# Patient Record
Sex: Female | Born: 1960 | Race: White | Hispanic: No | Marital: Married | Smoking: Current every day smoker
Health system: Southern US, Community
[De-identification: ages and names within clinical notes are randomized; demographics above are authoritative.]

## PROBLEM LIST (undated history)

## (undated) DIAGNOSIS — Z972 Presence of dental prosthetic device (complete) (partial): Secondary | ICD-10-CM

## (undated) DIAGNOSIS — Z7901 Long term (current) use of anticoagulants: Secondary | ICD-10-CM

## (undated) DIAGNOSIS — K08109 Complete loss of teeth, unspecified cause, unspecified class: Secondary | ICD-10-CM

## (undated) DIAGNOSIS — F319 Bipolar disorder, unspecified: Secondary | ICD-10-CM

## (undated) DIAGNOSIS — E079 Disorder of thyroid, unspecified: Secondary | ICD-10-CM

## (undated) DIAGNOSIS — C801 Malignant (primary) neoplasm, unspecified: Secondary | ICD-10-CM

## (undated) DIAGNOSIS — C50919 Malignant neoplasm of unspecified site of unspecified female breast: Secondary | ICD-10-CM

## (undated) DIAGNOSIS — I890 Lymphedema, not elsewhere classified: Secondary | ICD-10-CM

## (undated) DIAGNOSIS — E119 Type 2 diabetes mellitus without complications: Secondary | ICD-10-CM

## (undated) DIAGNOSIS — I38 Endocarditis, valve unspecified: Secondary | ICD-10-CM

## (undated) DIAGNOSIS — I509 Heart failure, unspecified: Secondary | ICD-10-CM

## (undated) DIAGNOSIS — I10 Essential (primary) hypertension: Secondary | ICD-10-CM

## (undated) DIAGNOSIS — R7989 Other specified abnormal findings of blood chemistry: Secondary | ICD-10-CM

## (undated) DIAGNOSIS — K7460 Unspecified cirrhosis of liver: Secondary | ICD-10-CM

## (undated) DIAGNOSIS — R188 Other ascites: Secondary | ICD-10-CM

## (undated) DIAGNOSIS — E785 Hyperlipidemia, unspecified: Secondary | ICD-10-CM

## (undated) DIAGNOSIS — J449 Chronic obstructive pulmonary disease, unspecified: Secondary | ICD-10-CM

## (undated) DIAGNOSIS — M654 Radial styloid tenosynovitis [de Quervain]: Secondary | ICD-10-CM

## (undated) DIAGNOSIS — R4020 Unspecified coma: Secondary | ICD-10-CM

## (undated) DIAGNOSIS — F1411 Cocaine abuse, in remission: Secondary | ICD-10-CM

## (undated) DIAGNOSIS — F32A Depression, unspecified: Secondary | ICD-10-CM

## (undated) DIAGNOSIS — I429 Cardiomyopathy, unspecified: Secondary | ICD-10-CM

## (undated) DIAGNOSIS — K2100 Gastro-esophageal reflux disease with esophagitis, without bleeding: Secondary | ICD-10-CM

## (undated) DIAGNOSIS — J45909 Unspecified asthma, uncomplicated: Secondary | ICD-10-CM

## (undated) DIAGNOSIS — I5020 Unspecified systolic (congestive) heart failure: Secondary | ICD-10-CM

## (undated) DIAGNOSIS — I4510 Unspecified right bundle-branch block: Secondary | ICD-10-CM

## (undated) DIAGNOSIS — N183 Chronic kidney disease, stage 3 unspecified: Secondary | ICD-10-CM

## (undated) DIAGNOSIS — M1990 Unspecified osteoarthritis, unspecified site: Secondary | ICD-10-CM

## (undated) DIAGNOSIS — G4730 Sleep apnea, unspecified: Secondary | ICD-10-CM

## (undated) DIAGNOSIS — I4819 Other persistent atrial fibrillation: Secondary | ICD-10-CM

## (undated) DIAGNOSIS — I2510 Atherosclerotic heart disease of native coronary artery without angina pectoris: Secondary | ICD-10-CM

## (undated) DIAGNOSIS — F1911 Other psychoactive substance abuse, in remission: Secondary | ICD-10-CM

## (undated) DIAGNOSIS — Z923 Personal history of irradiation: Secondary | ICD-10-CM

## (undated) DIAGNOSIS — I5032 Chronic diastolic (congestive) heart failure: Secondary | ICD-10-CM

## (undated) DIAGNOSIS — R55 Syncope and collapse: Secondary | ICD-10-CM

## (undated) DIAGNOSIS — G4700 Insomnia, unspecified: Secondary | ICD-10-CM

## (undated) DIAGNOSIS — K219 Gastro-esophageal reflux disease without esophagitis: Secondary | ICD-10-CM

## (undated) DIAGNOSIS — I700 Atherosclerosis of aorta: Secondary | ICD-10-CM

## (undated) DIAGNOSIS — F329 Major depressive disorder, single episode, unspecified: Secondary | ICD-10-CM

## (undated) DIAGNOSIS — F1910 Other psychoactive substance abuse, uncomplicated: Secondary | ICD-10-CM

## (undated) DIAGNOSIS — F419 Anxiety disorder, unspecified: Secondary | ICD-10-CM

## (undated) DIAGNOSIS — S0990XA Unspecified injury of head, initial encounter: Secondary | ICD-10-CM

## (undated) DIAGNOSIS — M109 Gout, unspecified: Secondary | ICD-10-CM

## (undated) HISTORY — DX: Other psychoactive substance abuse, uncomplicated: F19.10

## (undated) HISTORY — DX: Disorder of thyroid, unspecified: E07.9

## (undated) HISTORY — PX: TUBOPLASTY / TUBOTUBAL ANASTOMOSIS: SUR1392

## (undated) HISTORY — DX: Syncope and collapse: R55

## (undated) HISTORY — PX: CHOLECYSTECTOMY: SHX55

## (undated) HISTORY — DX: Cardiomyopathy, unspecified: I42.9

## (undated) HISTORY — DX: Heart failure, unspecified: I50.9

## (undated) HISTORY — DX: Other persistent atrial fibrillation: I48.19

## (undated) HISTORY — DX: Unspecified systolic (congestive) heart failure: I50.20

## (undated) HISTORY — DX: Atherosclerotic heart disease of native coronary artery without angina pectoris: I25.10

## (undated) HISTORY — DX: Essential (primary) hypertension: I10

## (undated) HISTORY — PX: TUBAL LIGATION: SHX77

## (undated) HISTORY — DX: Other ascites: R18.8

## (undated) HISTORY — DX: Chronic diastolic (congestive) heart failure: I50.32

## (undated) HISTORY — DX: Malignant (primary) neoplasm, unspecified: C80.1

---
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
## 2007-05-02 ENCOUNTER — Ambulatory Visit: Payer: Self-pay | Admitting: Internal Medicine

## 2007-11-06 ENCOUNTER — Ambulatory Visit: Payer: Self-pay | Admitting: Internal Medicine

## 2009-12-31 ENCOUNTER — Ambulatory Visit: Payer: Self-pay

## 2010-09-07 ENCOUNTER — Ambulatory Visit: Payer: Self-pay | Admitting: Surgery

## 2012-03-16 ENCOUNTER — Ambulatory Visit: Payer: Self-pay | Admitting: Emergency Medicine

## 2013-02-13 ENCOUNTER — Ambulatory Visit: Payer: Self-pay | Admitting: Physician Assistant

## 2013-03-21 ENCOUNTER — Ambulatory Visit: Payer: Self-pay | Admitting: Gastroenterology

## 2013-04-10 ENCOUNTER — Ambulatory Visit: Payer: Self-pay | Admitting: Gastroenterology

## 2013-05-02 ENCOUNTER — Ambulatory Visit: Payer: Self-pay | Admitting: Gastroenterology

## 2014-02-19 DIAGNOSIS — F418 Other specified anxiety disorders: Secondary | ICD-10-CM | POA: Diagnosis present

## 2014-02-19 DIAGNOSIS — K219 Gastro-esophageal reflux disease without esophagitis: Secondary | ICD-10-CM | POA: Insufficient documentation

## 2014-03-01 ENCOUNTER — Ambulatory Visit: Payer: Self-pay | Admitting: Internal Medicine

## 2014-03-15 ENCOUNTER — Ambulatory Visit: Payer: Self-pay | Admitting: Internal Medicine

## 2014-03-21 DIAGNOSIS — R921 Mammographic calcification found on diagnostic imaging of breast: Secondary | ICD-10-CM | POA: Insufficient documentation

## 2014-08-26 ENCOUNTER — Other Ambulatory Visit: Payer: Self-pay | Admitting: Internal Medicine

## 2014-08-26 DIAGNOSIS — R921 Mammographic calcification found on diagnostic imaging of breast: Secondary | ICD-10-CM

## 2015-01-14 ENCOUNTER — Ambulatory Visit
Admission: RE | Admit: 2015-01-14 | Discharge: 2015-01-14 | Disposition: A | Discharge status: Home / Self Care | Payer: 59 | Source: Ambulatory Visit | Attending: Internal Medicine | Admitting: Internal Medicine

## 2015-01-14 ENCOUNTER — Other Ambulatory Visit: Payer: Self-pay | Admitting: Internal Medicine

## 2015-01-14 DIAGNOSIS — R921 Mammographic calcification found on diagnostic imaging of breast: Secondary | ICD-10-CM

## 2015-02-28 ENCOUNTER — Other Ambulatory Visit: Payer: Self-pay | Admitting: Internal Medicine

## 2015-02-28 DIAGNOSIS — R921 Mammographic calcification found on diagnostic imaging of breast: Secondary | ICD-10-CM

## 2015-04-04 DIAGNOSIS — G4733 Obstructive sleep apnea (adult) (pediatric): Secondary | ICD-10-CM

## 2015-06-17 ENCOUNTER — Other Ambulatory Visit: Payer: Self-pay | Admitting: Nurse Practitioner

## 2015-06-17 DIAGNOSIS — R1013 Epigastric pain: Secondary | ICD-10-CM

## 2015-06-17 DIAGNOSIS — K219 Gastro-esophageal reflux disease without esophagitis: Secondary | ICD-10-CM

## 2015-06-19 ENCOUNTER — Ambulatory Visit
Admission: RE | Admit: 2015-06-19 | Discharge: 2015-06-19 | Disposition: A | Discharge status: Home / Self Care | Payer: Commercial Managed Care - HMO | Source: Ambulatory Visit | Attending: Nurse Practitioner | Admitting: Nurse Practitioner

## 2015-06-19 DIAGNOSIS — K219 Gastro-esophageal reflux disease without esophagitis: Secondary | ICD-10-CM | POA: Insufficient documentation

## 2015-06-19 DIAGNOSIS — R109 Unspecified abdominal pain: Secondary | ICD-10-CM | POA: Diagnosis present

## 2015-06-19 DIAGNOSIS — R161 Splenomegaly, not elsewhere classified: Secondary | ICD-10-CM | POA: Diagnosis not present

## 2015-06-19 DIAGNOSIS — J90 Pleural effusion, not elsewhere classified: Secondary | ICD-10-CM | POA: Insufficient documentation

## 2015-06-19 DIAGNOSIS — R1013 Epigastric pain: Secondary | ICD-10-CM

## 2015-06-19 HISTORY — DX: Unspecified asthma, uncomplicated: J45.909

## 2015-06-19 MED ORDER — IOPAMIDOL (ISOVUE-300) INJECTION 61%
100.0000 mL | Freq: Once | INTRAVENOUS | Status: AC | PRN
  Administered 2015-06-19: 100 mL via INTRAVENOUS

## 2015-06-27 ENCOUNTER — Encounter: Payer: Self-pay | Admitting: *Deleted

## 2015-07-08 NOTE — Discharge Instructions (Signed)

## 2015-07-09 ENCOUNTER — Ambulatory Visit
Admission: RE | Admit: 2015-07-09 | Discharge: 2015-07-09 | Disposition: A | Discharge status: Home / Self Care | Payer: Commercial Managed Care - HMO | Source: Ambulatory Visit | Attending: Gastroenterology | Admitting: Gastroenterology

## 2015-07-09 ENCOUNTER — Ambulatory Visit: Payer: Commercial Managed Care - HMO | Admitting: Anesthesiology

## 2015-07-09 ENCOUNTER — Encounter
Admission: RE | Disposition: A | Discharge status: Home / Self Care | Payer: Self-pay | Source: Ambulatory Visit | Attending: Gastroenterology

## 2015-07-09 DIAGNOSIS — Z79899 Other long term (current) drug therapy: Secondary | ICD-10-CM | POA: Diagnosis not present

## 2015-07-09 DIAGNOSIS — G4733 Obstructive sleep apnea (adult) (pediatric): Secondary | ICD-10-CM | POA: Diagnosis not present

## 2015-07-09 DIAGNOSIS — R1013 Epigastric pain: Secondary | ICD-10-CM | POA: Insufficient documentation

## 2015-07-09 DIAGNOSIS — F17210 Nicotine dependence, cigarettes, uncomplicated: Secondary | ICD-10-CM | POA: Insufficient documentation

## 2015-07-09 DIAGNOSIS — M1990 Unspecified osteoarthritis, unspecified site: Secondary | ICD-10-CM | POA: Diagnosis not present

## 2015-07-09 DIAGNOSIS — Z7951 Long term (current) use of inhaled steroids: Secondary | ICD-10-CM | POA: Insufficient documentation

## 2015-07-09 DIAGNOSIS — K210 Gastro-esophageal reflux disease with esophagitis: Secondary | ICD-10-CM | POA: Insufficient documentation

## 2015-07-09 DIAGNOSIS — E785 Hyperlipidemia, unspecified: Secondary | ICD-10-CM | POA: Insufficient documentation

## 2015-07-09 DIAGNOSIS — J449 Chronic obstructive pulmonary disease, unspecified: Secondary | ICD-10-CM | POA: Insufficient documentation

## 2015-07-09 DIAGNOSIS — Z7982 Long term (current) use of aspirin: Secondary | ICD-10-CM | POA: Diagnosis not present

## 2015-07-09 DIAGNOSIS — F319 Bipolar disorder, unspecified: Secondary | ICD-10-CM | POA: Diagnosis not present

## 2015-07-09 HISTORY — PX: ESOPHAGOGASTRODUODENOSCOPY: SHX5428

## 2015-07-09 HISTORY — DX: Bipolar disorder, unspecified: F31.9

## 2015-07-09 HISTORY — DX: Presence of dental prosthetic device (complete) (partial): Z97.2

## 2015-07-09 HISTORY — DX: Depression, unspecified: F32.A

## 2015-07-09 HISTORY — DX: Chronic obstructive pulmonary disease, unspecified: J44.9

## 2015-07-09 HISTORY — DX: Anxiety disorder, unspecified: F41.9

## 2015-07-09 HISTORY — DX: Hyperlipidemia, unspecified: E78.5

## 2015-07-09 HISTORY — DX: Unspecified osteoarthritis, unspecified site: M19.90

## 2015-07-09 HISTORY — DX: Major depressive disorder, single episode, unspecified: F32.9

## 2015-07-09 HISTORY — DX: Gastro-esophageal reflux disease without esophagitis: K21.9

## 2015-07-09 HISTORY — DX: Other psychoactive substance abuse, in remission: F19.11

## 2015-07-09 HISTORY — DX: Sleep apnea, unspecified: G47.30

## 2015-07-09 HISTORY — DX: Unspecified coma: R40.20

## 2015-07-09 HISTORY — DX: Unspecified injury of head, initial encounter: S09.90XA

## 2015-07-09 SURGERY — EGD (ESOPHAGOGASTRODUODENOSCOPY)
Anesthesia: Monitor Anesthesia Care | Wound class: Clean Contaminated

## 2015-07-09 MED ORDER — GLYCOPYRROLATE 0.2 MG/ML IJ SOLN
INTRAMUSCULAR | Status: DC | PRN
  Administered 2015-07-09: 0.1 mg via INTRAVENOUS

## 2015-07-09 MED ORDER — PROPOFOL 10 MG/ML IV BOLUS
INTRAVENOUS | Status: DC | PRN
  Administered 2015-07-09: 100 mg via INTRAVENOUS
  Administered 2015-07-09 (×2): 50 mg via INTRAVENOUS

## 2015-07-09 MED ORDER — LIDOCAINE HCL (CARDIAC) 20 MG/ML IV SOLN
INTRAVENOUS | Status: DC | PRN
  Administered 2015-07-09: 40 mg via INTRAVENOUS

## 2015-07-09 MED ORDER — STERILE WATER FOR IRRIGATION IR SOLN
Status: DC | PRN
  Administered 2015-07-09: 100 mL

## 2015-07-09 MED ORDER — ACETAMINOPHEN 325 MG PO TABS
650.0000 mg | ORAL_TABLET | Freq: Once | ORAL | Status: AC
  Administered 2015-07-09: 650 mg via ORAL

## 2015-07-09 MED ORDER — SODIUM CHLORIDE 0.9 % IV SOLN: INTRAVENOUS | Status: DC

## 2015-07-09 MED ORDER — LACTATED RINGERS IV SOLN
INTRAVENOUS | Status: DC
  Administered 2015-07-09: 08:00:00 via INTRAVENOUS

## 2015-07-09 SURGICAL SUPPLY — 41 items
BALLN DILATOR 10-12 8 (BALLOONS)
BALLN DILATOR 12-15 8 (BALLOONS)
BALLN DILATOR 15-18 8 (BALLOONS)
BALLN DILATOR CRE 0-12 8 (BALLOONS)
BALLN DILATOR ESOPH 8 10 CRE (MISCELLANEOUS) IMPLANT
BALLOON DILATOR 12-15 8 (BALLOONS) IMPLANT
BALLOON DILATOR 15-18 8 (BALLOONS) IMPLANT
BALLOON DILATOR CRE 0-12 8 (BALLOONS) IMPLANT
BLOCK BITE 60FR ADLT L/F GRN (MISCELLANEOUS) ×3 IMPLANT
CANISTER SUCT 1200ML W/VALVE (MISCELLANEOUS) ×3 IMPLANT
FCP ESCP3.2XJMB 240X2.8X (MISCELLANEOUS) ×1
FORCEPS BIOP RAD 4 LRG CAP 4 (CUTTING FORCEPS) IMPLANT
FORCEPS BIOP RJ4 240 W/NDL (MISCELLANEOUS) ×2
FORCEPS ESCP3.2XJMB 240X2.8X (MISCELLANEOUS) ×1 IMPLANT
GOWN CVR UNV OPN BCK APRN NK (MISCELLANEOUS) ×1 IMPLANT
GOWN ISOL THUMB LOOP REG UNIV (MISCELLANEOUS) ×2
GOWN STRL REUS W/ TWL LRG LVL3 (GOWN DISPOSABLE) ×1 IMPLANT
GOWN STRL REUS W/TWL LRG LVL3 (GOWN DISPOSABLE) ×2
HEMOCLIP INSTINCT (CLIP) IMPLANT
INJECTOR VARIJECT VIN23 (MISCELLANEOUS) IMPLANT
KIT CO2 TUBING (TUBING) IMPLANT
KIT DEFENDO VALVE AND CONN (KITS) IMPLANT
KIT ENDO PROCEDURE OLY (KITS) ×3 IMPLANT
LIGATOR MULTIBAND 6SHOOTER MBL (MISCELLANEOUS) IMPLANT
MARKER SPOT ENDO TATTOO 5ML (MISCELLANEOUS) IMPLANT
PAD GROUND ADULT SPLIT (MISCELLANEOUS) IMPLANT
SNARE SHORT THROW 13M SML OVAL (MISCELLANEOUS) IMPLANT
SNARE SHORT THROW 30M LRG OVAL (MISCELLANEOUS) IMPLANT
SPOT EX ENDOSCOPIC TATTOO (MISCELLANEOUS)
SUCTION POLY TRAP 4CHAMBER (MISCELLANEOUS) IMPLANT
SYR INFLATION 60ML (SYRINGE) IMPLANT
TRAP SUCTION POLY (MISCELLANEOUS) IMPLANT
TUBING CONN 6MMX3.1M (TUBING)
TUBING SUCTION CONN 0.25 STRL (TUBING) IMPLANT
UNDERPAD 30X60 958B10 (PK) (MISCELLANEOUS) IMPLANT
VALVE BIOPSY ENDO (VALVE) IMPLANT
VARIJECT INJECTOR VIN23 (MISCELLANEOUS)
WATER AUXILLARY (MISCELLANEOUS) IMPLANT
WATER STERILE IRR 250ML POUR (IV SOLUTION) ×3 IMPLANT
WATER STERILE IRR 500ML POUR (IV SOLUTION) IMPLANT
WIRE CRE 18-20MM 8CM F G (MISCELLANEOUS) IMPLANT

## 2015-07-09 NOTE — Anesthesia Procedure Notes (Signed)
Procedure Name: MAC Performed by: Lakesha Levinson Pre-anesthesia Checklist: Patient identified, Emergency Drugs available, Suction available, Patient being monitored and Timeout performed Patient Re-evaluated:Patient Re-evaluated prior to inductionOxygen Delivery Method: Nasal cannula       

## 2015-07-09 NOTE — Op Note (Signed)
[facility] Gastroenterology Patient Name: Ariana Riggs Procedure Date: 07/09/2015 8:22 AM MRN: [id number] Account #: [id number] Date of Birth: 11/02/1960 Admit Type: Outpatient Age: 55 Room: [facility] OR ROOM 01 Gender: Female Note Status: Finalized Procedure:            Upper GI endoscopy Indications:          Epigastric abdominal pain, Follow-up of reflux                        esophagitis Providers:            Lupita Dawn. Candace Cruise, MD Referring MD:         Christena Flake. Raechel Ache, MD (Referring MD) Medicines:            Monitored Anesthesia Care Complications:        No immediate complications. Procedure:            Pre-Anesthesia Assessment:                       - Prior to the procedure, a History and Physical was                        performed, and patient medications, allergies and                        sensitivities were reviewed. The patient's tolerance of                        previous anesthesia was reviewed.                       - The risks and benefits of the procedure and the                        sedation options and risks were discussed with the                        patient. All questions were answered and informed                        consent was obtained.                       - After reviewing the risks and benefits, the patient                        was deemed in satisfactory condition to undergo the                        procedure.                       After obtaining informed consent, the endoscope was                        passed under direct vision. Throughout the procedure,                        the patient's blood pressure, pulse, and oxygen  saturations were monitored continuously. The Olympus                        GIF H180J colonscope [id number]) was introduced                        through the mouth, and advanced to the second part of                        duodenum. The upper GI endoscopy was accomplished               without difficulty. The patient tolerated the procedure                        well. Findings:      The examined esophagus was normal. Biopsies were taken with a cold       forceps for histology.      The entire examined stomach was normal.      The examined duodenum was normal. Impression:           - Normal esophagus. Biopsied.                       - Normal stomach.                       - Normal examined duodenum. Recommendation:       - Discharge patient to home.                       - Observe patient's clinical course.                       - Continue present medications.                       - The findings and recommendations were discussed with                        the patient's family.                       - Await pathology results. Procedure Code(s):    --- Professional ---                       [phone], Esophagogastroduodenoscopy, flexible, transoral;                        with biopsy, single or multiple Diagnosis Code(s):    --- Professional ---                       R10.13, Epigastric pain                       K21.0, Gastro-esophageal reflux disease with esophagitis CPT copyright 2016 American Medical Association. All rights reserved. The codes documented in this report are preliminary and upon coder review may  be revised to meet current compliance requirements. Hulen Luster, MD 07/09/2015 8:29:35 AM This report has been signed electronically. Number of Addenda: 0 Note Initiated On: 07/09/2015 8:22 AM      [facility]

## 2015-07-09 NOTE — H&P (Signed)
  Date of Initial H&P: 06/17/2015  History reviewed, patient examined, no change in status, stable for surgery.

## 2015-07-09 NOTE — Transfer of Care (Signed)
Immediate Anesthesia Transfer of Care Note  Patient: Ariana Riggs  Procedure(s) Performed: Procedure(s) with comments: ESOPHAGOGASTRODUODENOSCOPY (EGD) (N/A) - CPAP  Patient Location: PACU  Anesthesia Type: MAC  Level of Consciousness: awake, alert  and patient cooperative  Airway and Oxygen Therapy: Patient Spontanous Breathing and Patient connected to supplemental oxygen  Post-op Assessment: Post-op Vital signs reviewed, Patient's Cardiovascular Status Stable, Respiratory Function Stable, Patent Airway and No signs of Nausea or vomiting  Post-op Vital Signs: Reviewed and stable  Complications: No apparent anesthesia complications

## 2015-07-09 NOTE — Anesthesia Preprocedure Evaluation (Signed)
Anesthesia Evaluation    Airway Mallampati: II  TM Distance: >3 FB Neck ROM: Full    Dental no notable dental hx. (+) Upper Dentures, Lower Dentures   Pulmonary shortness of breath, asthma , sleep apnea , COPD, Current Smoker,  No home oxygen   Pulmonary exam normal breath sounds clear to auscultation       Cardiovascular Normal cardiovascular exam Rhythm:Regular Rate:Normal  hyperlipidemia   Neuro/Psych Bipolar Disorder    GI/Hepatic GERD  ,  Endo/Other    Renal/GU      Musculoskeletal  (+) Arthritis ,   Abdominal   Peds  Hematology   Anesthesia Other Findings   Reproductive/Obstetrics                             Anesthesia Physical Anesthesia Plan  ASA: III  Anesthesia Plan: MAC   Post-op Pain Management:    Induction: Intravenous  Airway Management Planned:   Additional Equipment:   Intra-op Plan:   Post-operative Plan: Extubation in OR  Informed Consent: I have reviewed the patients History and Physical, chart, labs and discussed the procedure including the risks, benefits and alternatives for the proposed anesthesia with the patient or authorized representative who has indicated his/her understanding and acceptance.   Dental advisory given  Plan Discussed with: CRNA  Anesthesia Plan Comments:         Anesthesia Quick Evaluation

## 2015-07-09 NOTE — Anesthesia Postprocedure Evaluation (Signed)
Anesthesia Post Note  Patient: Ariana Riggs  Procedure(s) Performed: Procedure(s) (LRB): ESOPHAGOGASTRODUODENOSCOPY (EGD) (N/A)  Patient location during evaluation: PACU Anesthesia Type: MAC Level of consciousness: awake and alert Pain management: pain level controlled Vital Signs Assessment: post-procedure vital signs reviewed and stable Respiratory status: spontaneous breathing, nonlabored ventilation, respiratory function stable and patient connected to nasal cannula oxygen Cardiovascular status: stable and blood pressure returned to baseline Anesthetic complications: no    Keirston Saephanh C

## 2015-07-10 ENCOUNTER — Encounter: Payer: Self-pay | Admitting: Gastroenterology

## 2015-07-11 LAB — SURGICAL PATHOLOGY

## 2016-02-26 DIAGNOSIS — G4733 Obstructive sleep apnea (adult) (pediatric): Secondary | ICD-10-CM | POA: Diagnosis not present

## 2016-03-09 ENCOUNTER — Other Ambulatory Visit: Payer: Self-pay | Admitting: Internal Medicine

## 2016-03-09 DIAGNOSIS — Z23 Encounter for immunization: Secondary | ICD-10-CM | POA: Diagnosis not present

## 2016-03-09 DIAGNOSIS — Z79899 Other long term (current) drug therapy: Secondary | ICD-10-CM | POA: Diagnosis not present

## 2016-03-09 DIAGNOSIS — R921 Mammographic calcification found on diagnostic imaging of breast: Secondary | ICD-10-CM

## 2016-03-09 DIAGNOSIS — E782 Mixed hyperlipidemia: Secondary | ICD-10-CM | POA: Diagnosis not present

## 2016-03-24 DIAGNOSIS — R9431 Abnormal electrocardiogram [ECG] [EKG]: Secondary | ICD-10-CM | POA: Diagnosis not present

## 2016-03-30 ENCOUNTER — Other Ambulatory Visit: Payer: Commercial Managed Care - HMO

## 2016-03-30 ENCOUNTER — Ambulatory Visit: Payer: Commercial Managed Care - HMO | Attending: Internal Medicine

## 2016-09-07 DIAGNOSIS — K219 Gastro-esophageal reflux disease without esophagitis: Secondary | ICD-10-CM | POA: Diagnosis not present

## 2016-09-07 DIAGNOSIS — R7309 Other abnormal glucose: Secondary | ICD-10-CM | POA: Diagnosis not present

## 2016-09-07 DIAGNOSIS — J449 Chronic obstructive pulmonary disease, unspecified: Secondary | ICD-10-CM | POA: Diagnosis not present

## 2016-09-07 DIAGNOSIS — E782 Mixed hyperlipidemia: Secondary | ICD-10-CM | POA: Diagnosis not present

## 2016-09-07 DIAGNOSIS — Z79899 Other long term (current) drug therapy: Secondary | ICD-10-CM | POA: Diagnosis not present

## 2016-09-21 DIAGNOSIS — G4733 Obstructive sleep apnea (adult) (pediatric): Secondary | ICD-10-CM | POA: Diagnosis not present

## 2016-10-07 ENCOUNTER — Other Ambulatory Visit: Payer: Self-pay | Admitting: Specialist

## 2016-10-07 DIAGNOSIS — J449 Chronic obstructive pulmonary disease, unspecified: Secondary | ICD-10-CM | POA: Diagnosis not present

## 2016-10-07 DIAGNOSIS — R0602 Shortness of breath: Secondary | ICD-10-CM | POA: Diagnosis not present

## 2016-10-07 DIAGNOSIS — R05 Cough: Secondary | ICD-10-CM | POA: Diagnosis not present

## 2016-10-07 DIAGNOSIS — J849 Interstitial pulmonary disease, unspecified: Secondary | ICD-10-CM

## 2016-10-07 DIAGNOSIS — G4733 Obstructive sleep apnea (adult) (pediatric): Secondary | ICD-10-CM | POA: Diagnosis not present

## 2016-10-14 ENCOUNTER — Ambulatory Visit
Admission: RE | Admit: 2016-10-14 | Discharge: 2016-10-14 | Disposition: A | Discharge status: Home / Self Care | Payer: Commercial Managed Care - HMO | Source: Ambulatory Visit | Attending: Specialist | Admitting: Specialist

## 2016-10-14 DIAGNOSIS — R590 Localized enlarged lymph nodes: Secondary | ICD-10-CM | POA: Diagnosis not present

## 2016-10-14 DIAGNOSIS — J432 Centrilobular emphysema: Secondary | ICD-10-CM | POA: Insufficient documentation

## 2016-10-14 DIAGNOSIS — R161 Splenomegaly, not elsewhere classified: Secondary | ICD-10-CM | POA: Diagnosis not present

## 2016-10-14 DIAGNOSIS — J439 Emphysema, unspecified: Secondary | ICD-10-CM | POA: Diagnosis not present

## 2016-10-14 DIAGNOSIS — I700 Atherosclerosis of aorta: Secondary | ICD-10-CM | POA: Insufficient documentation

## 2016-10-14 DIAGNOSIS — J849 Interstitial pulmonary disease, unspecified: Secondary | ICD-10-CM | POA: Diagnosis not present

## 2016-10-14 DIAGNOSIS — I2510 Atherosclerotic heart disease of native coronary artery without angina pectoris: Secondary | ICD-10-CM | POA: Diagnosis not present

## 2016-10-14 DIAGNOSIS — R0602 Shortness of breath: Secondary | ICD-10-CM | POA: Diagnosis not present

## 2016-10-25 ENCOUNTER — Other Ambulatory Visit: Payer: Self-pay | Admitting: Specialist

## 2016-10-25 DIAGNOSIS — J849 Interstitial pulmonary disease, unspecified: Secondary | ICD-10-CM

## 2016-12-21 DIAGNOSIS — Z23 Encounter for immunization: Secondary | ICD-10-CM | POA: Diagnosis not present

## 2017-04-19 ENCOUNTER — Other Ambulatory Visit: Payer: Self-pay | Admitting: Internal Medicine

## 2017-04-19 DIAGNOSIS — R921 Mammographic calcification found on diagnostic imaging of breast: Secondary | ICD-10-CM

## 2017-04-19 DIAGNOSIS — R7302 Impaired glucose tolerance (oral): Secondary | ICD-10-CM | POA: Diagnosis not present

## 2017-04-19 DIAGNOSIS — E782 Mixed hyperlipidemia: Secondary | ICD-10-CM | POA: Diagnosis not present

## 2017-04-19 DIAGNOSIS — Z79899 Other long term (current) drug therapy: Secondary | ICD-10-CM | POA: Diagnosis not present

## 2017-04-19 DIAGNOSIS — R748 Abnormal levels of other serum enzymes: Secondary | ICD-10-CM | POA: Diagnosis not present

## 2017-04-19 DIAGNOSIS — Z1231 Encounter for screening mammogram for malignant neoplasm of breast: Secondary | ICD-10-CM

## 2017-07-18 DIAGNOSIS — Z79899 Other long term (current) drug therapy: Secondary | ICD-10-CM | POA: Diagnosis not present

## 2017-09-27 DIAGNOSIS — J449 Chronic obstructive pulmonary disease, unspecified: Secondary | ICD-10-CM | POA: Diagnosis not present

## 2017-09-27 DIAGNOSIS — Z79899 Other long term (current) drug therapy: Secondary | ICD-10-CM | POA: Diagnosis not present

## 2017-09-27 DIAGNOSIS — E782 Mixed hyperlipidemia: Secondary | ICD-10-CM | POA: Diagnosis not present

## 2018-01-03 ENCOUNTER — Emergency Department: Payer: Commercial Managed Care - HMO

## 2018-01-03 ENCOUNTER — Other Ambulatory Visit: Payer: Self-pay

## 2018-01-03 ENCOUNTER — Encounter: Payer: Self-pay | Admitting: Emergency Medicine

## 2018-01-03 ENCOUNTER — Emergency Department
Admission: EM | Admit: 2018-01-03 | Discharge: 2018-01-03 | Disposition: A | Discharge status: Home / Self Care | Payer: Commercial Managed Care - HMO | Attending: Emergency Medicine | Admitting: Emergency Medicine

## 2018-01-03 DIAGNOSIS — K760 Fatty (change of) liver, not elsewhere classified: Secondary | ICD-10-CM | POA: Diagnosis not present

## 2018-01-03 DIAGNOSIS — R1013 Epigastric pain: Secondary | ICD-10-CM | POA: Insufficient documentation

## 2018-01-03 DIAGNOSIS — F17210 Nicotine dependence, cigarettes, uncomplicated: Secondary | ICD-10-CM | POA: Insufficient documentation

## 2018-01-03 DIAGNOSIS — Z7982 Long term (current) use of aspirin: Secondary | ICD-10-CM | POA: Insufficient documentation

## 2018-01-03 DIAGNOSIS — J441 Chronic obstructive pulmonary disease with (acute) exacerbation: Secondary | ICD-10-CM

## 2018-01-03 DIAGNOSIS — R188 Other ascites: Secondary | ICD-10-CM | POA: Diagnosis not present

## 2018-01-03 DIAGNOSIS — J8410 Pulmonary fibrosis, unspecified: Secondary | ICD-10-CM | POA: Diagnosis not present

## 2018-01-03 DIAGNOSIS — Z79899 Other long term (current) drug therapy: Secondary | ICD-10-CM | POA: Diagnosis not present

## 2018-01-03 LAB — BASIC METABOLIC PANEL
ANION GAP: 10 (ref 5–15)
BUN: 14 mg/dL (ref 6–20)
CHLORIDE: 108 mmol/L (ref 98–111)
CO2: 23 mmol/L (ref 22–32)
Calcium: 9 mg/dL (ref 8.9–10.3)
Creatinine, Ser: 0.86 mg/dL (ref 0.44–1.00)
GFR calc Af Amer: >60 mL/min (ref >60)
GFR calc non Af Amer: >60 mL/min (ref >60)
Glucose, Bld: 149 mg/dL — ABNORMAL HIGH (ref 70–99)
POTASSIUM: 4.2 mmol/L (ref 3.5–5.1)
Sodium: 141 mmol/L (ref 135–145)

## 2018-01-03 LAB — CBC
HCT: 37.3 % (ref 36.0–46.0)
HEMOGLOBIN: 12.9 g/dL (ref 12.0–15.0)
MCH: 34.1 pg — AB (ref 26.0–34.0)
MCHC: 34.6 g/dL (ref 30.0–36.0)
MCV: 98.7 fL (ref 80.0–100.0)
Platelets: 128 10*3/uL — ABNORMAL LOW (ref 150–400)
RBC: 3.78 MIL/uL — AB (ref 3.87–5.11)
RDW: 13.9 % (ref 11.5–15.5)
WBC: 5.7 10*3/uL (ref 4.0–10.5)
nRBC: 0.4 % — ABNORMAL HIGH (ref 0.0–0.2)

## 2018-01-03 LAB — LIPASE, BLOOD: LIPASE: 42 U/L (ref 11–51)

## 2018-01-03 LAB — TROPONIN I: Troponin I: <0.03 ng/mL (ref <0.03)

## 2018-01-03 LAB — HEPATIC FUNCTION PANEL
ALBUMIN: 4.5 g/dL (ref 3.5–5.0)
ALK PHOS: 113 U/L (ref 38–126)
ALT: 37 U/L (ref 0–44)
AST: 41 U/L (ref 15–41)
BILIRUBIN INDIRECT: 0.5 mg/dL (ref 0.3–0.9)
Bilirubin, Direct: 0.2 mg/dL (ref 0.0–0.2)
TOTAL PROTEIN: 7.7 g/dL (ref 6.5–8.1)
Total Bilirubin: 0.7 mg/dL (ref 0.3–1.2)

## 2018-01-03 LAB — ETHANOL: Alcohol, Ethyl (B): <10 mg/dL (ref <10)

## 2018-01-03 MED ORDER — FENTANYL CITRATE (PF) 100 MCG/2ML IJ SOLN
50.0000 ug | Freq: Once | INTRAMUSCULAR | Status: AC
  Administered 2018-01-03: 50 ug via INTRAVENOUS
  Filled 2018-01-03: qty 2

## 2018-01-03 MED ORDER — PREDNISONE 10 MG PO TABS: 10.0000 mg | ORAL_TABLET | Freq: Every day | ORAL | 0 refills | Status: DC

## 2018-01-03 MED ORDER — ALBUTEROL SULFATE (2.5 MG/3ML) 0.083% IN NEBU
5.0000 mg | INHALATION_SOLUTION | Freq: Once | RESPIRATORY_TRACT | Status: AC
  Administered 2018-01-03: 5 mg via RESPIRATORY_TRACT

## 2018-01-03 MED ORDER — SODIUM CHLORIDE 0.9 % IV BOLUS
500.0000 mL | Freq: Once | INTRAVENOUS | Status: AC
  Administered 2018-01-03: 500 mL via INTRAVENOUS

## 2018-01-03 MED ORDER — ONDANSETRON HCL 4 MG/2ML IJ SOLN
4.0000 mg | Freq: Once | INTRAMUSCULAR | Status: AC
  Administered 2018-01-03: 4 mg via INTRAVENOUS
  Filled 2018-01-03: qty 2

## 2018-01-03 MED ORDER — TRAMADOL HCL 50 MG PO TABS: 50.0000 mg | ORAL_TABLET | Freq: Four times a day (QID) | ORAL | 0 refills | Status: DC | PRN

## 2018-01-03 MED ORDER — METHYLPREDNISOLONE SODIUM SUCC 125 MG IJ SOLR
125.0000 mg | Freq: Once | INTRAMUSCULAR | Status: AC
  Administered 2018-01-03: 125 mg via INTRAVENOUS
  Filled 2018-01-03: qty 2

## 2018-01-03 MED ORDER — IOPAMIDOL (ISOVUE-300) INJECTION 61%
100.0000 mL | Freq: Once | INTRAVENOUS | Status: AC | PRN
  Administered 2018-01-03: 100 mL via INTRAVENOUS

## 2018-01-03 MED ORDER — IOPAMIDOL (ISOVUE-300) INJECTION 61%
30.0000 mL | Freq: Once | INTRAVENOUS | Status: AC
  Administered 2018-01-03: 30 mL via ORAL

## 2018-01-03 MED ORDER — ALBUTEROL SULFATE (2.5 MG/3ML) 0.083% IN NEBU
INHALATION_SOLUTION | RESPIRATORY_TRACT | Status: AC
  Administered 2018-01-03: 5 mg via RESPIRATORY_TRACT
  Filled 2018-01-03: qty 6

## 2018-01-03 NOTE — ED Triage Notes (Signed)
Pt was sent by PCP with concerns over worsening shortness of breath. Pt's breathing labored in triage. Wheezes heard on ausculation. Pt does report upper abdominal pain. Pt states she has been short of breath since Sunday and also reports productive cough.

## 2018-01-03 NOTE — ED Notes (Signed)
Patient transported to CT 

## 2018-01-03 NOTE — ED Provider Notes (Signed)
-----------------------------------------   10:00 PM on 01/03/2018 -----------------------------------------  Patient assumed from Dr. Burlene Arnt.  CT scans essentially negative for acute abnormality.  I have personally seen and evaluated the patient, she continues to have moderate wheeze.  I discussed with the patient admission to the hospital, she does not wish to be admitted at this time.  I do believe the patient would benefit from a taper of steroids.  I also believe the patient would benefit from using her albuterol inhaler every 2-4 hours at home.  Patient agreeable to plan of care.  I discussed very strict return precautions, with a low threshold to return to the emergency department.  Patient agreeable to plan.   Harvest Dark, MD 01/03/18 2202
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
## 2018-02-27 ENCOUNTER — Ambulatory Visit
Admission: EM | Admit: 2018-02-27 | Discharge: 2018-02-27 | Disposition: A | Discharge status: Home / Self Care | Payer: 59 | Attending: Family Medicine | Admitting: Family Medicine

## 2018-02-27 DIAGNOSIS — M10072 Idiopathic gout, left ankle and foot: Secondary | ICD-10-CM | POA: Insufficient documentation

## 2018-02-27 MED ORDER — HYDROCODONE-ACETAMINOPHEN 5-325 MG PO TABS: ORAL_TABLET | ORAL | 0 refills | Status: DC

## 2018-02-27 MED ORDER — PREDNISONE 20 MG PO TABS: ORAL_TABLET | ORAL | 0 refills | Status: DC
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
## 2018-02-27 NOTE — ED Triage Notes (Signed)
Pt having left foot pain for 3 days. Hurts to bare pressure and is red and swollen mostly on the top of her foot and big toe. Sensitive to anything rubbing on it.

## 2018-03-16 DIAGNOSIS — G4733 Obstructive sleep apnea (adult) (pediatric): Secondary | ICD-10-CM | POA: Diagnosis not present

## 2018-03-16 DIAGNOSIS — J441 Chronic obstructive pulmonary disease with (acute) exacerbation: Secondary | ICD-10-CM | POA: Diagnosis not present

## 2018-03-16 DIAGNOSIS — J449 Chronic obstructive pulmonary disease, unspecified: Secondary | ICD-10-CM | POA: Diagnosis not present

## 2018-03-27 ENCOUNTER — Other Ambulatory Visit: Payer: Self-pay | Admitting: Specialist

## 2018-03-27 ENCOUNTER — Ambulatory Visit
Admission: RE | Admit: 2018-03-27 | Discharge: 2018-03-27 | Disposition: A | Discharge status: Home / Self Care | Payer: 59 | Source: Ambulatory Visit | Attending: Specialist | Admitting: Specialist

## 2018-03-27 ENCOUNTER — Other Ambulatory Visit: Payer: Self-pay

## 2018-03-27 ENCOUNTER — Other Ambulatory Visit
Admission: RE | Admit: 2018-03-27 | Discharge: 2018-03-27 | Disposition: A | Discharge status: Home / Self Care | Payer: 59 | Source: Ambulatory Visit | Attending: Specialist | Admitting: Specialist

## 2018-03-27 ENCOUNTER — Inpatient Hospital Stay
Admission: AD | Admit: 2018-03-27 | Discharge: 2018-04-05 | DRG: 291 | Disposition: A | Discharge status: Home Health | Payer: 59 | Source: Ambulatory Visit | Attending: Internal Medicine | Admitting: Internal Medicine

## 2018-03-27 DIAGNOSIS — J189 Pneumonia, unspecified organism: Secondary | ICD-10-CM | POA: Diagnosis present

## 2018-03-27 DIAGNOSIS — Z888 Allergy status to other drugs, medicaments and biological substances status: Secondary | ICD-10-CM | POA: Diagnosis not present

## 2018-03-27 DIAGNOSIS — I509 Heart failure, unspecified: Secondary | ICD-10-CM

## 2018-03-27 DIAGNOSIS — I480 Paroxysmal atrial fibrillation: Secondary | ICD-10-CM

## 2018-03-27 DIAGNOSIS — I429 Cardiomyopathy, unspecified: Secondary | ICD-10-CM | POA: Diagnosis present

## 2018-03-27 DIAGNOSIS — I420 Dilated cardiomyopathy: Secondary | ICD-10-CM | POA: Diagnosis not present

## 2018-03-27 DIAGNOSIS — J441 Chronic obstructive pulmonary disease with (acute) exacerbation: Secondary | ICD-10-CM | POA: Diagnosis present

## 2018-03-27 DIAGNOSIS — R140 Abdominal distension (gaseous): Secondary | ICD-10-CM | POA: Insufficient documentation

## 2018-03-27 DIAGNOSIS — I248 Other forms of acute ischemic heart disease: Secondary | ICD-10-CM | POA: Diagnosis present

## 2018-03-27 DIAGNOSIS — J90 Pleural effusion, not elsewhere classified: Secondary | ICD-10-CM | POA: Diagnosis not present

## 2018-03-27 DIAGNOSIS — N3289 Other specified disorders of bladder: Secondary | ICD-10-CM | POA: Diagnosis not present

## 2018-03-27 DIAGNOSIS — I472 Ventricular tachycardia: Secondary | ICD-10-CM | POA: Diagnosis not present

## 2018-03-27 DIAGNOSIS — I361 Nonrheumatic tricuspid (valve) insufficiency: Secondary | ICD-10-CM | POA: Diagnosis not present

## 2018-03-27 DIAGNOSIS — M1990 Unspecified osteoarthritis, unspecified site: Secondary | ICD-10-CM | POA: Diagnosis present

## 2018-03-27 DIAGNOSIS — K219 Gastro-esophageal reflux disease without esophagitis: Secondary | ICD-10-CM | POA: Diagnosis present

## 2018-03-27 DIAGNOSIS — I340 Nonrheumatic mitral (valve) insufficiency: Secondary | ICD-10-CM | POA: Diagnosis not present

## 2018-03-27 DIAGNOSIS — G4733 Obstructive sleep apnea (adult) (pediatric): Secondary | ICD-10-CM | POA: Diagnosis present

## 2018-03-27 DIAGNOSIS — R002 Palpitations: Secondary | ICD-10-CM | POA: Diagnosis not present

## 2018-03-27 DIAGNOSIS — R0602 Shortness of breath: Secondary | ICD-10-CM | POA: Insufficient documentation

## 2018-03-27 DIAGNOSIS — I959 Hypotension, unspecified: Secondary | ICD-10-CM | POA: Diagnosis not present

## 2018-03-27 DIAGNOSIS — R188 Other ascites: Secondary | ICD-10-CM | POA: Diagnosis not present

## 2018-03-27 DIAGNOSIS — J810 Acute pulmonary edema: Secondary | ICD-10-CM | POA: Diagnosis not present

## 2018-03-27 DIAGNOSIS — R635 Abnormal weight gain: Secondary | ICD-10-CM

## 2018-03-27 DIAGNOSIS — M109 Gout, unspecified: Secondary | ICD-10-CM | POA: Diagnosis present

## 2018-03-27 DIAGNOSIS — I272 Pulmonary hypertension, unspecified: Secondary | ICD-10-CM | POA: Diagnosis not present

## 2018-03-27 DIAGNOSIS — E876 Hypokalemia: Secondary | ICD-10-CM | POA: Diagnosis not present

## 2018-03-27 DIAGNOSIS — I5041 Acute combined systolic (congestive) and diastolic (congestive) heart failure: Secondary | ICD-10-CM | POA: Diagnosis not present

## 2018-03-27 DIAGNOSIS — R059 Cough, unspecified: Secondary | ICD-10-CM

## 2018-03-27 DIAGNOSIS — I4510 Unspecified right bundle-branch block: Secondary | ICD-10-CM | POA: Diagnosis present

## 2018-03-27 DIAGNOSIS — I110 Hypertensive heart disease with heart failure: Secondary | ICD-10-CM | POA: Diagnosis not present

## 2018-03-27 DIAGNOSIS — F419 Anxiety disorder, unspecified: Secondary | ICD-10-CM | POA: Diagnosis present

## 2018-03-27 DIAGNOSIS — Z79899 Other long term (current) drug therapy: Secondary | ICD-10-CM

## 2018-03-27 DIAGNOSIS — Z7982 Long term (current) use of aspirin: Secondary | ICD-10-CM

## 2018-03-27 DIAGNOSIS — I4891 Unspecified atrial fibrillation: Secondary | ICD-10-CM

## 2018-03-27 DIAGNOSIS — Z978 Presence of other specified devices: Secondary | ICD-10-CM

## 2018-03-27 DIAGNOSIS — N179 Acute kidney failure, unspecified: Secondary | ICD-10-CM | POA: Diagnosis not present

## 2018-03-27 DIAGNOSIS — F319 Bipolar disorder, unspecified: Secondary | ICD-10-CM | POA: Diagnosis present

## 2018-03-27 DIAGNOSIS — Z79891 Long term (current) use of opiate analgesic: Secondary | ICD-10-CM

## 2018-03-27 DIAGNOSIS — F17210 Nicotine dependence, cigarettes, uncomplicated: Secondary | ICD-10-CM | POA: Diagnosis present

## 2018-03-27 DIAGNOSIS — Z9889 Other specified postprocedural states: Secondary | ICD-10-CM

## 2018-03-27 DIAGNOSIS — Z136 Encounter for screening for cardiovascular disorders: Secondary | ICD-10-CM | POA: Diagnosis not present

## 2018-03-27 DIAGNOSIS — J9811 Atelectasis: Secondary | ICD-10-CM | POA: Diagnosis not present

## 2018-03-27 DIAGNOSIS — R339 Retention of urine, unspecified: Secondary | ICD-10-CM | POA: Diagnosis not present

## 2018-03-27 DIAGNOSIS — I5021 Acute systolic (congestive) heart failure: Secondary | ICD-10-CM | POA: Diagnosis not present

## 2018-03-27 DIAGNOSIS — Z6838 Body mass index (BMI) 38.0-38.9, adult: Secondary | ICD-10-CM

## 2018-03-27 DIAGNOSIS — E785 Hyperlipidemia, unspecified: Secondary | ICD-10-CM | POA: Diagnosis present

## 2018-03-27 DIAGNOSIS — Z882 Allergy status to sulfonamides status: Secondary | ICD-10-CM | POA: Diagnosis not present

## 2018-03-27 DIAGNOSIS — Z7689 Persons encountering health services in other specified circumstances: Secondary | ICD-10-CM

## 2018-03-27 DIAGNOSIS — R05 Cough: Secondary | ICD-10-CM

## 2018-03-27 DIAGNOSIS — R600 Localized edema: Secondary | ICD-10-CM | POA: Diagnosis not present

## 2018-03-27 DIAGNOSIS — I5023 Acute on chronic systolic (congestive) heart failure: Secondary | ICD-10-CM | POA: Diagnosis not present

## 2018-03-27 DIAGNOSIS — J418 Mixed simple and mucopurulent chronic bronchitis: Secondary | ICD-10-CM | POA: Diagnosis not present

## 2018-03-27 DIAGNOSIS — J432 Centrilobular emphysema: Secondary | ICD-10-CM | POA: Diagnosis not present

## 2018-03-27 DIAGNOSIS — I5043 Acute on chronic combined systolic (congestive) and diastolic (congestive) heart failure: Secondary | ICD-10-CM | POA: Diagnosis not present

## 2018-03-27 DIAGNOSIS — Z880 Allergy status to penicillin: Secondary | ICD-10-CM

## 2018-03-27 DIAGNOSIS — R0603 Acute respiratory distress: Secondary | ICD-10-CM | POA: Diagnosis not present

## 2018-03-27 DIAGNOSIS — R079 Chest pain, unspecified: Secondary | ICD-10-CM | POA: Diagnosis not present

## 2018-03-27 LAB — PROTIME-INR
INR: 1.32
Prothrombin Time: 16.2 seconds — ABNORMAL HIGH (ref 11.4–15.2)

## 2018-03-27 LAB — COMPREHENSIVE METABOLIC PANEL
ALT: 42 U/L (ref 0–44)
AST: 42 U/L — ABNORMAL HIGH (ref 15–41)
Albumin: 3.6 g/dL (ref 3.5–5.0)
Alkaline Phosphatase: 118 U/L (ref 38–126)
Anion gap: 8 (ref 5–15)
BUN: 16 mg/dL (ref 6–20)
CO2: 22 mmol/L (ref 22–32)
Calcium: 8.6 mg/dL — ABNORMAL LOW (ref 8.9–10.3)
Chloride: 105 mmol/L (ref 98–111)
Creatinine, Ser: 0.83 mg/dL (ref 0.44–1.00)
GFR calc Af Amer: >60 mL/min (ref >60)
GFR calc non Af Amer: >60 mL/min (ref >60)
Glucose, Bld: 107 mg/dL — ABNORMAL HIGH (ref 70–99)
POTASSIUM: 5.4 mmol/L — AB (ref 3.5–5.1)
Sodium: 135 mmol/L (ref 135–145)
Total Bilirubin: 2.2 mg/dL — ABNORMAL HIGH (ref 0.3–1.2)
Total Protein: 6.8 g/dL (ref 6.5–8.1)

## 2018-03-27 LAB — CBC WITH DIFFERENTIAL/PLATELET
Abs Immature Granulocytes: 0.02 10*3/uL (ref 0.00–0.07)
Basophils Absolute: 0 10*3/uL (ref 0.0–0.1)
Basophils Relative: 0 %
Eosinophils Absolute: 0 10*3/uL (ref 0.0–0.5)
Eosinophils Relative: 0 %
HCT: 46.2 % — ABNORMAL HIGH (ref 36.0–46.0)
Hemoglobin: 15 g/dL (ref 12.0–15.0)
Immature Granulocytes: 0 %
Lymphocytes Relative: 24 %
Lymphs Abs: 2.4 10*3/uL (ref 0.7–4.0)
MCH: 32.5 pg (ref 26.0–34.0)
MCHC: 32.5 g/dL (ref 30.0–36.0)
MCV: 100.2 fL — ABNORMAL HIGH (ref 80.0–100.0)
Monocytes Absolute: 0.7 10*3/uL (ref 0.1–1.0)
Monocytes Relative: 7 %
NRBC: 0 % (ref 0.0–0.2)
Neutro Abs: 6.9 10*3/uL (ref 1.7–7.7)
Neutrophils Relative %: 69 %
Platelets: 236 10*3/uL (ref 150–400)
RBC: 4.61 MIL/uL (ref 3.87–5.11)
RDW: 15.4 % (ref 11.5–15.5)
WBC: 10 10*3/uL (ref 4.0–10.5)

## 2018-03-27 LAB — BRAIN NATRIURETIC PEPTIDE: B Natriuretic Peptide: 212 pg/mL — ABNORMAL HIGH (ref 0.0–100.0)

## 2018-03-27 LAB — APTT: aPTT: 31 seconds (ref 24–36)

## 2018-03-27 LAB — MAGNESIUM: Magnesium: 2.4 mg/dL (ref 1.7–2.4)

## 2018-03-27 LAB — FIBRIN DERIVATIVES D-DIMER (ARMC ONLY): FIBRIN DERIVATIVES D-DIMER (ARMC): 3126.18 ng{FEU}/mL — AB (ref 0.00–499.00)

## 2018-03-27 LAB — POCT I-STAT CREATININE: CREATININE: 1 mg/dL (ref 0.44–1.00)

## 2018-03-27 MED ORDER — BISACODYL 5 MG PO TBEC
5.0000 mg | DELAYED_RELEASE_TABLET | Freq: Every day | ORAL | Status: DC | PRN
  Administered 2018-03-29: 5 mg via ORAL
  Filled 2018-03-27: qty 1

## 2018-03-27 MED ORDER — ACETAMINOPHEN 325 MG PO TABS: 650.0000 mg | ORAL_TABLET | Freq: Four times a day (QID) | ORAL | Status: DC | PRN

## 2018-03-27 MED ORDER — ACETAMINOPHEN 650 MG RE SUPP: 650.0000 mg | Freq: Four times a day (QID) | RECTAL | Status: DC | PRN

## 2018-03-27 MED ORDER — GUAIFENESIN-DM 100-10 MG/5ML PO SYRP
5.0000 mL | ORAL_SOLUTION | ORAL | Status: DC | PRN
  Filled 2018-03-27: qty 5

## 2018-03-27 MED ORDER — ONDANSETRON HCL 4 MG PO TABS
4.0000 mg | ORAL_TABLET | Freq: Four times a day (QID) | ORAL | Status: DC | PRN
  Administered 2018-03-29: 4 mg via ORAL
  Filled 2018-03-27: qty 1

## 2018-03-27 MED ORDER — PANTOPRAZOLE SODIUM 40 MG PO TBEC
40.0000 mg | DELAYED_RELEASE_TABLET | Freq: Every day | ORAL | Status: DC
  Administered 2018-03-28 – 2018-04-05 (×9): 40 mg via ORAL
  Filled 2018-03-27 (×9): qty 1

## 2018-03-27 MED ORDER — ASPIRIN EC 81 MG PO TBEC
81.0000 mg | DELAYED_RELEASE_TABLET | Freq: Every day | ORAL | Status: DC
  Administered 2018-03-28 – 2018-03-29 (×2): 81 mg via ORAL
  Filled 2018-03-27 (×4): qty 1

## 2018-03-27 MED ORDER — DILTIAZEM HCL 25 MG/5ML IV SOLN
10.0000 mg | Freq: Once | INTRAVENOUS | Status: AC
  Administered 2018-03-27: 10 mg via INTRAVENOUS
  Filled 2018-03-27: qty 5

## 2018-03-27 MED ORDER — DILTIAZEM LOAD VIA INFUSION: 10.0000 mg | INTRAVENOUS | Status: DC

## 2018-03-27 MED ORDER — HEPARIN BOLUS VIA INFUSION
4200.0000 [IU] | Freq: Once | INTRAVENOUS | Status: AC
  Administered 2018-03-27: 4200 [IU] via INTRAVENOUS
  Filled 2018-03-27: qty 4200

## 2018-03-27 MED ORDER — HYDROCODONE-ACETAMINOPHEN 5-325 MG PO TABS
1.0000 | ORAL_TABLET | ORAL | Status: DC | PRN
  Filled 2018-03-27: qty 2

## 2018-03-27 MED ORDER — NICOTINE 14 MG/24HR TD PT24
14.0000 mg | MEDICATED_PATCH | Freq: Every day | TRANSDERMAL | Status: DC
  Administered 2018-03-30: 14 mg via TRANSDERMAL
  Filled 2018-03-27 (×7): qty 1

## 2018-03-27 MED ORDER — LISINOPRIL 5 MG PO TABS: 5.0000 mg | ORAL_TABLET | Freq: Every day | ORAL | Status: DC

## 2018-03-27 MED ORDER — FUROSEMIDE 10 MG/ML IJ SOLN
40.0000 mg | Freq: Two times a day (BID) | INTRAMUSCULAR | Status: DC
  Administered 2018-03-27: 40 mg via INTRAVENOUS
  Filled 2018-03-27: qty 4

## 2018-03-27 MED ORDER — METOPROLOL TARTRATE 25 MG PO TABS
25.0000 mg | ORAL_TABLET | Freq: Two times a day (BID) | ORAL | Status: DC
  Administered 2018-03-27: 25 mg via ORAL
  Filled 2018-03-27: qty 1

## 2018-03-27 MED ORDER — BUPROPION HCL ER (SR) 150 MG PO TB12
150.0000 mg | ORAL_TABLET | Freq: Every day | ORAL | Status: DC
  Administered 2018-03-29 – 2018-04-05 (×8): 150 mg via ORAL
  Filled 2018-03-27 (×11): qty 1

## 2018-03-27 MED ORDER — SENNOSIDES-DOCUSATE SODIUM 8.6-50 MG PO TABS: 1.0000 | ORAL_TABLET | Freq: Every evening | ORAL | Status: DC | PRN

## 2018-03-27 MED ORDER — SODIUM CHLORIDE 0.9 % IV SOLN: 250.0000 mL | INTRAVENOUS | Status: DC | PRN

## 2018-03-27 MED ORDER — SUCRALFATE 1 G PO TABS
1.0000 g | ORAL_TABLET | Freq: Four times a day (QID) | ORAL | Status: DC
  Administered 2018-03-27 – 2018-04-05 (×33): 1 g via ORAL
  Filled 2018-03-27 (×34): qty 1

## 2018-03-27 MED ORDER — ALBUTEROL SULFATE (2.5 MG/3ML) 0.083% IN NEBU: 2.5000 mg | INHALATION_SOLUTION | RESPIRATORY_TRACT | Status: DC | PRN

## 2018-03-27 MED ORDER — CITALOPRAM HYDROBROMIDE 20 MG PO TABS
40.0000 mg | ORAL_TABLET | Freq: Every day | ORAL | Status: DC
  Administered 2018-03-28 – 2018-04-05 (×9): 40 mg via ORAL
  Filled 2018-03-27 (×10): qty 2

## 2018-03-27 MED ORDER — BUDESONIDE 0.25 MG/2ML IN SUSP
0.2500 mg | Freq: Two times a day (BID) | RESPIRATORY_TRACT | Status: DC
  Administered 2018-03-27 – 2018-04-04 (×16): 0.25 mg via RESPIRATORY_TRACT
  Filled 2018-03-27 (×16): qty 2

## 2018-03-27 MED ORDER — ONDANSETRON HCL 4 MG/2ML IJ SOLN
4.0000 mg | Freq: Four times a day (QID) | INTRAMUSCULAR | Status: DC | PRN
  Administered 2018-04-02 – 2018-04-04 (×5): 4 mg via INTRAVENOUS
  Filled 2018-03-27 (×6): qty 2

## 2018-03-27 MED ORDER — SODIUM CHLORIDE 0.9% FLUSH
3.0000 mL | Freq: Two times a day (BID) | INTRAVENOUS | Status: DC
  Administered 2018-03-28 – 2018-04-05 (×16): 3 mL via INTRAVENOUS

## 2018-03-27 MED ORDER — DILTIAZEM LOAD VIA INFUSION
10.0000 mg | Freq: Once | INTRAVENOUS | Status: DC
  Filled 2018-03-27: qty 10

## 2018-03-27 MED ORDER — IOPAMIDOL (ISOVUE-370) INJECTION 76%
75.0000 mL | Freq: Once | INTRAVENOUS | Status: AC | PRN
  Administered 2018-03-27: 75 mL via INTRAVENOUS

## 2018-03-27 MED ORDER — HEPARIN (PORCINE) 25000 UT/250ML-% IV SOLN
1250.0000 [IU]/h | INTRAVENOUS | Status: DC
  Administered 2018-03-27 – 2018-03-29 (×3): 1250 [IU]/h via INTRAVENOUS
  Filled 2018-03-27 (×3): qty 250

## 2018-03-27 MED ORDER — QUETIAPINE FUMARATE 200 MG PO TABS
200.0000 mg | ORAL_TABLET | Freq: Every day | ORAL | Status: DC
  Administered 2018-03-27 – 2018-04-04 (×9): 200 mg via ORAL
  Filled 2018-03-27: qty 8
  Filled 2018-03-27: qty 1
  Filled 2018-03-27 (×3): qty 8
  Filled 2018-03-27: qty 1
  Filled 2018-03-27: qty 8
  Filled 2018-03-27 (×4): qty 1

## 2018-03-27 MED ORDER — SODIUM CHLORIDE 0.9% FLUSH: 3.0000 mL | INTRAVENOUS | Status: DC | PRN

## 2018-03-27 MED ORDER — DILTIAZEM HCL-DEXTROSE 100-5 MG/100ML-% IV SOLN (PREMIX)
5.0000 mg/h | INTRAVENOUS | Status: DC
  Administered 2018-03-27: 10 mg/h via INTRAVENOUS
  Administered 2018-03-27: 5 mg/h via INTRAVENOUS
  Administered 2018-03-27: 7.5 mg/h via INTRAVENOUS
  Filled 2018-03-27: qty 100
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
## 2018-03-27 NOTE — Progress Notes (Signed)
Advanced Care Plan.  Purpose of Encounter: CODE STATUS. Parties in Attendance: The patient, her husband, RN and me. Patient's Decisional Capacity: Yes. /Medical Story: Ariana Riggs  is a 59 y.o. female with a known history of COPD, OSA on CPAP at night, GERD, hyperlipidemia, substance abuse and etc. she is being admitted for acute onset CHF and A. fib with RVR.  I discussed with patient about her current condition, prognosis and CODE STATUS.  She stated that she wants to be resuscitated and intubated if she has cardiopulmonary arrest. Plan:  Code Status: Full code. Time spent discussing advance care planning: 17 minutes.
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
## 2018-03-28 ENCOUNTER — Encounter: Payer: Self-pay | Admitting: Radiology

## 2018-03-28 ENCOUNTER — Inpatient Hospital Stay: Payer: 59

## 2018-03-28 ENCOUNTER — Other Ambulatory Visit: Payer: Self-pay

## 2018-03-28 ENCOUNTER — Inpatient Hospital Stay (HOSPITAL_COMMUNITY)
Admission: AD | Admit: 2018-03-28 | Discharge: 2018-03-28 | Disposition: A | Discharge status: Home / Self Care | Payer: 59 | Source: Ambulatory Visit | Attending: Internal Medicine | Admitting: Internal Medicine

## 2018-03-28 DIAGNOSIS — I509 Heart failure, unspecified: Secondary | ICD-10-CM

## 2018-03-28 DIAGNOSIS — I340 Nonrheumatic mitral (valve) insufficiency: Secondary | ICD-10-CM

## 2018-03-28 DIAGNOSIS — I361 Nonrheumatic tricuspid (valve) insufficiency: Secondary | ICD-10-CM

## 2018-03-28 DIAGNOSIS — I4891 Unspecified atrial fibrillation: Secondary | ICD-10-CM

## 2018-03-28 LAB — HEPARIN LEVEL (UNFRACTIONATED)
HEPARIN UNFRACTIONATED: 0.38 [IU]/mL (ref 0.30–0.70)
Heparin Unfractionated: 0.48 IU/mL (ref 0.30–0.70)

## 2018-03-28 LAB — GLUCOSE, CAPILLARY
Glucose-Capillary: 152 mg/dL — ABNORMAL HIGH (ref 70–99)
Glucose-Capillary: 131 mg/dL — ABNORMAL HIGH (ref 70–99)

## 2018-03-28 LAB — BASIC METABOLIC PANEL
Anion gap: 11 (ref 5–15)
BUN: 20 mg/dL (ref 6–20)
CO2: 20 mmol/L — ABNORMAL LOW (ref 22–32)
Calcium: 8.6 mg/dL — ABNORMAL LOW (ref 8.9–10.3)
Chloride: 104 mmol/L (ref 98–111)
Creatinine, Ser: 1.33 mg/dL — ABNORMAL HIGH (ref 0.44–1.00)
GFR calc Af Amer: 51 mL/min — ABNORMAL LOW (ref >60)
GFR calc non Af Amer: 44 mL/min — ABNORMAL LOW (ref >60)
Glucose, Bld: 140 mg/dL — ABNORMAL HIGH (ref 70–99)
Potassium: 4.7 mmol/L (ref 3.5–5.1)
Sodium: 135 mmol/L (ref 135–145)

## 2018-03-28 LAB — TROPONIN I
Troponin I: 0.05 ng/mL (ref <0.03)
Troponin I: 0.03 ng/mL (ref <0.03)
Troponin I: 0.03 ng/mL (ref <0.03)

## 2018-03-28 LAB — HEMOGLOBIN A1C
HEMOGLOBIN A1C: 6.4 % — AB (ref 4.8–5.6)
Mean Plasma Glucose: 136.98 mg/dL

## 2018-03-28 LAB — CBC
HCT: 49.5 % — ABNORMAL HIGH (ref 36.0–46.0)
Hemoglobin: 16 g/dL — ABNORMAL HIGH (ref 12.0–15.0)
MCH: 32.7 pg (ref 26.0–34.0)
MCHC: 32.3 g/dL (ref 30.0–36.0)
MCV: 101 fL — ABNORMAL HIGH (ref 80.0–100.0)
NRBC: 0.7 % — AB (ref 0.0–0.2)
Platelets: 290 10*3/uL (ref 150–400)
RBC: 4.9 MIL/uL (ref 3.87–5.11)
RDW: 15.6 % — ABNORMAL HIGH (ref 11.5–15.5)
WBC: 12.3 10*3/uL — ABNORMAL HIGH (ref 4.0–10.5)

## 2018-03-28 LAB — LACTIC ACID, PLASMA: Lactic Acid, Venous: 4.8 mmol/L (ref 0.5–1.9)

## 2018-03-28 LAB — MAGNESIUM: Magnesium: 2.6 mg/dL — ABNORMAL HIGH (ref 1.7–2.4)

## 2018-03-28 LAB — TSH: TSH: 15.504 u[IU]/mL — ABNORMAL HIGH (ref 0.350–4.500)

## 2018-03-28 LAB — MRSA PCR SCREENING: MRSA by PCR: NEGATIVE

## 2018-03-28 LAB — HIV ANTIBODY (ROUTINE TESTING W REFLEX): HIV Screen 4th Generation wRfx: NONREACTIVE

## 2018-03-28 LAB — PROCALCITONIN: PROCALCITONIN: 0.13 ng/mL

## 2018-03-28 MED ORDER — MORPHINE SULFATE (PF) 2 MG/ML IV SOLN
2.0000 mg | INTRAVENOUS | Status: AC
  Administered 2018-03-28: 2 mg via INTRAVENOUS

## 2018-03-28 MED ORDER — SODIUM CHLORIDE 0.9 % IV SOLN: 500.0000 mg | INTRAVENOUS | Status: DC

## 2018-03-28 MED ORDER — ALPRAZOLAM 0.5 MG PO TABS
0.5000 mg | ORAL_TABLET | Freq: Once | ORAL | Status: DC
  Filled 2018-03-28 (×2): qty 1

## 2018-03-28 MED ORDER — SODIUM CHLORIDE 0.9 % IV SOLN
100.0000 mg | Freq: Two times a day (BID) | INTRAVENOUS | Status: DC
  Administered 2018-03-28: 100 mg via INTRAVENOUS
  Filled 2018-03-28 (×2): qty 100

## 2018-03-28 MED ORDER — PHENYLEPHRINE HCL-NACL 10-0.9 MG/250ML-% IV SOLN
0.0000 ug/min | INTRAVENOUS | Status: DC
  Filled 2018-03-28: qty 250

## 2018-03-28 MED ORDER — IOPAMIDOL (ISOVUE-300) INJECTION 61%
15.0000 mL | INTRAVENOUS | Status: AC
  Administered 2018-03-28 (×2): 15 mL via ORAL

## 2018-03-28 MED ORDER — SODIUM CHLORIDE 0.9 % IV BOLUS
250.0000 mL | Freq: Once | INTRAVENOUS | Status: AC
  Administered 2018-03-28: 250 mL via INTRAVENOUS

## 2018-03-28 MED ORDER — METOPROLOL TARTRATE 5 MG/5ML IV SOLN
2.5000 mg | Freq: Four times a day (QID) | INTRAVENOUS | Status: DC | PRN
  Administered 2018-04-01: 2.5 mg via INTRAVENOUS
  Filled 2018-03-28: qty 5

## 2018-03-28 MED ORDER — IOHEXOL 300 MG/ML  SOLN
75.0000 mL | Freq: Once | INTRAMUSCULAR | Status: AC | PRN
  Administered 2018-03-29: 75 mL via INTRAVENOUS

## 2018-03-28 MED ORDER — PHENAZOPYRIDINE HCL 100 MG PO TABS
100.0000 mg | ORAL_TABLET | Freq: Three times a day (TID) | ORAL | Status: DC
  Administered 2018-03-28 – 2018-03-31 (×9): 100 mg via ORAL
  Filled 2018-03-28 (×11): qty 1

## 2018-03-28 MED ORDER — FUROSEMIDE 10 MG/ML IJ SOLN
20.0000 mg | Freq: Once | INTRAMUSCULAR | Status: AC
  Administered 2018-03-28: 20 mg via INTRAVENOUS
  Filled 2018-03-28: qty 2

## 2018-03-28 MED ORDER — NITROGLYCERIN 0.4 MG SL SUBL
SUBLINGUAL_TABLET | SUBLINGUAL | Status: AC
  Filled 2018-03-28: qty 1

## 2018-03-28 MED ORDER — FUROSEMIDE 10 MG/ML IJ SOLN: 20.0000 mg | Freq: Once | INTRAMUSCULAR | Status: DC

## 2018-03-28 MED ORDER — METHYLPREDNISOLONE SODIUM SUCC 125 MG IJ SOLR
125.0000 mg | Freq: Once | INTRAMUSCULAR | Status: AC
  Administered 2018-03-28: 125 mg via INTRAVENOUS
  Filled 2018-03-28: qty 2

## 2018-03-28 MED ORDER — MORPHINE SULFATE (PF) 2 MG/ML IV SOLN
INTRAVENOUS | Status: AC
  Administered 2018-03-28: 2 mg via INTRAVENOUS
  Filled 2018-03-28: qty 1

## 2018-03-28 MED ORDER — IPRATROPIUM-ALBUTEROL 0.5-2.5 (3) MG/3ML IN SOLN
3.0000 mL | Freq: Four times a day (QID) | RESPIRATORY_TRACT | Status: DC
  Administered 2018-03-28 – 2018-03-30 (×11): 3 mL via RESPIRATORY_TRACT
  Filled 2018-03-28 (×12): qty 3
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
## 2018-03-28 NOTE — Progress Notes (Signed)
Pt went to CT for scan but was unable to complete 2/2 inability to lay flat. CT tech stated the pt was "turning blue" when she attempted, and could not tolerate. Pt willing to attempt again in AM; on call provider notified, no new orders at this time.
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
## 2018-03-28 NOTE — Progress Notes (Signed)
   03/28/18 0100  Clinical Encounter Type  Visited With Patient not available;Health care provider  Visit Type Code  Referral From Nurse   RRT page to the patient's room. Upon arrival, medical team making assessment of the patient's status. Chaplain maintained pastoral presence outside the patient's room and offered silent prayer. No family present at this time, but has patient requested her husband.

## 2018-03-28 NOTE — Consult Note (Signed)
Name: Ariana Riggs MRN: [id number] DOB: August 02, 1960    ADMISSION DATE:  03/27/2018 CONSULTATION DATE: 03/28/2018  REFERRING MD : Dr. Jannifer Franklin   CHIEF COMPLAINT: Shortness of Breath    BRIEF PATIENT DESCRIPTION:  58 yo female admitted to the telemetry unit with acute on chronic respiratory failure secondary to pneumonia, right pleural effusion, and mild CHF requiring transfer to the stepdown unit with angina and hypotension likely secondary to cardizem gtt   SIGNIFICANT EVENTS/STUDIES:  02/10-Pt admitted to the telemetry unit 02/10-CTA Chest revealed no pulmonary embolism identified, with mild study limitations detailed above. Dense consolidation within the RIGHT lower lobe, pneumonia versus Atelectasis. RIGHT pleural effusion, moderate to large in size, new compared to earlier CT dated 10/14/2016. Small LEFT pleural effusion with associated atelectasis. Cardiomegaly with bilateral pulmonary edema indicating CHF. Mediastinal and RIGHT hilar lymphadenopathy, at least mildly progressed compared to the earlier chest CT of 05/02/2016. This is most likely reactive in nature, however, neoplastic lymphadenopathy cannot be excluded. Recommend follow-up chest CT in 3 months to ensure stability or resolution. Aortic Atherosclerosis (ICD10-I70.0) and Emphysema (ICD10-J43.9). 02/11-Rapid response initiated pt with c/o angina required transfer to the stepdown unit due to hypotension   HISTORY OF PRESENT ILLNESS:   This is a 58 yo female with a PMH as listed below who presented to [facility] on 02/10 as a direct admit to the telemetry unit from Dr. Gust Brooms office (her outpatient pulmonologist) due to atrial fibrillation with rvr hr 150's.  She initially presented to Dr. Gust Brooms office for evaluation of worsening shortness of breath onset of symptoms 3 weeks prior to presentation.  She also endorsed orthopnea and bilateral leg swelling onset 02/2. CTA chest negative for pulmonary embolism, however results  concerning for bilateral pulmonary edema, moderate to large right pleural effusion, and possible pneumonia.  Lab results revealed d-dimer 3,126.18, BNP 212, hgb 5.4, and wbc 10.0.  She received 10 mg iv lasix x1 dose, however due to continued respiratory failure she received an additional 20 mg of iv lasix.  Pt also started on cardizem gtt upon admission to the telemetry unit for heart rate control.  On 02/11 pt developed angina, shortness of breath, and severe anxiety therefore rapid response initiated. She was found to be hypotensive with sbp 70's.  Therefore, pt transferred to the stepdown unit for closer monitoring.    PAST MEDICAL HISTORY :   has a past medical history of Anxiety, Arthritis, Asthma, Bipolar disorder ([place]), Closed head injury (1975), Coma ([place]) (1975), COPD (chronic obstructive pulmonary disease) ([place]), Depression, GERD (gastroesophageal reflux disease), H/O: substance abuse ([place]), Hyperlipidemia, Shortness of breath dyspnea, Sleep apnea, and Wears dentures.  has a past surgical history that includes ASD repair (1980); Cholecystectomy; Tubal ligation; Tuboplasty / tubotubal anastomosis; Cosmetic surgery (1975); and Esophagogastroduodenoscopy (N/A, 07/09/2015). Prior to Admission medications   Medication Sig Start Date End Date Taking? Authorizing Provider  acetaminophen (TYLENOL) 500 MG tablet Take 1,000 mg by mouth every 6 (six) hours as needed.    [provider]  albuterol (PROVENTIL HFA;VENTOLIN HFA) 108 (90 Base) MCG/ACT inhaler Inhale into the lungs every 6 (six) hours as needed for wheezing or shortness of breath.    [provider]  aspirin 81 MG tablet Take 81 mg by mouth daily.    [provider]  buPROPion (ZYBAN) 150 MG 12 hr tablet Take 150 mg by mouth daily.    [provider]  citalopram (CELEXA) 40 MG tablet Take 40 mg by mouth daily.  [provider]  fluticasone (FLOVENT DISKUS) 50 MCG/BLIST diskus inhaler Inhale 1 puff  into the lungs 2 (two) times daily.    [provider]  HYDROcodone-acetaminophen (NORCO/VICODIN) 5-325 MG tablet 1-2 tabs po bid prn 02/27/18   Norval Gable, MD  meloxicam (MOBIC) 7.5 MG tablet Take 7.5 mg by mouth daily.    [provider]  multivitamin-iron-minerals-folic acid (CENTRUM) chewable tablet Chew 1 tablet by mouth daily.    [provider]  omeprazole (PRILOSEC) 20 MG capsule Take 20 mg by mouth 2 (two) times daily before a meal.    [provider]  prazosin (MINIPRESS) 1 MG capsule Take 1 mg by mouth daily.    [provider]  predniSONE (DELTASONE) 20 MG tablet 3 tabs po qd x 2 days, then 2 tabs po qd x 2 days, then 1 tab po qd x 2 days, then half a tab po qd x 2 days 02/27/18   Norval Gable, MD  QUEtiapine (SEROQUEL) 200 MG tablet Take 200 mg by mouth at bedtime.    [provider]  sucralfate (CARAFATE) 1 g tablet Take 1 g by mouth 4 (four) times daily.    [provider]  traMADol (ULTRAM) 50 MG tablet Take 1 tablet (50 mg total) by mouth every 6 (six) hours as needed. 01/03/18   Harvest Dark, MD   Allergies  Allergen Reactions  . Paxil [Paroxetine Hcl] Hives  . Penicillins Hives  . Sulfa Antibiotics Hives    FAMILY HISTORY:  family history includes Dementia in her father; Diabetes in her mother; Hypertension in her father and mother. SOCIAL HISTORY:  reports that she has been smoking cigarettes. She has a 10.00 pack-year smoking history. She has never used smokeless tobacco. She reports current alcohol use of about 4.0 standard drinks of alcohol per week.  REVIEW OF SYSTEMS: Positives in BOLD  Constitutional: Negative for fever, chills, weight loss, malaise/fatigue and diaphoresis.  HENT: Negative for hearing loss, ear pain, nosebleeds, congestion, sore throat, neck pain, tinnitus and ear discharge.   Eyes: Negative for blurred vision, double vision, photophobia, pain, discharge and redness.    Respiratory: cough, hemoptysis, sputum production, shortness of breath, orthopnea,  wheezing and stridor.   Cardiovascular: chest pain, palpitations, orthopnea, claudication, leg swelling and PND.  Gastrointestinal: Negative for heartburn, nausea, vomiting, abdominal pain, diarrhea, constipation, blood in stool and melena.  Genitourinary: Negative for dysuria, urgency, frequency, hematuria and flank pain.  Musculoskeletal: Negative for myalgias, back pain, joint pain and falls.  Skin: Negative for itching and rash.  Neurological: Negative for dizziness, tingling, tremors, sensory change, speech change, focal weakness, seizures, loss of consciousness, weakness and headaches.  Endo/Heme/Allergies: Negative for environmental allergies and polydipsia. Does not bruise/bleed easily.  SUBJECTIVE:  Pt c/o shortness of breath   VITAL SIGNS: Temp:  [97.2 F (36.2 C)] 97.2 F (36.2 C) (02/10 1704) Pulse Rate:  [58-149] 58 (02/11 0129) Resp:  [20-22] 22 (02/10 1902) BP: (73-155)/(37-140) 73/37 (02/11 0129) SpO2:  [91 %-98 %] 96 % (02/11 0100) Weight:  [109.7 kg] 109.7 kg (02/10 1714)  PHYSICAL EXAMINATION: General: acutely ill appearing female, in acute respiratory distress  Neuro: alert and oriented follows commands  HEENT: mild JVD Cardiovascular: irregular irregular, no R/G Lungs: diffuse crackles and wheezes throughout, labored and tachypneic  Abdomen: +BS x4, obese, taut Musculoskeletal: trace bilateral lower extremity edema  Skin: intact no rashes or lesions present   Recent Labs  Lab 03/27/18 1428 03/27/18 1724 03/28/18 0059  NA  --  135 135  K  --  5.4* 4.7  CL  --  105 104  CO2  --  22 20*  BUN  --  16 20  CREATININE 1.00 0.83 1.33*  GLUCOSE  --  107* 140*   Recent Labs  Lab 03/27/18 1724 03/28/18 0059  HGB 15.0 16.0*  HCT 46.2* 49.5*  WBC 10.0 12.3*  PLT 236 290   Ct Angio Chest Pe W Or Wo Contrast  Result Date: 03/27/2018 CLINICAL DATA:  Worsening shortness of  breath for 2 months. History of COPD. Former smoker. EXAM: CT ANGIOGRAPHY CHEST WITH CONTRAST TECHNIQUE: Multidetector CT imaging of the chest was performed using the standard protocol during bolus administration of intravenous contrast. Multiplanar CT image reconstructions and MIPs were obtained to evaluate the vascular anatomy. CONTRAST:  23mL ISOVUE-370 IOPAMIDOL (ISOVUE-370) INJECTION 76% COMPARISON:  Chest CT dated 10/14/2016. FINDINGS: Cardiovascular: Cardiomegaly. No pericardial effusion. Distended IVC suggesting RIGHT heart failure. Aortic atherosclerosis. No thoracic aortic aneurysm. Some of the peripheral segmental and subsegmental pulmonary arteries are difficult to definitively characterize due to patient motion artifact, however, there is no convincing pulmonary embolism identified within the main, lobar or central segmental pulmonary arteries. Mediastinum/Nodes: Scattered small and moderately enlarged lymph nodes within the mediastinum and RIGHT hilum, at least mildly progressed compared to the earlier chest CT. Esophagus is difficult to characterize. Trachea is unremarkable. Lungs/Pleura: Dense consolidation within the RIGHT lower lobe, atelectasis versus pneumonia. RIGHT pleural effusion, moderate to large in size. Small LEFT pleural effusion with associated atelectasis. Bilateral interstitial edema. Emphysematous changes within the lung apices, mild to moderate in degree. Upper Abdomen: Limited images of the upper abdomen are unremarkable. Musculoskeletal: Mild degenerative spurring within the lower thoracic spine. No acute or suspicious osseous finding. Median sternotomy wires in place. Review of the MIP images confirms the above findings. IMPRESSION: 1. No pulmonary embolism identified, with mild study limitations detailed above. 2. Dense consolidation within the RIGHT lower lobe, pneumonia versus atelectasis. 3. RIGHT pleural effusion, moderate to large in size, new compared to earlier CT dated  10/14/2016. 4. Small LEFT pleural effusion with associated atelectasis. 5. Cardiomegaly with bilateral pulmonary edema indicating CHF. 6. Mediastinal and RIGHT hilar lymphadenopathy, at least mildly progressed compared to the earlier chest CT of 05/02/2016. This is most likely reactive in nature, however, neoplastic lymphadenopathy cannot be excluded. Recommend follow-up chest CT in 3 months to ensure stability or resolution. Aortic Atherosclerosis (ICD10-I70.0) and Emphysema (ICD10-J43.9). Electronically Signed   By: Franki Cabot M.D.   On: 03/27/2018 15:00    ASSESSMENT / PLAN:  Acute on chronic respiratory failure secondary to questionable pneumonia, bilateral pleural effusion, and mild CHF Hx: COPD, Asthma, Interstitial Lung Disease, Current Everyday Smoker, and OSA-wears CPAP  Supplemental O2 for dyspnea and/or hypoxia CPAP qhs  Continue nebulized steroids  Prn bronchodilator therapy  Trend WBC and monitor fever curve Check PCT and lactic acid  If PCT elevated will start cefepime  Smoking cessation counseling provided  Atrial fibrillation with RVR Hypotension likely secondary to antiarrhythmic medication  Mildly elevated troponin secondary to demand ischemia secondary to atrial fibrillation and respiratory failure  New onset CHF  Continuous telemetry monitoring  Cardiology consulted appreciate input  Trend troponin's Echo pending  Prn neo-synephrine gtt to maintain map >65 Will r/o sepsis  Prn low dose metoprolol for hr control  Continue heparin gtt Trend CBC and monitor for s/sx of bleeding   Acute renal failure secondary to diuretic  Hyperkalemia-resolved  Trend BMP  Replace electrolytes  as indicated  Monitor UOP Avoid nephrotoxic medications for now   GERD Continue protonix   Anxiety  Prn xanax   Marda Stalker, [place] Pager [phone] (please enter 7 digits) PCCM Consult Pager [phone] (please enter 7 digits)

## 2018-03-28 NOTE — Progress Notes (Signed)
[name] NAME: Ariana Riggs    MR#:  [id number]  DATE OF BIRTH:  [birth year]  SUBJECTIVE:   Patient presented to the hospital secondary to shortness of breath, lower extremity edema and noted to be in CHF and also noted to be atrial fibrillation with rapid ventricular response.  Patient has been weaned off the Cardizem drip now and currently in a sinus rhythm.  Off BiPAP.  Family is at bedside.  Overall feels a bit better.  REVIEW OF SYSTEMS:    Review of Systems  Constitutional: Negative for chills and fever.  HENT: Negative for congestion and tinnitus.   Eyes: Negative for blurred vision and double vision.  Respiratory: Negative for cough, shortness of breath and wheezing.   Cardiovascular: Negative for chest pain, orthopnea and PND.  Gastrointestinal: Negative for abdominal pain, diarrhea, nausea and vomiting.  Genitourinary: Negative for dysuria and hematuria.  Neurological: Negative for dizziness, sensory change and focal weakness.  All other systems reviewed and are negative.   Nutrition: Heart Healthy Tolerating Diet: Yes Tolerating PT: Await Eval.   DRUG ALLERGIES:   Allergies  Allergen Reactions  . Paxil [Paroxetine Hcl] Hives  . Penicillins Hives  . Sulfa Antibiotics Hives    VITALS:  Blood pressure 104/88, pulse 86, temperature 97.7 F (36.5 C), temperature source Oral, resp. rate 19, height 5\' 6"  (1.676 m), weight 109 kg, SpO2 96 %.  PHYSICAL EXAMINATION:   Physical Exam  GENERAL:  58 y.o.-year-old patient lying in bed in no acute distress.  EYES: Pupils equal, round, reactive to light and accommodation. No scleral icterus. Extraocular muscles intact.  HEENT: Head atraumatic, normocephalic. Oropharynx and nasopharynx clear.  NECK:  Supple, no jugular venous distention. No thyroid enlargement, no tenderness.  LUNGS: Normal breath sounds bilaterally, no wheezing, basilar rales, No rhonchi. No use of  accessory muscles of respiration.  CARDIOVASCULAR: S1, S2 normal. No murmurs, rubs, or gallops.  ABDOMEN: Soft, nontender, nondistended. Bowel sounds present. No organomegaly or mass.  EXTREMITIES: No cyanosis, clubbing, + 1 edema b/l   NEUROLOGIC: Cranial nerves II through XII are intact. No focal Motor or sensory deficits b/l.   PSYCHIATRIC: The patient is alert and oriented x 3.  SKIN: No obvious rash, lesion, or ulcer.    LABORATORY PANEL:   CBC Recent Labs  Lab 03/28/18 0059  WBC 12.3*  HGB 16.0*  HCT 49.5*  PLT 290   ------------------------------------------------------------------------------------------------------------------  Chemistries  Recent Labs  Lab 03/27/18 1724 03/28/18 0059  NA 135 135  K 5.4* 4.7  CL 105 104  CO2 22 20*  GLUCOSE 107* 140*  BUN 16 20  CREATININE 0.83 1.33*  CALCIUM 8.6* 8.6*  MG 2.4 2.6*  AST 42*  --   ALT 42  --   ALKPHOS 118  --   BILITOT 2.2*  --    ------------------------------------------------------------------------------------------------------------------  Cardiac Enzymes Recent Labs  Lab 03/28/18 0656  TROPONINI 0.03*   ------------------------------------------------------------------------------------------------------------------  RADIOLOGY:  Ct Angio Chest Pe W Or Wo Contrast  Result Date: 03/27/2018 CLINICAL DATA:  Worsening shortness of breath for 2 months. History of COPD. Former smoker. EXAM: CT ANGIOGRAPHY CHEST WITH CONTRAST TECHNIQUE: Multidetector CT imaging of the chest was performed using the standard protocol during bolus administration of intravenous contrast. Multiplanar CT image reconstructions and MIPs were obtained to evaluate the vascular anatomy. CONTRAST:  98mL ISOVUE-370 IOPAMIDOL (ISOVUE-370) INJECTION 76% COMPARISON:  Chest CT dated 10/14/2016. FINDINGS: Cardiovascular: Cardiomegaly. No pericardial  effusion. Distended IVC suggesting RIGHT heart failure. Aortic atherosclerosis. No thoracic  aortic aneurysm. Some of the peripheral segmental and subsegmental pulmonary arteries are difficult to definitively characterize due to patient motion artifact, however, there is no convincing pulmonary embolism identified within the main, lobar or central segmental pulmonary arteries. Mediastinum/Nodes: Scattered small and moderately enlarged lymph nodes within the mediastinum and RIGHT hilum, at least mildly progressed compared to the earlier chest CT. Esophagus is difficult to characterize. Trachea is unremarkable. Lungs/Pleura: Dense consolidation within the RIGHT lower lobe, atelectasis versus pneumonia. RIGHT pleural effusion, moderate to large in size. Small LEFT pleural effusion with associated atelectasis. Bilateral interstitial edema. Emphysematous changes within the lung apices, mild to moderate in degree. Upper Abdomen: Limited images of the upper abdomen are unremarkable. Musculoskeletal: Mild degenerative spurring within the lower thoracic spine. No acute or suspicious osseous finding. Median sternotomy wires in place. Review of the MIP images confirms the above findings. IMPRESSION: 1. No pulmonary embolism identified, with mild study limitations detailed above. 2. Dense consolidation within the RIGHT lower lobe, pneumonia versus atelectasis. 3. RIGHT pleural effusion, moderate to large in size, new compared to earlier CT dated 10/14/2016. 4. Small LEFT pleural effusion with associated atelectasis. 5. Cardiomegaly with bilateral pulmonary edema indicating CHF. 6. Mediastinal and RIGHT hilar lymphadenopathy, at least mildly progressed compared to the earlier chest CT of 05/02/2016. This is most likely reactive in nature, however, neoplastic lymphadenopathy cannot be excluded. Recommend follow-up chest CT in 3 months to ensure stability or resolution. Aortic Atherosclerosis (ICD10-I70.0) and Emphysema (ICD10-J43.9). Electronically Signed   By: Franki Cabot M.D.   On: 03/27/2018 15:00   US Pelvis  Limited (transabdominal Only)  Result Date: 03/28/2018 CLINICAL DATA:  Bladder is distension, status post Foley catheter placement. EXAM: LIMITED ULTRASOUND OF PELVIS TECHNIQUE: Limited transabdominal ultrasound examination of the pelvis was performed. COMPARISON:  None. FINDINGS: The bladder is not visualized and presumably is decompressed. Ascites is noted in the pelvis. Foley catheter is not clearly visualized. IMPRESSION: Bladder is not visualized and is presumably decompressed. Foley catheter is not clearly visualized either. Ascites is noted in the pelvis. Electronically Signed   By: Marijo Conception, M.D.   On: 03/28/2018 12:56   US Abdomen Limited  Result Date: 03/28/2018 CLINICAL DATA:  Ascites. EXAM: LIMITED ABDOMEN ULTRASOUND FOR ASCITES TECHNIQUE: Limited ultrasound survey for ascites was performed in all four abdominal quadrants. COMPARISON:  CT scan of January 03, 2018. FINDINGS: Minimal amount of ascites is seen in the right upper quadrant. Mild amount of ascites is noted in the lower quadrants. No definite ascites is noted in left upper quadrant. IMPRESSION: Minimal to mild ascites is noted as described above. Electronically Signed   By: Marijo Conception, M.D.   On: 03/28/2018 12:38   Dg Chest Port 1 View  Result Date: 03/28/2018 CLINICAL DATA:  Cough EXAM: PORTABLE CHEST 1 VIEW COMPARISON:  01/03/2018 FINDINGS: Cardiomegaly with vascular congestion. Bilateral layering effusions and bibasilar atelectasis. Diffuse interstitial prominence, likely interstitial edema. IMPRESSION: Cardiomegaly with vascular congestion and interstitial edema. Layering effusions with bibasilar atelectasis. Electronically Signed   By: Rolm Baptise M.D.   On: 03/28/2018 01:57     ASSESSMENT AND PLAN:   58 year old female with past medical history of bipolar disorder, COPD, CHF, obstructive sleep apnea, hypertension, hyperlipidemia who presented to the hospital due to shortness of breath and lower extreme edema  noted to be in CHF.  1.  CHF- cause of pt's shortness of breath and worsening lower  extremity edema. -This was secondary to the patient's A. fib with RVR. -Improved with IV diuresis. -Follow I's and O's and daily weights.  Weight echocardiogram results, await further cardiology input.  2.  Atrial fibrillation with rapid ventricular response- patient was initially on a Cardizem drip but now has converted to normal sinus rhythm. - Continue pulse doses of IV metoprolol for now.  Await further cardiology input. -Await echocardiogram results.  3.  COPD-no acute exacerbation.  Continue scheduled duo nebs  4.  Tobacco abuse-continue nicotine patch.  5.  Anxiety/depression-continue Celexa, Wellbutrin.  All the records are reviewed and case discussed with Care Management/Social Worker. Management plans discussed with the patient, family and they are in agreement.  CODE STATUS: Full code  DVT Prophylaxis: Heparin gtt  TOTAL TIME TAKING CARE OF THIS PATIENT: 30 minutes.   POSSIBLE D/C IN 1-2 DAYS, DEPENDING ON CLINICAL CONDITION.   Henreitta Leber M.D on 03/28/2018 at 1:27 PM  Between 7am to 6pm - Pager - [phone]  After 6pm go to [email or web address] - [employer]wer [facility]  Office  [phone]  CC: Primary care physician; Ezequiel Kayser, MD

## 2018-03-28 NOTE — Plan of Care (Signed)
Nutrition Education Note  RD consulted for nutrition education regarding new onset CHF.  Met with patient, husband, son, and another family member at bedside. Patient reports she is newly diagnosed with CHF. She has never had any issues with retaining fluid before. She did recently get diagnosed with gout about one month ago so she had started replacing red meat in her diet with chicken. She typically has a good appetite and eats 3 meals per day prepared by her husband. Breakfast intake varies. Lunch and dinner are usually chicken or another meat with salad or green vegetables.   RD provided "Heart Failure Nutrition Therapy" handout from the [employer] of [employer]. Reviewed patient's dietary recall. Provided examples on ways to decrease sodium intake in diet. Discouraged intake of processed foods and use of salt shaker. Encouraged fresh fruits and vegetables as well as whole grain sources of carbohydrates to maximize fiber intake.   RD discussed why it is important for patient to adhere to diet recommendations, and emphasized the role of fluids, foods to avoid, and importance of weighing self daily. Teach back method used.  Expect good compliance.  Body mass index is 38.79 kg/m. Pt meets criteria for Obesity Class II based on current BMI.  Current diet order is Heart Healthy, patient is consuming approximately 100% of meals at this time. Labs and medications reviewed. No further nutrition interventions warranted at this time. RD contact information provided. If additional nutrition issues arise, please re-consult RD.   Willey Blade, MS, [place], LDN Office: [phone] Pager: [phone] After Hours/Weekend Pager: [phone]

## 2018-03-28 NOTE — Progress Notes (Signed)
Husband reached at [phone].  Informed of change in condition and pending transfer to ICU.  States he will come to hospital ASAP.

## 2018-03-28 NOTE — Progress Notes (Signed)
Report given to RN Claiborne Billings for patient to be transfered to room 232. Husband is at bedside during transfer.

## 2018-03-28 NOTE — Progress Notes (Signed)
Attempts to reach husband via phone number listed in chart unsuccessful at this time. (Phone calls went to voicemail).

## 2018-03-28 NOTE — Progress Notes (Signed)
Patient continues to c/o SOB diaphoretic and restless. Blood sugar was 140, and BP was WDL with HR is the 110-120 , and O2sat in the low 90s on 2L of supplemental oxygen  Dr. Jannifer Franklin was informed of patient's status. Per order 20mg  of lasix was administered, and patient supplemental O2 was increased from 2L to 4L. Patient continues to c/o of feeling more SOB and restless.Dr. Jannifer Franklin was updated of patient/s status and rapid response was initiated. Initial SBP on arrival of rapid response was 130-140. Manual blood pressure was 88/46, 267mL of NS was administered per order. Patient BP finally came up to 112/79 with HR in the 80s. Patient was transferred to ICU per order

## 2018-03-28 NOTE — Care Management Note (Signed)
Case Management Note  Patient Details  Name: Ariana Riggs MRN: [id number] Date of Birth: 1960-03-05  Subjective/Objective:   Patient is from home with husband.  Admitted with new diagnoses of CHF.  She is currently very uncomfortable in the bed.  Stating she needs to pee but unable to.  Catheter in place with minimal dark urine.  Patient's nurse has already put a page into the MD.  Dietary consult has been done.  Husband states he is the cook and will be able to assist her in her diet.  They do not have a functioning scale but state they are able to obtain one easily.  Has a HF clinic appointment.  Provided Living with Heart Failure booklet.  Current with PCP.  Obtains medications at [facility] in [facility] without difficulty.  Independent in all ADL's.  Will continue to assist with progression of patient and discharge planning.                  Action/Plan:   Expected Discharge Date:                  Expected Discharge Plan:  Home/Self Care  In-House Referral:     Discharge planning Services  CM Consult, HF Clinic  Post Acute Care Choice:    Choice offered to:     DME Arranged:    DME Agency:     HH Arranged:    HH Agency:     Status of Service:  In process, will continue to follow  If discussed at Long Length of Stay Meetings, dates discussed:    Additional Comments:  Elza Rafter, RN 03/28/2018, 2:31 PM

## 2018-03-28 NOTE — Progress Notes (Signed)
Patient ID: Ariana Riggs, female   DOB: April 01, 1960, 58 y.o.   MRN: [id number]  Discussed with patient's nurse Claiborne Billings. Patient is drinking PO contrast for CT abdomen. I have asked her to relay it to the oncoming nurse to let the hospital is on call informed about the CT abdomen results once there available by calling [phone].

## 2018-03-28 NOTE — Progress Notes (Signed)
eLink Physician-Brief Progress Note Patient Name: Ariana Riggs DOB: Jul 02, 1960 MRN: [id number]   Date of Service  03/28/2018  HPI/Events of Note  58 yo female with PMH of COPD and morbid obesity. Presents with new onset of AFIB with RVR and CHF. Now on Diltiazem IV infusion. PCCM asked to assume care in ICU.  VSS.  eICU Interventions  No new orders.      Intervention Category Evaluation Type: New Patient Evaluation  Lysle Dingwall 03/28/2018, 2:09 AM

## 2018-03-28 NOTE — Progress Notes (Signed)
Pt placed on CPAP with 2lpm o2 bled in. Pt became anxious and wanted to take CPAP off.  Pt placed back on 2lpm nasal cannula. Pt c/o SOB. Increased pts O2 to 3lpm nasal cannula. Nurse notified of pt being anxious.

## 2018-03-28 NOTE — Progress Notes (Signed)
Abg results call to tess rn
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
## 2018-03-28 NOTE — Progress Notes (Signed)
This nurse attempted to flush pts urinary catheter multiple times with no changes to flow or decrease in pain. This also readjusted the catheter, by taking out the fluid in the balloon. MD was called and new orders for pyridium were placed. Medication given to pt. Will CTM, total urine output in the 7 Hours I had her on my shift was 145ml.

## 2018-03-29 ENCOUNTER — Inpatient Hospital Stay: Payer: 59

## 2018-03-29 DIAGNOSIS — I420 Dilated cardiomyopathy: Secondary | ICD-10-CM

## 2018-03-29 DIAGNOSIS — R188 Other ascites: Secondary | ICD-10-CM

## 2018-03-29 DIAGNOSIS — R0603 Acute respiratory distress: Secondary | ICD-10-CM

## 2018-03-29 DIAGNOSIS — J810 Acute pulmonary edema: Secondary | ICD-10-CM

## 2018-03-29 LAB — CBC
HCT: 48.1 % — ABNORMAL HIGH (ref 36.0–46.0)
Hemoglobin: 15.7 g/dL — ABNORMAL HIGH (ref 12.0–15.0)
MCH: 32.4 pg (ref 26.0–34.0)
MCHC: 32.6 g/dL (ref 30.0–36.0)
MCV: 99.2 fL (ref 80.0–100.0)
Platelets: 279 10*3/uL (ref 150–400)
RBC: 4.85 MIL/uL (ref 3.87–5.11)
RDW: 15.9 % — ABNORMAL HIGH (ref 11.5–15.5)
WBC: 13.9 10*3/uL — ABNORMAL HIGH (ref 4.0–10.5)
nRBC: 0 % (ref 0.0–0.2)

## 2018-03-29 LAB — GLUCOSE, PLEURAL OR PERITONEAL FLUID: Glucose, Fluid: 126 mg/dL

## 2018-03-29 LAB — BODY FLUID CELL COUNT WITH DIFFERENTIAL
Eos, Fluid: 0 %
Lymphs, Fluid: 59 %
Monocyte-Macrophage-Serous Fluid: 11 %
Neutrophil Count, Fluid: 30 %
Total Nucleated Cell Count, Fluid: 264 uL

## 2018-03-29 LAB — BASIC METABOLIC PANEL WITH GFR
Anion gap: 13 (ref 5–15)
BUN: 41 mg/dL — ABNORMAL HIGH (ref 6–20)
CO2: 21 mmol/L — ABNORMAL LOW (ref 22–32)
Calcium: 8.9 mg/dL (ref 8.9–10.3)
Chloride: 101 mmol/L (ref 98–111)
Creatinine, Ser: 1.45 mg/dL — ABNORMAL HIGH (ref 0.44–1.00)
GFR calc Af Amer: 46 mL/min — ABNORMAL LOW
GFR calc non Af Amer: 40 mL/min — ABNORMAL LOW
Glucose, Bld: 142 mg/dL — ABNORMAL HIGH (ref 70–99)
Potassium: 4.5 mmol/L (ref 3.5–5.1)
Sodium: 135 mmol/L (ref 135–145)

## 2018-03-29 LAB — URINE CULTURE: Culture: NO GROWTH

## 2018-03-29 LAB — PROTEIN, PLEURAL OR PERITONEAL FLUID: Total protein, fluid: 3.2 g/dL

## 2018-03-29 LAB — HEPARIN LEVEL (UNFRACTIONATED): Heparin Unfractionated: 0.43 [IU]/mL (ref 0.30–0.70)

## 2018-03-29 LAB — BLOOD GAS, ARTERIAL
Acid-base deficit: 8.9 mmol/L — ABNORMAL HIGH (ref 0.0–2.0)
Bicarbonate: 16.8 mmol/L — ABNORMAL LOW (ref 20.0–28.0)
Delivery systems: POSITIVE
Expiratory PAP: 5
FIO2: 0.45
INSPIRATORY PAP: 12
Mechanical Rate: 8
O2 Saturation: 95.4 %
Patient temperature: 37
pCO2 arterial: 35 mmHg (ref 32.0–48.0)
pH, Arterial: 7.29 — ABNORMAL LOW (ref 7.350–7.450)
pO2, Arterial: 87 mmHg (ref 83.0–108.0)

## 2018-03-29 LAB — ECHOCARDIOGRAM COMPLETE
Height: 66 in
Weight: 3844.82 oz

## 2018-03-29 LAB — ALBUMIN, PLEURAL OR PERITONEAL FLUID: Albumin, Fluid: 2.1 g/dL

## 2018-03-29 MED ORDER — METOPROLOL TARTRATE 25 MG PO TABS
12.5000 mg | ORAL_TABLET | Freq: Two times a day (BID) | ORAL | Status: AC
  Administered 2018-03-29 – 2018-03-30 (×4): 12.5 mg via ORAL
  Filled 2018-03-29 (×4): qty 1

## 2018-03-29 MED ORDER — FUROSEMIDE 10 MG/ML IJ SOLN
20.0000 mg | Freq: Two times a day (BID) | INTRAMUSCULAR | Status: DC
  Administered 2018-03-29 – 2018-03-31 (×5): 20 mg via INTRAVENOUS
  Filled 2018-03-29 (×5): qty 2

## 2018-03-29 MED ORDER — APIXABAN 5 MG PO TABS
5.0000 mg | ORAL_TABLET | Freq: Two times a day (BID) | ORAL | Status: DC
  Administered 2018-03-29 – 2018-04-05 (×15): 5 mg via ORAL
  Filled 2018-03-29 (×15): qty 1

## 2018-03-29 MED ORDER — IOHEXOL 300 MG/ML  SOLN
15.0000 mL | INTRAMUSCULAR | Status: AC
  Administered 2018-03-29 (×2): 15 mL via ORAL

## 2018-03-29 NOTE — Procedures (Signed)
Interventional Radiology Procedure:   Indications: CHF and ascites  Procedure: US guided paracentesis  Findings: Removed 3 liters of dark yellow ascites  Complications: None     EBL: None  Plan: Return to inpatient room.     Christl Fessenden R. Anselm Pancoast, MD  Pager: [phone]
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
## 2018-03-29 NOTE — Progress Notes (Addendum)
Progress Note  Patient Name: Ariana Riggs Date of Encounter: 03/29/2018  Primary Cardiologist:[facility], Dr. Saunders Revel  Subjective   Still reporting orthopnea and unable to lay flat. She remains SOB and on [place]. She still reports significant abdominal swelling, though she does feel her lower extremity swelling has improved with diuresis. Patient reporting improved urinary output with pyridium. She reported hematuria and was informed that color change occurs on this medication and labs show stable Hgb.  Inpatient Medications    Scheduled Meds: . ALPRAZolam  0.5 mg Oral Once  . apixaban  5 mg Oral BID  . budesonide  0.25 mg Nebulization BID  . buPROPion  150 mg Oral Daily  . citalopram  40 mg Oral Daily  . furosemide  20 mg Intravenous Q12H  . ipratropium-albuterol  3 mL Nebulization Q6H  . metoprolol tartrate  12.5 mg Oral BID  . nicotine  14 mg Transdermal Daily  . pantoprazole  40 mg Oral Daily  . phenazopyridine  100 mg Oral TID WC  . QUEtiapine  200 mg Oral QHS  . sodium chloride flush  3 mL Intravenous Q12H  . sucralfate  1 g Oral QID   Continuous Infusions: . sodium chloride     PRN Meds: sodium chloride, acetaminophen **OR** acetaminophen, albuterol, bisacodyl, guaiFENesin-dextromethorphan, HYDROcodone-acetaminophen, metoprolol tartrate, ondansetron **OR** ondansetron (ZOFRAN) IV, senna-docusate, sodium chloride flush   Vital Signs    Vitals:   03/28/18 1948 03/29/18 0210 03/29/18 0325 03/29/18 0750  BP:   (!) 148/101 (!) 123/91  Pulse:   100 98  Resp:   18   Temp: 97.6 F (36.4 C)  (!) 97.5 F (36.4 C) 97.9 F (36.6 C)  TempSrc:    Oral  SpO2:  98% 97% 98%  Weight:   113.3 kg   Height:        Intake/Output Summary (Last 24 hours) at 03/29/2018 1211 Last data filed at 03/29/2018 6195 Gross per 24 hour  Intake 674.69 ml  Output 1450 ml  Net -775.31 ml   Filed Weights   03/27/18 1714 03/28/18 0500 03/29/18 0325  Weight: 109.7 kg 109 kg 113.3 kg     Telemetry    SR to sinus tachycardia - Personally Reviewed  ECG    No new tracings - Personally Reviewed  Physical Exam   GEN:  Obese female.  No acute distress. Joined by husband and son and having just returned from CT scan.  Eager to be taken off of NPO. Neck:  JVD difficult to assess due to body habitus.  Remains on nasal cannula. Cardiac: RRR, no murmurs, rubs, or gallops.  Respiratory:  Diminished bibasilar breath sounds in the setting of known bilateral pleural effusion per CXR. GI:  Distended, somewhat tender, tense MS: No edema; No deformity. Neuro:  Nonfocal  Psych: Normal affect  Labs    Chemistry Recent Labs  Lab 03/27/18 1724 03/28/18 0059 03/29/18 0314  NA 135 135 135  K 5.4* 4.7 4.5  CL 105 104 101  CO2 22 20* 21*  GLUCOSE 107* 140* 142*  BUN 16 20 41*  CREATININE 0.83 1.33* 1.45*  CALCIUM 8.6* 8.6* 8.9  PROT 6.8  --   --   ALBUMIN 3.6  --   --   AST 42*  --   --   ALT 42  --   --   ALKPHOS 118  --   --   BILITOT 2.2*  --   --   GFRNONAA >60 44* 40*  GFRAA >60 51* 46*  ANIONGAP 8 11 13      Hematology Recent Labs  Lab 03/27/18 1724 03/28/18 0059 03/29/18 0314  WBC 10.0 12.3* 13.9*  RBC 4.61 4.90 4.85  HGB 15.0 16.0* 15.7*  HCT 46.2* 49.5* 48.1*  MCV 100.2* 101.0* 99.2  MCH 32.5 32.7 32.4  MCHC 32.5 32.3 32.6  RDW 15.4 15.6* 15.9*  PLT 236 290 279    Cardiac Enzymes Recent Labs  Lab 03/28/18 0059 03/28/18 0656 03/28/18 1411  TROPONINI 0.05* 0.03* 0.03*   No results for input(s): TROPIPOC in the last 168 hours.   BNP Recent Labs  Lab 03/27/18 1430  BNP 212.0*     DDimer No results for input(s): DDIMER in the last 168 hours.   Radiology    Ct Angio Chest Pe W Or Wo Contrast  Result Date: 03/27/2018 CLINICAL DATA:  Worsening shortness of breath for 2 months. History of COPD. Former smoker. EXAM: CT ANGIOGRAPHY CHEST WITH CONTRAST TECHNIQUE: Multidetector CT imaging of the chest was performed using the standard  protocol during bolus administration of intravenous contrast. Multiplanar CT image reconstructions and MIPs were obtained to evaluate the vascular anatomy. CONTRAST:  42mL ISOVUE-370 IOPAMIDOL (ISOVUE-370) INJECTION 76% COMPARISON:  Chest CT dated 10/14/2016. FINDINGS: Cardiovascular: Cardiomegaly. No pericardial effusion. Distended IVC suggesting RIGHT heart failure. Aortic atherosclerosis. No thoracic aortic aneurysm. Some of the peripheral segmental and subsegmental pulmonary arteries are difficult to definitively characterize due to patient motion artifact, however, there is no convincing pulmonary embolism identified within the main, lobar or central segmental pulmonary arteries. Mediastinum/Nodes: Scattered small and moderately enlarged lymph nodes within the mediastinum and RIGHT hilum, at least mildly progressed compared to the earlier chest CT. Esophagus is difficult to characterize. Trachea is unremarkable. Lungs/Pleura: Dense consolidation within the RIGHT lower lobe, atelectasis versus pneumonia. RIGHT pleural effusion, moderate to large in size. Small LEFT pleural effusion with associated atelectasis. Bilateral interstitial edema. Emphysematous changes within the lung apices, mild to moderate in degree. Upper Abdomen: Limited images of the upper abdomen are unremarkable. Musculoskeletal: Mild degenerative spurring within the lower thoracic spine. No acute or suspicious osseous finding. Median sternotomy wires in place. Review of the MIP images confirms the above findings. IMPRESSION: 1. No pulmonary embolism identified, with mild study limitations detailed above. 2. Dense consolidation within the RIGHT lower lobe, pneumonia versus atelectasis. 3. RIGHT pleural effusion, moderate to large in size, new compared to earlier CT dated 10/14/2016. 4. Small LEFT pleural effusion with associated atelectasis. 5. Cardiomegaly with bilateral pulmonary edema indicating CHF. 6. Mediastinal and RIGHT hilar  lymphadenopathy, at least mildly progressed compared to the earlier chest CT of 05/02/2016. This is most likely reactive in nature, however, neoplastic lymphadenopathy cannot be excluded. Recommend follow-up chest CT in 3 months to ensure stability or resolution. Aortic Atherosclerosis (ICD10-I70.0) and Emphysema (ICD10-J43.9). Electronically Signed   By: Franki Cabot M.D.   On: 03/27/2018 15:00   US Pelvis Limited (transabdominal Only)  Result Date: 03/28/2018 CLINICAL DATA:  Bladder is distension, status post Foley catheter placement. EXAM: LIMITED ULTRASOUND OF PELVIS TECHNIQUE: Limited transabdominal ultrasound examination of the pelvis was performed. COMPARISON:  None. FINDINGS: The bladder is not visualized and presumably is decompressed. Ascites is noted in the pelvis. Foley catheter is not clearly visualized. IMPRESSION: Bladder is not visualized and is presumably decompressed. Foley catheter is not clearly visualized either. Ascites is noted in the pelvis. Electronically Signed   By: Marijo Conception, M.D.   On: 03/28/2018 12:56   Ct  Abdomen Pelvis W Contrast  Result Date: 03/29/2018 CLINICAL DATA:  Urinary retention.  Decreased urine output. EXAM: CT ABDOMEN AND PELVIS WITH CONTRAST TECHNIQUE: Multidetector CT imaging of the abdomen and pelvis was performed using the standard protocol following bolus administration of intravenous contrast. CONTRAST:  60mL OMNIPAQUE IOHEXOL 300 MG/ML  SOLN COMPARISON:  01/03/2018 FINDINGS: Lower chest: Small to moderate and small left pleural effusions noted. There is right lower lobe consolidation with collapse/consolidation posterior left lower lobe. Hepatobiliary: Asymmetric enlargement lateral segment left liver with prominence of the caudate lobe. Liver contour subtly micro nodular. Gallbladder surgically absent. No intrahepatic or extrahepatic biliary dilation. Pancreas: No focal mass lesion. No dilatation of the main duct. No intraparenchymal cyst. No  peripancreatic edema. Spleen: No splenomegaly. No focal mass lesion. Adrenals/Urinary Tract: No adrenal nodule or mass. Delayed excretion of contrast material by the kidneys suggests renal insufficiency. No evidence for hydroureter. Bladder decompressed by Foley catheter. Stomach/Bowel: Stomach is unremarkable. No gastric wall thickening. No evidence of outlet obstruction. Duodenum is normally positioned as is the ligament of Treitz. No small bowel wall thickening. No small bowel dilatation. The terminal ileum is normal. The appendix is normal. No gross colonic mass. No colonic wall thickening. Vascular/Lymphatic: There is abdominal aortic atherosclerosis without aneurysm. Portal vein, superior mesenteric vein, and splenic vein are patent. There is no gastrohepatic or hepatoduodenal ligament lymphadenopathy. No intraperitoneal or retroperitoneal lymphadenopathy. No pelvic sidewall lymphadenopathy. Reproductive: The uterus is unremarkable.  There is no adnexal mass. Other: Moderate volume ascites noted in the abdomen and pelvis. Musculoskeletal: Diffuse body wall edema evident. No worrisome lytic or sclerotic osseous abnormality. IMPRESSION: 1. Small to moderate right with small left pleural effusions associated with bibasilar collapse/consolidation, right lower lobe more than left. 2. Body wall edema with moderate volume ascites. 3. Prominence of the lateral segment left liver and caudate lobe with subtle micro nodularity of liver contour. Imaging features raise concern for underlying cirrhosis. 4. Delayed contrast excretion by the kidneys suggests renal insufficiency. 5.  Aortic Atherosclerois (ICD10-170.0) Electronically Signed   By: Misty Stanley M.D.   On: 03/29/2018 11:57   US Abdomen Limited  Result Date: 03/28/2018 CLINICAL DATA:  Ascites. EXAM: LIMITED ABDOMEN ULTRASOUND FOR ASCITES TECHNIQUE: Limited ultrasound survey for ascites was performed in all four abdominal quadrants. COMPARISON:  CT scan of  January 03, 2018. FINDINGS: Minimal amount of ascites is seen in the right upper quadrant. Mild amount of ascites is noted in the lower quadrants. No definite ascites is noted in left upper quadrant. IMPRESSION: Minimal to mild ascites is noted as described above. Electronically Signed   By: Marijo Conception, M.D.   On: 03/28/2018 12:38   Dg Chest Port 1 View  Result Date: 03/28/2018 CLINICAL DATA:  Cough EXAM: PORTABLE CHEST 1 VIEW COMPARISON:  01/03/2018 FINDINGS: Cardiomegaly with vascular congestion. Bilateral layering effusions and bibasilar atelectasis. Diffuse interstitial prominence, likely interstitial edema. IMPRESSION: Cardiomegaly with vascular congestion and interstitial edema. Layering effusions with bibasilar atelectasis. Electronically Signed   By: Rolm Baptise M.D.   On: 03/28/2018 01:57    Cardiac Studies   Pending echo  Patient Profile     58 y.o. female with a history of hyperlipidemia, COPD, asthma, current smoker, GERD, OSA on CPAP who is being seen today for the evaluation of A. fib with RVR now in SR.  Assessment & Plan    Atrial fibrillation with RVR - Symptoms reported in Afib of rapid HR and SOB, palpitations. Admitted with Afib with RVR  but currently sinus rhythm as converted on own this AM (~7:30AM) on 03/28/2018. Unknown time of onset of Afib and likely triggered / exacerbated by infection as below.  - Echo still pending to assess EF / Afib burden as well as if right heart strain in setting of below pulmonary issues. EKG on 2/12 at time of Afib also showed incomplete RBBB and poor r wave progression. - CHA2DS2VASc score of at least 1 (female) with recommendation for anticoagulation and plan for first dose of Eliquis today. EMR shows paracentesis performed today and after administration of anticoagulation. If future procedures planned, recommend holding for 24h-48h (dependent on procedure) to reduce risk of bleeding and resume 24-48h later, again dependent on  procedure. Recommend CBC tomorrow.  - Rate control as sinus tachycardia with low dose BB. Titrate as HR and BP allow. Suspect elevated rates are multifactorial and in setting of infection, volume overload, and respiratory distress. Workup for peritoneal fluid pending. Hold if bradycardic or hypotensive. Will continue to monitor on telemetry. No known previous h/o Afib per patient and family. No plans for cardioversion given in sinus rhythm.   Troponin elevation - Minimal elevation and in setting of rapid ventricular rate, hypotension, volume overload, infection (negative UA, pending further workup and possible paracentesis) and hypoxia and 2/2 supply demand ischemia. Echo still pending to assess EF and assist with medical management. Continues to deny chest pain. No plan for ischemic evaluation this admission. Consider ischemic evaluation as outpatient and pending improved respiratory status and pending echo.   Respiratory Distress  - Respiratory distress as above in HPI with h/o COPD/asthma / OSA / current smoker / volume overload and in setting of pleural effusions and lymphadenopathy as above with workup per IM. Exam notable for decreased bibasilar breath sounds and rhonchi.  - Workup for abdominal ascites ongoing per review of EMR. In future, recommend holding anticoagulation for 24-48 hours prior to procedure (and depending on risk of procedure) with resumption 1-2 days following and also dependent on procedure.  - Daily BMET. Continue to  monitor renal function, electrolytes, and vitals in setting of IV diuresis. Cr bump with suspicion that at least in part due to volume overload. Will continue to monitor with AM BMET as patient now reporting improved urine output with continued IV diuresis today (improved output since yesterday and since started on pyridium per patient). Hold diuresis if further bump in renal function and in setting of ongoing workup for ascites per EMR review. - Continue gentle  IV diuresis with close monitoring of labs. Daily weights and I/Os. - Oxygen, nebs, and BiPAP as needed for continued SOB. Suspect that earlier rapid ventricular rate contributed to SOB and volume retention and in the setting of the above.  Leukocytosis - Per IM. Trend WBC, fever, lactic acid.   AKI  - Bump in Cr to 1.45 with volume overload still noted. Daily BMET.  Cr baseline 0.86 . Avoid hypotension as will worsen with prerenal AKI. Recommend hold diuresis with further bump in renal function.   COPD / Current Smoker - Smoking cessation advised. On nicotine patch    For questions or updates, please contact [facility] Please consult [email or web address] for contact info under        Signed, Arvil Chaco, PA-C  03/29/2018, 12:11 PM

## 2018-03-29 NOTE — Plan of Care (Signed)
  Problem: Elimination: Goal: Will not experience complications related to bowel motility Outcome: Progressing   

## 2018-03-29 NOTE — Progress Notes (Signed)
[facility] NAME: Ariana Riggs    MR#:  [id number]  DATE OF BIRTH:  November 04, 1960  SUBJECTIVE:   Ariana Riggs was having some urinary frequency and urgency yesterday despite having a Foley catheter.  Improvement with some Pyridium.  Urinalysis is negative.  Also complaining of some abdominal distention and therefore obtain CT abdomen pelvis which showed right ascites with some questionable liver cirrhosis but no other acute pathology.  REVIEW OF SYSTEMS:    Review of Systems  Constitutional: Negative for chills and fever.  HENT: Negative for congestion and tinnitus.   Eyes: Negative for blurred vision and double vision.  Respiratory: Negative for cough, shortness of breath and wheezing.   Cardiovascular: Negative for chest pain, orthopnea and PND.  Gastrointestinal: Negative for abdominal pain, diarrhea, nausea and vomiting.  Genitourinary: Negative for dysuria and hematuria.  Neurological: Negative for dizziness, sensory change and focal weakness.  All other systems reviewed and are negative.   Nutrition: Heart Healthy Tolerating Diet: Yes Tolerating PT: Await Eval.   DRUG ALLERGIES:   Allergies  Allergen Reactions  . Paxil [Paroxetine Hcl] Hives  . Penicillins Hives  . Sulfa Antibiotics Hives    VITALS:  Blood pressure (!) 123/91, pulse 98, temperature 97.9 F (36.6 C), temperature source Oral, resp. rate 18, height 5\' 6"  (1.676 m), weight 113.3 kg, SpO2 98 %.  PHYSICAL EXAMINATION:   Physical Exam  GENERAL:  58 y.o.-year-old obese patient lying in bed in no acute distress.  EYES: Pupils equal, round, reactive to light and accommodation. No scleral icterus. Extraocular muscles intact.  HEENT: Head atraumatic, normocephalic. Oropharynx and nasopharynx clear.  NECK:  Supple, no jugular venous distention. No thyroid enlargement, no tenderness.  LUNGS: Normal breath sounds bilaterally, no wheezing, basilar rales, No rhonchi. No  use of accessory muscles of respiration.  CARDIOVASCULAR: S1, S2 normal. No murmurs, rubs, or gallops.  ABDOMEN: Soft, nontender, distended. Bowel sounds present. No organomegaly or mass.  EXTREMITIES: No cyanosis, clubbing, + 1 edema b/l   NEUROLOGIC: Cranial nerves II through XII are intact. No focal Motor or sensory deficits b/l. Globally weak  PSYCHIATRIC: The patient is alert and oriented x 3.  SKIN: No obvious rash, lesion, or ulcer.    LABORATORY PANEL:   CBC Recent Labs  Lab 03/29/18 0314  WBC 13.9*  HGB 15.7*  HCT 48.1*  PLT 279   ------------------------------------------------------------------------------------------------------------------  Chemistries  Recent Labs  Lab 03/27/18 1724 03/28/18 0059 03/29/18 0314  NA 135 135 135  K 5.4* 4.7 4.5  CL 105 104 101  CO2 22 20* 21*  GLUCOSE 107* 140* 142*  BUN 16 20 41*  CREATININE 0.83 1.33* 1.45*  CALCIUM 8.6* 8.6* 8.9  MG 2.4 2.6*  --   AST 42*  --   --   ALT 42  --   --   ALKPHOS 118  --   --   BILITOT 2.2*  --   --    ------------------------------------------------------------------------------------------------------------------  Cardiac Enzymes Recent Labs  Lab 03/28/18 1411  TROPONINI 0.03*   ------------------------------------------------------------------------------------------------------------------  RADIOLOGY:  Ct Angio Chest Pe W Or Wo Contrast  Result Date: 03/27/2018 CLINICAL DATA:  Worsening shortness of breath for 2 months. History of COPD. Former smoker. EXAM: CT ANGIOGRAPHY CHEST WITH CONTRAST TECHNIQUE: Multidetector CT imaging of the chest was performed using the standard protocol during bolus administration of intravenous contrast. Multiplanar CT image reconstructions and MIPs were obtained to evaluate the vascular anatomy. CONTRAST:  24mL ISOVUE-370 IOPAMIDOL (ISOVUE-370) INJECTION 76% COMPARISON:  Chest CT dated 10/14/2016. FINDINGS: Cardiovascular: Cardiomegaly. No pericardial  effusion. Distended IVC suggesting RIGHT heart failure. Aortic atherosclerosis. No thoracic aortic aneurysm. Some of the peripheral segmental and subsegmental pulmonary arteries are difficult to definitively characterize due to patient motion artifact, however, there is no convincing pulmonary embolism identified within the main, lobar or central segmental pulmonary arteries. Mediastinum/Nodes: Scattered small and moderately enlarged lymph nodes within the mediastinum and RIGHT hilum, at least mildly progressed compared to the earlier chest CT. Esophagus is difficult to characterize. Trachea is unremarkable. Lungs/Pleura: Dense consolidation within the RIGHT lower lobe, atelectasis versus pneumonia. RIGHT pleural effusion, moderate to large in size. Small LEFT pleural effusion with associated atelectasis. Bilateral interstitial edema. Emphysematous changes within the lung apices, mild to moderate in degree. Upper Abdomen: Limited images of the upper abdomen are unremarkable. Musculoskeletal: Mild degenerative spurring within the lower thoracic spine. No acute or suspicious osseous finding. Median sternotomy wires in place. Review of the MIP images confirms the above findings. IMPRESSION: 1. No pulmonary embolism identified, with mild study limitations detailed above. 2. Dense consolidation within the RIGHT lower lobe, pneumonia versus atelectasis. 3. RIGHT pleural effusion, moderate to large in size, new compared to earlier CT dated 10/14/2016. 4. Small LEFT pleural effusion with associated atelectasis. 5. Cardiomegaly with bilateral pulmonary edema indicating CHF. 6. Mediastinal and RIGHT hilar lymphadenopathy, at least mildly progressed compared to the earlier chest CT of 05/02/2016. This is most likely reactive in nature, however, neoplastic lymphadenopathy cannot be excluded. Recommend follow-up chest CT in 3 months to ensure stability or resolution. Aortic Atherosclerosis (ICD10-I70.0) and Emphysema  (ICD10-J43.9). Electronically Signed   By: Franki Cabot M.D.   On: 03/27/2018 15:00   US Pelvis Limited (transabdominal Only)  Result Date: 03/28/2018 CLINICAL DATA:  Bladder is distension, status post Foley catheter placement. EXAM: LIMITED ULTRASOUND OF PELVIS TECHNIQUE: Limited transabdominal ultrasound examination of the pelvis was performed. COMPARISON:  None. FINDINGS: The bladder is not visualized and presumably is decompressed. Ascites is noted in the pelvis. Foley catheter is not clearly visualized. IMPRESSION: Bladder is not visualized and is presumably decompressed. Foley catheter is not clearly visualized either. Ascites is noted in the pelvis. Electronically Signed   By: Marijo Conception, M.D.   On: 03/28/2018 12:56   Ct Abdomen Pelvis W Contrast  Result Date: 03/29/2018 CLINICAL DATA:  Urinary retention.  Decreased urine output. EXAM: CT ABDOMEN AND PELVIS WITH CONTRAST TECHNIQUE: Multidetector CT imaging of the abdomen and pelvis was performed using the standard protocol following bolus administration of intravenous contrast. CONTRAST:  52mL OMNIPAQUE IOHEXOL 300 MG/ML  SOLN COMPARISON:  01/03/2018 FINDINGS: Lower chest: Small to moderate and small left pleural effusions noted. There is right lower lobe consolidation with collapse/consolidation posterior left lower lobe. Hepatobiliary: Asymmetric enlargement lateral segment left liver with prominence of the caudate lobe. Liver contour subtly micro nodular. Gallbladder surgically absent. No intrahepatic or extrahepatic biliary dilation. Pancreas: No focal mass lesion. No dilatation of the main duct. No intraparenchymal cyst. No peripancreatic edema. Spleen: No splenomegaly. No focal mass lesion. Adrenals/Urinary Tract: No adrenal nodule or mass. Delayed excretion of contrast material by the kidneys suggests renal insufficiency. No evidence for hydroureter. Bladder decompressed by Foley catheter. Stomach/Bowel: Stomach is unremarkable. No  gastric wall thickening. No evidence of outlet obstruction. Duodenum is normally positioned as is the ligament of Treitz. No small bowel wall thickening. No small bowel dilatation. The terminal ileum is normal. The appendix is normal.  No gross colonic mass. No colonic wall thickening. Vascular/Lymphatic: There is abdominal aortic atherosclerosis without aneurysm. Portal vein, superior mesenteric vein, and splenic vein are patent. There is no gastrohepatic or hepatoduodenal ligament lymphadenopathy. No intraperitoneal or retroperitoneal lymphadenopathy. No pelvic sidewall lymphadenopathy. Reproductive: The uterus is unremarkable.  There is no adnexal mass. Other: Moderate volume ascites noted in the abdomen and pelvis. Musculoskeletal: Diffuse body wall edema evident. No worrisome lytic or sclerotic osseous abnormality. IMPRESSION: 1. Small to moderate right with small left pleural effusions associated with bibasilar collapse/consolidation, right lower lobe more than left. 2. Body wall edema with moderate volume ascites. 3. Prominence of the lateral segment left liver and caudate lobe with subtle micro nodularity of liver contour. Imaging features raise concern for underlying cirrhosis. 4. Delayed contrast excretion by the kidneys suggests renal insufficiency. 5.  Aortic Atherosclerois (ICD10-170.0) Electronically Signed   By: Misty Stanley M.D.   On: 03/29/2018 11:57   US Abdomen Limited  Result Date: 03/28/2018 CLINICAL DATA:  Ascites. EXAM: LIMITED ABDOMEN ULTRASOUND FOR ASCITES TECHNIQUE: Limited ultrasound survey for ascites was performed in all four abdominal quadrants. COMPARISON:  CT scan of January 03, 2018. FINDINGS: Minimal amount of ascites is seen in the right upper quadrant. Mild amount of ascites is noted in the lower quadrants. No definite ascites is noted in left upper quadrant. IMPRESSION: Minimal to mild ascites is noted as described above. Electronically Signed   By: Marijo Conception, M.D.    On: 03/28/2018 12:38   Dg Chest Port 1 View  Result Date: 03/28/2018 CLINICAL DATA:  Cough EXAM: PORTABLE CHEST 1 VIEW COMPARISON:  01/03/2018 FINDINGS: Cardiomegaly with vascular congestion. Bilateral layering effusions and bibasilar atelectasis. Diffuse interstitial prominence, likely interstitial edema. IMPRESSION: Cardiomegaly with vascular congestion and interstitial edema. Layering effusions with bibasilar atelectasis. Electronically Signed   By: Rolm Baptise M.D.   On: 03/28/2018 01:57     ASSESSMENT AND PLAN:   57 year old female with past medical history of bipolar disorder, COPD, CHF, obstructive sleep apnea, hypertension, hyperlipidemia who presented to the hospital due to shortness of breath and lower extreme edema noted to be in CHF.  1.  CHF- cause of pt's shortness of breath and worsening lower extremity edema. -This was secondary to the patient's A. fib with RVR. -Improved with IV diuresis and cont. IV lasix for now.  About 1 L (-) since admission.  - appreciate Cardiology input.   2.  Atrial fibrillation with rapid ventricular response- patient was initially on a Cardizem drip but now has converted to normal sinus rhythm. -Discussed with cardiology and started on some low-dose metoprolol.  Started on Eliquis and stop Heparin gtt.  - await Echo results.  [facility] input.   3.  Abdominal distention/urinary retention-patient was complaining of some urinary retention and urgency.  Urinalysis is negative for UTI.  Started on some Pyridium and that has improved.  Patient continued to complain of abdominal distention therefore CT abdomen pelvis obtained which showed moderate ascites. -We will attempt doing a ultrasound-guided paracentesis and follow response.  DC Foley catheter.  4.  COPD-no acute exacerbation.  Continue scheduled duo nebs, Pulmicort nebs  5.  Tobacco abuse-continue nicotine patch.  6.  Anxiety/depression-continue Celexa, Wellbutrin.  All the  records are reviewed and case discussed with Care Management/Social Worker. Management plans discussed with the patient, family and they are in agreement.  CODE STATUS: Full code  DVT Prophylaxis: Eliquis  TOTAL TIME TAKING CARE OF THIS PATIENT: 30 minutes.  POSSIBLE D/C IN 1-2 DAYS, DEPENDING ON CLINICAL CONDITION.   Henreitta Leber M.D on 03/29/2018 at 1:22 PM  Between 7am to 6pm - Pager - [phone]  After 6pm go to [email or web address] - [employer] [facility]  Office  [phone]  CC: Primary care physician; Ezequiel Kayser, MD

## 2018-03-30 DIAGNOSIS — J441 Chronic obstructive pulmonary disease with (acute) exacerbation: Secondary | ICD-10-CM

## 2018-03-30 DIAGNOSIS — I5043 Acute on chronic combined systolic (congestive) and diastolic (congestive) heart failure: Secondary | ICD-10-CM

## 2018-03-30 LAB — BASIC METABOLIC PANEL
Anion gap: 7 (ref 5–15)
BUN: 42 mg/dL — ABNORMAL HIGH (ref 6–20)
CO2: 29 mmol/L (ref 22–32)
Calcium: 8.5 mg/dL — ABNORMAL LOW (ref 8.9–10.3)
Chloride: 102 mmol/L (ref 98–111)
Creatinine, Ser: 1.35 mg/dL — ABNORMAL HIGH (ref 0.44–1.00)
GFR calc Af Amer: 50 mL/min — ABNORMAL LOW (ref >60)
GFR calc non Af Amer: 43 mL/min — ABNORMAL LOW (ref >60)
Glucose, Bld: 112 mg/dL — ABNORMAL HIGH (ref 70–99)
Potassium: 4.3 mmol/L (ref 3.5–5.1)
Sodium: 138 mmol/L (ref 135–145)

## 2018-03-30 LAB — CBC
HCT: 46.9 % — ABNORMAL HIGH (ref 36.0–46.0)
Hemoglobin: 15.1 g/dL — ABNORMAL HIGH (ref 12.0–15.0)
MCH: 32.9 pg (ref 26.0–34.0)
MCHC: 32.2 g/dL (ref 30.0–36.0)
MCV: 102.2 fL — ABNORMAL HIGH (ref 80.0–100.0)
Platelets: 216 10*3/uL (ref 150–400)
RBC: 4.59 MIL/uL (ref 3.87–5.11)
RDW: 15.9 % — AB (ref 11.5–15.5)
WBC: 11.3 10*3/uL — ABNORMAL HIGH (ref 4.0–10.5)
nRBC: 0 % (ref 0.0–0.2)

## 2018-03-30 LAB — PH, BODY FLUID: pH, Body Fluid: 7.5

## 2018-03-30 LAB — PATHOLOGIST SMEAR REVIEW

## 2018-03-30 MED ORDER — IPRATROPIUM-ALBUTEROL 0.5-2.5 (3) MG/3ML IN SOLN
3.0000 mL | Freq: Three times a day (TID) | RESPIRATORY_TRACT | Status: DC
  Administered 2018-03-31 – 2018-04-02 (×7): 3 mL via RESPIRATORY_TRACT
  Filled 2018-03-30 (×7): qty 3

## 2018-03-30 MED ORDER — METOPROLOL SUCCINATE ER 25 MG PO TB24
25.0000 mg | ORAL_TABLET | Freq: Every day | ORAL | Status: DC
  Administered 2018-03-31 – 2018-04-05 (×6): 25 mg via ORAL
  Filled 2018-03-30 (×6): qty 1

## 2018-03-30 NOTE — Progress Notes (Signed)
Progress Note  Patient Name: Ariana Riggs Date of Encounter: 03/30/2018  Primary Cardiologist:[facility], Dr. Saunders Revel  Subjective   Significant diuresis overnight 4 L negative Still with significant deep bronchospastic cough Abdomen tight, leg edema, some PND orthopnea  Inpatient Medications    Scheduled Meds: . ALPRAZolam  0.5 mg Oral Once  . apixaban  5 mg Oral BID  . budesonide  0.25 mg Nebulization BID  . buPROPion  150 mg Oral Daily  . citalopram  40 mg Oral Daily  . furosemide  20 mg Intravenous Q12H  . ipratropium-albuterol  3 mL Nebulization Q6H  . metoprolol tartrate  12.5 mg Oral BID  . nicotine  14 mg Transdermal Daily  . pantoprazole  40 mg Oral Daily  . phenazopyridine  100 mg Oral TID WC  . QUEtiapine  200 mg Oral QHS  . sodium chloride flush  3 mL Intravenous Q12H  . sucralfate  1 g Oral QID   Continuous Infusions: . sodium chloride     PRN Meds: sodium chloride, acetaminophen **OR** acetaminophen, albuterol, bisacodyl, guaiFENesin-dextromethorphan, HYDROcodone-acetaminophen, metoprolol tartrate, ondansetron **OR** ondansetron (ZOFRAN) IV, senna-docusate, sodium chloride flush   Vital Signs    Vitals:   03/29/18 1958 03/30/18 0451 03/30/18 0453 03/30/18 0724  BP:   105/80 107/81  Pulse:   88 85  Resp:   20   Temp:    97.7 F (36.5 C)  TempSrc:    Oral  SpO2: 96%  90% 91%  Weight:  109.5 kg    Height:        Intake/Output Summary (Last 24 hours) at 03/30/2018 1032 Last data filed at 03/30/2018 0900 Gross per 24 hour  Intake 571.75 ml  Output 4350 ml  Net -3778.25 ml   Filed Weights   03/28/18 0500 03/29/18 0325 03/30/18 0451  Weight: 109 kg 113.3 kg 109.5 kg    Telemetry    Normal sinus rhythm- Personally Reviewed  ECG    No new tracings - Personally Reviewed  Physical Exam   Constitutional:  oriented to person, place, and time. No distress.  Significant bronchospastic cough HENT:  Head: Grossly normal Eyes:  no discharge. No  scleral icterus.  Neck: Unable to estimate JVD, no carotid bruits  Cardiovascular: Regular rate and rhythm, no murmurs appreciated Pulmonary/Chest: Coarse breath sounds bilaterally, dullness at the bases Abdominal: Soft.  Abdomen distended, dull to percussion, nontender Musculoskeletal: Normal range of motion Neurological:  normal muscle tone. Coordination normal. No atrophy Skin: Skin warm and dry Psychiatric: normal affect, pleasant   Labs    Chemistry Recent Labs  Lab 03/27/18 1724 03/28/18 0059 03/29/18 0314 03/30/18 0313  NA 135 135 135 138  K 5.4* 4.7 4.5 4.3  CL 105 104 101 102  CO2 22 20* 21* 29  GLUCOSE 107* 140* 142* 112*  BUN 16 20 41* 42*  CREATININE 0.83 1.33* 1.45* 1.35*  CALCIUM 8.6* 8.6* 8.9 8.5*  PROT 6.8  --   --   --   ALBUMIN 3.6  --   --   --   AST 42*  --   --   --   ALT 42  --   --   --   ALKPHOS 118  --   --   --   BILITOT 2.2*  --   --   --   GFRNONAA >60 44* 40* 43*  GFRAA >60 51* 46* 50*  ANIONGAP 8 11 13 7      Hematology Recent Labs  Lab 03/28/18 0059 03/29/18 0314 03/30/18 0313  WBC 12.3* 13.9* 11.3*  RBC 4.90 4.85 4.59  HGB 16.0* 15.7* 15.1*  HCT 49.5* 48.1* 46.9*  MCV 101.0* 99.2 102.2*  MCH 32.7 32.4 32.9  MCHC 32.3 32.6 32.2  RDW 15.6* 15.9* 15.9*  PLT 290 279 216    Cardiac Enzymes Recent Labs  Lab 03/28/18 0059 03/28/18 0656 03/28/18 1411  TROPONINI 0.05* 0.03* 0.03*   No results for input(s): TROPIPOC in the last 168 hours.   BNP Recent Labs  Lab 03/27/18 1430  BNP 212.0*     DDimer No results for input(s): DDIMER in the last 168 hours.   Radiology    US Pelvis Limited (transabdominal Only)  Result Date: 03/28/2018 CLINICAL DATA:  Bladder is distension, status post Foley catheter placement. EXAM: LIMITED ULTRASOUND OF PELVIS TECHNIQUE: Limited transabdominal ultrasound examination of the pelvis was performed. COMPARISON:  None. FINDINGS: The bladder is not visualized and presumably is decompressed.  Ascites is noted in the pelvis. Foley catheter is not clearly visualized. IMPRESSION: Bladder is not visualized and is presumably decompressed. Foley catheter is not clearly visualized either. Ascites is noted in the pelvis. Electronically Signed   By: Marijo Conception, M.D.   On: 03/28/2018 12:56   Ct Abdomen Pelvis W Contrast  Result Date: 03/29/2018 CLINICAL DATA:  Urinary retention.  Decreased urine output. EXAM: CT ABDOMEN AND PELVIS WITH CONTRAST TECHNIQUE: Multidetector CT imaging of the abdomen and pelvis was performed using the standard protocol following bolus administration of intravenous contrast. CONTRAST:  77mL OMNIPAQUE IOHEXOL 300 MG/ML  SOLN COMPARISON:  01/03/2018 FINDINGS: Lower chest: Small to moderate and small left pleural effusions noted. There is right lower lobe consolidation with collapse/consolidation posterior left lower lobe. Hepatobiliary: Asymmetric enlargement lateral segment left liver with prominence of the caudate lobe. Liver contour subtly micro nodular. Gallbladder surgically absent. No intrahepatic or extrahepatic biliary dilation. Pancreas: No focal mass lesion. No dilatation of the main duct. No intraparenchymal cyst. No peripancreatic edema. Spleen: No splenomegaly. No focal mass lesion. Adrenals/Urinary Tract: No adrenal nodule or mass. Delayed excretion of contrast material by the kidneys suggests renal insufficiency. No evidence for hydroureter. Bladder decompressed by Foley catheter. Stomach/Bowel: Stomach is unremarkable. No gastric wall thickening. No evidence of outlet obstruction. Duodenum is normally positioned as is the ligament of Treitz. No small bowel wall thickening. No small bowel dilatation. The terminal ileum is normal. The appendix is normal. No gross colonic mass. No colonic wall thickening. Vascular/Lymphatic: There is abdominal aortic atherosclerosis without aneurysm. Portal vein, superior mesenteric vein, and splenic vein are patent. There is no  gastrohepatic or hepatoduodenal ligament lymphadenopathy. No intraperitoneal or retroperitoneal lymphadenopathy. No pelvic sidewall lymphadenopathy. Reproductive: The uterus is unremarkable.  There is no adnexal mass. Other: Moderate volume ascites noted in the abdomen and pelvis. Musculoskeletal: Diffuse body wall edema evident. No worrisome lytic or sclerotic osseous abnormality. IMPRESSION: 1. Small to moderate right with small left pleural effusions associated with bibasilar collapse/consolidation, right lower lobe more than left. 2. Body wall edema with moderate volume ascites. 3. Prominence of the lateral segment left liver and caudate lobe with subtle micro nodularity of liver contour. Imaging features raise concern for underlying cirrhosis. 4. Delayed contrast excretion by the kidneys suggests renal insufficiency. 5.  Aortic Atherosclerois (ICD10-170.0) Electronically Signed   By: Misty Stanley M.D.   On: 03/29/2018 11:57   US Abdomen Limited  Result Date: 03/28/2018 CLINICAL DATA:  Ascites. EXAM: LIMITED ABDOMEN ULTRASOUND FOR ASCITES TECHNIQUE: Limited  ultrasound survey for ascites was performed in all four abdominal quadrants. COMPARISON:  CT scan of January 03, 2018. FINDINGS: Minimal amount of ascites is seen in the right upper quadrant. Mild amount of ascites is noted in the lower quadrants. No definite ascites is noted in left upper quadrant. IMPRESSION: Minimal to mild ascites is noted as described above. Electronically Signed   By: Marijo Conception, M.D.   On: 03/28/2018 12:38   US Paracentesis  Result Date: 03/29/2018 INDICATION: CHF with abdominal distension and ascites. Request for paracentesis. EXAM: ULTRASOUND GUIDED PARACENTESIS MEDICATIONS: None. COMPLICATIONS: None immediate. PROCEDURE: Informed written consent was obtained from the patient after a discussion of the risks, benefits and alternatives to treatment. A timeout was performed prior to the initiation of the procedure.  Initial ultrasound scanning demonstrates a large amount of ascites within the left lower abdominal quadrant. The left lower abdomen was prepped and draped in the usual sterile fashion. 1% lidocaine was used for local anesthesia. Following this, a 6 Fr Safe-T-Centesis catheter was introduced. An ultrasound image was saved for documentation purposes. The paracentesis was performed. The catheter was removed and a dressing was applied. The patient tolerated the procedure well without immediate post procedural complication. FINDINGS: A total of approximately 3 L of dark yellow fluid was removed. Samples were sent to the laboratory as requested by the clinical team. IMPRESSION: Successful ultrasound-guided paracentesis yielding 3 liters of peritoneal fluid. Electronically Signed   By: Markus Daft M.D.   On: 03/29/2018 15:36    Cardiac Studies   Pending echo  Patient Profile     58 y.o. female with a history of hyperlipidemia, COPD, asthma, current smoker, GERD, OSA on CPAP who is being seen today for the evaluation of A. fib with RVR now in SR.  Assessment & Plan    A/P: Acute respiratory distress Multifactorial including COPD/asthma, sleep apnea, COPD exacerbation Systolic dysfunction/pulmonary edema, MR and TR, ascites from heart failure ---Echocardiogram reviewed with her in detail showing moderately depressed LV function and RV function significant mitral valve regurgitation tricuspid valve regurgitation, pulmonary hypertension ----Would continue Lasix IV twice daily at current dose.  4 L negative in the past 24 hours Long discussion with her concerning monitoring for diet, decreasing her fluid intake  Elevated troponin In the setting of atrial fibrillation with RVR, cardiomyopathy, COPD exacerbation Will need to consider ischemic work-up once able to tolerate supine position. Severe bronchospastic cough, unable to lay flat  Atrial fibrillation with RVR Converted to normal sinus  rhythm Continue on heparin for now in case procedures needed At the time of discharge would start Eliquis twice daily  Acute renal failure Renal function stable to slightly improved with diuresis Echocardiogram clearly showing depressed ejection fraction fluid overload, Also with ascites and leg edema -We will continue to monitor closely, continue Lasix twice daily   Total encounter time more than 25 minutes  Greater than 50% was spent in counseling and coordination of care with the patient   For questions or updates, please contact [facility] Please consult [email or web address] for contact info under        Signed, [name], MD  03/30/2018, 10:32 AM

## 2018-03-30 NOTE — Progress Notes (Signed)
Walked patient on the hall for the first time. Patient was in 3L of oxygen [place]. Patient is SOB and desat to 85% when she does not take deep breaths but maintained above 90% otherwise. Patient was also wobbly while walking, probably will benefit with PT to evaluate. She does walked to the bathroom by herself with the family, she refuses bed alarm. RN will continue to monitor.

## 2018-03-30 NOTE — Care Management Note (Addendum)
Case Management Note  Patient Details  Name: Ariana Riggs MRN: [id number] Date of Birth: [birth year]  Subjective/Objective:    Feeling much better this morning.  Oxygen down to 2L.  Offered HH services and patient is declining.  She states she has been up to the bathroom without difficulty.  [facility] referral made as she is on the registry.  Has been receiving education.  Husband is going to pick up a scale.  Encouraged patient to keep Heart Failure appointment.  No further needs at this time.                  Action/Plan:   Expected Discharge Date:                  Expected Discharge Plan:  Home/Self Care  In-House Referral:  [facility]  Discharge planning Services  CM Consult, HF Clinic  Post Acute Care Choice:    Choice offered to:     DME Arranged:    DME Agency:     HH Arranged:  Patient Refused [facility] Agency:     Status of Service:  Completed, signed off  If discussed at [employer] of Stay Meetings, dates discussed:    Additional Comments:  Elza Rafter, RN 03/30/2018, 9:33 AM

## 2018-03-30 NOTE — Progress Notes (Addendum)
Cardiovascular and Pulmonary Nurse Navigator Note:    58 year old female with past medical history of bipolar disorder, COPD, CHF, OSA, HTN, HLD who presented to the hospital due to SOB and lower extremity edema due to CHF.    Active Problem List: 1. CHF  2. Afib RVR 3. Abdomen distention / urinary retention - Patient had ultrasound guided paracentesis yesterday with 3L of fluid removed.   4. Chronic COPD 5. Tobacco abuse - nicotine patch while hospitalized.  6. Anxiety / depression - patient on Celexa and Wellbutrin.   CHF Education:   Rounded on patient.  Daughter-in-law at bedside.  Patient gave permission for this RN to speak about her medical condition in front of her daughter-in-law.   ???? Educational session with patient/ daughter completed.  Patient reported her husband has taken the "Living Better with Heart Failure" packet home.   Briefly reviewed definition of heart failure and signs and symptoms of an exacerbation.?Explained to patient that HF is a chronic illness which requires self-assessment / self-management along with help from the cardiologist/PCP.?? ? *Reviewed importance of and reason behind checking weight daily in the AM, after using the bathroom, but before getting dressed. Patient will purchase scales.   ? *Reviewed with patient the following information: *Discussed when to call the Dr= weight gain of >2-3lb overnight of 5lb in a week,  *Discussed yellow zone= call MD: weight gain of >2-3lb overnight of 5lb in a week, increased swelling, increased SOB when lying down, chest discomfort, dizziness, increased fatigue *Red Zone= call 911: struggle to breath, fainting or near fainting, significant chest pain   *Diet - Reviewed low sodium diet-provided handout of recommended and not recommended foods. Dietitian Consultation for education completed on 03/28/2018. Patient's daughter-in-law wanting more information on low sodium heart healthy diet.  Provided patient and family  with the following information: *ADA Heart Healthy Nutrition Therapy *List of Sodium Content of foods *Heart Healthy Label Reading Tips.    ? *Discussed fluid intake with patient as well. Patient not currently on a fluid restriction, but advised no more than 64 ounces of fluid per day.? ? *Instructed patient to take medications as prescribed for heart failure. Explained briefly why pt is on the medications (either make you feel better, live longer or keep you out of the hospital) and discussed monitoring and side effects.  ? *Discussed exercise. Informed patient and daughter-in-law per Medicare guidelines Dx of CHF with EF of 356% or less, patient is candidate for Cardiac Rehab.  Informed patient and daughter-in-law if patient needs in-home PT at time of discharge then Cardiac Rehab would be after completing in-home PT.  Brochure provided.   ? *Smoking Cessation- Patient is a current every day smoker.  Nicotine patch while hospitalized.   ? *[facility] - Explained the purpose of the HF Clinic. Explained to patient the HF Clinic does not replace PCP nor Cardiologist, but is an additional resource to helping patient manage heart failure at home. Brochure provided.  Patient agreeable to being followed in the [facility].  Appointment scheduled for 04/10/2018 at 10:00 a.m.    Again, the 5 Steps to Living Better with Heart Failure were reviewed with patient.  [facility] about support group provided. Brochure given. Patient invited and encouraged to attend. Next meeting 04/25/2018 at 11:30 a.m. Brochure provided.    Patient / daughter-in-law thanked me for providing the above information. ? ? Roanna Epley, RN, BSN, [facility]? [facility]  [facility] Cardiac &?Pulmonary Rehab  Cardiovascular &?Pulmonary Nurse Navigator  Direct Line: [phone]  Department Phone #: [phone] Fax: [phone]? Email Address:  [email or web address]

## 2018-03-30 NOTE — Progress Notes (Signed)
[facility] [place] NAME: Ariana Riggs    MR#:  [id number]  DATE OF BIRTH:  March 02, 1960  SUBJECTIVE:   Overall feels much better since yesterday.  Status post ultrasound-guided paracentesis yesterday with 3 L of fluid removed.  Diuresing well with IV Lasix.  Family is at bedside.  Remains in sinus rhythm.  REVIEW OF SYSTEMS:    Review of Systems  Constitutional: Negative for chills and fever.  HENT: Negative for congestion and tinnitus.   Eyes: Negative for blurred vision and double vision.  Respiratory: Negative for cough, shortness of breath and wheezing.   Cardiovascular: Positive for leg swelling. Negative for chest pain, orthopnea and PND.  Gastrointestinal: Negative for abdominal pain, diarrhea, nausea and vomiting.  Genitourinary: Negative for dysuria and hematuria.  Neurological: Negative for dizziness, sensory change and focal weakness.  All other systems reviewed and are negative.   Nutrition: Heart Healthy Tolerating Diet: Yes Tolerating PT: Await Eval.   DRUG ALLERGIES:   Allergies  Allergen Reactions  . Paxil [Paroxetine Hcl] Hives  . Penicillins Hives  . Sulfa Antibiotics Hives    VITALS:  Blood pressure 107/81, pulse 85, temperature 97.7 F (36.5 C), temperature source Oral, resp. rate 20, height 5\' 6"  (1.676 m), weight 109.5 kg, SpO2 95 %.  PHYSICAL EXAMINATION:   Physical Exam  GENERAL:  58 y.o.-year-old obese patient lying in bed in no acute distress.  EYES: Pupils equal, round, reactive to light and accommodation. No scleral icterus. Extraocular muscles intact.  HEENT: Head atraumatic, normocephalic. Oropharynx and nasopharynx clear.  NECK:  Supple, no jugular venous distention. No thyroid enlargement, no tenderness.  LUNGS: Normal breath sounds bilaterally, no wheezing, basilar rales, No rhonchi. No use of accessory muscles of respiration.  CARDIOVASCULAR: S1, S2 normal. No murmurs, rubs, or gallops.    ABDOMEN: Soft, nontender, distended. Bowel sounds present. No organomegaly or mass.  EXTREMITIES: No cyanosis, clubbing, + 1 edema b/l   NEUROLOGIC: Cranial nerves II through XII are intact. No focal Motor or sensory deficits b/l. PSYCHIATRIC: The patient is alert and oriented x 3.  SKIN: No obvious rash, lesion, or ulcer.    LABORATORY PANEL:   CBC Recent Labs  Lab 03/30/18 0313  WBC 11.3*  HGB 15.1*  HCT 46.9*  PLT 216   ------------------------------------------------------------------------------------------------------------------  Chemistries  Recent Labs  Lab 03/27/18 1724 03/28/18 0059  03/30/18 0313  NA 135 135   < > 138  K 5.4* 4.7   < > 4.3  CL 105 104   < > 102  CO2 22 20*   < > 29  GLUCOSE 107* 140*   < > 112*  BUN 16 20   < > 42*  CREATININE 0.83 1.33*   < > 1.35*  CALCIUM 8.6* 8.6*   < > 8.5*  MG 2.4 2.6*  --   --   AST 42*  --   --   --   ALT 42  --   --   --   ALKPHOS 118  --   --   --   BILITOT 2.2*  --   --   --    < > = values in this interval not displayed.   ------------------------------------------------------------------------------------------------------------------  Cardiac Enzymes Recent Labs  Lab 03/28/18 1411  TROPONINI 0.03*   ------------------------------------------------------------------------------------------------------------------  RADIOLOGY:  Ct Abdomen Pelvis W Contrast  Result Date: 03/29/2018 CLINICAL DATA:  Urinary retention.  Decreased urine output. EXAM: CT ABDOMEN AND  PELVIS WITH CONTRAST TECHNIQUE: Multidetector CT imaging of the abdomen and pelvis was performed using the standard protocol following bolus administration of intravenous contrast. CONTRAST:  55mL OMNIPAQUE IOHEXOL 300 MG/ML  SOLN COMPARISON:  01/03/2018 FINDINGS: Lower chest: Small to moderate and small left pleural effusions noted. There is right lower lobe consolidation with collapse/consolidation posterior left lower lobe. Hepatobiliary:  Asymmetric enlargement lateral segment left liver with prominence of the caudate lobe. Liver contour subtly micro nodular. Gallbladder surgically absent. No intrahepatic or extrahepatic biliary dilation. Pancreas: No focal mass lesion. No dilatation of the main duct. No intraparenchymal cyst. No peripancreatic edema. Spleen: No splenomegaly. No focal mass lesion. Adrenals/Urinary Tract: No adrenal nodule or mass. Delayed excretion of contrast material by the kidneys suggests renal insufficiency. No evidence for hydroureter. Bladder decompressed by Foley catheter. Stomach/Bowel: Stomach is unremarkable. No gastric wall thickening. No evidence of outlet obstruction. Duodenum is normally positioned as is the ligament of Treitz. No small bowel wall thickening. No small bowel dilatation. The terminal ileum is normal. The appendix is normal. No gross colonic mass. No colonic wall thickening. Vascular/Lymphatic: There is abdominal aortic atherosclerosis without aneurysm. Portal vein, superior mesenteric vein, and splenic vein are patent. There is no gastrohepatic or hepatoduodenal ligament lymphadenopathy. No intraperitoneal or retroperitoneal lymphadenopathy. No pelvic sidewall lymphadenopathy. Reproductive: The uterus is unremarkable.  There is no adnexal mass. Other: Moderate volume ascites noted in the abdomen and pelvis. Musculoskeletal: Diffuse body wall edema evident. No worrisome lytic or sclerotic osseous abnormality. IMPRESSION: 1. Small to moderate right with small left pleural effusions associated with bibasilar collapse/consolidation, right lower lobe more than left. 2. Body wall edema with moderate volume ascites. 3. Prominence of the lateral segment left liver and caudate lobe with subtle micro nodularity of liver contour. Imaging features raise concern for underlying cirrhosis. 4. Delayed contrast excretion by the kidneys suggests renal insufficiency. 5.  Aortic Atherosclerois (ICD10-170.0) Electronically  Signed   By: Misty Stanley M.D.   On: 03/29/2018 11:57   US Paracentesis  Result Date: 03/29/2018 INDICATION: CHF with abdominal distension and ascites. Request for paracentesis. EXAM: ULTRASOUND GUIDED PARACENTESIS MEDICATIONS: None. COMPLICATIONS: None immediate. PROCEDURE: Informed written consent was obtained from the patient after a discussion of the risks, benefits and alternatives to treatment. A timeout was performed prior to the initiation of the procedure. Initial ultrasound scanning demonstrates a large amount of ascites within the left lower abdominal quadrant. The left lower abdomen was prepped and draped in the usual sterile fashion. 1% lidocaine was used for local anesthesia. Following this, a 6 Fr Safe-T-Centesis catheter was introduced. An ultrasound image was saved for documentation purposes. The paracentesis was performed. The catheter was removed and a dressing was applied. The patient tolerated the procedure well without immediate post procedural complication. FINDINGS: A total of approximately 3 L of dark yellow fluid was removed. Samples were sent to the laboratory as requested by the clinical team. IMPRESSION: Successful ultrasound-guided paracentesis yielding 3 liters of peritoneal fluid. Electronically Signed   By: Markus Daft M.D.   On: 03/29/2018 15:36     ASSESSMENT AND PLAN:   58 year old female with past medical history of bipolar disorder, COPD, CHF, obstructive sleep apnea, hypertension, hyperlipidemia who presented to the hospital due to shortness of breath and lower extreme edema noted to be in CHF.  1.  CHF- cause of pt's shortness of breath and worsening lower extremity edema. -This was secondary to the patient's A. fib with RVR. -Improved with IV diuresis and cont. IV  lasix for now.  About 4 L (-) since admission.  -Continue low-dose Toprol.  2.  Atrial fibrillation with rapid ventricular response- patient was initially on a Cardizem drip but now has converted to  normal sinus rhythm. Continue low-dose Toprol, given LV dysfunction.  Continue Eliquis.  Appreciate cardiology input.   3.  Abdominal distention/urinary retention-patient was complaining of some urinary retention and urgency.  Urinalysis is negative for UTI.  Started on some Pyridium and that has improved.  Patient continued to complain of abdominal distention therefore CT abdomen pelvis obtained which showed moderate ascites. -There is post ultrasound-guided paracentesis yesterday with 3 L of fluid removed.  Abdominal distention improved.  We will continue to monitor.  May need repeat paracentesis during this hospitalization.  4.  COPD-no acute exacerbation.  Continue scheduled duo nebs, Pulmicort nebs  5.  Tobacco abuse-continue nicotine patch.  6.  Anxiety/depression-continue Celexa, Wellbutrin.  All the records are reviewed and case discussed with Care Management/Social Worker. Management plans discussed with the patient, family and they are in agreement.  CODE STATUS: Full code  DVT Prophylaxis: Eliquis  TOTAL TIME TAKING CARE OF THIS PATIENT: 30 minutes.   POSSIBLE D/C IN 2-3 DAYS, DEPENDING ON CLINICAL CONDITION.   Henreitta Leber M.D on 03/30/2018 at 1:28 PM  Between 7am to 6pm - Pager - [phone]  After 6pm go to [email or web address] - [employer] [facility]  Office  [phone]  CC: Primary care physician; Ezequiel Kayser, MD

## 2018-03-30 NOTE — Progress Notes (Signed)
Cardiovascular and Pulmonary Nurse Navigator Note:    Rounded on patient.  Patient with visitor in the room.  Will return to see patient later this afternoon.    Roanna Epley, RN, BSN, [place] Cardiac & Pulmonary Rehab  Cardiovascular & Pulmonary Nurse Navigator  Direct Line: [phone]  Department Phone #: [phone] Fax: [phone]  Email Address: [email or web address]

## 2018-03-31 DIAGNOSIS — I272 Pulmonary hypertension, unspecified: Secondary | ICD-10-CM

## 2018-03-31 LAB — BASIC METABOLIC PANEL
Anion gap: 9 (ref 5–15)
BUN: 34 mg/dL — ABNORMAL HIGH (ref 6–20)
CO2: 26 mmol/L (ref 22–32)
Calcium: 8.6 mg/dL — ABNORMAL LOW (ref 8.9–10.3)
Chloride: 102 mmol/L (ref 98–111)
Creatinine, Ser: 0.99 mg/dL (ref 0.44–1.00)
GFR calc Af Amer: >60 mL/min (ref >60)
GFR calc non Af Amer: >60 mL/min (ref >60)
Glucose, Bld: 98 mg/dL (ref 70–99)
Potassium: 4.3 mmol/L (ref 3.5–5.1)
Sodium: 137 mmol/L (ref 135–145)

## 2018-03-31 MED ORDER — FUROSEMIDE 10 MG/ML IJ SOLN
20.0000 mg | Freq: Once | INTRAMUSCULAR | Status: AC
  Administered 2018-03-31: 20 mg via INTRAVENOUS
  Filled 2018-03-31: qty 2

## 2018-03-31 MED ORDER — FUROSEMIDE 10 MG/ML IJ SOLN
40.0000 mg | Freq: Two times a day (BID) | INTRAMUSCULAR | Status: DC
  Administered 2018-03-31 – 2018-04-03 (×6): 40 mg via INTRAVENOUS
  Filled 2018-03-31 (×6): qty 4

## 2018-03-31 NOTE — Progress Notes (Signed)
Cardiovascular and Pulmonary Nurse Navigator Note:    Rounded on patient.  Follow-up to education provided on 03/30/2018.   Patient sitting up in recliner chair.  Husband at bedside at this time.  During previous educational session husband had not been present. Reviewed with patient and husband with "Living Better with Heart Failure" packet. Briefly reviewed definition of heart failure and signs and symptoms of an exacerbation.?Explained to patient/husband that HF is a chronic illness which requires self-assessment / self-management along with help from the cardiologist/PCP.?? ? *Reviewed importance of and reason behind checking weight daily in the AM, after using the bathroom, but before getting dressed. Patient to purchase scales.    ? *Reviewed with patient the following information: *Discussed when to call the Dr= weight gain of >2-3lb overnight of 5lb in a week,  *Discussed yellow zone= call MD: weight gain of >2-3lb overnight of 5lb in a week, increased swelling, increased SOB when lying down, chest discomfort, dizziness, increased fatigue *Red Zone= call 911: struggle to breath, fainting or near fainting, significant chest pain   *Diet - Reviewed low sodium diet-provided handout of recommended and not recommended foods yesterday.  Dietitian Consultation for education has been completed this admission.   Patient's husband did state that patient wanted husband to go out and get a hot dog for her to eat last night, but husband refused.   ? *Discussed fluid intake with patient as well. Patient not currently on a fluid restriction, but advised no more 64 ounces of fluid (total fluid) per day.   ? *Instructed patient to take medications as prescribed for heart failure. ? *Discussed exercise. Too early to know if patient will need HH PT or if she can start Cardiac Rehab:  Dx CHF with EF 30 - 35%.   ? *Smoking Cessation- Patient stated she quit smoking over 2 weeks ago.  ? *[facility] - Explained the purpose of the HF Clinic. ?Explained to patient the HF Clinic does not replace PCP nor Cardiologist, but is an additional resource to helping patient manage heart failure at home. New patient appt on 04/10/2018 at 10 a.m.     Patient's husband wanting to know what is the stage of patient's HF and what to expect going forward.  He realizes this may be too soon for [place] to know.  I explained while patient is diuresing and her heart rhythm is now back in NSR, patient still needs an ischemic work-up to determine if there are any blockages in her coronary arteries contributing to her HF.  I encouraged patient and husband to ask the Cardiologist this question.  Instructed husband to make a list of questions for Cardiologist.    Patient's husband brought up a concern about patient not being able to work and being self employed.  She is currently on his health insurance.  Patient's husband was wondering if she could apply for SSI / Medicaid / Disability. This RN discussed with RNCM who contacted [place] at the hospital to meet with the patient and husband if not today then first thing Monday morning.   Husband engaged in education and took notes.    Patient and husband appreciative of the information.    ? Roanna Epley, RN, BSN, [facility]? [place] Cardiac &?Pulmonary Rehab  Cardiovascular &?Pulmonary Nurse Navigator  Direct Line: [phone]  Department Phone #: [phone] Fax: [phone]? Email Address: [email or web address]

## 2018-03-31 NOTE — Progress Notes (Signed)
[facility] [place] NAME: Ariana Riggs    MR#:  [id number]  DATE OF BIRTH:  February 15, 1961  SUBJECTIVE:   Responding well to IV diuresis.  No other acute events overnight.  Patient denies any chest pains, nausea, vomiting or any other complaints presently.  REVIEW OF SYSTEMS:    Review of Systems  Constitutional: Negative for chills and fever.  HENT: Negative for congestion and tinnitus.   Eyes: Negative for blurred vision and double vision.  Respiratory: Negative for cough, shortness of breath and wheezing.   Cardiovascular: Positive for leg swelling. Negative for chest pain, orthopnea and PND.  Gastrointestinal: Negative for abdominal pain, diarrhea, nausea and vomiting.  Genitourinary: Negative for dysuria and hematuria.  Neurological: Negative for dizziness, sensory change and focal weakness.  All other systems reviewed and are negative.   Nutrition: Heart Healthy Tolerating Diet: Yes Tolerating PT: Eval noted   DRUG ALLERGIES:   Allergies  Allergen Reactions  . Paxil [Paroxetine Hcl] Hives  . Penicillins Hives  . Sulfa Antibiotics Hives    VITALS:  Blood pressure 105/77, pulse 76, temperature 97.7 F (36.5 C), temperature source Oral, resp. rate 18, height 5\' 6"  (1.676 m), weight 109.9 kg, SpO2 93 %.  PHYSICAL EXAMINATION:   Physical Exam  GENERAL:  58 y.o.-year-old obese patient lying in bed in no acute distress.  EYES: Pupils equal, round, reactive to light and accommodation. No scleral icterus. Extraocular muscles intact.  HEENT: Head atraumatic, normocephalic. Oropharynx and nasopharynx clear.  NECK:  Supple, + jugular venous distention. No thyroid enlargement, no tenderness.  LUNGS: Normal breath sounds bilaterally, no wheezing, basilar rales, No rhonchi. No use of accessory muscles of respiration.  CARDIOVASCULAR: S1, S2 normal. No murmurs, rubs, or gallops.  ABDOMEN: Soft, nontender, distended. Bowel sounds  present. No organomegaly or mass.  EXTREMITIES: No cyanosis, clubbing, + 1-2 edema b/l   NEUROLOGIC: Cranial nerves II through XII are intact. No focal Motor or sensory deficits b/l. PSYCHIATRIC: The patient is alert and oriented x 3.  SKIN: No obvious rash, lesion, or ulcer.    LABORATORY PANEL:   CBC Recent Labs  Lab 03/30/18 0313  WBC 11.3*  HGB 15.1*  HCT 46.9*  PLT 216   ------------------------------------------------------------------------------------------------------------------  Chemistries  Recent Labs  Lab 03/27/18 1724 03/28/18 0059  03/31/18 0302  NA 135 135   < > 137  K 5.4* 4.7   < > 4.3  CL 105 104   < > 102  CO2 22 20*   < > 26  GLUCOSE 107* 140*   < > 98  BUN 16 20   < > 34*  CREATININE 0.83 1.33*   < > 0.99  CALCIUM 8.6* 8.6*   < > 8.6*  MG 2.4 2.6*  --   --   AST 42*  --   --   --   ALT 42  --   --   --   ALKPHOS 118  --   --   --   BILITOT 2.2*  --   --   --    < > = values in this interval not displayed.   ------------------------------------------------------------------------------------------------------------------  Cardiac Enzymes Recent Labs  Lab 03/28/18 1411  TROPONINI 0.03*   ------------------------------------------------------------------------------------------------------------------  RADIOLOGY:  US Paracentesis  Result Date: 03/29/2018 INDICATION: CHF with abdominal distension and ascites. Request for paracentesis. EXAM: ULTRASOUND GUIDED PARACENTESIS MEDICATIONS: None. COMPLICATIONS: None immediate. PROCEDURE: Informed written consent was obtained from the  patient after a discussion of the risks, benefits and alternatives to treatment. A timeout was performed prior to the initiation of the procedure. Initial ultrasound scanning demonstrates a large amount of ascites within the left lower abdominal quadrant. The left lower abdomen was prepped and draped in the usual sterile fashion. 1% lidocaine was used for local  anesthesia. Following this, a 6 Fr Safe-T-Centesis catheter was introduced. An ultrasound image was saved for documentation purposes. The paracentesis was performed. The catheter was removed and a dressing was applied. The patient tolerated the procedure well without immediate post procedural complication. FINDINGS: A total of approximately 3 L of dark yellow fluid was removed. Samples were sent to the laboratory as requested by the clinical team. IMPRESSION: Successful ultrasound-guided paracentesis yielding 3 liters of peritoneal fluid. Electronically Signed   By: Markus Daft M.D.   On: 03/29/2018 15:36     ASSESSMENT AND PLAN:   58 year old female with past medical history of bipolar disorder, COPD, CHF, obstructive sleep apnea, hypertension, hyperlipidemia who presented to the hospital due to shortness of breath and lower extreme edema noted to be in CHF.  1.  CHF- cause of pt's shortness of breath and worsening lower extremity edema. - acute systolic dysfunction as pt's EF of 30-35%.   -Continue IV Lasix and will increase dose.  About 7 L negative since admission.  Responding well to IV diuresis. -Continue low-dose Toprol.  Consider ACE once BP a bit better as it's still a bit soft.    2.  Atrial fibrillation with rapid ventricular response- patient was initially on a Cardizem drip but now has converted to normal sinus rhythm. - Continue low-dose Toprol. Continue Eliquis.    3.  Abdominal distention/urinary retention-patient was complaining of some urinary retention and urgency.  Urinalysis was negative for UTI. Improved and will d/c pyridium for now.   - this was due to ascites and pt. Is s/p paracentesis 2/12 with 3 L of fluid removed.  Abdominal distention improved and will cont. To monitor.   4.  COPD-no acute exacerbation.  Continue scheduled duo nebs, Pulmicort nebs  5.  Tobacco abuse-continue nicotine patch.  6.  Anxiety/depression-continue Celexa, Wellbutrin.  Cont. PT as  tolerated.   All the records are reviewed and case discussed with Care Management/Social Worker. Management plans discussed with the patient, family and they are in agreement.  CODE STATUS: Full code  DVT Prophylaxis: Eliquis  TOTAL TIME TAKING CARE OF THIS PATIENT: 30 minutes.   POSSIBLE D/C IN 2-3 DAYS, DEPENDING ON CLINICAL CONDITION.   Henreitta Leber M.D on 03/31/2018 at 12:26 PM  Between 7am to 6pm - Pager - [phone]  After 6pm go to [email or web address] - [employer] [facility]  Office  [phone]  CC: Primary care physician; Ezequiel Kayser, MD

## 2018-03-31 NOTE — Care Management (Signed)
Called and left a message with [facility].  Asked her to come to patient room and assist patient with filling out a Medicaid application.

## 2018-03-31 NOTE — Progress Notes (Signed)
Progress Note  Patient Name: Ariana Riggs Date of Encounter: 03/31/2018  Primary Cardiologist:[facility], Dr. Saunders Revel  Subjective   Several liters of diuresis in the past 24 hours Still very short of breath, leg edema extending up to her buttocks, pitting Abdomen slightly less distended and tender Weight appears to be 20 pounds still above her baseline, down 10 pounds this admission    Inpatient Medications    Scheduled Meds: . ALPRAZolam  0.5 mg Oral Once  . apixaban  5 mg Oral BID  . budesonide  0.25 mg Nebulization BID  . buPROPion  150 mg Oral Daily  . citalopram  40 mg Oral Daily  . furosemide  20 mg Intravenous Once  . furosemide  40 mg Intravenous Q12H  . ipratropium-albuterol  3 mL Nebulization TID  . metoprolol succinate  25 mg Oral Daily  . nicotine  14 mg Transdermal Daily  . pantoprazole  40 mg Oral Daily  . QUEtiapine  200 mg Oral QHS  . sodium chloride flush  3 mL Intravenous Q12H  . sucralfate  1 g Oral QID   Continuous Infusions: . sodium chloride     PRN Meds: sodium chloride, acetaminophen **OR** acetaminophen, albuterol, bisacodyl, guaiFENesin-dextromethorphan, HYDROcodone-acetaminophen, metoprolol tartrate, ondansetron **OR** ondansetron (ZOFRAN) IV, senna-docusate, sodium chloride flush   Vital Signs    Vitals:   03/31/18 0000 03/31/18 0444 03/31/18 0720 03/31/18 0843  BP:  105/75 105/77   Pulse:  75 76   Resp:  18    Temp:  97.7 F (36.5 C) 97.7 F (36.5 C)   TempSrc:  Oral Oral   SpO2: 93% 91% 93% 93%  Weight:  109.9 kg    Height:        Intake/Output Summary (Last 24 hours) at 03/31/2018 1250 Last data filed at 03/31/2018 1007 Gross per 24 hour  Intake 1440 ml  Output 3000 ml  Net -1560 ml   Filed Weights   03/29/18 0325 03/30/18 0451 03/31/18 0444  Weight: 113.3 kg 109.5 kg 109.9 kg    Telemetry    Normal sinus rhythm- Personally Reviewed  ECG    No new tracings - Personally Reviewed  Physical Exam    Constitutional:  oriented to person, place, and time. No distress.  Obese HENT:  Head: Grossly normal Eyes:  no discharge. No scleral icterus.  Neck: No JVD, no carotid bruits  Cardiovascular: Regular rate and rhythm, no murmurs appreciated 2+ pitting edema extending from legs to the buttocks, sacral edema Pulmonary/Chest: Moderately decreased breath sounds throughout, dullness at the bases, scattered wheezes Abdominal: Soft.  no distension.  no tenderness.  Musculoskeletal: Normal range of motion Neurological:  normal muscle tone. Coordination normal. No atrophy Skin: Skin warm and dry Psychiatric: normal affect, pleasant    Labs    Chemistry Recent Labs  Lab 03/27/18 1724  03/29/18 0314 03/30/18 0313 03/31/18 0302  NA 135   < > 135 138 137  K 5.4*   < > 4.5 4.3 4.3  CL 105   < > 101 102 102  CO2 22   < > 21* 29 26  GLUCOSE 107*   < > 142* 112* 98  BUN 16   < > 41* 42* 34*  CREATININE 0.83   < > 1.45* 1.35* 0.99  CALCIUM 8.6*   < > 8.9 8.5* 8.6*  PROT 6.8  --   --   --   --   ALBUMIN 3.6  --   --   --   --  AST 42*  --   --   --   --   ALT 42  --   --   --   --   ALKPHOS 118  --   --   --   --   BILITOT 2.2*  --   --   --   --   GFRNONAA >60   < > 40* 43* >60  GFRAA >60   < > 46* 50* >60  ANIONGAP 8   < > 13 7 9    < > = values in this interval not displayed.     Hematology Recent Labs  Lab 03/28/18 0059 03/29/18 0314 03/30/18 0313  WBC 12.3* 13.9* 11.3*  RBC 4.90 4.85 4.59  HGB 16.0* 15.7* 15.1*  HCT 49.5* 48.1* 46.9*  MCV 101.0* 99.2 102.2*  MCH 32.7 32.4 32.9  MCHC 32.3 32.6 32.2  RDW 15.6* 15.9* 15.9*  PLT 290 279 216    Cardiac Enzymes Recent Labs  Lab 03/28/18 0059 03/28/18 0656 03/28/18 1411  TROPONINI 0.05* 0.03* 0.03*   No results for input(s): TROPIPOC in the last 168 hours.   BNP Recent Labs  Lab 03/27/18 1430  BNP 212.0*     DDimer No results for input(s): DDIMER in the last 168 hours.   Radiology    US  Paracentesis  Result Date: 03/29/2018 INDICATION: CHF with abdominal distension and ascites. Request for paracentesis. EXAM: ULTRASOUND GUIDED PARACENTESIS MEDICATIONS: None. COMPLICATIONS: None immediate. PROCEDURE: Informed written consent was obtained from the patient after a discussion of the risks, benefits and alternatives to treatment. A timeout was performed prior to the initiation of the procedure. Initial ultrasound scanning demonstrates a large amount of ascites within the left lower abdominal quadrant. The left lower abdomen was prepped and draped in the usual sterile fashion. 1% lidocaine was used for local anesthesia. Following this, a 6 Fr Safe-T-Centesis catheter was introduced. An ultrasound image was saved for documentation purposes. The paracentesis was performed. The catheter was removed and a dressing was applied. The patient tolerated the procedure well without immediate post procedural complication. FINDINGS: A total of approximately 3 L of dark yellow fluid was removed. Samples were sent to the laboratory as requested by the clinical team. IMPRESSION: Successful ultrasound-guided paracentesis yielding 3 liters of peritoneal fluid. Electronically Signed   By: Markus Daft M.D.   On: 03/29/2018 15:36    Cardiac Studies   Echocardiogram March 28, 2018  1. The left ventricle has moderate-severely reduced systolic function, with an ejection fraction of 30-35%. The cavity size was normal. Left ventricular diastolic Doppler parameters are indeterminate.  2. The right ventricle has moderately reduced systolic function. The cavity was moderately enlarged. There is no increase in right ventricular wall thickness.  3. Left atrial size was severely dilated.  4. Right atrial size was moderately dilated.  5. Mitral valve regurgitation is moderate to severe by color flow Doppler.  6. Tricuspid valve regurgitation is severe.  7. The inferior vena cava was dilated in size with <50% respiratory  variability.  8. Rhythm is normal sinus  Patient Profile     58 y.o. female with a history of hyperlipidemia, COPD, asthma, current smoker, GERD, OSA on CPAP who is being seen today for the evaluation of A. fib with RVR now in SR.  Assessment & Plan    A/P: Acute respiratory distress Multifactorial including COPD/asthma, sleep apnea, COPD exacerbation Systolic dysfunction/pulmonary edema, MR and TR, ascites from heart failure ---Echocardiogram : moderately depressed LV function  and RV function significant mitral valve regurgitation tricuspid valve regurgitation, pulmonary hypertension ---Weight appears to be 20 pounds still above her baseline as detailed in graph above --- With increased diuretic up to every 8 given improved renal function with diuresis  Elevated troponin In the setting of atrial fibrillation with RVR, cardiomyopathy, COPD exacerbation Will need to consider ischemic work-up once able to tolerate supine position. Still unable to lay flat for very long.  This could be done as outpatient if needed Consider limited echo for ejection fraction after she reaches goal weight 220 pounds  Atrial fibrillation with RVR Converted to normal sinus rhythm Heparin infusion for now in case cardiac catheterization needed this admission NOAC at the time of discharge  Acute renal failure Appears to be cardiorenal with improving renal function on diuresis We will continue to monitor, Lasix up to every 8   Total encounter time more than 25 minutes  Greater than 50% was spent in counseling and coordination of care with the patient   For questions or updates, please contact [place] Please consult [email or web address] for contact info under        Signed, [name], MD  03/31/2018, 12:50 PM

## 2018-04-01 DIAGNOSIS — I5041 Acute combined systolic (congestive) and diastolic (congestive) heart failure: Secondary | ICD-10-CM

## 2018-04-01 DIAGNOSIS — I4891 Unspecified atrial fibrillation: Secondary | ICD-10-CM

## 2018-04-01 DIAGNOSIS — J432 Centrilobular emphysema: Secondary | ICD-10-CM

## 2018-04-01 LAB — BASIC METABOLIC PANEL
ANION GAP: 6 (ref 5–15)
BUN: 26 mg/dL — ABNORMAL HIGH (ref 6–20)
CO2: 36 mmol/L — AB (ref 22–32)
Calcium: 8.5 mg/dL — ABNORMAL LOW (ref 8.9–10.3)
Chloride: 100 mmol/L (ref 98–111)
Creatinine, Ser: 1.23 mg/dL — ABNORMAL HIGH (ref 0.44–1.00)
GFR calc non Af Amer: 48 mL/min — ABNORMAL LOW (ref >60)
GFR, EST AFRICAN AMERICAN: 56 mL/min — AB (ref >60)
Glucose, Bld: 104 mg/dL — ABNORMAL HIGH (ref 70–99)
Potassium: 3.8 mmol/L (ref 3.5–5.1)
Sodium: 142 mmol/L (ref 135–145)

## 2018-04-01 LAB — LIPID PANEL
Cholesterol: 103 mg/dL (ref 0–200)
HDL: 19 mg/dL — ABNORMAL LOW (ref >40)
LDL Cholesterol: 61 mg/dL (ref 0–99)
TRIGLYCERIDES: 116 mg/dL (ref <150)
Total CHOL/HDL Ratio: 5.4 RATIO
VLDL: 23 mg/dL (ref 0–40)

## 2018-04-01 MED ORDER — AMIODARONE LOAD VIA INFUSION
150.0000 mg | Freq: Once | INTRAVENOUS | Status: AC
  Administered 2018-04-01: 150 mg via INTRAVENOUS
  Filled 2018-04-01: qty 83.34

## 2018-04-01 MED ORDER — AMIODARONE HCL IN DEXTROSE 360-4.14 MG/200ML-% IV SOLN
60.0000 mg/h | INTRAVENOUS | Status: DC
  Administered 2018-04-01 (×2): 60 mg/h via INTRAVENOUS
  Filled 2018-04-01 (×2): qty 200

## 2018-04-01 MED ORDER — CIPROFLOXACIN HCL 500 MG PO TABS
500.0000 mg | ORAL_TABLET | Freq: Two times a day (BID) | ORAL | Status: DC
  Administered 2018-04-01 – 2018-04-05 (×8): 500 mg via ORAL
  Filled 2018-04-01 (×9): qty 1

## 2018-04-01 MED ORDER — AMIODARONE HCL IN DEXTROSE 360-4.14 MG/200ML-% IV SOLN
60.0000 mg/h | INTRAVENOUS | Status: DC
  Administered 2018-04-01: 30 mg/h via INTRAVENOUS
  Administered 2018-04-02: 60 mg/h via INTRAVENOUS
  Administered 2018-04-02 (×2): 30 mg/h via INTRAVENOUS
  Administered 2018-04-03 (×3): 60 mg/h via INTRAVENOUS
  Filled 2018-04-01 (×7): qty 200

## 2018-04-01 MED ORDER — METOPROLOL TARTRATE 5 MG/5ML IV SOLN
2.5000 mg | Freq: Once | INTRAVENOUS | Status: AC
  Administered 2018-04-01: 2.5 mg via INTRAVENOUS

## 2018-04-01 MED ORDER — DILTIAZEM HCL 25 MG/5ML IV SOLN
5.0000 mg | Freq: Once | INTRAVENOUS | Status: AC
  Administered 2018-04-01: 5 mg via INTRAVENOUS
  Filled 2018-04-01: qty 5

## 2018-04-01 NOTE — Progress Notes (Signed)
Progress Note  Patient Name: Ariana Riggs Date of Encounter: 04/01/2018  Primary Cardiologist:[facility], Dr. Saunders Revel  Subjective   Converted to atrial fibrillation with RVR last night around midnight Did not improve with diltiazem IV push,  Hypotension this morning Still short of breath, or significant cough 3.8 L negative past 24 hours, 10.4 L total Weight trend as below, still 15 pounds above her baseline She reports legs are still swollen    Inpatient Medications    Scheduled Meds: . ALPRAZolam  0.5 mg Oral Once  . amiodarone  150 mg Intravenous Once  . apixaban  5 mg Oral BID  . budesonide  0.25 mg Nebulization BID  . buPROPion  150 mg Oral Daily  . citalopram  40 mg Oral Daily  . furosemide  40 mg Intravenous Q12H  . ipratropium-albuterol  3 mL Nebulization TID  . metoprolol succinate  25 mg Oral Daily  . nicotine  14 mg Transdermal Daily  . pantoprazole  40 mg Oral Daily  . QUEtiapine  200 mg Oral QHS  . sodium chloride flush  3 mL Intravenous Q12H  . sucralfate  1 g Oral QID   Continuous Infusions: . sodium chloride    . amiodarone     Followed by  . amiodarone     PRN Meds: sodium chloride, acetaminophen **OR** acetaminophen, albuterol, bisacodyl, guaiFENesin-dextromethorphan, HYDROcodone-acetaminophen, metoprolol tartrate, ondansetron **OR** ondansetron (ZOFRAN) IV, senna-docusate, sodium chloride flush   Vital Signs    Vitals:   03/31/18 2332 04/01/18 0205 04/01/18 0558 04/01/18 0759  BP:  105/69 102/84 91/71  Pulse: 71 (!) 129 (!) 48   Resp: 18  20 (!) 21  Temp:   98 F (36.7 C) 98.6 F (37 C)  TempSrc:   Oral Oral  SpO2: 94% 92% 92% 95%  Weight:   105.5 kg   Height:        Intake/Output Summary (Last 24 hours) at 04/01/2018 1004 Last data filed at 04/01/2018 0951 Gross per 24 hour  Intake 960 ml  Output 4150 ml  Net -3190 ml   Filed Weights   03/30/18 0451 03/31/18 0444 04/01/18 0558  Weight: 109.5 kg 109.9 kg 105.5 kg     Telemetry    Atrial fibrillation with RVR- Personally Reviewed  ECG    No new tracings - Personally Reviewed  Physical Exam   Constitutional:  oriented to person, place, and time. No distress.  Obese, cough HENT:  Head: Grossly normal Eyes:  no discharge. No scleral icterus.  Neck: No JVD, no carotid bruits  Cardiovascular: Irregularly irregular no murmurs appreciated 2+ pitting edema extending from legs to the buttocks, sacral edema Pulmonary/Chest: Moderately decreased breath sounds throughout, dullness at the bases, scattered wheezes Abdominal: Soft.  no distension.  no tenderness.  Musculoskeletal: Normal range of motion Neurological:  normal muscle tone. Coordination normal. No atrophy Skin: Skin warm and dry Psychiatric: normal affect, pleasant   Labs    Chemistry Recent Labs  Lab 03/27/18 1724  03/30/18 0313 03/31/18 0302 04/01/18 0636  NA 135   < > 138 137 142  K 5.4*   < > 4.3 4.3 3.8  CL 105   < > 102 102 100  CO2 22   < > 29 26 36*  GLUCOSE 107*   < > 112* 98 104*  BUN 16   < > 42* 34* 26*  CREATININE 0.83   < > 1.35* 0.99 1.23*  CALCIUM 8.6*   < > 8.5* 8.6* 8.5*  PROT 6.8  --   --   --   --   ALBUMIN 3.6  --   --   --   --   AST 42*  --   --   --   --   ALT 42  --   --   --   --   ALKPHOS 118  --   --   --   --   BILITOT 2.2*  --   --   --   --   GFRNONAA >60   < > 43* >60 48*  GFRAA >60   < > 50* >60 56*  ANIONGAP 8   < > 7 9 6    < > = values in this interval not displayed.     Hematology Recent Labs  Lab 03/28/18 0059 03/29/18 0314 03/30/18 0313  WBC 12.3* 13.9* 11.3*  RBC 4.90 4.85 4.59  HGB 16.0* 15.7* 15.1*  HCT 49.5* 48.1* 46.9*  MCV 101.0* 99.2 102.2*  MCH 32.7 32.4 32.9  MCHC 32.3 32.6 32.2  RDW 15.6* 15.9* 15.9*  PLT 290 279 216    Cardiac Enzymes Recent Labs  Lab 03/28/18 0059 03/28/18 0656 03/28/18 1411  TROPONINI 0.05* 0.03* 0.03*   No results for input(s): TROPIPOC in the last 168 hours.   BNP Recent Labs   Lab 03/27/18 1430  BNP 212.0*     DDimer No results for input(s): DDIMER in the last 168 hours.   Radiology    No results found.  Cardiac Studies   Echocardiogram March 28, 2018  1. The left ventricle has moderate-severely reduced systolic function, with an ejection fraction of 30-35%. The cavity size was normal. Left ventricular diastolic Doppler parameters are indeterminate.  2. The right ventricle has moderately reduced systolic function. The cavity was moderately enlarged. There is no increase in right ventricular wall thickness.  3. Left atrial size was severely dilated.  4. Right atrial size was moderately dilated.  5. Mitral valve regurgitation is moderate to severe by color flow Doppler.  6. Tricuspid valve regurgitation is severe.  7. The inferior vena cava was dilated in size with <50% respiratory variability.  8. Rhythm is normal sinus  Patient Profile     58 y.o. female with a history of hyperlipidemia, COPD, asthma, current smoker, GERD, OSA on CPAP who is being seen today for the evaluation of A. fib with RVR now in SR.  Assessment & Plan    A/P: Acute respiratory distress Multifactorial including COPD/asthma, sleep apnea, COPD exacerbation Systolic dysfunction/pulmonary edema, MR and TR, ascites from heart failure ---Echocardiogram : moderately depressed LV function and RV function significant mitral valve regurgitation tricuspid valve regurgitation, pulmonary hypertension --- Worsening symptoms this morning in the setting of new onset atrial fibrillation starting midnight last night --- We will work on rhythm control,  continue diuresis given still 15 pounds up above her baseline  Elevated troponin Suspect underlying coronary disease in the setting of lifetime of smoking Elevated troponin In the setting of atrial fibrillation with RVR, cardiomyopathy, COPD exacerbation Will need to consider ischemic work-up once able to tolerate supine  position.  Atrial fibrillation with RVR Atrial fibrillation with RVR on arrival, converted to normal sinus rhythm with diltiazem Back into atrial fibrillation last night rate 130 up to 140 Little room for diltiazem given hypotension and rapid rate. Suspect atrial fibrillation may be playing a role in her fluid retention at home We will start amiodarone infusion given few other choices  Acute renal  failure Appears to be cardiorenal with improving renal function on diuresis We will continue to monitor, Lasix up to every 8  Long discussion with her concerning atrial fibrillation, management, medications that can be used Discussed with nursing, hospitalist service  Total encounter time more than 35 minutes  Greater than 50% was spent in counseling and coordination of care with the patient   For questions or updates, please contact [place] Please consult [email or web address] for contact info under        Signed, [name], MD  04/01/2018, 10:04 AM

## 2018-04-01 NOTE — Progress Notes (Signed)
Patient Heart rate went up to the 140's -150's sub staining. EKG ordered, 5 mg of Metoprolol given,  on call dr. Curly Rim, will continue to monitor.

## 2018-04-01 NOTE — Progress Notes (Addendum)
Patient ID: Ariana Riggs, female   DOB: July 31, 1960, 58 y.o.   MRN: [id number]  [employer] PROGRESS NOTE  Ariana Riggs MWN:[id number] DOB: 08-Nov-1960 DOA: 03/27/2018 PCP: Ezequiel Kayser, MD  HPI/Subjective: Patient went into rapid atrial fibrillation overnight.  Called in this morning about fast heart rate.  Patient does feel some palpitations but otherwise feeling better than when she came in.  No chest pain or shortness of breath.  Objective: Vitals:   04/01/18 1048 04/01/18 1257  BP: 112/67 113/70  Pulse: (!) 124 (!) 119  Resp:    Temp:    SpO2:      Filed Weights   03/30/18 0451 03/31/18 0444 04/01/18 0558  Weight: 109.5 kg 109.9 kg 105.5 kg    ROS: Review of Systems  Constitutional: Negative for chills and fever.  Eyes: Negative for blurred vision.  Respiratory: Negative for cough and shortness of breath.   Cardiovascular: Positive for palpitations. Negative for chest pain.  Gastrointestinal: Negative for abdominal pain, constipation, diarrhea, nausea and vomiting.  Genitourinary: Negative for dysuria.  Musculoskeletal: Negative for joint pain.  Neurological: Negative for dizziness and headaches.   Exam: Physical Exam  Constitutional: She is oriented to person, place, and time.  HENT:  Nose: No mucosal edema.  Mouth/Throat: No oropharyngeal exudate or posterior oropharyngeal edema.  Eyes: Pupils are equal, round, and reactive to light. Conjunctivae, EOM and lids are normal.  Neck: No JVD present. Carotid bruit is not present. No edema present. No thyroid mass and no thyromegaly present.  Cardiovascular: S1 normal and S2 normal. An irregularly irregular rhythm present. Tachycardia present. Exam reveals no gallop.  No murmur heard. Pulses:      Dorsalis pedis pulses are 2+ on the right side and 2+ on the left side.  Respiratory: No respiratory distress. She has decreased breath sounds in the right lower field and the left lower field. She has no wheezes.  She has no rhonchi. She has rales in the right lower field and the left lower field.  GI: Soft. Bowel sounds are normal. She exhibits distension. There is no abdominal tenderness.  Musculoskeletal:     Right ankle: She exhibits swelling.     Left ankle: She exhibits swelling.  Lymphadenopathy:    She has no cervical adenopathy.  Neurological: She is alert and oriented to person, place, and time. No cranial nerve deficit.  Skin: Skin is warm. No rash noted. Nails show no clubbing.  Psychiatric: She has a normal mood and affect.      Data Reviewed: Basic Metabolic Panel: Recent Labs  Lab 03/27/18 1724 03/28/18 0059 03/29/18 0314 03/30/18 0313 03/31/18 0302 04/01/18 0636  NA 135 135 135 138 137 142  K 5.4* 4.7 4.5 4.3 4.3 3.8  CL 105 104 101 102 102 100  CO2 22 20* 21* 29 26 36*  GLUCOSE 107* 140* 142* 112* 98 104*  BUN 16 20 41* 42* 34* 26*  CREATININE 0.83 1.33* 1.45* 1.35* 0.99 1.23*  CALCIUM 8.6* 8.6* 8.9 8.5* 8.6* 8.5*  MG 2.4 2.6*  --   --   --   --    Liver Function Tests: Recent Labs  Lab 03/27/18 1724  AST 42*  ALT 42  ALKPHOS 118  BILITOT 2.2*  PROT 6.8  ALBUMIN 3.6   CBC: Recent Labs  Lab 03/27/18 1724 03/28/18 0059 03/29/18 0314 03/30/18 0313  WBC 10.0 12.3* 13.9* 11.3*  NEUTROABS 6.9  --   --   --   HGB 15.0  16.0* 15.7* 15.1*  HCT 46.2* 49.5* 48.1* 46.9*  MCV 100.2* 101.0* 99.2 102.2*  PLT 236 290 279 216   Cardiac Enzymes: Recent Labs  Lab 03/28/18 0059 03/28/18 0656 03/28/18 1411  TROPONINI 0.05* 0.03* 0.03*   BNP (last 3 results) Recent Labs    03/27/18 1430  BNP 212.0*     CBG: Recent Labs  Lab 03/28/18 0021 03/28/18 2110  GLUCAP 152* 131*    Recent Results (from the past 240 hour(s))  MRSA PCR Screening     Status: None   Collection Time: 03/28/18  2:10 AM  Result Value Ref Range Status   MRSA by PCR NEGATIVE NEGATIVE Final    Comment:        The GeneXpert MRSA Assay (FDA approved for NASAL specimens only), is  one component of a comprehensive MRSA colonization surveillance program. It is not intended to diagnose MRSA infection nor to guide or monitor treatment for MRSA infections. Performed at [facility], [address] [place], [place] [address]   CULTURE, BLOOD (ROUTINE X 2) w Reflex to ID Panel     Status: None (Preliminary result)   Collection Time: 03/28/18  2:53 AM  Result Value Ref Range Status   Specimen Description BLOOD RIGHT [facility]  Final   Special Requests   Final    BOTTLES DRAWN AEROBIC AND ANAEROBIC Blood Culture adequate volume   Culture   Final    NO GROWTH 4 DAYS Performed at [facility], [address] [place], [place] [address]    Report Status PENDING  Incomplete  Urine Culture     Status: None   Collection Time: 03/28/18  3:04 AM  Result Value Ref Range Status   Specimen Description   Final    URINE, RANDOM Performed at [facility], [address] [place], [place] [address]    Special Requests   Final    NONE Performed at [facility], [address] [place], [place] [address]    Culture   Final    NO GROWTH Performed at [facility], [place] [address] [place], [place] [address]    Report Status 03/29/2018 FINAL  Final  CULTURE, BLOOD (ROUTINE X 2) w Reflex to ID Panel     Status: None (Preliminary result)   Collection Time: 03/28/18  3:11 AM  Result Value Ref Range Status   Specimen Description BLOOD RIGHT [facility]  Final   Special Requests   Final    BOTTLES DRAWN AEROBIC AND ANAEROBIC Blood Culture adequate volume   Culture   Final    NO GROWTH 4 DAYS Performed at [facility], [address] [place], [place] [address]    Report Status PENDING  Incomplete      Scheduled Meds: . ALPRAZolam  0.5 mg Oral Once  . apixaban  5 mg Oral BID  . budesonide  0.25 mg Nebulization BID  . buPROPion  150 mg Oral Daily  . citalopram  40 mg Oral Daily  . furosemide  40 mg Intravenous Q12H  .  ipratropium-albuterol  3 mL Nebulization TID  . metoprolol succinate  25 mg Oral Daily  . nicotine  14 mg Transdermal Daily  . pantoprazole  40 mg Oral Daily  . QUEtiapine  200 mg Oral QHS  . sodium chloride flush  3 mL Intravenous Q12H  . sucralfate  1 g Oral QID   Continuous Infusions: . sodium chloride    . amiodarone 60 mg/hr (04/01/18 1042)  Followed by  . amiodarone      Assessment/Plan:  1. Atrial fibrillation with rapid ventricular response.  Case discussed with cardiology and patient will be started on amiodarone drip.  Patient on low-dose metoprolol.  Eliquis for anticoagulation. 2. Relative hypotension.  Limited with other medications secondary to hypotension.  Continue low-dose metoprolol. 3. Acute on chronic systolic congestive heart failure.  EF 30 to 35%.  Limited with other medications secondary to hypotension.  Low-dose metoprolol and IV Lasix. 4. Abdominal distention and ascites.  3 L of fluid taken off on 03/29/2018.  Does not look like a culture was sent off but there was greater than 250 white blood cells.  Empiric Cipro ordered 5. Ventricular tachycardia the other night.  Amiodarone started today. 6. Obesity with a BMI of 37.53 7. Anxiety on Xanax 8. Bipolar disorder continue psychiatric medications  Code Status:     Code Status Orders  (From admission, onward)         Start     Ordered   03/27/18 1714  Full code  Continuous     03/27/18 1713        Code Status History    This patient has a current code status but no historical code status.     Disposition Plan: To be determined  Consultants:  Cardiology  Antibiotics: -Cipro  Time spent: 28 minutes.  Case discussed with cardiology.  Ariana Riggs [employer]

## 2018-04-02 DIAGNOSIS — J418 Mixed simple and mucopurulent chronic bronchitis: Secondary | ICD-10-CM

## 2018-04-02 DIAGNOSIS — M1A9XX0 Chronic gout, unspecified, without tophus (tophi): Secondary | ICD-10-CM | POA: Insufficient documentation

## 2018-04-02 LAB — BASIC METABOLIC PANEL
ANION GAP: 8 (ref 5–15)
BUN: 20 mg/dL (ref 6–20)
CO2: 32 mmol/L (ref 22–32)
Calcium: 8 mg/dL — ABNORMAL LOW (ref 8.9–10.3)
Chloride: 99 mmol/L (ref 98–111)
Creatinine, Ser: 0.99 mg/dL (ref 0.44–1.00)
GFR calc Af Amer: >60 mL/min (ref >60)
GFR calc non Af Amer: >60 mL/min (ref >60)
Glucose, Bld: 109 mg/dL — ABNORMAL HIGH (ref 70–99)
Potassium: 2.9 mmol/L — ABNORMAL LOW (ref 3.5–5.1)
Sodium: 139 mmol/L (ref 135–145)

## 2018-04-02 LAB — CULTURE, BLOOD (ROUTINE X 2)
CULTURE: NO GROWTH
Culture: NO GROWTH
SPECIAL REQUESTS: ADEQUATE
Special Requests: ADEQUATE

## 2018-04-02 LAB — MAGNESIUM: Magnesium: 1.9 mg/dL (ref 1.7–2.4)

## 2018-04-02 LAB — URIC ACID: Uric Acid, Serum: 12.8 mg/dL — ABNORMAL HIGH (ref 2.5–7.1)

## 2018-04-02 MED ORDER — DIGOXIN 0.25 MG/ML IJ SOLN
0.2500 mg | Freq: Once | INTRAMUSCULAR | Status: AC
  Administered 2018-04-02: 0.25 mg via INTRAVENOUS
  Filled 2018-04-02: qty 2

## 2018-04-02 MED ORDER — PREDNISONE 20 MG PO TABS
30.0000 mg | ORAL_TABLET | Freq: Every day | ORAL | Status: DC
  Administered 2018-04-03: 30 mg via ORAL
  Filled 2018-04-02: qty 1

## 2018-04-02 MED ORDER — METHYLPREDNISOLONE SODIUM SUCC 40 MG IJ SOLR
40.0000 mg | Freq: Once | INTRAMUSCULAR | Status: AC
  Administered 2018-04-02: 40 mg via INTRAVENOUS
  Filled 2018-04-02: qty 1

## 2018-04-02 MED ORDER — IPRATROPIUM-ALBUTEROL 0.5-2.5 (3) MG/3ML IN SOLN
3.0000 mL | Freq: Two times a day (BID) | RESPIRATORY_TRACT | Status: DC
  Administered 2018-04-02 – 2018-04-04 (×4): 3 mL via RESPIRATORY_TRACT
  Filled 2018-04-02 (×4): qty 3

## 2018-04-02 MED ORDER — POTASSIUM CHLORIDE CRYS ER 20 MEQ PO TBCR
40.0000 meq | EXTENDED_RELEASE_TABLET | Freq: Three times a day (TID) | ORAL | Status: DC
  Administered 2018-04-02 – 2018-04-03 (×4): 40 meq via ORAL
  Filled 2018-04-02 (×4): qty 2

## 2018-04-02 MED ORDER — MAGNESIUM SULFATE 2 GM/50ML IV SOLN
2.0000 g | Freq: Once | INTRAVENOUS | Status: AC
  Administered 2018-04-02: 2 g via INTRAVENOUS
  Filled 2018-04-02: qty 50

## 2018-04-02 MED ORDER — COLCHICINE 0.6 MG PO TABS
0.6000 mg | ORAL_TABLET | Freq: Every day | ORAL | Status: DC
  Administered 2018-04-02 – 2018-04-05 (×4): 0.6 mg via ORAL
  Filled 2018-04-02 (×4): qty 1

## 2018-04-02 NOTE — Progress Notes (Addendum)
Progress Note  Patient Name: Ariana Riggs Date of Encounter: 04/02/2018  Primary Cardiologist:[facility], Dr. Saunders Revel  Subjective   Atrial fibrillation started midnight February 15 Did not improve with diltiazem IV push,  Hypotension yesterday, started on amiodarone infusion  3.3 L negative in the past 24 hours, 13.5 L negative total Still with significant leg swelling, sacral edema, thigh edema, abdominal distention from ascites  Significant thick, purulent bronchospastic cough  Inpatient Medications    Scheduled Meds: . ALPRAZolam  0.5 mg Oral Once  . apixaban  5 mg Oral BID  . budesonide  0.25 mg Nebulization BID  . buPROPion  150 mg Oral Daily  . ciprofloxacin  500 mg Oral BID  . citalopram  40 mg Oral Daily  . colchicine  0.6 mg Oral Daily  . furosemide  40 mg Intravenous Q12H  . ipratropium-albuterol  3 mL Nebulization BID  . methylPREDNISolone (SOLU-MEDROL) injection  40 mg Intravenous Once  . metoprolol succinate  25 mg Oral Daily  . nicotine  14 mg Transdermal Daily  . pantoprazole  40 mg Oral Daily  . potassium chloride  40 mEq Oral TID  . [START ON 04/03/2018] predniSONE  30 mg Oral Q breakfast  . QUEtiapine  200 mg Oral QHS  . sodium chloride flush  3 mL Intravenous Q12H  . sucralfate  1 g Oral QID   Continuous Infusions: . sodium chloride    . amiodarone 60 mg/hr (04/02/18 1351)   PRN Meds: sodium chloride, acetaminophen **OR** acetaminophen, albuterol, bisacodyl, guaiFENesin-dextromethorphan, HYDROcodone-acetaminophen, metoprolol tartrate, ondansetron **OR** ondansetron (ZOFRAN) IV, senna-docusate, sodium chloride flush   Vital Signs    Vitals:   04/01/18 1935 04/01/18 2146 04/02/18 0429 04/02/18 0954  BP: 113/81  102/79 96/66  Pulse: (!) 107 (!) 111 (!) 119 (!) 122  Resp: 18 18  20   Temp: 98.6 F (37 C)  98.4 F (36.9 C) 98.4 F (36.9 C)  TempSrc: Oral  Oral Oral  SpO2: 95% 97% 94% 93%  Weight:   104.3 kg   Height:        Intake/Output  Summary (Last 24 hours) at 04/02/2018 1535 Last data filed at 04/02/2018 1339 Gross per 24 hour  Intake 480 ml  Output 2750 ml  Net -2270 ml   Filed Weights   03/31/18 0444 04/01/18 0558 04/02/18 0429  Weight: 109.9 kg 105.5 kg 104.3 kg    Telemetry    Atrial fibrillation with RVR- Personally Reviewed  ECG    No new tracings - Personally Reviewed  Physical Exam   Constitutional:  oriented to person, place, and time. No distress.  HENT:  Head: Normocephalic and atraumatic.  Eyes:  no discharge. No scleral icterus.  Neck: Normal range of motion. Neck supple. No JVD present.  Cardiovascular: Irregularly irregular, rapid exam reveals no gallop and no friction rub.  No murmur heard. Abdominal bloating, trace leg edema Pulmonary/Chest: Effort normal and breath sounds normal. No stridor. No respiratory distress.  no wheezes.  no rales.  no tenderness.  Abdominal: Soft.  no distension.  no tenderness.  Musculoskeletal: Normal range of motion.  no  tenderness or deformity.  Neurological:  normal muscle tone. Coordination normal. No atrophy Skin: Skin is warm and dry. No rash noted. not diaphoretic.  Psychiatric:  normal mood and affect. behavior is normal. Thought content normal.   Labs    Chemistry Recent Labs  Lab 03/27/18 1724  03/31/18 0302 04/01/18 0636 04/02/18 0440  NA 135   < >  137 142 139  K 5.4*   < > 4.3 3.8 2.9*  CL 105   < > 102 100 99  CO2 22   < > 26 36* 32  GLUCOSE 107*   < > 98 104* 109*  BUN 16   < > 34* 26* 20  CREATININE 0.83   < > 0.99 1.23* 0.99  CALCIUM 8.6*   < > 8.6* 8.5* 8.0*  PROT 6.8  --   --   --   --   ALBUMIN 3.6  --   --   --   --   AST 42*  --   --   --   --   ALT 42  --   --   --   --   ALKPHOS 118  --   --   --   --   BILITOT 2.2*  --   --   --   --   GFRNONAA >60   < > >60 48* >60  GFRAA >60   < > >60 56* >60  ANIONGAP 8   < > 9 6 8    < > = values in this interval not displayed.     Hematology Recent Labs  Lab 03/28/18 0059  03/29/18 0314 03/30/18 0313  WBC 12.3* 13.9* 11.3*  RBC 4.90 4.85 4.59  HGB 16.0* 15.7* 15.1*  HCT 49.5* 48.1* 46.9*  MCV 101.0* 99.2 102.2*  MCH 32.7 32.4 32.9  MCHC 32.3 32.6 32.2  RDW 15.6* 15.9* 15.9*  PLT 290 279 216    Cardiac Enzymes Recent Labs  Lab 03/28/18 0059 03/28/18 0656 03/28/18 1411  TROPONINI 0.05* 0.03* 0.03*   No results for input(s): TROPIPOC in the last 168 hours.   BNP Recent Labs  Lab 03/27/18 1430  BNP 212.0*     DDimer No results for input(s): DDIMER in the last 168 hours.   Radiology    No results found.  Cardiac Studies   Echocardiogram March 28, 2018  1. The left ventricle has moderate-severely reduced systolic function, with an ejection fraction of 30-35%. The cavity size was normal. Left ventricular diastolic Doppler parameters are indeterminate.  2. The right ventricle has moderately reduced systolic function. The cavity was moderately enlarged. There is no increase in right ventricular wall thickness.  3. Left atrial size was severely dilated.  4. Right atrial size was moderately dilated.  5. Mitral valve regurgitation is moderate to severe by color flow Doppler.  6. Tricuspid valve regurgitation is severe.  7. The inferior vena cava was dilated in size with <50% respiratory variability.  8. Rhythm is normal sinus  Patient Profile     58 y.o. female with a history of hyperlipidemia, COPD, asthma, current smoker, GERD, OSA on CPAP who is being seen today for the evaluation of A. fib with RVR , respiratory distress, cardiomyopathy ejection fraction 35%  Assessment & Plan    A/P: Acute respiratory distress Multifactorial including COPD/asthma, sleep apnea, COPD exacerbation Systolic dysfunction/pulmonary edema, MR and TR, ascites from heart failure ---Echocardiogram : moderately depressed LV function and RV function significant mitral valve regurgitation tricuspid valve regurgitation, pulmonary hypertension ejection fraction  35% -Slowly improving, 13.5 L net negative Would continue aggressive diuresis, work on restoring normal sinus rhythm, underlying bronchitis  Elevated troponin Suspect underlying coronary disease in the setting of lifetime of smoking Elevated troponin In the setting of atrial fibrillation with RVR, cardiomyopathy, COPD exacerbation Will need to consider ischemic work-up once able to tolerate supine position. -  Still unable to lay flat, need to work on better atrial fibrillation rate and rhythm control  Atrial fibrillation with RVR Atrial fibrillation with RVR on arrival, converted to normal sinus rhythm with diltiazem Back into atrial fibrillation 2 nights ago with continued rate 130 up to 140 Little room for diltiazem given hypotension and rapid rate. Started on amiodarone infusion 24 hours still with rapid rate though relatively asymptomatic -We will increase amiodarone infusion up to 60 mg/h -Dose of digoxin --- If no improvement in rate or rhythm may need to arrange cardioversion after further diuresis  Acute renal failure Appears to be cardiorenal with improving renal function on diuresis We will continue to monitor, Lasix up to every 8 -Labs today again confirming renal function continues to improve     Total encounter time more than 25 minutes  Greater than 50% was spent in counseling and coordination of care with the patient   For questions or updates, please contact [place] Please consult [email or web address] for contact info under        Signed, [name], MD  04/02/2018, 3:35 PM

## 2018-04-02 NOTE — Progress Notes (Signed)
Patient ID: Ariana Riggs, female   DOB: 12/16/1960, 58 y.o.   MRN: [id number]  [employer] PROGRESS NOTE  Ariana Riggs WUJ:[id number] DOB: 1961/01/29 DOA: 03/27/2018 PCP: Ezequiel Kayser, MD  HPI/Subjective: Patient feeling okay.  Heart rate still elevated today.  Some palpitation.  No shortness of breath or cough.  Complaining of left toe pain.  She has a history of gout.  Objective: Vitals:   04/02/18 0429 04/02/18 0954  BP: 102/79 96/66  Pulse: (!) 119 (!) 122  Resp:  20  Temp: 98.4 F (36.9 C) 98.4 F (36.9 C)  SpO2: 94% 93%    Filed Weights   03/31/18 0444 04/01/18 0558 04/02/18 0429  Weight: 109.9 kg 105.5 kg 104.3 kg    ROS: Review of Systems  Constitutional: Negative for chills and fever.  Eyes: Negative for blurred vision.  Respiratory: Negative for cough and shortness of breath.   Cardiovascular: Positive for palpitations. Negative for chest pain.  Gastrointestinal: Negative for abdominal pain, constipation, diarrhea, nausea and vomiting.  Genitourinary: Negative for dysuria.  Musculoskeletal: Negative for joint pain.  Neurological: Negative for dizziness and headaches.   Exam: Physical Exam  Constitutional: She is oriented to person, place, and time.  HENT:  Nose: No mucosal edema.  Mouth/Throat: No oropharyngeal exudate or posterior oropharyngeal edema.  Eyes: Pupils are equal, round, and reactive to light. Conjunctivae, EOM and lids are normal.  Neck: No JVD present. Carotid bruit is not present. No edema present. No thyroid mass and no thyromegaly present.  Cardiovascular: S1 normal and S2 normal. An irregularly irregular rhythm present. Tachycardia present. Exam reveals no gallop.  No murmur heard. Pulses:      Dorsalis pedis pulses are 2+ on the right side and 2+ on the left side.  Respiratory: No respiratory distress. She has decreased breath sounds in the right lower field and the left lower field. She has no wheezes. She has no rhonchi.  She has rales in the right lower field and the left lower field.  GI: Soft. Bowel sounds are normal. She exhibits distension. There is no abdominal tenderness.  Musculoskeletal:     Right ankle: She exhibits swelling.     Left ankle: She exhibits swelling.  Lymphadenopathy:    She has no cervical adenopathy.  Neurological: She is alert and oriented to person, place, and time. No cranial nerve deficit.  Skin: Skin is warm. No rash noted. Nails show no clubbing.  Psychiatric: She has a normal mood and affect.      Data Reviewed: Basic Metabolic Panel: Recent Labs  Lab 03/27/18 1724 03/28/18 0059 03/29/18 0314 03/30/18 0313 03/31/18 0302 04/01/18 0636 04/02/18 0440  NA 135 135 135 138 137 142 139  K 5.4* 4.7 4.5 4.3 4.3 3.8 2.9*  CL 105 104 101 102 102 100 99  CO2 22 20* 21* 29 26 36* 32  GLUCOSE 107* 140* 142* 112* 98 104* 109*  BUN 16 20 41* 42* 34* 26* 20  CREATININE 0.83 1.33* 1.45* 1.35* 0.99 1.23* 0.99  CALCIUM 8.6* 8.6* 8.9 8.5* 8.6* 8.5* 8.0*  MG 2.4 2.6*  --   --   --   --  1.9   Liver Function Tests: Recent Labs  Lab 03/27/18 1724  AST 42*  ALT 42  ALKPHOS 118  BILITOT 2.2*  PROT 6.8  ALBUMIN 3.6   CBC: Recent Labs  Lab 03/27/18 1724 03/28/18 0059 03/29/18 0314 03/30/18 0313  WBC 10.0 12.3* 13.9* 11.3*  NEUTROABS 6.9  --   --   --  HGB 15.0 16.0* 15.7* 15.1*  HCT 46.2* 49.5* 48.1* 46.9*  MCV 100.2* 101.0* 99.2 102.2*  PLT 236 290 279 216   Cardiac Enzymes: Recent Labs  Lab 03/28/18 0059 03/28/18 0656 03/28/18 1411  TROPONINI 0.05* 0.03* 0.03*   BNP (last 3 results) Recent Labs    03/27/18 1430  BNP 212.0*     CBG: Recent Labs  Lab 03/28/18 0021 03/28/18 2110  GLUCAP 152* 131*    Recent Results (from the past 240 hour(s))  MRSA PCR Screening     Status: None   Collection Time: 03/28/18  2:10 AM  Result Value Ref Range Status   MRSA by PCR NEGATIVE NEGATIVE Final    Comment:        The GeneXpert MRSA Assay (FDA approved  for NASAL specimens only), is one component of a comprehensive MRSA colonization surveillance program. It is not intended to diagnose MRSA infection nor to guide or monitor treatment for MRSA infections. Performed at [facility], [address] [place], [place] [address]   CULTURE, BLOOD (ROUTINE X 2) w Reflex to ID Panel     Status: None   Collection Time: 03/28/18  2:53 AM  Result Value Ref Range Status   Specimen Description BLOOD RIGHT [facility]  Final   Special Requests   Final    BOTTLES DRAWN AEROBIC AND ANAEROBIC Blood Culture adequate volume   Culture   Final    NO GROWTH 5 DAYS Performed at [facility], [address] [place], [place] [address]    Report Status 04/02/2018 FINAL  Final  Urine Culture     Status: None   Collection Time: 03/28/18  3:04 AM  Result Value Ref Range Status   Specimen Description   Final    URINE, RANDOM Performed at [facility], [address] [place], [place] [address]    Special Requests   Final    NONE Performed at [facility], [address] [place], [place] [address]    Culture   Final    NO GROWTH Performed at [facility], [place] [address] [place], [place] [address]    Report Status 03/29/2018 FINAL  Final  CULTURE, BLOOD (ROUTINE X 2) w Reflex to ID Panel     Status: None   Collection Time: 03/28/18  3:11 AM  Result Value Ref Range Status   Specimen Description BLOOD RIGHT [facility]  Final   Special Requests   Final    BOTTLES DRAWN AEROBIC AND ANAEROBIC Blood Culture adequate volume   Culture   Final    NO GROWTH 5 DAYS Performed at [facility], [address], [place], [place] [address]    Report Status 04/02/2018 FINAL  Final      Scheduled Meds: . ALPRAZolam  0.5 mg Oral Once  . apixaban  5 mg Oral BID  . budesonide  0.25 mg Nebulization BID  . buPROPion  150 mg Oral Daily  . ciprofloxacin  500 mg Oral BID  . citalopram  40 mg Oral Daily  . furosemide  40 mg  Intravenous Q12H  . ipratropium-albuterol  3 mL Nebulization BID  . methylPREDNISolone (SOLU-MEDROL) injection  40 mg Intravenous Once  . metoprolol succinate  25 mg Oral Daily  . nicotine  14 mg Transdermal Daily  . pantoprazole  40 mg Oral Daily  . potassium chloride  40 mEq Oral TID  . [START ON 04/03/2018] predniSONE  30 mg Oral Q breakfast  . QUEtiapine  200 mg Oral QHS  . sodium chloride flush  3 mL Intravenous Q12H  . sucralfate  1 g Oral QID   Continuous Infusions: . sodium chloride    . amiodarone 60 mg/hr (04/02/18 1351)    Assessment/Plan:  1. Atrial fibrillation with rapid ventricular response.  Case discussed with cardiology.  Patient still on amiodarone drip. Heart rate still elevated.  Patient on low-dose metoprolol.  Eliquis for anticoagulation. 2. Relative hypotension.  Limited with other medications secondary to hypotension.  Continue low-dose metoprolol. 3. Acute on chronic systolic congestive heart failure.  EF 30 to 35%.  Limited with other medications secondary to hypotension.  Low-dose metoprolol and IV Lasix. 4. Hypokalemia replace potassium orally 5. Acute gout left first toe.  Start Solu-Medrol and continue prednisone tomorrow.  Also give 1 dose of colchicine today. 6. Abdominal distention and ascites.  3 L of fluid taken off on 03/29/2018.  Does not look like a culture was sent off but there was greater than 250 white blood cells.  Empiric Cipro ordered 7. Ventricular tachycardia the other night.  On amiodarone. 8. Obesity with a BMI of 37.53 9. Anxiety on Xanax 10. Bipolar disorder continue psychiatric medications  Code Status:     Code Status Orders  (From admission, onward)         Start     Ordered   03/27/18 1714  Full code  Continuous     03/27/18 1713        Code Status History    This patient has a current code status but no historical code status.     Disposition Plan: To be  determined  Consultants:  Cardiology  Antibiotics: -Cipro  Time spent: 27 minutes.  Case discussed with cardiology and nursing staff.  Case discussed with husband at the bedside.  Ariana Riggs [employer]

## 2018-04-03 LAB — BASIC METABOLIC PANEL
Anion gap: 11 (ref 5–15)
BUN: 23 mg/dL — ABNORMAL HIGH (ref 6–20)
CO2: 28 mmol/L (ref 22–32)
Calcium: 8.5 mg/dL — ABNORMAL LOW (ref 8.9–10.3)
Chloride: 98 mmol/L (ref 98–111)
Creatinine, Ser: 1.15 mg/dL — ABNORMAL HIGH (ref 0.44–1.00)
GFR calc Af Amer: >60 mL/min (ref >60)
GFR calc non Af Amer: 52 mL/min — ABNORMAL LOW (ref >60)
Glucose, Bld: 146 mg/dL — ABNORMAL HIGH (ref 70–99)
Potassium: 3.5 mmol/L (ref 3.5–5.1)
Sodium: 137 mmol/L (ref 135–145)

## 2018-04-03 LAB — CBC
HCT: 47.3 % — ABNORMAL HIGH (ref 36.0–46.0)
Hemoglobin: 15.6 g/dL — ABNORMAL HIGH (ref 12.0–15.0)
MCH: 31.8 pg (ref 26.0–34.0)
MCHC: 33 g/dL (ref 30.0–36.0)
MCV: 96.3 fL (ref 80.0–100.0)
Platelets: 173 10*3/uL (ref 150–400)
RBC: 4.91 MIL/uL (ref 3.87–5.11)
RDW: 15.5 % (ref 11.5–15.5)
WBC: 5.8 10*3/uL (ref 4.0–10.5)
nRBC: 0 % (ref 0.0–0.2)

## 2018-04-03 LAB — MAGNESIUM: Magnesium: 2.2 mg/dL (ref 1.7–2.4)

## 2018-04-03 LAB — BRAIN NATRIURETIC PEPTIDE: B Natriuretic Peptide: 701 pg/mL — ABNORMAL HIGH (ref 0.0–100.0)

## 2018-04-03 MED ORDER — POTASSIUM CHLORIDE CRYS ER 20 MEQ PO TBCR
20.0000 meq | EXTENDED_RELEASE_TABLET | Freq: Two times a day (BID) | ORAL | Status: DC
  Administered 2018-04-03: 20 meq via ORAL
  Filled 2018-04-03: qty 1

## 2018-04-03 MED ORDER — FUROSEMIDE 40 MG PO TABS
40.0000 mg | ORAL_TABLET | Freq: Two times a day (BID) | ORAL | Status: DC
  Administered 2018-04-03 – 2018-04-05 (×4): 40 mg via ORAL
  Filled 2018-04-03 (×4): qty 1

## 2018-04-03 MED ORDER — AMIODARONE HCL IN DEXTROSE 360-4.14 MG/200ML-% IV SOLN: 60.0000 mg/h | INTRAVENOUS | Status: AC

## 2018-04-03 MED ORDER — AMIODARONE HCL IN DEXTROSE 360-4.14 MG/200ML-% IV SOLN: 30.0000 mg/h | INTRAVENOUS | Status: DC

## 2018-04-03 NOTE — Progress Notes (Signed)
Cardiovascular and Pulmonary Nurse Navigator Note:    Rounded on patient to follow-up on education provided to patient and family last week.  Patient stated she is feeling a bit weary with this extended admission to the hospital, but definitely wants to know what is going on with her and why she is having all this fluid retention in her body.   Patient and husband wanting to see Financial Advisor, as they have questions about loss of income (patient was self employed), SSI, applying for disability and Medicaid.  Contacted Lizbeth Bark with [facility] at [phone] to inquire if Ms. Alveta Heimlich could meet with patient and husband to answer some of their questions.  Planned meeting for tomorrow morning at 9 a.m. Patient informed.    Roanna Epley, RN, BSN, [facility]  Cardiovascular & Pulmonary Nurse Navigator  Direct Line: [phone]  Department Phone #: [phone] Fax: [phone]  Email Address: [email or web address]

## 2018-04-03 NOTE — Progress Notes (Signed)
Progress Note  Patient Name: Ariana Riggs Date of Encounter: 04/03/2018  Primary Cardiologist:[facility], Dr. Saunders Revel  Subjective   Patient reported she still cannot lie flat; however, she has not been able to lie flat for 6 to 7 years because of her COPD, so she does not feel this is unusual for her.  She is off of oxygen now and feels that she is still short of breath but able to breathe better than when she was first admitted.  She has not ambulated in the hall yet, but has been walking back and forth from bathroom to bed.  She reported that she does not feel any symptoms of near syncope or dizziness with ambulation, despite low blood pressure.  She is not able to feel her high ventricular rates and is relatively asymptomatic in her atrial fibrillation.  She denies any cardiac complaint of chest pain, palpitations, or racing heart rate.  She did report oozing from her paracentesis site today. She also is reporting nausea today.  Inpatient Medications    Scheduled Meds: . ALPRAZolam  0.5 mg Oral Once  . apixaban  5 mg Oral BID  . budesonide  0.25 mg Nebulization BID  . buPROPion  150 mg Oral Daily  . ciprofloxacin  500 mg Oral BID  . citalopram  40 mg Oral Daily  . colchicine  0.6 mg Oral Daily  . furosemide  40 mg Intravenous Q12H  . ipratropium-albuterol  3 mL Nebulization BID  . metoprolol succinate  25 mg Oral Daily  . nicotine  14 mg Transdermal Daily  . pantoprazole  40 mg Oral Daily  . potassium chloride  40 mEq Oral TID  . predniSONE  30 mg Oral Q breakfast  . QUEtiapine  200 mg Oral QHS  . sodium chloride flush  3 mL Intravenous Q12H  . sucralfate  1 g Oral QID   Continuous Infusions: . sodium chloride    . amiodarone 60 mg/hr (04/03/18 1318)   PRN Meds: sodium chloride, acetaminophen **OR** acetaminophen, albuterol, bisacodyl, guaiFENesin-dextromethorphan, HYDROcodone-acetaminophen, metoprolol tartrate, ondansetron **OR** ondansetron (ZOFRAN) IV, senna-docusate,  sodium chloride flush   Vital Signs    Vitals:   04/03/18 0528 04/03/18 0817 04/03/18 0834 04/03/18 1014  BP: 114/83 (!) 86/75  102/72  Pulse: (!) 101 (!) 121  (!) 115  Resp: 20 18    Temp: 98 F (36.7 C) 98.2 F (36.8 C)    TempSrc: Oral Oral    SpO2: 97% 97% 93%   Weight: 106.1 kg     Height:        Intake/Output Summary (Last 24 hours) at 04/03/2018 1346 Last data filed at 04/03/2018 0529 Gross per 24 hour  Intake 880.19 ml  Output 750 ml  Net 130.19 ml   Filed Weights   04/01/18 0558 04/02/18 0429 04/03/18 0528  Weight: 105.5 kg 104.3 kg 106.1 kg    Telemetry    IRIR with ventricular rates 90-100s- Personally Reviewed  ECG    No new tracings - Personally Reviewed  Physical Exam   GEN:  Obese female.  NAD. Returned from restroom. Eating. Neck:  JVD difficult to assess due to body habitus.  Cardiac: IRIR, 2/6 systolic murmur, without rubs, or gallops.  Respiratory:  Diminished bibasilar breath sounds, slight wheezing GI:  Tense but not TTP MS: 3+ bilateral lower extremity edema; No deformity. Neuro:  Nonfocal  Psych: Normal affect  Labs    Chemistry Recent Labs  Lab 03/27/18 1724  04/01/18 0636 04/02/18  0440 04/03/18 0411  NA 135   < > 142 139 137  K 5.4*   < > 3.8 2.9* 3.5  CL 105   < > 100 99 98  CO2 22   < > 36* 32 28  GLUCOSE 107*   < > 104* 109* 146*  BUN 16   < > 26* 20 23*  CREATININE 0.83   < > 1.23* 0.99 1.15*  CALCIUM 8.6*   < > 8.5* 8.0* 8.5*  PROT 6.8  --   --   --   --   ALBUMIN 3.6  --   --   --   --   AST 42*  --   --   --   --   ALT 42  --   --   --   --   ALKPHOS 118  --   --   --   --   BILITOT 2.2*  --   --   --   --   GFRNONAA >60   < > 48* >60 52*  GFRAA >60   < > 56* >60 >60  ANIONGAP 8   < > 6 8 11    < > = values in this interval not displayed.     Hematology Recent Labs  Lab 03/29/18 0314 03/30/18 0313 04/03/18 0411  WBC 13.9* 11.3* 5.8  RBC 4.85 4.59 4.91  HGB 15.7* 15.1* 15.6*  HCT 48.1* 46.9* 47.3*  MCV  99.2 102.2* 96.3  MCH 32.4 32.9 31.8  MCHC 32.6 32.2 33.0  RDW 15.9* 15.9* 15.5  PLT 279 216 173    Cardiac Enzymes Recent Labs  Lab 03/28/18 0059 03/28/18 0656 03/28/18 1411  TROPONINI 0.05* 0.03* 0.03*   No results for input(s): TROPIPOC in the last 168 hours.   BNP Recent Labs  Lab 03/27/18 1430  BNP 212.0*     DDimer No results for input(s): DDIMER in the last 168 hours.   Radiology    No results found.  Cardiac Studies   Echocardiogram March 28, 2018 1. The left ventricle has moderate-severely reduced systolic function, with an ejection fraction of 30-35%. The cavity size was normal. Left ventricular diastolic Doppler parameters are indeterminate. 2. The right ventricle has moderately reduced systolic function. The cavity was moderately enlarged. There is no increase in right ventricular wall thickness. 3. Left atrial size was severely dilated. 4. Right atrial size was moderately dilated. 5. Mitral valve regurgitation is moderate to severe by color flow Doppler. 6. Tricuspid valve regurgitation is severe. 7. The inferior vena cava was dilated in size with <50% respiratory variability. 8. Rhythm is normal sinus   Patient Profile     58 y.o. female with a history of hyperlipidemia, COPD, asthma, current smoker, GERD, OSA on CPAP who is being seen today for the evaluation of HFrEF (EF 35%), respiratory distress, and A. fib with RVR.  Assessment & Plan    Acute Respiratory Distress - In setting of COPD/asthma, OSA, severe TR/MR and heart failure with significantly reduced right and left ventricle systolic function as above in most recent echocardiogram, and paroxysmal A. fib with RVR.  See below for recommendations regarding diuresis.  Paroxysmal Atrial fibrillation with RVR - Back in Afib; symptoms reported in Afib previously. Currently asx other than SOB. Admitted 2/10 with Afib with RVR. Converted to sinus rhythm ~7:30AM on 03/28/2018. Back in Afib  as of ~2/15. Currently Afib. - CHA2DS2VASc score of at least 2 (female, HFrEF) with recommendation for anticoagulation. Continue  Eliquis 5mg  BID. - Sarted on amiodarone with digoxin added yesterday 2/16 d/t continued poorly controlled rates and hypotension with HFrEF. Also on toprol 25mg  daily, limited by hypotension and with current BP 102/72. Ventricular rate still elevated in 90-100s. Will continue to monitor on telemetry. Will need to consider cardioversion in the near future this admission given reduced EF and if no improvement in rate and not back to SR to prevent worsening HF.  Hypokalemia  -Replete with goal 4.0.  Check magnesium.  Daily Bmet.  Troponin elevation -No CP - LDL 61 - Minimal elevation and in setting of rapid ventricular rate, hypotension, volume overload, infection, and hypoxia and 2/2 supply demand ischemia. No plan for ischemic evaluation at this time and given respiratory status with recommendation for ischemic evaluation once recovered /as outpatient.  HFrEF (EF 30-35%)  - Volume overloaded on exam with pitting edema but with consideration of renal function. Respiratory distress as above in HPI with h/o COPD/asthma / OSA / current smoker /volume overload with rapid ventricular rate and severely reduced EF and in setting of pleural effusions and lymphadenopathy.  - Continue IV diuresis - could consider reduced rate of IV lasix 20mg  daily given overnight bump in renal function. Still volume overloaded on exam. BNP 212.0 on 2/10 and will reassess today. Continue/ Daily BMET.  - Continue to  monitor renal function, electrolytes, and vitals in setting of IV diuresis.  Daily weights and I/Os. - Oxygen, nebs, and BiPAP as needed for continued SOB.   AKI / ARF - Improved renal function with diuresis. Avoid hypotension as will worsen with prerenal AKI. Recommend continue diuresis with suspicion of cardiorenal resulting in original worsening of renal function.   COPD / Current  Smoker - Smoking cessation advised. On nicotine patch    For questions or updates, please contact [place] Please consult [email or web address] for contact info under        Signed, Arvil Chaco, PA-C  04/03/2018, 1:46 PM

## 2018-04-03 NOTE — Progress Notes (Signed)
Patient converted to sinus bradycardia in the 50's,Dr.Arida on the floor and notified,he ordered to reduce the amiodarone rate to 16.59ml/hr and to stop the drip if heart rate drope below 40bpm.Dr.Wieting also notified .Close monitoring in progress.

## 2018-04-03 NOTE — Progress Notes (Signed)
Patient ID: Ariana Riggs, female   DOB: 06/22/1960, 58 y.o.   MRN: [id number]  [facility] PROGRESS NOTE  Ariana Riggs HER:[id number] DOB: 08-27-1960 DOA: 03/27/2018 PCP: Ezequiel Kayser, MD  HPI/Subjective: Patient feeling better with her toe pain.  Breathing a little bit better.  Some palpitations.  No chest pain or shortness of breath.  She states that she was leaking fluid from her paracentesis site.  Also states that she has had fluid from all of her body even her head.  Objective: Vitals:   04/03/18 0834 04/03/18 1014  BP:  102/72  Pulse:  (!) 115  Resp:    Temp:    SpO2: 93%     Filed Weights   04/01/18 0558 04/02/18 0429 04/03/18 0528  Weight: 105.5 kg 104.3 kg 106.1 kg    ROS: Review of Systems  Constitutional: Negative for chills and fever.  Eyes: Negative for blurred vision.  Respiratory: Negative for cough and shortness of breath.   Cardiovascular: Positive for palpitations. Negative for chest pain.  Gastrointestinal: Negative for abdominal pain, constipation, diarrhea, nausea and vomiting.  Genitourinary: Negative for dysuria.  Musculoskeletal: Positive for joint pain.  Neurological: Negative for dizziness and headaches.   Exam: Physical Exam  Constitutional: She is oriented to person, place, and time.  HENT:  Nose: No mucosal edema.  Mouth/Throat: No oropharyngeal exudate or posterior oropharyngeal edema.  Eyes: Pupils are equal, round, and reactive to light. Conjunctivae, EOM and lids are normal.  Neck: No JVD present. Carotid bruit is not present. No edema present. No thyroid mass and no thyromegaly present.  Cardiovascular: S1 normal and S2 normal. An irregularly irregular rhythm present. Tachycardia present. Exam reveals no gallop.  No murmur heard. Pulses:      Dorsalis pedis pulses are 2+ on the right side and 2+ on the left side.  Respiratory: No respiratory distress. She has decreased breath sounds in the right lower field and the left  lower field. She has no wheezes. She has no rhonchi. She has rales in the right lower field and the left lower field.  GI: Soft. Bowel sounds are normal. She exhibits distension. There is no abdominal tenderness.  Musculoskeletal:     Right ankle: She exhibits swelling.     Left ankle: She exhibits swelling.  Lymphadenopathy:    She has no cervical adenopathy.  Neurological: She is alert and oriented to person, place, and time. No cranial nerve deficit.  Skin: Skin is warm. No rash noted. Nails show no clubbing.  Psychiatric: She has a normal mood and affect.      Data Reviewed: Basic Metabolic Panel: Recent Labs  Lab 03/27/18 1724 03/28/18 0059  03/30/18 0313 03/31/18 0302 04/01/18 0636 04/02/18 0440 04/03/18 0411  NA 135 135   < > 138 137 142 139 137  K 5.4* 4.7   < > 4.3 4.3 3.8 2.9* 3.5  CL 105 104   < > 102 102 100 99 98  CO2 22 20*   < > 29 26 36* 32 28  GLUCOSE 107* 140*   < > 112* 98 104* 109* 146*  BUN 16 20   < > 42* 34* 26* 20 23*  CREATININE 0.83 1.33*   < > 1.35* 0.99 1.23* 0.99 1.15*  CALCIUM 8.6* 8.6*   < > 8.5* 8.6* 8.5* 8.0* 8.5*  MG 2.4 2.6*  --   --   --   --  1.9 2.2   < > = values in this  interval not displayed.   Liver Function Tests: Recent Labs  Lab 03/27/18 1724  AST 42*  ALT 42  ALKPHOS 118  BILITOT 2.2*  PROT 6.8  ALBUMIN 3.6   CBC: Recent Labs  Lab 03/27/18 1724 03/28/18 0059 03/29/18 0314 03/30/18 0313 04/03/18 0411  WBC 10.0 12.3* 13.9* 11.3* 5.8  NEUTROABS 6.9  --   --   --   --   HGB 15.0 16.0* 15.7* 15.1* 15.6*  HCT 46.2* 49.5* 48.1* 46.9* 47.3*  MCV 100.2* 101.0* 99.2 102.2* 96.3  PLT 236 290 279 216 173   Cardiac Enzymes: Recent Labs  Lab 03/28/18 0059 03/28/18 0656 03/28/18 1411  TROPONINI 0.05* 0.03* 0.03*   BNP (last 3 results) Recent Labs    03/27/18 1430  BNP 212.0*     CBG: Recent Labs  Lab 03/28/18 0021 03/28/18 2110  GLUCAP 152* 131*    Recent Results (from the past 240 hour(s))  MRSA PCR  Screening     Status: None   Collection Time: 03/28/18  2:10 AM  Result Value Ref Range Status   MRSA by PCR NEGATIVE NEGATIVE Final    Comment:        The GeneXpert MRSA Assay (FDA approved for NASAL specimens only), is one component of a comprehensive MRSA colonization surveillance program. It is not intended to diagnose MRSA infection nor to guide or monitor treatment for MRSA infections. Performed at [facility], [address] [place], [place] [address]   CULTURE, BLOOD (ROUTINE X 2) w Reflex to ID Panel     Status: None   Collection Time: 03/28/18  2:53 AM  Result Value Ref Range Status   Specimen Description BLOOD RIGHT [facility]  Final   Special Requests   Final    BOTTLES DRAWN AEROBIC AND ANAEROBIC Blood Culture adequate volume   Culture   Final    NO GROWTH 5 DAYS Performed at [facility], [address], [place], [place] [address]    Report Status 04/02/2018 FINAL  Final  Urine Culture     Status: None   Collection Time: 03/28/18  3:04 AM  Result Value Ref Range Status   Specimen Description   Final    URINE, RANDOM Performed at [facility], [address], [place], [place] [address]    Special Requests   Final    NONE Performed at [facility], [address] [place], [place] [address]    Culture   Final    NO GROWTH Performed at [facility], [place] [address], [place], [place] [address]    Report Status 03/29/2018 FINAL  Final  CULTURE, BLOOD (ROUTINE X 2) w Reflex to ID Panel     Status: None   Collection Time: 03/28/18  3:11 AM  Result Value Ref Range Status   Specimen Description BLOOD RIGHT [facility]  Final   Special Requests   Final    BOTTLES DRAWN AEROBIC AND ANAEROBIC Blood Culture adequate volume   Culture   Final    NO GROWTH 5 DAYS Performed at [facility], [address], [place], [place] [address]    Report Status 04/02/2018 FINAL  Final      Scheduled Meds: . ALPRAZolam  0.5 mg Oral  Once  . apixaban  5 mg Oral BID  . budesonide  0.25 mg Nebulization BID  . buPROPion  150 mg Oral Daily  . ciprofloxacin  500 mg Oral BID  . citalopram  40 mg Oral  Daily  . colchicine  0.6 mg Oral Daily  . furosemide  40 mg Intravenous Q12H  . ipratropium-albuterol  3 mL Nebulization BID  . metoprolol succinate  25 mg Oral Daily  . nicotine  14 mg Transdermal Daily  . pantoprazole  40 mg Oral Daily  . potassium chloride  40 mEq Oral TID  . predniSONE  30 mg Oral Q breakfast  . QUEtiapine  200 mg Oral QHS  . sodium chloride flush  3 mL Intravenous Q12H  . sucralfate  1 g Oral QID   Continuous Infusions: . sodium chloride    . amiodarone 60 mg/hr (04/03/18 1318)    Assessment/Plan:  1. Atrial fibrillation with rapid ventricular response.  Patient still on amiodarone drip. Heart rate still elevated.  Patient on low-dose metoprolol.  Eliquis for anticoagulation.  Blood pressure on the lower side limiting other medications. 2. Relative hypotension.  Limited with other medications secondary to hypotension.  Continue low-dose metoprolol. 3. Acute on chronic systolic congestive heart failure.  EF 30 to 35%.  Limited with other medications secondary to hypotension.  Low-dose metoprolol and IV Lasix. 4. Hypokalemia replace potassium while receiving IV Lasix.  Potassium better replaced today. 5. Acute gout left first toe.  Seems better today.  Uric acid level high will need allopurinol at some point but I rather have the gout settle down a couple days prior to starting.  Patient on prednisone and colchicine. 6. Abdominal distention and ascites.  3 L of fluid taken off on 03/29/2018.  Does not look like a culture was sent off but there was greater than 250 white blood cells.  Empiric Cipro ordered.  Prior to disposition we will see if there is more ascites there to drain off. 7. Ventricular tachycardia the other night.  On amiodarone. 8. Obesity with a BMI of 37.53 9. Anxiety on  Xanax 10. Bipolar disorder continue psychiatric medications  Code Status:     Code Status Orders  (From admission, onward)         Start     Ordered   03/27/18 1714  Full code  Continuous     03/27/18 1713        Code Status History    This patient has a current code status but no historical code status.     Disposition Plan: To be determined  Consultants:  Cardiology  Antibiotics: -Cipro  Time spent: 27 minutes.  Case discussed with nursing staff.  Case discussed with husband at the bedside.  Ariana Riggs [employer]

## 2018-04-04 ENCOUNTER — Encounter
Admission: AD | Disposition: A | Discharge status: Home Health | Payer: Self-pay | Source: Ambulatory Visit | Attending: Specialist

## 2018-04-04 ENCOUNTER — Inpatient Hospital Stay: Payer: 59

## 2018-04-04 DIAGNOSIS — I480 Paroxysmal atrial fibrillation: Secondary | ICD-10-CM

## 2018-04-04 DIAGNOSIS — I5021 Acute systolic (congestive) heart failure: Secondary | ICD-10-CM

## 2018-04-04 DIAGNOSIS — N179 Acute kidney failure, unspecified: Secondary | ICD-10-CM

## 2018-04-04 LAB — CBC WITH DIFFERENTIAL/PLATELET
Abs Immature Granulocytes: 0.07 10*3/uL (ref 0.00–0.07)
Basophils Absolute: 0 10*3/uL (ref 0.0–0.1)
Basophils Relative: 0 %
EOS PCT: 1 %
Eosinophils Absolute: 0.1 10*3/uL (ref 0.0–0.5)
HCT: 48.8 % — ABNORMAL HIGH (ref 36.0–46.0)
Hemoglobin: 16.3 g/dL — ABNORMAL HIGH (ref 12.0–15.0)
Immature Granulocytes: 1 %
Lymphocytes Relative: 20 %
Lymphs Abs: 2.2 10*3/uL (ref 0.7–4.0)
MCH: 32.2 pg (ref 26.0–34.0)
MCHC: 33.4 g/dL (ref 30.0–36.0)
MCV: 96.4 fL (ref 80.0–100.0)
Monocytes Absolute: 0.9 10*3/uL (ref 0.1–1.0)
Monocytes Relative: 9 %
NEUTROS ABS: 7.6 10*3/uL (ref 1.7–7.7)
NEUTROS PCT: 69 %
NRBC: 0 % (ref 0.0–0.2)
Platelets: 202 10*3/uL (ref 150–400)
RBC: 5.06 MIL/uL (ref 3.87–5.11)
RDW: 15.6 % — ABNORMAL HIGH (ref 11.5–15.5)
WBC: 10.9 10*3/uL — ABNORMAL HIGH (ref 4.0–10.5)

## 2018-04-04 LAB — BASIC METABOLIC PANEL
Anion gap: 10 (ref 5–15)
BUN: 43 mg/dL — ABNORMAL HIGH (ref 6–20)
CO2: 28 mmol/L (ref 22–32)
Calcium: 8.5 mg/dL — ABNORMAL LOW (ref 8.9–10.3)
Chloride: 96 mmol/L — ABNORMAL LOW (ref 98–111)
Creatinine, Ser: 1.46 mg/dL — ABNORMAL HIGH (ref 0.44–1.00)
GFR calc Af Amer: 46 mL/min — ABNORMAL LOW (ref >60)
GFR calc non Af Amer: 39 mL/min — ABNORMAL LOW (ref >60)
Glucose, Bld: 131 mg/dL — ABNORMAL HIGH (ref 70–99)
POTASSIUM: 4.4 mmol/L (ref 3.5–5.1)
Sodium: 134 mmol/L — ABNORMAL LOW (ref 135–145)

## 2018-04-04 LAB — BODY FLUID CELL COUNT WITH DIFFERENTIAL
Eos, Fluid: 0 %
Lymphs, Fluid: 64 %
MONOCYTE-MACROPHAGE-SEROUS FLUID: 8 %
Neutrophil Count, Fluid: 28 %
Total Nucleated Cell Count, Fluid: 369 cu mm

## 2018-04-04 LAB — GLUCOSE, PLEURAL OR PERITONEAL FLUID: Glucose, Fluid: 133 mg/dL

## 2018-04-04 LAB — PROTEIN, PLEURAL OR PERITONEAL FLUID: Total protein, fluid: 3.5 g/dL

## 2018-04-04 SURGERY — ECHOCARDIOGRAM, TRANSESOPHAGEAL
Anesthesia: Moderate Sedation

## 2018-04-04 SURGERY — CARDIOVERSION (CATH LAB)
Anesthesia: General

## 2018-04-04 MED ORDER — POTASSIUM CHLORIDE 20 MEQ PO PACK
20.0000 meq | PACK | Freq: Two times a day (BID) | ORAL | Status: DC
  Administered 2018-04-04 – 2018-04-05 (×3): 20 meq via ORAL
  Filled 2018-04-04 (×3): qty 1

## 2018-04-04 MED ORDER — PREDNISONE 5 MG PO TABS
5.0000 mg | ORAL_TABLET | Freq: Every day | ORAL | Status: DC
  Administered 2018-04-04 – 2018-04-05 (×2): 5 mg via ORAL
  Filled 2018-04-04 (×2): qty 1

## 2018-04-04 MED ORDER — PREDNISONE 10 MG PO TABS: 5.0000 mg | ORAL_TABLET | Freq: Every day | ORAL | Status: DC

## 2018-04-04 MED ORDER — LEVALBUTEROL HCL 0.63 MG/3ML IN NEBU
0.6300 mg | INHALATION_SOLUTION | Freq: Four times a day (QID) | RESPIRATORY_TRACT | Status: DC
  Administered 2018-04-04 – 2018-04-05 (×3): 0.63 mg via RESPIRATORY_TRACT
  Filled 2018-04-04 (×3): qty 3

## 2018-04-04 MED ORDER — ALLOPURINOL 300 MG PO TABS
150.0000 mg | ORAL_TABLET | Freq: Every day | ORAL | Status: DC
  Administered 2018-04-05: 150 mg via ORAL
  Filled 2018-04-04: qty 0.5

## 2018-04-04 MED ORDER — TRAZODONE HCL 50 MG PO TABS: 50.0000 mg | ORAL_TABLET | Freq: Every evening | ORAL | Status: DC | PRN

## 2018-04-04 MED ORDER — SPIRONOLACTONE 25 MG PO TABS
12.5000 mg | ORAL_TABLET | Freq: Every day | ORAL | Status: DC
  Administered 2018-04-05: 12.5 mg via ORAL
  Filled 2018-04-04: qty 1
  Filled 2018-04-04: qty 0.5

## 2018-04-04 MED ORDER — AMIODARONE HCL 200 MG PO TABS
400.0000 mg | ORAL_TABLET | Freq: Two times a day (BID) | ORAL | Status: DC
  Administered 2018-04-04 (×2): 400 mg via ORAL
  Filled 2018-04-04 (×2): qty 2

## 2018-04-04 MED ORDER — AMIODARONE HCL 200 MG PO TABS: 200.0000 mg | ORAL_TABLET | Freq: Two times a day (BID) | ORAL | Status: DC

## 2018-04-04 NOTE — Progress Notes (Signed)
Patient refused CPT at this time and throughout night. Patient states she does not want to be woken up for scheduled CPT or breathing treatments. SAT 94% on Room Air, Clear breath sounds. Resting comfortably in bed.

## 2018-04-04 NOTE — Procedures (Signed)
Ultrasound-guided diagnostic and therapeutic paracentesis performed yielding 2.7 liters of yellow fluid. No immediate complications. A portion of the fluid was sent to the lab for preordered studies. EBL none.

## 2018-04-04 NOTE — Progress Notes (Signed)
Progress Note  Patient Name: Ariana Riggs Date of Encounter: 04/04/2018  Primary Cardiologist:[facility], Dr. Saunders Revel  Subjective   She denies chest pain, palpitations, or racing heart rate. She continues to feel her SOB is improving; however, she did note some central chest tightness. She reported her nausea is also better from yesterday with the Zofran and change of potassium to powder supplement for food. She is eager to get fluid off during her paracentesis today. We discussed the change in amiodarone from drip to oral medication.   Inpatient Medications    Scheduled Meds: . ALPRAZolam  0.5 mg Oral Once  . apixaban  5 mg Oral BID  . budesonide  0.25 mg Nebulization BID  . buPROPion  150 mg Oral Daily  . ciprofloxacin  500 mg Oral BID  . citalopram  40 mg Oral Daily  . colchicine  0.6 mg Oral Daily  . furosemide  40 mg Oral BID  . ipratropium-albuterol  3 mL Nebulization BID  . metoprolol succinate  25 mg Oral Daily  . nicotine  14 mg Transdermal Daily  . pantoprazole  40 mg Oral Daily  . potassium chloride  20 mEq Oral BID  . predniSONE  30 mg Oral Q breakfast  . QUEtiapine  200 mg Oral QHS  . sodium chloride flush  3 mL Intravenous Q12H  . sucralfate  1 g Oral QID   Continuous Infusions: . sodium chloride    . amiodarone 30.06 mg/hr (04/03/18 1904)  . amiodarone 30.06 mg/hr (04/03/18 1920)   PRN Meds: sodium chloride, acetaminophen **OR** acetaminophen, albuterol, bisacodyl, guaiFENesin-dextromethorphan, HYDROcodone-acetaminophen, metoprolol tartrate, ondansetron **OR** ondansetron (ZOFRAN) IV, senna-docusate, sodium chloride flush   Vital Signs    Vitals:   04/03/18 2200 04/04/18 0227 04/04/18 0349 04/04/18 0722  BP:  97/79 109/72 112/77  Pulse:  84 67 60  Resp:    19  Temp:   98.5 F (36.9 C) (!) 97.3 F (36.3 C)  TempSrc:   Oral Oral  SpO2: 91%  93% 98%  Weight:   107.3 kg   Height:        Intake/Output Summary (Last 24 hours) at 04/04/2018 0817 Last  data filed at 04/04/2018 0351 Gross per 24 hour  Intake 333 ml  Output 2350 ml  Net -2017 ml   Filed Weights   04/02/18 0429 04/03/18 0528 04/04/18 0349  Weight: 104.3 kg 106.1 kg 107.3 kg    Telemetry    SR, rate in mid 60s- Personally Reviewed  ECG    No new tracings - Personally Reviewed  Physical Exam   GEN:  Obese female.  NAD. Getting blood work done. Neck:  JVD difficult to assess due to body habitus and as sitting at 90 degree angle Cardiac: sinus rhythm RRR, no m/r/g.  Respiratory:  Diminished bibasilar breath sounds, slight wheezing GI:  Tense/firm but not TTP MS: 1-2+ bilateral lower extremity edema; No deformity. Neuro:  Nonfocal  Psych: Normal affect  Labs    Chemistry Recent Labs  Lab 04/01/18 0636 04/02/18 0440 04/03/18 0411  NA 142 139 137  K 3.8 2.9* 3.5  CL 100 99 98  CO2 36* 32 28  GLUCOSE 104* 109* 146*  BUN 26* 20 23*  CREATININE 1.23* 0.99 1.15*  CALCIUM 8.5* 8.0* 8.5*  GFRNONAA 48* >60 52*  GFRAA 56* >60 >60  ANIONGAP 6 8 11      Hematology Recent Labs  Lab 03/29/18 0314 03/30/18 0313 04/03/18 0411  WBC 13.9* 11.3* 5.8  RBC 4.85 4.59 4.91  HGB 15.7* 15.1* 15.6*  HCT 48.1* 46.9* 47.3*  MCV 99.2 102.2* 96.3  MCH 32.4 32.9 31.8  MCHC 32.6 32.2 33.0  RDW 15.9* 15.9* 15.5  PLT 279 216 173    Cardiac Enzymes Recent Labs  Lab 03/28/18 1411  TROPONINI 0.03*   No results for input(s): TROPIPOC in the last 168 hours.   BNP Recent Labs  Lab 04/03/18 1502  BNP 701.0*     DDimer No results for input(s): DDIMER in the last 168 hours.   Radiology    No results found.  Cardiac Studies   Echocardiogram March 28, 2018 1. The left ventricle has moderate-severely reduced systolic function, with an ejection fraction of 30-35%. The cavity size was normal. Left ventricular diastolic Doppler parameters are indeterminate. 2. The right ventricle has moderately reduced systolic function. The cavity was moderately enlarged.  There is no increase in right ventricular wall thickness. 3. Left atrial size was severely dilated. 4. Right atrial size was moderately dilated. 5. Mitral valve regurgitation is moderate to severe by color flow Doppler. 6. Tricuspid valve regurgitation is severe. 7. The inferior vena cava was dilated in size with <50% respiratory variability. 8. Rhythm is normal sinus   Patient Profile     58 y.o. female with a history of hyperlipidemia, COPD, asthma, current smoker, GERD, OSA on CPAP who is being seen today for the evaluation of HFrEF (EF 35%), respiratory distress, and A. fib with RVR.  Assessment & Plan    Acute Respiratory Distress - In setting of COPD/asthma, OSA, severe TR/MR and heart failure with significantly reduced right and left ventricle systolic function as above in most recent echocardiogram, and paroxysmal A. fib with RVR.  See below for recommendations regarding diuresis.  Paroxysmal Atrial fibrillation with RVR - Cancelled TEE/DCCV. Converted back into SR again yesterday 2/17 and before today's procedure. Since going in and out of Afib, no further plans for TEE/DCCV this admission.  - Continue Eliquis 5mg  BID. CHA2DS2VASc score of at least 2 (female, HFrEF) with recommendation for anticoagulation.  - On amiodarone drip 60mg /hr since Saturday 2/15 and 30 mg/hr since Tuesday 2/18. Will transition to oral amiodarone starting with 400mg  BID for now plan for tapering down this dosage for rate control. BB escalation of Toprol 25mg  qd has been limited by hypotension this admission. Avoid tachycardic rates as this will contribute to her worsening heart failure.    Hypokalemia  -Replete with goal 4.0.  Check magnesium.  Pending today's Bmet after yesterday's repletion. Will likely need discharged with potassium supplementation in addition to diuresis at KCl tab or powder packet (d/t nausea) of 67mEq BID.   Troponin elevation - No CP. LDL 61. Minimal elevation and in setting  of rapid ventricular rate, hypotension, volume overload, infection, and hypoxia and 2/2 supply demand ischemia. No plan for ischemic evaluation at this time and given respiratory status with recommendation for ischemic evaluation once recovered /as outpatient.  HFrEF (EF 30-35%)  - Volume overloaded on exam and still SOB but improved since admission despite BNP increase from 212.0  701.0.  - Transitioned to oral lasix 40mg  bid yesterday. Continue/ Daily BMET (ordered BMET for today) and after yesterday's jump in Cr. Continue to  monitor renal function, electrolytes, and vitals in setting of IV diuresis.  Daily weights and I/Os. Oxygen, nebs, and BiPAP as needed for continued SOB.   AKI / ARF - Improved renal function with diuresis. Avoid hypotension as will worsen with prerenal AKI.  Recommend continue diuresis with suspicion of cardiorenal resulting in original worsening of renal function.   COPD / Current Smoker - Smoking cessation advised. On nicotine patch   For questions or updates, please contact [place] Please consult [email or web address] for contact info under        Signed, Arvil Chaco, PA-C  04/04/2018, 8:17 AM
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
## 2018-04-04 NOTE — Progress Notes (Signed)
Patient ID: Ariana Riggs, female   DOB: 1961-01-08, 58 y.o.   MRN: [id number]  [facility] PROGRESS NOTE  Sheng Pritz RXV:[id number] DOB: Jun 18, 1960 DOA: 03/27/2018 PCP: Ezequiel Kayser, MD  HPI/Subjective: Patient with some shortness of breath.  Converted over to normal sinus rhythm last night.  States she has had nausea vomiting with the potassium.  Has not slept in 2 nights.  Gout is better.  Objective: Vitals:   04/04/18 1025 04/04/18 1047  BP: 113/81 117/76  Pulse: 62 64  Resp: 18 18  Temp:    SpO2: 97% 96%    Filed Weights   04/02/18 0429 04/03/18 0528 04/04/18 0349  Weight: 104.3 kg 106.1 kg 107.3 kg    ROS: Review of Systems  Constitutional: Negative for chills and fever.  Eyes: Negative for blurred vision.  Respiratory: Positive for shortness of breath. Negative for cough.   Cardiovascular: Positive for palpitations. Negative for chest pain.  Gastrointestinal: Negative for abdominal pain, constipation, diarrhea, nausea and vomiting.  Genitourinary: Negative for dysuria.  Musculoskeletal: Positive for joint pain.  Neurological: Negative for dizziness and headaches.   Exam: Physical Exam  Constitutional: She is oriented to person, place, and time.  HENT:  Nose: No mucosal edema.  Mouth/Throat: No oropharyngeal exudate or posterior oropharyngeal edema.  Eyes: Pupils are equal, round, and reactive to light. Conjunctivae, EOM and lids are normal.  Neck: No JVD present. Carotid bruit is not present. No edema present. No thyroid mass and no thyromegaly present.  Cardiovascular: Regular rhythm, S1 normal and S2 normal. Exam reveals no gallop.  No murmur heard. Pulses:      Dorsalis pedis pulses are 2+ on the right side and 2+ on the left side.  Respiratory: No respiratory distress. She has decreased breath sounds in the right lower field and the left lower field. She has no wheezes. She has no rhonchi. She has no rales.  GI: Soft. Bowel sounds are normal.  She exhibits distension. There is no abdominal tenderness.  Musculoskeletal:     Right ankle: She exhibits swelling.     Left ankle: She exhibits swelling.  Lymphadenopathy:    She has no cervical adenopathy.  Neurological: She is alert and oriented to person, place, and time. No cranial nerve deficit.  Skin: Skin is warm. No rash noted. Nails show no clubbing.  Psychiatric: She has a normal mood and affect.      Data Reviewed: Basic Metabolic Panel: Recent Labs  Lab 03/31/18 0302 04/01/18 0636 04/02/18 0440 04/03/18 0411 04/04/18 0904  NA 137 142 139 137 134*  K 4.3 3.8 2.9* 3.5 4.4  CL 102 100 99 98 96*  CO2 26 36* 32 28 28  GLUCOSE 98 104* 109* 146* 131*  BUN 34* 26* 20 23* 43*  CREATININE 0.99 1.23* 0.99 1.15* 1.46*  CALCIUM 8.6* 8.5* 8.0* 8.5* 8.5*  MG  --   --  1.9 2.2  --    CBC: Recent Labs  Lab 03/29/18 0314 03/30/18 0313 04/03/18 0411 04/04/18 0904  WBC 13.9* 11.3* 5.8 10.9*  NEUTROABS  --   --   --  7.6  HGB 15.7* 15.1* 15.6* 16.3*  HCT 48.1* 46.9* 47.3* 48.8*  MCV 99.2 102.2* 96.3 96.4  PLT 279 216 173 202   Cardiac Enzymes: Recent Labs  Lab 03/28/18 1411  TROPONINI 0.03*   BNP (last 3 results) Recent Labs    03/27/18 1430 04/03/18 1502  BNP 212.0* 701.0*     CBG: Recent Labs  Lab 03/28/18 2110  GLUCAP 131*    Recent Results (from the past 240 hour(s))  MRSA PCR Screening     Status: None   Collection Time: 03/28/18  2:10 AM  Result Value Ref Range Status   MRSA by PCR NEGATIVE NEGATIVE Final    Comment:        The GeneXpert MRSA Assay (FDA approved for NASAL specimens only), is one component of a comprehensive MRSA colonization surveillance program. It is not intended to diagnose MRSA infection nor to guide or monitor treatment for MRSA infections. Performed at [facility], [address] [place], [place] [address]   CULTURE, BLOOD (ROUTINE X 2) w Reflex to ID Panel     Status: None   Collection Time:  03/28/18  2:53 AM  Result Value Ref Range Status   Specimen Description BLOOD RIGHT [facility]  Final   Special Requests   Final    BOTTLES DRAWN AEROBIC AND ANAEROBIC Blood Culture adequate volume   Culture   Final    NO GROWTH 5 DAYS Performed at [facility], [address], [place], [place] [address]    Report Status 04/02/2018 FINAL  Final  Urine Culture     Status: None   Collection Time: 03/28/18  3:04 AM  Result Value Ref Range Status   Specimen Description   Final    URINE, RANDOM Performed at [facility], [address] [place], [place] [address]    Special Requests   Final    NONE Performed at [facility], [address] [place], [place] [address]    Culture   Final    NO GROWTH Performed at [facility], [place] [address], [place], [place] [address]    Report Status 03/29/2018 FINAL  Final  CULTURE, BLOOD (ROUTINE X 2) w Reflex to ID Panel     Status: None   Collection Time: 03/28/18  3:11 AM  Result Value Ref Range Status   Specimen Description BLOOD RIGHT [facility]  Final   Special Requests   Final    BOTTLES DRAWN AEROBIC AND ANAEROBIC Blood Culture adequate volume   Culture   Final    NO GROWTH 5 DAYS Performed at [facility], [address], [place], [place] [address]    Report Status 04/02/2018 FINAL  Final      Scheduled Meds: . [START ON 04/05/2018] allopurinol  150 mg Oral Daily  . ALPRAZolam  0.5 mg Oral Once  . amiodarone  400 mg Oral BID   Followed by  . [START ON 04/09/2018] amiodarone  200 mg Oral BID  . apixaban  5 mg Oral BID  . buPROPion  150 mg Oral Daily  . ciprofloxacin  500 mg Oral BID  . citalopram  40 mg Oral Daily  . colchicine  0.6 mg Oral Daily  . furosemide  40 mg Oral BID  . levalbuterol  0.63 mg Nebulization Q6H  . metoprolol succinate  25 mg Oral Daily  . nicotine  14 mg Transdermal Daily  . pantoprazole  40 mg Oral Daily  . potassium chloride  20 mEq Oral BID  . predniSONE  5 mg Oral Q  breakfast  . QUEtiapine  200 mg Oral QHS  . sodium chloride flush  3 mL Intravenous Q12H  . sucralfate  1 g Oral QID   Continuous Infusions: . sodium chloride      Assessment/Plan:  1. Atrial fibrillation with rapid ventricular response.  Patient converted with  to normal sinus rhythm yesterday evening.  Cardiology switched amiodarone drip to oral amiodarone.  Also on Eliquis and low-dose beta-blocker. 2. Acute on chronic systolic congestive heart failure.  EF 30 to 35%.  Patient on low-dose metoprolol and cardiology switch to oral Lasix. 3. Hypokalemia.  Change potassium over to K-Lor. 4. Acute gout left first toe.  Improved.  Decrease prednisone down to 5 mg.  Continue colchicine low-dose.  Start low-dose allopurinol tomorrow 5. Abdominal distention and ascites.  CT scan concerning for cirrhosis.  Send off hepatitis profiles tomorrow.  3 L of fluid taken off on 03/29/2018.  Repeat abdominal paracentesis today drew off another 2.7 L of fluid.  Patient on empiric Cipro. 6. Pneumonia seen on previous CT scan.  Patient on Cipro. 7. Ventricular tachycardia the other night.  On amiodarone. 8. Obesity with a BMI of 37.53 9. Anxiety on Xanax 10. Bipolar disorder continue psychiatric medications  Code Status:     Code Status Orders  (From admission, onward)         Start     Ordered   03/27/18 1714  Full code  Continuous     03/27/18 1713        Code Status History    This patient has a current code status but no historical code status.     Disposition Plan: To be determined  Consultants:  Cardiology  Antibiotics: -Cipro  Time spent: 27 minutes.  Case discussed with husband at the bedside and cardiology  [employer]

## 2018-04-05 LAB — CBC
HEMATOCRIT: 50.3 % — AB (ref 36.0–46.0)
Hemoglobin: 16.4 g/dL — ABNORMAL HIGH (ref 12.0–15.0)
MCH: 31.8 pg (ref 26.0–34.0)
MCHC: 32.6 g/dL (ref 30.0–36.0)
MCV: 97.7 fL (ref 80.0–100.0)
Platelets: 179 10*3/uL (ref 150–400)
RBC: 5.15 MIL/uL — ABNORMAL HIGH (ref 3.87–5.11)
RDW: 15.3 % (ref 11.5–15.5)
WBC: 9.1 10*3/uL (ref 4.0–10.5)
nRBC: 0 % (ref 0.0–0.2)

## 2018-04-05 LAB — BASIC METABOLIC PANEL
Anion gap: 10 (ref 5–15)
BUN: 41 mg/dL — ABNORMAL HIGH (ref 6–20)
CHLORIDE: 99 mmol/L (ref 98–111)
CO2: 29 mmol/L (ref 22–32)
Calcium: 8.5 mg/dL — ABNORMAL LOW (ref 8.9–10.3)
Creatinine, Ser: 1.4 mg/dL — ABNORMAL HIGH (ref 0.44–1.00)
GFR calc Af Amer: 48 mL/min — ABNORMAL LOW (ref >60)
GFR calc non Af Amer: 41 mL/min — ABNORMAL LOW (ref >60)
Glucose, Bld: 100 mg/dL — ABNORMAL HIGH (ref 70–99)
Potassium: 3.9 mmol/L (ref 3.5–5.1)
Sodium: 138 mmol/L (ref 135–145)

## 2018-04-05 LAB — CYTOLOGY - NON PAP

## 2018-04-05 MED ORDER — AMIODARONE HCL 200 MG PO TABS: 200.0000 mg | ORAL_TABLET | Freq: Every day | ORAL | Status: DC

## 2018-04-05 MED ORDER — CIPROFLOXACIN HCL 500 MG PO TABS: ORAL_TABLET | ORAL | 0 refills | Status: DC

## 2018-04-05 MED ORDER — POTASSIUM CHLORIDE 20 MEQ PO PACK: 20.0000 meq | PACK | Freq: Every day | ORAL | 0 refills | Status: DC

## 2018-04-05 MED ORDER — AMIODARONE HCL 200 MG PO TABS
400.0000 mg | ORAL_TABLET | Freq: Two times a day (BID) | ORAL | Status: DC
  Administered 2018-04-05: 400 mg via ORAL
  Filled 2018-04-05: qty 2

## 2018-04-05 MED ORDER — APIXABAN 5 MG PO TABS: 5.0000 mg | ORAL_TABLET | Freq: Two times a day (BID) | ORAL | 0 refills | Status: DC

## 2018-04-05 MED ORDER — METOPROLOL SUCCINATE ER 25 MG PO TB24: 25.0000 mg | ORAL_TABLET | Freq: Every day | ORAL | 0 refills | Status: DC

## 2018-04-05 MED ORDER — AMIODARONE HCL 200 MG PO TABS: ORAL_TABLET | ORAL | 0 refills | Status: DC

## 2018-04-05 MED ORDER — COLCHICINE 0.6 MG PO TABS: 0.6000 mg | ORAL_TABLET | Freq: Every day | ORAL | 0 refills | Status: DC

## 2018-04-05 MED ORDER — SPIRONOLACTONE 25 MG PO TABS: 25.0000 mg | ORAL_TABLET | Freq: Every day | ORAL | 0 refills | Status: DC

## 2018-04-05 MED ORDER — OMEPRAZOLE 20 MG PO CPDR: 20.0000 mg | DELAYED_RELEASE_CAPSULE | Freq: Every day | ORAL | 0 refills | Status: DC

## 2018-04-05 MED ORDER — FUROSEMIDE 40 MG PO TABS: 40.0000 mg | ORAL_TABLET | Freq: Two times a day (BID) | ORAL | 0 refills | Status: DC

## 2018-04-05 MED ORDER — PREDNISONE 5 MG PO TABS: 5.0000 mg | ORAL_TABLET | Freq: Every day | ORAL | 0 refills | Status: DC

## 2018-04-05 MED ORDER — NICOTINE 14 MG/24HR TD PT24: MEDICATED_PATCH | TRANSDERMAL | 0 refills | Status: DC

## 2018-04-05 MED ORDER — ALLOPURINOL 300 MG PO TABS: 150.0000 mg | ORAL_TABLET | Freq: Every day | ORAL | 0 refills | Status: DC

## 2018-04-05 NOTE — Progress Notes (Signed)
Pt ambulated around nursing station - tolerated well - o2 sats 91% on RA with ambulation/Discharge instructions explained to pt and pts husband / verbalized an understanding/ iv and tele removed/ transported off unit via wheelchair.

## 2018-04-05 NOTE — Discharge Instructions (Signed)
Ascites    Ascites is a collection of too much fluid in the abdomen. Ascites can range from mild to severe. If ascites is not treated, it can get worse.  What are the causes?  This condition may be caused by:   A liver condition called cirrhosis. This is the most common cause of ascites.   Long-term (chronic) or alcoholic hepatitis.   Infection or inflammation in the abdomen.   Cancer in the abdomen.   Heart failure.   Kidney disease.   Inflammation of the pancreas.   Clots in the veins of the liver.  What are the signs or symptoms?  Symptoms of this condition include:   A feeling of fullness in the abdomen. This is common.   An increase in the size of the abdomen or waist.   Swelling in the legs.   Swelling of the scrotum (in men).   Difficulty breathing.   Pain in the abdomen.   Sudden weight gain.  If the condition is mild, you may not have symptoms.  How is this diagnosed?  This condition is diagnosed based on your medical history and a physical exam. Your health care provider may order imaging tests, such as an ultrasound or CT scan of your abdomen.  How is this treated?  Treatment for this condition depends on the cause of the ascites. It may include:   Taking a pill to make you urinate. This is called a water pill (diuretic pill).   Strictly reducing your salt (sodium) intake. Salt can cause extra fluid to be kept (retained) in the body, and this makes ascites worse.   Having a procedure to remove fluid from your abdomen (paracentesis).   Having a procedure that connects two of the major veins within your liver and relieves pressure on your liver. This is called a TIPS procedure (transjugular intrahepatic portosystemic shunt procedure).   Placement of a drainage catheter (peritoneovenous shunt) to manage the extra fluid in the abdomen.  Ascites may go away or improve when the condition that caused it is treated.  Follow these instructions at home:   Keep track of your weight. To do this,  weigh yourself at the same time every day and write down your weight.   Keep track of how much you drink and any changes in how much or how often you urinate.   Follow any instructions that your health care provider gives you about how much to drink.   Try not to eat salty (high-sodium) foods.   Take over-the-counter and prescription medicines only as told by your health care provider.   Keep all follow-up visits as told by your health care provider. This is important.   Report any changes in your health to your health care provider, especially if you develop new symptoms or your symptoms get worse.  Contact a health care provider if:   You gain more than 3 lb (1.36 kg) in 3 days.   Your waist size increases.   You have new swelling in your legs.   The swelling in your legs gets worse.  Get help right away if:   You have a fever.   You are confused.   You have new or worsening breathing trouble.   You have new or worsening pain in your abdomen.   You have new or worsening swelling in the scrotum (in men).  Summary   Ascites is a collection of too much fluid in the abdomen.   Ascites may be caused   by various conditions, such as cirrhosis, hepatitis, cancer, or congestive heart failure.   Symptoms may include swelling of the abdomen and other areas due to extra fluid in the body.   Treatments may involve dietary changes, medicines, or procedures.  This information is not intended to replace advice given to you by your health care provider. Make sure you discuss any questions you have with your health care provider.  Document Released: 02/01/2005 Document Revised: 10/14/2016 Document Reviewed: 10/14/2016  [employer] Interactive Patient Education  2019 [employer]
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
## 2018-04-06 LAB — HEPATITIS B SURFACE ANTIGEN: Hepatitis B Surface Ag: NEGATIVE

## 2018-04-06 LAB — HEPATITIS B CORE ANTIBODY, TOTAL: Hep B Core Total Ab: NEGATIVE

## 2018-04-06 LAB — HEPATITIS B SURFACE ANTIBODY, QUANTITATIVE: Hep B S AB Quant (Post): <3.1 m[IU]/mL — ABNORMAL LOW (ref >9.9)

## 2018-04-06 LAB — HEPATITIS C ANTIBODY: HCV Ab: <0.1 s/co ratio (ref 0.0–0.9)

## 2018-04-08 LAB — BODY FLUID CULTURE: Culture: NO GROWTH
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
## 2018-04-10 ENCOUNTER — Ambulatory Visit: Payer: 59 | Attending: Family | Admitting: Family

## 2018-04-10 ENCOUNTER — Encounter: Payer: Self-pay | Admitting: Family

## 2018-04-10 VITALS — BP 102/62 | HR 59 | Resp 18 | Ht 66.0 in | Wt 225.4 lb

## 2018-04-10 DIAGNOSIS — Z818 Family history of other mental and behavioral disorders: Secondary | ICD-10-CM | POA: Diagnosis not present

## 2018-04-10 DIAGNOSIS — Z7901 Long term (current) use of anticoagulants: Secondary | ICD-10-CM | POA: Insufficient documentation

## 2018-04-10 DIAGNOSIS — F319 Bipolar disorder, unspecified: Secondary | ICD-10-CM | POA: Insufficient documentation

## 2018-04-10 DIAGNOSIS — Z87891 Personal history of nicotine dependence: Secondary | ICD-10-CM | POA: Insufficient documentation

## 2018-04-10 DIAGNOSIS — Z9049 Acquired absence of other specified parts of digestive tract: Secondary | ICD-10-CM | POA: Insufficient documentation

## 2018-04-10 DIAGNOSIS — J449 Chronic obstructive pulmonary disease, unspecified: Secondary | ICD-10-CM | POA: Diagnosis not present

## 2018-04-10 DIAGNOSIS — Z881 Allergy status to other antibiotic agents status: Secondary | ICD-10-CM | POA: Diagnosis not present

## 2018-04-10 DIAGNOSIS — Z882 Allergy status to sulfonamides status: Secondary | ICD-10-CM | POA: Diagnosis not present

## 2018-04-10 DIAGNOSIS — Z888 Allergy status to other drugs, medicaments and biological substances status: Secondary | ICD-10-CM | POA: Insufficient documentation

## 2018-04-10 DIAGNOSIS — I4891 Unspecified atrial fibrillation: Secondary | ICD-10-CM | POA: Diagnosis not present

## 2018-04-10 DIAGNOSIS — K219 Gastro-esophageal reflux disease without esophagitis: Secondary | ICD-10-CM | POA: Diagnosis not present

## 2018-04-10 DIAGNOSIS — M1990 Unspecified osteoarthritis, unspecified site: Secondary | ICD-10-CM | POA: Insufficient documentation

## 2018-04-10 DIAGNOSIS — G4730 Sleep apnea, unspecified: Secondary | ICD-10-CM | POA: Diagnosis not present

## 2018-04-10 DIAGNOSIS — I890 Lymphedema, not elsewhere classified: Secondary | ICD-10-CM | POA: Diagnosis not present

## 2018-04-10 DIAGNOSIS — Z8249 Family history of ischemic heart disease and other diseases of the circulatory system: Secondary | ICD-10-CM | POA: Diagnosis not present

## 2018-04-10 DIAGNOSIS — Z79899 Other long term (current) drug therapy: Secondary | ICD-10-CM | POA: Diagnosis not present

## 2018-04-10 DIAGNOSIS — Z7952 Long term (current) use of systemic steroids: Secondary | ICD-10-CM | POA: Diagnosis not present

## 2018-04-10 DIAGNOSIS — F419 Anxiety disorder, unspecified: Secondary | ICD-10-CM | POA: Insufficient documentation

## 2018-04-10 DIAGNOSIS — Z880 Allergy status to penicillin: Secondary | ICD-10-CM | POA: Diagnosis not present

## 2018-04-10 DIAGNOSIS — I5042 Chronic combined systolic (congestive) and diastolic (congestive) heart failure: Secondary | ICD-10-CM | POA: Insufficient documentation

## 2018-04-10 DIAGNOSIS — I5022 Chronic systolic (congestive) heart failure: Secondary | ICD-10-CM | POA: Diagnosis not present

## 2018-04-10 DIAGNOSIS — Z833 Family history of diabetes mellitus: Secondary | ICD-10-CM | POA: Diagnosis not present

## 2018-04-10 MED ORDER — POTASSIUM CHLORIDE 20 MEQ PO PACK: 20.0000 meq | PACK | Freq: Every day | ORAL | 12 refills | Status: DC
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
## 2018-04-10 NOTE — Patient Instructions (Signed)
Continue weighing daily and call for an overnight weight gain of > 2 pounds or a weekly weight gain of >5 pounds. 

## 2018-04-11 ENCOUNTER — Telehealth: Payer: Self-pay

## 2018-04-11 NOTE — Telephone Encounter (Signed)
Flagged on EMMI report for having unfilled prescriptions and questions regarding discharge papers.  Called and spoke with patient.  She was able to get everything she needed and did not have any questions regarding her discharge papers.  She did have some concerns on how to afford her Eliquis as she was given a coupon for a month's supply when she left.  Encouraged her to try [employer] and search online for sites detailing prices for area pharmacies to assist with cost. No other questions or concerns at this time. I thanked her for her time.

## 2018-04-20 DIAGNOSIS — I480 Paroxysmal atrial fibrillation: Secondary | ICD-10-CM | POA: Diagnosis not present

## 2018-04-20 DIAGNOSIS — Z79899 Other long term (current) drug therapy: Secondary | ICD-10-CM | POA: Diagnosis not present

## 2018-04-20 DIAGNOSIS — M10072 Idiopathic gout, left ankle and foot: Secondary | ICD-10-CM | POA: Diagnosis not present
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
## 2018-04-25 ENCOUNTER — Ambulatory Visit (INDEPENDENT_AMBULATORY_CARE_PROVIDER_SITE_OTHER): Payer: 59 | Admitting: Nurse Practitioner

## 2018-04-25 ENCOUNTER — Encounter: Payer: Self-pay | Admitting: Nurse Practitioner

## 2018-04-25 VITALS — BP 120/60 | HR 58 | Ht 65.5 in | Wt 199.0 lb

## 2018-04-25 DIAGNOSIS — I429 Cardiomyopathy, unspecified: Secondary | ICD-10-CM | POA: Diagnosis not present

## 2018-04-25 DIAGNOSIS — I5022 Chronic systolic (congestive) heart failure: Secondary | ICD-10-CM

## 2018-04-25 DIAGNOSIS — I10 Essential (primary) hypertension: Secondary | ICD-10-CM

## 2018-04-25 DIAGNOSIS — R188 Other ascites: Secondary | ICD-10-CM

## 2018-04-25 DIAGNOSIS — J449 Chronic obstructive pulmonary disease, unspecified: Secondary | ICD-10-CM | POA: Diagnosis not present

## 2018-04-25 DIAGNOSIS — I4819 Other persistent atrial fibrillation: Secondary | ICD-10-CM

## 2018-04-25 DIAGNOSIS — N183 Chronic kidney disease, stage 3 unspecified: Secondary | ICD-10-CM

## 2018-04-25 DIAGNOSIS — F1910 Other psychoactive substance abuse, uncomplicated: Secondary | ICD-10-CM

## 2018-04-25 MED ORDER — LOSARTAN POTASSIUM 25 MG PO TABS: 25.0000 mg | ORAL_TABLET | Freq: Every day | ORAL | 3 refills | Status: DC

## 2018-04-25 MED ORDER — AMIODARONE HCL 200 MG PO TABS: 200.0000 mg | ORAL_TABLET | Freq: Every day | ORAL | 3 refills | Status: DC

## 2018-04-25 NOTE — Patient Instructions (Addendum)
Medication Instructions:  Your physician has recommended you make the following change in your medication: 1- START Losartan Take 1 tablet (25 mg total) by mouth daily  If you need a refill on your cardiac medications before your next appointment, please call your pharmacy.   Lab work: Your physician recommends that you return to clinic for lab work in: 1 week on ___________ @__________AM /PM. Labs included are; BMET. We will contact you with results in 1-2 business days.   If you have labs (blood work) drawn today and your tests are completely normal, you will receive your results only by: Marland Kitchen MyChart Message (if you have MyChart) OR . A paper copy in the mail If you have any lab test that is abnormal or we need to change your treatment, we will call you to review the results.  Testing/Procedures: 1- Lexi [facility] MYOVIEW  Your caregiver has ordered a Stress Test with nuclear imaging. The purpose of this test is to evaluate the blood supply to your heart muscle. This procedure is referred to as a "Non-Invasive Stress Test." This is because other than having an IV started in your vein, nothing is inserted or "invades" your body. Cardiac stress tests are done to find areas of poor blood flow to the heart by determining the extent of coronary artery disease (CAD). Some patients exercise on a treadmill, which naturally increases the blood flow to your heart, while others who are  unable to walk on a treadmill due to physical limitations have a pharmacologic/chemical stress agent called Lexiscan . This medicine will mimic walking on a treadmill by temporarily increasing your coronary blood flow.   Please note: these test may take anywhere between 2-4 hours to complete  PLEASE REPORT TO [facility] AT THE FIRST DESK WILL DIRECT YOU WHERE TO GO  Date of Procedure:_____________________________________  Arrival Time for  Procedure:______________________________  Instructions regarding medication:    __x__:  Hold betablocker(s) night before procedure and morning of procedure (metoprolol)  __x__:  Hold other medications as follows:______Lasix___________________________________________________________________________________________________________________________________________________________________________________________________________________________________________________________________________________  PLEASE NOTIFY THE OFFICE AT LEAST 24 HOURS IN ADVANCE IF YOU ARE UNABLE TO KEEP YOUR APPOINTMENT.  [phone] AND  PLEASE NOTIFY NUCLEAR MEDICINE AT [facility] AT LEAST 24 HOURS IN ADVANCE IF YOU ARE UNABLE TO KEEP YOUR APPOINTMENT. [phone]  How to prepare for your Myoview test:  1. Do not eat or drink after midnight 2. No caffeine for 24 hours prior to test 3. No smoking 24 hours prior to test. 4. Your medication may be taken with water.  If your doctor stopped a medication because of this test, do not take that medication. 5. Ladies, please do not wear dresses.  Skirts or pants are appropriate. Please wear a short sleeve shirt. 6. No perfume, cologne or lotion. 7. Wear comfortable walking shoes. No heels!  Follow-Up: At [facility], you and your health needs are our priority.  As part of our continuing mission to provide you with exceptional heart care, we have created designated Provider Care Teams.  These Care Teams include your primary Cardiologist (physician) and Advanced Practice Providers (APPs -  Physician Assistants and Nurse Practitioners) who all work together to provide you with the care you need, when you need it. You will need a follow up appointment in 6 weeks.  You may see Nelva Bush, MD or Murray Hodgkins, NP

## 2018-04-27 ENCOUNTER — Telehealth: Payer: Self-pay | Admitting: Nurse Practitioner

## 2018-04-27 MED ORDER — LOSARTAN POTASSIUM 25 MG PO TABS: 25.0000 mg | ORAL_TABLET | Freq: Every day | ORAL | 3 refills | Status: DC

## 2018-04-27 NOTE — Telephone Encounter (Signed)
Please call patient to discuss Losartan, states pharmacy is no longer able to get. Also needs to discuss Amiodarone doseage

## 2018-04-27 NOTE — Telephone Encounter (Signed)
Returned the pt call. Pt sts that [facility] is unable to fill her new prescription for Losartan 25mg  daily. Adv the pt that [facility] pharmacies have had a back order with their Losartan supply. Adv her that other pharmacies have been able to fill Losartan. The pt rqst that the Losartan Rx be sent to the [facility] in [place]. She also wanted confirm that her Amiodarone dosage should be 200mg  daily. Adv her that is what is listed on her 04/25/18 appt with Lucillie Garfinkel and an Rx was sent to her pharmacy that day. Pt verbalizes understanding and voiced appreciation for the call.

## 2018-04-28 ENCOUNTER — Ambulatory Visit
Admission: RE | Admit: 2018-04-28 | Discharge: 2018-04-28 | Disposition: A | Discharge status: Home / Self Care | Payer: 59 | Source: Ambulatory Visit | Attending: Nurse Practitioner | Admitting: Nurse Practitioner

## 2018-04-28 ENCOUNTER — Other Ambulatory Visit: Payer: Self-pay

## 2018-04-28 DIAGNOSIS — I429 Cardiomyopathy, unspecified: Secondary | ICD-10-CM | POA: Insufficient documentation

## 2018-04-28 LAB — NM MYOCAR MULTI W/SPECT W/WALL MOTION / EF
Estimated workload: 1 METS
Exercise duration (min): 0 min
Exercise duration (sec): 0 s
LV dias vol: 154 mL (ref 46–106)
LV sys vol: 68 mL
MPHR: 162 {beats}/min
Peak HR: 65 {beats}/min
Percent HR: 40 %
Rest HR: 60 {beats}/min
SDS: 6
SRS: 2
SSS: 8
TID: 0.92

## 2018-04-28 MED ORDER — TECHNETIUM TC 99M TETROFOSMIN IV KIT
10.0000 | PACK | Freq: Once | INTRAVENOUS | Status: AC | PRN
  Administered 2018-04-28: 10.97 via INTRAVENOUS

## 2018-04-28 MED ORDER — REGADENOSON 0.4 MG/5ML IV SOLN
0.4000 mg | Freq: Once | INTRAVENOUS | Status: AC
  Administered 2018-04-28: 0.4 mg via INTRAVENOUS
  Filled 2018-04-28: qty 5

## 2018-04-28 MED ORDER — TECHNETIUM TC 99M TETROFOSMIN IV KIT
32.5700 | PACK | Freq: Once | INTRAVENOUS | Status: AC | PRN
  Administered 2018-04-28: 32.57 via INTRAVENOUS

## 2018-05-01 ENCOUNTER — Telehealth: Payer: Self-pay | Admitting: *Deleted

## 2018-05-01 DIAGNOSIS — I429 Cardiomyopathy, unspecified: Secondary | ICD-10-CM

## 2018-05-01 DIAGNOSIS — Z13220 Encounter for screening for lipoid disorders: Secondary | ICD-10-CM

## 2018-05-01 DIAGNOSIS — Z79899 Other long term (current) drug therapy: Secondary | ICD-10-CM

## 2018-05-01 MED ORDER — ATORVASTATIN CALCIUM 10 MG PO TABS: 10.0000 mg | ORAL_TABLET | Freq: Every day | ORAL | 3 refills | Status: DC

## 2018-05-01 NOTE — Telephone Encounter (Signed)
Results called to pt. Pt verbalized understanding of results, to start lipitor 10 mg and follow up with lab results in about 6 weeks. She is aware to be fasting. Scheduled for labs on 04/20/18. Rx sent to pharmacy.

## 2018-05-01 NOTE — Telephone Encounter (Signed)
-----   Message from Theora Gianotti, NP sent at 05/01/2018  9:33 AM EDT ----- Low risk stress test with area of artifact but no areas concerning for ischemia (reduced blood flow w/ stress).  Heart squeezing function now registering as nl, though we will confirm this at a later date with echo.  Mild calcium noted in the aorta.  Given age and risk factors, we should add lipitor 10mg  daily to her regimen w/ plan to f/u lipids/lft's in 6 wks.

## 2018-05-02 ENCOUNTER — Other Ambulatory Visit: Payer: Self-pay

## 2018-05-02 ENCOUNTER — Other Ambulatory Visit (INDEPENDENT_AMBULATORY_CARE_PROVIDER_SITE_OTHER): Payer: 59 | Admitting: *Deleted

## 2018-05-02 DIAGNOSIS — I4819 Other persistent atrial fibrillation: Secondary | ICD-10-CM

## 2018-05-03 DIAGNOSIS — J849 Interstitial pulmonary disease, unspecified: Secondary | ICD-10-CM | POA: Diagnosis not present

## 2018-05-03 DIAGNOSIS — R590 Localized enlarged lymph nodes: Secondary | ICD-10-CM | POA: Diagnosis not present

## 2018-05-03 DIAGNOSIS — J90 Pleural effusion, not elsewhere classified: Secondary | ICD-10-CM | POA: Diagnosis not present

## 2018-05-03 LAB — BASIC METABOLIC PANEL
BUN / CREAT RATIO: 17 (ref 9–23)
BUN: 19 mg/dL (ref 6–24)
CO2: 23 mmol/L (ref 20–29)
Calcium: 9.6 mg/dL (ref 8.7–10.2)
Chloride: 101 mmol/L (ref 96–106)
Creatinine, Ser: 1.15 mg/dL — ABNORMAL HIGH (ref 0.57–1.00)
GFR calc Af Amer: 61 mL/min/{1.73_m2} (ref >59)
GFR calc non Af Amer: 53 mL/min/{1.73_m2} — ABNORMAL LOW (ref >59)
Glucose: 129 mg/dL — ABNORMAL HIGH (ref 65–99)
Potassium: 4.3 mmol/L (ref 3.5–5.2)
Sodium: 142 mmol/L (ref 134–144)

## 2018-05-04 ENCOUNTER — Other Ambulatory Visit: Payer: Self-pay | Admitting: Specialist

## 2018-05-04 DIAGNOSIS — J849 Interstitial pulmonary disease, unspecified: Secondary | ICD-10-CM

## 2018-05-09 ENCOUNTER — Ambulatory Visit: Payer: 59 | Attending: Family | Admitting: Family

## 2018-05-09 ENCOUNTER — Encounter: Payer: Self-pay | Admitting: Pharmacist

## 2018-05-09 ENCOUNTER — Other Ambulatory Visit: Payer: Self-pay

## 2018-05-09 ENCOUNTER — Encounter: Payer: Self-pay | Admitting: Family

## 2018-05-09 VITALS — BP 125/63 | HR 69 | Temp 98.2°F | Resp 18 | Ht 65.0 in | Wt 199.4 lb

## 2018-05-09 DIAGNOSIS — Z79899 Other long term (current) drug therapy: Secondary | ICD-10-CM | POA: Diagnosis not present

## 2018-05-09 DIAGNOSIS — Z881 Allergy status to other antibiotic agents status: Secondary | ICD-10-CM | POA: Diagnosis not present

## 2018-05-09 DIAGNOSIS — I4891 Unspecified atrial fibrillation: Secondary | ICD-10-CM | POA: Diagnosis not present

## 2018-05-09 DIAGNOSIS — Z7901 Long term (current) use of anticoagulants: Secondary | ICD-10-CM | POA: Diagnosis not present

## 2018-05-09 DIAGNOSIS — Z880 Allergy status to penicillin: Secondary | ICD-10-CM | POA: Insufficient documentation

## 2018-05-09 DIAGNOSIS — M109 Gout, unspecified: Secondary | ICD-10-CM | POA: Insufficient documentation

## 2018-05-09 DIAGNOSIS — F319 Bipolar disorder, unspecified: Secondary | ICD-10-CM | POA: Insufficient documentation

## 2018-05-09 DIAGNOSIS — I890 Lymphedema, not elsewhere classified: Secondary | ICD-10-CM | POA: Diagnosis not present

## 2018-05-09 DIAGNOSIS — Z87891 Personal history of nicotine dependence: Secondary | ICD-10-CM | POA: Diagnosis not present

## 2018-05-09 DIAGNOSIS — J441 Chronic obstructive pulmonary disease with (acute) exacerbation: Secondary | ICD-10-CM | POA: Diagnosis not present

## 2018-05-09 DIAGNOSIS — G4730 Sleep apnea, unspecified: Secondary | ICD-10-CM | POA: Insufficient documentation

## 2018-05-09 DIAGNOSIS — I5022 Chronic systolic (congestive) heart failure: Secondary | ICD-10-CM

## 2018-05-09 DIAGNOSIS — Z8249 Family history of ischemic heart disease and other diseases of the circulatory system: Secondary | ICD-10-CM | POA: Diagnosis not present

## 2018-05-09 DIAGNOSIS — F419 Anxiety disorder, unspecified: Secondary | ICD-10-CM | POA: Insufficient documentation

## 2018-05-09 DIAGNOSIS — K219 Gastro-esophageal reflux disease without esophagitis: Secondary | ICD-10-CM | POA: Diagnosis not present

## 2018-05-09 DIAGNOSIS — I429 Cardiomyopathy, unspecified: Secondary | ICD-10-CM | POA: Insufficient documentation

## 2018-05-09 DIAGNOSIS — E785 Hyperlipidemia, unspecified: Secondary | ICD-10-CM | POA: Diagnosis not present

## 2018-05-09 DIAGNOSIS — J449 Chronic obstructive pulmonary disease, unspecified: Secondary | ICD-10-CM

## 2018-05-09 DIAGNOSIS — I509 Heart failure, unspecified: Secondary | ICD-10-CM | POA: Insufficient documentation

## 2018-05-09 DIAGNOSIS — Z882 Allergy status to sulfonamides status: Secondary | ICD-10-CM | POA: Insufficient documentation

## 2018-05-09 NOTE — Patient Instructions (Signed)
Continue weighing daily and call for an overnight weight gain of > 2 pounds or a weekly weight gain of >5 pounds. 
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
## 2018-05-22 ENCOUNTER — Telehealth: Payer: Self-pay

## 2018-05-22 NOTE — Telephone Encounter (Signed)
Virtual Visit Pre-Appointment Phone Call  Steps For Call:  1. Confirm consent - "In the setting of the current Covid19 crisis, you are scheduled for a video visit with your provider on June 08, 2018 at 2:30PM.  Just as we do with many in-office visits, in order for you to participate in this visit, we must obtain consent.  If you'd like, I can send this to your mychart (if signed up) or email for you to review.  Otherwise, I can obtain your verbal consent now.  All virtual visits are billed to your insurance company just like a normal visit would be.  By agreeing to a virtual visit, we'd like you to understand that the technology does not allow for your provider to perform an examination, and thus may limit your provider's ability to fully assess your condition.  Finally, though the technology is pretty good, we cannot assure that it will always work on either your or our end, and in the setting of a video visit, we may have to convert it to a phone-only visit.  In either situation, we cannot ensure that we have a secure connection.  Are you willing to proceed?"  2. Give patient instructions for WebEx download to smartphone as below if video visit  3. Advise patient to be prepared with any vital sign or heart rhythm information, their current medicines, and a piece of paper and pen handy for any instructions they may receive the day of their visit  4. Inform patient they will receive a phone call 15 minutes prior to their appointment time (may be from unknown caller ID) so they should be prepared to answer  5. Confirm that appointment type is correct in Epic appointment notes (video vs telephone)    TELEPHONE CALL NOTE  Ariana Riggs has been deemed a candidate for a follow-up tele-health visit to limit community exposure during the Covid-19 pandemic. I spoke with the patient via phone to ensure availability of phone/video source, confirm preferred email & phone number, and discuss  instructions and expectations.  I reminded Ariana Riggs to be prepared with any vital sign and/or heart rhythm information that could potentially be obtained via home monitoring, at the time of her visit. I reminded Ariana Riggs to expect a phone call at the time of her visit if her visit.  Did the patient verbally acknowledge consent to treatment? YES  Ariana Riggs 05/22/2018 3:52 PM   DOWNLOADING THE [place]  - If Apple, go to [employer] and type in WebEx in the search bar. [place] [employer], the blue/green circle. The app is free but as with any other app downloads, their phone may require them to verify saved payment information or Apple password. The patient does NOT have to create an account.  - If Android, ask patient to go to [employer] and type in WebEx in the search bar. [place] [employer], the blue/green circle. The app is free but as with any other app downloads, their phone may require them to verify saved payment information or Android password. The patient does NOT have to create an account.   CONSENT FOR TELE-HEALTH VISIT - PLEASE REVIEW  I hereby voluntarily request, consent and authorize [facility] and its employed or contracted physicians, physician assistants, nurse practitioners or other licensed health care professionals (the Practitioner), to provide me with telemedicine health care services (the "Services") as deemed necessary by the treating Practitioner.  I acknowledge and consent to receive the Services by the Practitioner via telemedicine. I understand that the telemedicine visit will involve communicating with the Practitioner through live audiovisual communication technology and the disclosure of certain medical information by electronic transmission. I acknowledge that I have been given the opportunity to request an in-person assessment or other available alternative prior to the telemedicine  visit and am voluntarily participating in the telemedicine visit.  I understand that I have the right to withhold or withdraw my consent to the use of telemedicine in the course of my care at any time, without affecting my right to future care or treatment, and that the Practitioner or I may terminate the telemedicine visit at any time. I understand that I have the right to inspect all information obtained and/or recorded in the course of the telemedicine visit and may receive copies of available information for a reasonable fee.  I understand that some of the potential risks of receiving the Services via telemedicine include:  Marland Kitchen Delay or interruption in medical evaluation due to technological equipment failure or disruption; . Information transmitted may not be sufficient (e.g. poor resolution of images) to allow for appropriate medical decision making by the Practitioner; and/or  . In rare instances, security protocols could fail, causing a breach of personal health information.  Furthermore, I acknowledge that it is my responsibility to provide information about my medical history, conditions and care that is complete and accurate to the best of my ability. I acknowledge that Practitioner's advice, recommendations, and/or decision may be based on factors not within their control, such as incomplete or inaccurate data provided by me or distortions of diagnostic images or specimens that may result from electronic transmissions. I understand that the practice of medicine is not an exact science and that Practitioner makes no warranties or guarantees regarding treatment outcomes. I acknowledge that I will receive a copy of this consent concurrently upon execution via email to the email address I last provided but may also request a printed copy by calling the office of [facility].    I understand that my insurance will be billed for this visit.   I have read or had this consent read to me. . I  understand the contents of this consent, which adequately explains the benefits and risks of the Services being provided via telemedicine.  . I have been provided ample opportunity to ask questions regarding this consent and the Services and have had my questions answered to my satisfaction. . I give my informed consent for the services to be provided through the use of telemedicine in my medical care  By participating in this telemedicine visit I agree to the above.

## 2018-05-24 DIAGNOSIS — R188 Other ascites: Secondary | ICD-10-CM | POA: Diagnosis not present

## 2018-05-24 DIAGNOSIS — K7460 Unspecified cirrhosis of liver: Secondary | ICD-10-CM | POA: Diagnosis not present

## 2018-06-08 ENCOUNTER — Telehealth: Payer: 59 | Admitting: Nurse Practitioner

## 2018-06-09 ENCOUNTER — Telehealth: Payer: 59 | Admitting: Internal Medicine

## 2018-06-09 ENCOUNTER — Other Ambulatory Visit: Payer: Self-pay

## 2018-06-09 NOTE — Progress Notes (Signed)
Erroneous encounter
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
## 2018-06-12 ENCOUNTER — Encounter: Payer: Self-pay | Admitting: Internal Medicine

## 2018-06-12 ENCOUNTER — Telehealth (INDEPENDENT_AMBULATORY_CARE_PROVIDER_SITE_OTHER): Payer: 59 | Admitting: Internal Medicine

## 2018-06-12 ENCOUNTER — Ambulatory Visit: Admission: RE | Admit: 2018-06-12 | Payer: 59 | Source: Ambulatory Visit

## 2018-06-12 ENCOUNTER — Other Ambulatory Visit: Payer: Self-pay

## 2018-06-12 VITALS — BP 97/50 | HR 65 | Ht 65.0 in | Wt 201.1 lb

## 2018-06-12 DIAGNOSIS — J9611 Chronic respiratory failure with hypoxia: Secondary | ICD-10-CM | POA: Insufficient documentation

## 2018-06-12 DIAGNOSIS — K7460 Unspecified cirrhosis of liver: Secondary | ICD-10-CM | POA: Insufficient documentation

## 2018-06-12 DIAGNOSIS — I059 Rheumatic mitral valve disease, unspecified: Secondary | ICD-10-CM

## 2018-06-12 DIAGNOSIS — I480 Paroxysmal atrial fibrillation: Secondary | ICD-10-CM

## 2018-06-12 DIAGNOSIS — I5022 Chronic systolic (congestive) heart failure: Secondary | ICD-10-CM

## 2018-06-12 DIAGNOSIS — E039 Hypothyroidism, unspecified: Secondary | ICD-10-CM

## 2018-06-12 DIAGNOSIS — R188 Other ascites: Secondary | ICD-10-CM

## 2018-06-12 DIAGNOSIS — E782 Mixed hyperlipidemia: Secondary | ICD-10-CM

## 2018-06-12 MED ORDER — FUROSEMIDE 40 MG PO TABS: 60.0000 mg | ORAL_TABLET | Freq: Two times a day (BID) | ORAL | Status: DC

## 2018-06-12 NOTE — Patient Instructions (Addendum)
Medication Instructions:  - Your physician has recommended you make the following change in your medication:   1) Increase lasix (furosemide) 40 mg- take 1 & 1/2 tablets (60 mg) by mouth twice daily  If you need a refill on your cardiac medications before your next appointment, please call your pharmacy.   Lab work: - Your physician recommends that you return for FASTING lab work on: Tuesday 06/20/18 (around 9:30 am)- lipid / cmet/ magnesium/ tsh- [place] at [facility]- 1st desk on the right to check in  If you have labs (blood work) drawn today and your tests are completely normal, you will receive your results only by: Marland Kitchen MyChart Message (if you have MyChart) OR . A paper copy in the mail If you have any lab test that is abnormal or we need to change your treatment, we will call you to review the results.  Testing/Procedures: - Your physician has requested that you have an echocardiogram on Tuesday 06/20/18 @ 10:30 am in our office- [facility] [address]. Echocardiography is a painless test that uses sound waves to create images of your heart. It provides your doctor with information about the size and shape of your heart and how well your heart's chambers and valves are working. This procedure takes approximately one hour. There are no restrictions for this procedure.   Follow-Up: At [facility], you and your health needs are our priority.  As part of our continuing mission to provide you with exceptional heart care, we have created designated Provider Care Teams.  These Care Teams include your primary Cardiologist (physician) and Advanced Practice Providers (APPs -  Physician Assistants and Nurse Practitioners) who all work together to provide you with the care you need, when you need it. . on Tuesday 06/20/18 @ 11:30 am with Dr. Saunders Revel.   Any Other Special Instructions Will Be Listed Below (If Applicable). - N/A

## 2018-06-14 NOTE — Progress Notes (Signed)
Follow-up Outpatient Visit Date: 06/20/2018  Primary Care Provider: Ezequiel Kayser, MD [place] [address]  Chief Complaint: Dyspnea on exertion  HPI:  [name] is a 58 y.o. year-old female with history of systolic heart failure diagnosed in 03/2018, paroxysmal atrial fibrillation, cirrhosis with ascites requiring paracentesis, bipolar disorder, obstructive sleep apnea, and polysubstance abuse, who presents for follow-up of heart failure.  We spoke last week, at which time [name] reported an 8 pound weight increase over the preceding 6 weeks.  This was associated with worsening abdominal distention.  She also noted hypoxia with ambulation; O2 saturations reportedly dropped as low as 83%.  We agreed to increase furosemide to 60 mg BID.  She presents today for clinical follow-up, as well as echo and labs.  Since last week, [name] reports having gained 2 more pounds despite escalation of furosemide.  She feels like she is retaining fluid in her abdomen and is concerned about recurrent ascites.  Exertional dyspnea is unchanged.  She notes that her oxygen saturation at rest is typically normal, though she desats to as low as 82% when walking for about 6 minutes around her house.  She has stable orthopnea without PND.  She has not had any chest pain, palpitations, or lightheadedness.  She is scheduled to follow-up with her gastroenterologist tomorrow.  [name] notes a recent gout flare, which is improving after addition of colchicine.  She feels like prednisone has helped her more in the past, though this was not prescribed by Dr. Dorthula Perfect this time.  --------------------------------------------------------------------------------------------------  Cardiovascular History & Procedures: Cardiovascular Problems:  Systolic heart failure (presumed nonischemic with low-risk MPI - 04/2018)  Paroxysmal atrial fibrillation  Risk Factors:  Hyperlipidemia,  obesity, and history of polysubstance abuse  Cath/PCI:  None  CV Surgery:  None  EP Procedures and Devices:  None  Non-Invasive Evaluation(s):  Echocardiogram (06/20/2018): Mildly dilated left ventricle with upper normal wall thickness.  LVEF 55-60% with grade 2 diastolic dysfunction and elevated filling pressure.  Moderately enlarged right ventricle with normal contraction.  Mildly elevated RVSP (35 to 40 mmHg).  Moderate left atrial and mild right atrial enlargement.  Moderate mitral and tricuspid regurgitation.  Pharmacologic MPI (04/28/2018): Small in size, mild in severity, fixed apical inferior defect most likely representing attenuation artifact.  No ischemia.  LVEF 55%.  Echocardiogram (03/28/2010): Normal LV size with LVEF of 30-35%.  Moderately dilated RV with moderately reduced systolic function.  Moderate to severe biatrial enlargement.  Moderate to severe atrial regurgitation.  Severe tricuspid regurgitation.  Recent CV Pertinent Labs: Lab Results  Component Value Date   CHOL 130 06/20/2018   HDL 36 (L) 06/20/2018   LDLCALC 55 06/20/2018   TRIG 196 (H) 06/20/2018   CHOLHDL 3.6 06/20/2018   INR 1.32 03/27/2018   BNP 701.0 (H) 04/03/2018   K 4.2 06/20/2018   MG 2.2 06/20/2018   BUN 32 (H) 06/20/2018   BUN 19 05/02/2018   CREATININE 1.25 (H) 06/20/2018    Recent CV Pertinent Labs: Lab Results  Component Value Date   CHOL 130 06/20/2018   HDL 36 (L) 06/20/2018   LDLCALC 55 06/20/2018   TRIG 196 (H) 06/20/2018   CHOLHDL 3.6 06/20/2018   INR 1.32 03/27/2018   BNP 701.0 (H) 04/03/2018   K 4.2 06/20/2018   MG 2.2 06/20/2018   BUN 32 (H) 06/20/2018   BUN 19 05/02/2018   CREATININE 1.25 (H) 06/20/2018    Past medical  and surgical history were reviewed and updated in EPIC.  Current Meds  Medication Sig   albuterol (PROVENTIL HFA;VENTOLIN HFA) 108 (90 Base) MCG/ACT inhaler Inhale into the lungs every 6 (six) hours as needed for wheezing or shortness of  breath.   albuterol (PROVENTIL) (2.5 MG/3ML) 0.083% nebulizer solution Inhale 3 mLs into the lungs every 6 (six) hours as needed for wheezing.   allopurinol (ZYLOPRIM) 300 MG tablet Take 0.5 tablets (150 mg total) by mouth daily.   amiodarone (PACERONE) 200 MG tablet Take 1 tablet (200 mg total) by mouth daily.   apixaban (ELIQUIS) 5 MG TABS tablet Take 1 tablet (5 mg total) by mouth 2 (two) times daily.   atorvastatin (LIPITOR) 10 MG tablet Take 1 tablet (10 mg total) by mouth daily.   buPROPion (ZYBAN) 150 MG 12 hr tablet Take 150 mg by mouth daily.   citalopram (CELEXA) 20 MG tablet Take 20 mg by mouth daily.   fluticasone (FLONASE) 50 MCG/ACT nasal spray Place 2 sprays into the nose daily.   furosemide (LASIX) 40 MG tablet Take 1 tablet (40 mg total) by mouth 2 (two) times daily.   lactulose (CHRONULAC) 10 GM/15ML solution 60 g 2 (two) times a day.    losartan (COZAAR) 25 MG tablet Take 1 tablet (25 mg total) by mouth daily.   metoprolol succinate (TOPROL-XL) 25 MG 24 hr tablet Take 0.5 tablets (12.5 mg total) by mouth daily.   montelukast (SINGULAIR) 10 MG tablet Take 10 mg by mouth Nightly.   multivitamin-iron-minerals-folic acid (CENTRUM) chewable tablet Chew 1 tablet by mouth daily.   omeprazole (PRILOSEC) 20 MG capsule Take 1 capsule (20 mg total) by mouth daily.   potassium chloride (KLOR-CON) 20 MEQ packet Take 20 mEq by mouth daily.   QUEtiapine (SEROQUEL XR) 200 MG 24 hr tablet Take 200 mg by mouth Nightly.   spironolactone (ALDACTONE) 25 MG tablet Take 1 tablet (25 mg total) by mouth daily.   umeclidinium-vilanterol (ANORO ELLIPTA) 62.5-25 MCG/INH AEPB Inhale 1 puff into the lungs daily.   [DISCONTINUED] furosemide (LASIX) 40 MG tablet Take 1.5 tablets (60 mg total) by mouth 2 (two) times daily.   [DISCONTINUED] metoprolol succinate (TOPROL-XL) 25 MG 24 hr tablet Take 1 tablet (25 mg total) by mouth daily.    Allergies: Doxycycline; Paxil [paroxetine hcl];  Penicillins; and Sulfa antibiotics  Social History   Tobacco Use   Smoking status: Former Smoker    Packs/day: 0.25    Years: 40.00    Pack years: 10.00    Types: Cigarettes    Last attempt to quit: 03/13/2018    Years since quitting: 0.2   Smokeless tobacco: Never Used  Substance Use Topics   Alcohol use: Not Currently    Alcohol/week: 4.0 standard drinks    Types: 4 Cans of beer per week    Comment: stopped 5 weeks ago   Drug use: Not Currently    Types: "Crack" cocaine    Comment: quit 10 years ago    Family History  Problem Relation Age of Onset   Hypertension Mother    Diabetes Mother    Peripheral Artery Disease Mother    Dementia Father    Hypertension Father    Peripheral Artery Disease Sister    Breast cancer Neg Hx     Review of Systems: [name] notes continued constipation despite escalation of lactulose at the direction of Dr. Jacqulyn Liner.  Otherwise, a 12-system review of systems was performed and was negative except as noted  in the HPI.  --------------------------------------------------------------------------------------------------  Physical Exam: BP (!) 94/50 (BP Location: Left Arm, Patient Position: Sitting, Cuff Size: Normal)    Pulse (!) 56    Ht 5\' 5"  (1.651 m)    Wt 203 lb 8 oz (92.3 kg)    SpO2 96%    BMI 33.86 kg/m   General: NAD.  Accompanied by her husband. HEENT: No conjunctival pallor or scleral icterus. Moist mucous membranes.  OP clear. Neck: Supple without lymphadenopathy, thyromegaly, JVD, or HJR.  Lungs: Normal work of breathing. Clear to auscultation bilaterally without wheezes or crackles. Heart: Bradycardic but regular with 1/6 systolic murmur loudest at the left lower sternal border.  No rubs or gallops.  Unable to assess PMI due to body habitus.. Abd: Bowel sounds present.  Soft with mild periumbilical tenderness.  Moderately distended. Ext: No lower extremity edema.  1+ radial and pedal pulses bilaterally. Skin: Warm and  dry without rash.  EKG: Sinus bradycardia with incomplete right bundle branch block and nonspecific ST/T changes.  Prolonged QT (QTc 497 ms).  Lab Results  Component Value Date   WBC 9.1 04/05/2018   HGB 16.4 (H) 04/05/2018   HCT 50.3 (H) 04/05/2018   MCV 97.7 04/05/2018   PLT 179 04/05/2018    Lab Results  Component Value Date   NA 141 06/20/2018   K 4.2 06/20/2018   CL 100 06/20/2018   CO2 28 06/20/2018   BUN 32 (H) 06/20/2018   CREATININE 1.25 (H) 06/20/2018   GLUCOSE 104 (H) 06/20/2018   ALT 29 06/20/2018    Lab Results  Component Value Date   CHOL 130 06/20/2018   HDL 36 (L) 06/20/2018   LDLCALC 55 06/20/2018   TRIG 196 (H) 06/20/2018   CHOLHDL 3.6 06/20/2018    --------------------------------------------------------------------------------------------------  ASSESSMENT AND PLAN: Chronic systolic and diastolic heart failure: Symptoms have improved significantly since hospitalization in February, though [name] reports continued exertional dyspnea and hypoxia with ambulation.  I walked her around the office today, with oxygen saturations never dropping below 96%.  Other than some abdominal distention, [name] appears euvolemic on exam today.  Echocardiogram demonstrates normalized LVEF with evidence of normal RA pressures.  She has evidence of grade 2 diastolic dysfunction, which may be contributing somewhat to her symptoms.  However, underlying lung and liver disease are likely also playing a role in her exertional dyspnea.  Given rising BUN, I think it would be worthwhile to decrease furosemide back to 40 mg twice daily.  I will also decrease metoprolol succinate to 12.5 mg daily given resting bradycardia.  No other medication changes today.  Given normalization of LVEF and no significant ischemia on recent myocardial perfusion stress test, I think it is reasonable to defer catheterization.  Cirrhosis and ascites: I agree with [name] that worsening abdominal  distention and weight gain despite no other evidence of fluid retention is concerning for reaccumulation of ascites.  We will attempt to obtain a limited abdominal ultrasound to evaluate for fluid reaccumulation today.  [name] should follow-up with her gastroenterologist as planned tomorrow.  COPD: Emphysematous changes noted on CT of the chest in February.  Follow-up CT is planned for later this month through Dr. Raul Del.  I will defer ongoing management of underlying lung disease to him.  Persistent atrial fibrillation: [name] is maintaining sinus rhythm.  We will continue amiodarone for now, though I would appreciate Dr. Synetta Fail thoughts regarding long-term amiodarone use in the setting of underlying liver disease.  It may be worthwhile to consider discontinuation of amiodarone, though risk for recurrent atrial fibrillation is certainly high in the setting of moderate left atrial enlargement and at least moderate mitral and tricuspid regurgitation.  Anticoagulation with apixaban will be continued.  Follow-up: Virtual/office visit in 6 weeks.  Nelva Bush, MD 06/20/2018 3:18 PM

## 2018-06-20 ENCOUNTER — Other Ambulatory Visit: Payer: 59

## 2018-06-20 ENCOUNTER — Ambulatory Visit
Admission: RE | Admit: 2018-06-20 | Discharge: 2018-06-20 | Disposition: A | Discharge status: Home / Self Care | Payer: 59 | Source: Ambulatory Visit | Attending: Internal Medicine | Admitting: Internal Medicine

## 2018-06-20 ENCOUNTER — Other Ambulatory Visit
Admission: RE | Admit: 2018-06-20 | Discharge: 2018-06-20 | Disposition: A | Discharge status: Home / Self Care | Payer: 59 | Source: Ambulatory Visit | Attending: Internal Medicine | Admitting: Internal Medicine

## 2018-06-20 ENCOUNTER — Ambulatory Visit (INDEPENDENT_AMBULATORY_CARE_PROVIDER_SITE_OTHER): Payer: 59 | Admitting: Internal Medicine

## 2018-06-20 ENCOUNTER — Ambulatory Visit (INDEPENDENT_AMBULATORY_CARE_PROVIDER_SITE_OTHER): Payer: 59

## 2018-06-20 ENCOUNTER — Encounter: Payer: Self-pay | Admitting: Internal Medicine

## 2018-06-20 ENCOUNTER — Other Ambulatory Visit: Payer: Self-pay

## 2018-06-20 VITALS — BP 94/50 | HR 56 | Ht 65.0 in | Wt 203.5 lb

## 2018-06-20 DIAGNOSIS — I5022 Chronic systolic (congestive) heart failure: Secondary | ICD-10-CM

## 2018-06-20 DIAGNOSIS — R188 Other ascites: Secondary | ICD-10-CM

## 2018-06-20 DIAGNOSIS — Z8719 Personal history of other diseases of the digestive system: Secondary | ICD-10-CM | POA: Diagnosis not present

## 2018-06-20 DIAGNOSIS — I4819 Other persistent atrial fibrillation: Secondary | ICD-10-CM

## 2018-06-20 DIAGNOSIS — E039 Hypothyroidism, unspecified: Secondary | ICD-10-CM

## 2018-06-20 DIAGNOSIS — K7460 Unspecified cirrhosis of liver: Secondary | ICD-10-CM | POA: Diagnosis not present

## 2018-06-20 DIAGNOSIS — I059 Rheumatic mitral valve disease, unspecified: Secondary | ICD-10-CM | POA: Diagnosis not present

## 2018-06-20 DIAGNOSIS — E782 Mixed hyperlipidemia: Secondary | ICD-10-CM | POA: Insufficient documentation

## 2018-06-20 DIAGNOSIS — I5042 Chronic combined systolic (congestive) and diastolic (congestive) heart failure: Secondary | ICD-10-CM | POA: Diagnosis not present

## 2018-06-20 DIAGNOSIS — I480 Paroxysmal atrial fibrillation: Secondary | ICD-10-CM | POA: Insufficient documentation

## 2018-06-20 DIAGNOSIS — J449 Chronic obstructive pulmonary disease, unspecified: Secondary | ICD-10-CM | POA: Diagnosis not present

## 2018-06-20 LAB — COMPREHENSIVE METABOLIC PANEL
ALT: 29 U/L (ref 0–44)
AST: 29 U/L (ref 15–41)
Albumin: 4.5 g/dL (ref 3.5–5.0)
Alkaline Phosphatase: 135 U/L — ABNORMAL HIGH (ref 38–126)
Anion gap: 13 (ref 5–15)
BUN: 32 mg/dL — ABNORMAL HIGH (ref 6–20)
CO2: 28 mmol/L (ref 22–32)
Calcium: 9.5 mg/dL (ref 8.9–10.3)
Chloride: 100 mmol/L (ref 98–111)
Creatinine, Ser: 1.25 mg/dL — ABNORMAL HIGH (ref 0.44–1.00)
GFR calc Af Amer: 55 mL/min — ABNORMAL LOW (ref >60)
GFR calc non Af Amer: 47 mL/min — ABNORMAL LOW (ref >60)
Glucose, Bld: 104 mg/dL — ABNORMAL HIGH (ref 70–99)
Potassium: 4.2 mmol/L (ref 3.5–5.1)
Sodium: 141 mmol/L (ref 135–145)
Total Bilirubin: 1.1 mg/dL (ref 0.3–1.2)
Total Protein: 7.9 g/dL (ref 6.5–8.1)

## 2018-06-20 LAB — LIPID PANEL
Cholesterol: 130 mg/dL (ref 0–200)
HDL: 36 mg/dL — ABNORMAL LOW (ref >40)
LDL Cholesterol: 55 mg/dL (ref 0–99)
Total CHOL/HDL Ratio: 3.6 RATIO
Triglycerides: 196 mg/dL — ABNORMAL HIGH (ref <150)
VLDL: 39 mg/dL (ref 0–40)

## 2018-06-20 LAB — MAGNESIUM: Magnesium: 2.2 mg/dL (ref 1.7–2.4)

## 2018-06-20 LAB — TSH: TSH: 6.67 u[IU]/mL — ABNORMAL HIGH (ref 0.350–4.500)

## 2018-06-20 MED ORDER — METOPROLOL SUCCINATE ER 25 MG PO TB24: 12.5000 mg | ORAL_TABLET | Freq: Every day | ORAL | 3 refills | Status: DC

## 2018-06-20 MED ORDER — FUROSEMIDE 40 MG PO TABS: 40.0000 mg | ORAL_TABLET | Freq: Two times a day (BID) | ORAL | 3 refills | Status: DC

## 2018-06-20 NOTE — Patient Instructions (Addendum)
Medication Instructions:  Your physician has recommended you make the following change in your medication:  1. DECREASE Metoprolol succinate 25 mg down to 1/2 tablet (12.5 mg) once daily 2. DECREASE Furosemide 40 mg twice a day   If you need a refill on your cardiac medications before your next appointment, please call your pharmacy.   Lab work: None at this time. If you have labs (blood work) drawn today and your tests are completely normal, you will receive your results only by: Marland Kitchen MyChart Message (if you have MyChart) OR . A paper copy in the mail If you have any lab test that is abnormal or we need to change your treatment, we will call you to review the results.  Testing/Procedures: Limited abdominal ultrasound for ascites. NOTHING TO EAT OR DRINK UNTIL AFTER TEST Please arrive at 3:45 pm at the [facility] located directly across from [place]: At [facility], you and your health needs are our priority.  As part of our continuing mission to provide you with exceptional heart care, we have created designated Provider Care Teams.  These Care Teams include your primary Cardiologist (physician) and Advanced Practice Providers (APPs -  Physician Assistants and Nurse Practitioners) who all work together to provide you with the care you need, when you need it. You will need a follow up appointment in 6 weeks.  Please call our office 2 months in advance to schedule this appointment.  You may see Nelva Bush, MD or one of the following Advanced Practice Providers on your designated Care Team:   This may also be a virtual visit either by telephone or video. Consent below for your review.  Murray Hodgkins, NP Christell Faith, PA-C . Marrianne Mood, PA-C  Any Other Special Instructions Will Be Listed Below (If Applicable). YOUR CARDIOLOGY TEAM HAS ARRANGED FOR AN E-VISIT FOR YOUR APPOINTMENT - PLEASE REVIEW IMPORTANT INFORMATION BELOW SEVERAL DAYS PRIOR TO YOUR  APPOINTMENT  Due to the recent COVID-19 pandemic, we are transitioning in-person office visits to tele-medicine visits in an effort to decrease unnecessary exposure to our patients, their families, and staff. These visits are billed to your insurance just like a normal visit is. We also encourage you to sign up for MyChart if you have not already done so. You will need a smartphone if possible. For patients that do not have this, we can still complete the visit using a regular telephone but do prefer a smartphone to enable video when possible. You may have a family member that lives with you that can help. If possible, we also ask that you have a blood pressure cuff and scale at home to measure your blood pressure, heart rate and weight prior to your scheduled appointment. Patients with clinical needs that need an in-person evaluation and testing will still be able to come to the office if absolutely necessary. If you have any questions, feel free to call our office.  . IF USING DOXIMITY or [email or web address] - The staff will give you instructions on receiving your link to join the meeting the day of your visit.  Marland Kitchen You will receive a text message around your appointment time with a link. . Once you click that link you will enter your name and then select check in. . Finally, though the technology is pretty good, we cannot assure that it will always work on either your or our end, and in the setting Finally, though the technology is pretty good, we cannot assure  that it will always work on either your or our end, and in the setting of a video visit, we may have to convert it to a phone-only visit.   2-3 DAYS BEFORE YOUR APPOINTMENT  You will receive a telephone call from one of our [facility] team members - your caller ID may say "Unknown caller." If this is a video visit, we will walk you through how to get the video launched on your phone. We will remind you check your blood pressure, heart rate and weight prior  to your scheduled appointment. If you have an Apple Watch or Kardia, please upload any pertinent ECG strips the day before or morning of your appointment to [place]. Our staff will also make sure you have reviewed the consent and agree to move forward with your scheduled tele-health visit.     THE DAY OF YOUR APPOINTMENT  Approximately 15 minutes prior to your scheduled appointment, you will receive a telephone call from one of [facility] team - your caller ID may say "Unknown caller."  Our staff will confirm medications, vital signs for the day and any symptoms you may be experiencing. Please have this information available prior to the time of visit start. It may also be helpful for you to have a pad of paper and pen handy for any instructions given during your visit. They will also walk you through joining the smartphone meeting if this is a video visit.    CONSENT FOR TELE-HEALTH VISIT - PLEASE REVIEW  I hereby voluntarily request, consent and authorize [facility] and its employed or contracted physicians, physician assistants, nurse practitioners or other licensed health care professionals (the Practitioner), to provide me with telemedicine health care services (the "Services") as deemed necessary by the treating Practitioner. I acknowledge and consent to receive the Services by the Practitioner via telemedicine. I understand that the telemedicine visit will involve communicating with the Practitioner through live audiovisual communication technology and the disclosure of certain medical information by electronic transmission. I acknowledge that I have been given the opportunity to request an in-person assessment or other available alternative prior to the telemedicine visit and am voluntarily participating in the telemedicine visit.  I understand that I have the right to withhold or withdraw my consent to the use of telemedicine in the course of my care at any time, without affecting my right  to future care or treatment, and that the Practitioner or I may terminate the telemedicine visit at any time. I understand that I have the right to inspect all information obtained and/or recorded in the course of the telemedicine visit and may receive copies of available information for a reasonable fee.  I understand that some of the potential risks of receiving the Services via telemedicine include:  Marland Kitchen Delay or interruption in medical evaluation due to technological equipment failure or disruption; . Information transmitted may not be sufficient (e.g. poor resolution of images) to allow for appropriate medical decision making by the Practitioner; and/or  . In rare instances, security protocols could fail, causing a breach of personal health information.  Furthermore, I acknowledge that it is my responsibility to provide information about my medical history, conditions and care that is complete and accurate to the best of my ability. I acknowledge that Practitioner's advice, recommendations, and/or decision may be based on factors not within their control, such as incomplete or inaccurate data provided by me or distortions of diagnostic images or specimens that may result from electronic transmissions. I understand that the  practice of medicine is not an Chief Strategy Officer and that Practitioner makes no warranties or guarantees regarding treatment outcomes. I acknowledge that I will receive a copy of this consent concurrently upon execution via email to the email address I last provided but may also request a printed copy by calling the office of [facility].    I understand that my insurance will be billed for this visit.   I have read or had this consent read to me. . I understand the contents of this consent, which adequately explains the benefits and risks of the Services being provided via telemedicine.  . I have been provided ample opportunity to ask questions regarding this consent and the Services  and have had my questions answered to my satisfaction. . I give my informed consent for the services to be provided through the use of telemedicine in my medical care  By participating in this telemedicine visit I agree to the above.    Ascites  Ascites is a collection of too much fluid in the abdomen. Ascites can range from mild to severe. If ascites is not treated, it can get worse. What are the causes? This condition may be caused by:  A liver condition called cirrhosis. This is the most common cause of ascites.  Long-term (chronic) or alcoholic hepatitis.  Infection or inflammation in the abdomen.  Cancer in the abdomen.  Heart failure.  Kidney disease.  Inflammation of the pancreas.  Clots in the veins of the liver. What are the signs or symptoms? Symptoms of this condition include:  A feeling of fullness in the abdomen. This is common.  An increase in the size of the abdomen or waist.  Swelling in the legs.  Swelling of the scrotum (in men).  Difficulty breathing.  Pain in the abdomen.  Sudden weight gain. If the condition is mild, you may not have symptoms. How is this diagnosed? This condition is diagnosed based on your medical history and a physical exam. Your health care provider may order imaging tests, such as an ultrasound or CT scan of your abdomen. How is this treated? Treatment for this condition depends on the cause of the ascites. It may include:  Taking a pill to make you urinate. This is called a water pill (diuretic pill).  Strictly reducing your salt (sodium) intake. Salt can cause extra fluid to be kept (retained) in the body, and this makes ascites worse.  Having a procedure to remove fluid from your abdomen (paracentesis).  Having a procedure that connects two of the major veins within your liver and relieves pressure on your liver. This is called a TIPS procedure (transjugular intrahepatic portosystemic shunt procedure).  Placement  of a drainage catheter (peritoneovenous shunt) to manage the extra fluid in the abdomen. Ascites may go away or improve when the condition that caused it is treated. Follow these instructions at home:  Keep track of your weight. To do this, weigh yourself at the same time every day and write down your weight.  Keep track of how much you drink and any changes in how much or how often you urinate.  Follow any instructions that your health care provider gives you about how much to drink.  Try not to eat salty (high-sodium) foods.  Take over-the-counter and prescription medicines only as told by your health care provider.  Keep all follow-up visits as told by your health care provider. This is important.  Report any changes in your health to your health care  provider, especially if you develop new symptoms or your symptoms get worse. Contact a health care provider if:  You gain more than 3 lb (1.36 kg) in 3 days.  Your waist size increases.  You have new swelling in your legs.  The swelling in your legs gets worse. Get help right away if:  You have a fever.  You are confused.  You have new or worsening breathing trouble.  You have new or worsening pain in your abdomen.  You have new or worsening swelling in the scrotum (in men). Summary  Ascites is a collection of too much fluid in the abdomen.  Ascites may be caused by various conditions, such as cirrhosis, hepatitis, cancer, or congestive heart failure.  Symptoms may include swelling of the abdomen and other areas due to extra fluid in the body.  Treatments may involve dietary changes, medicines, or procedures. This information is not intended to replace advice given to you by your health care provider. Make sure you discuss any questions you have with your health care provider. Document Released: 02/01/2005 Document Revised: 10/14/2016 Document Reviewed: 10/14/2016 Elsevier Interactive Patient Education  2019 [employer].  Abdominal or Pelvic Ultrasound An ultrasound is a test that uses sound waves to take pictures of the inside of the body. An abdominal ultrasound takes pictures of the inside of your belly (abdomen). A pelvic ultrasound takes pictures of the inside of the area between your hip bones (pelvis). An ultrasound may be done to check an organ or look for problems. This is a safe test that does not hurt. It is done by placing a handheld device called a transducer on the outside of your belly or pelvis and moving it around. Tell your doctor about:  Any allergies you have.  All medicines you are taking.  Any surgeries you have had.  Any medical conditions you have.  Whether you are pregnant or may be pregnant. What are the risks? There are no known risks from having this test. What happens before the procedure?  Follow instructions from your doctor about eating or drinking before the test.  Wear clothing that is easy to wash. Gel from the test might get on your clothes. What happens during the procedure?   You will lie on an exam table.  Your clothes will be moved so your belly and pelvis are showing.  A gel will be put on your skin. It may feel cool.  The transducer device will be put on your skin. It will be moved back and forth over the area being looked at.  The device will take pictures. They will show on small TV screens.  You may be asked to change your position.  After the exam, the gel will be cleaned off. What happens after the procedure?  It is up to you to get the results of your test. Ask your doctor, or the department that is doing the test, when your results will be ready.  Keep all follow-up visits as told by your doctor. This is important. Summary  An ultrasound is a test that uses sound waves to take pictures of the inside of the body.  An ultrasound may be done to check an organ or look for problems.  The test is done by moving a handheld device around  on the outside of your belly or pelvis.  It is up to you to get the results of your test. Be sure to ask when your results will be ready. This  information is not intended to replace advice given to you by your health care provider. Make sure you discuss any questions you have with your health care provider. Document Released: 03/06/2010 Document Revised: 08/29/2017 Document Reviewed: 08/29/2017 Elsevier Interactive Patient Education  [employer].

## 2018-06-28 ENCOUNTER — Other Ambulatory Visit: Payer: Self-pay | Admitting: Gastroenterology

## 2018-06-28 ENCOUNTER — Other Ambulatory Visit (HOSPITAL_COMMUNITY): Payer: Self-pay | Admitting: Gastroenterology

## 2018-06-28 DIAGNOSIS — Z8371 Family history of colonic polyps: Secondary | ICD-10-CM | POA: Diagnosis not present

## 2018-06-28 DIAGNOSIS — K7460 Unspecified cirrhosis of liver: Secondary | ICD-10-CM

## 2018-07-03 DIAGNOSIS — N183 Chronic kidney disease, stage 3 unspecified: Secondary | ICD-10-CM | POA: Insufficient documentation

## 2018-07-07 ENCOUNTER — Ambulatory Visit
Admission: RE | Admit: 2018-07-07 | Discharge: 2018-07-07 | Disposition: A | Discharge status: Home / Self Care | Payer: 59 | Source: Ambulatory Visit | Attending: Gastroenterology | Admitting: Gastroenterology

## 2018-07-07 ENCOUNTER — Other Ambulatory Visit: Payer: Self-pay

## 2018-07-07 DIAGNOSIS — K7460 Unspecified cirrhosis of liver: Secondary | ICD-10-CM

## 2018-07-11 ENCOUNTER — Ambulatory Visit
Admission: RE | Admit: 2018-07-11 | Discharge: 2018-07-11 | Disposition: A | Discharge status: Home / Self Care | Payer: 59 | Source: Ambulatory Visit | Attending: Specialist | Admitting: Specialist

## 2018-07-11 ENCOUNTER — Other Ambulatory Visit: Payer: Self-pay

## 2018-07-11 DIAGNOSIS — J849 Interstitial pulmonary disease, unspecified: Secondary | ICD-10-CM | POA: Diagnosis not present

## 2018-07-24 ENCOUNTER — Encounter: Payer: Self-pay | Admitting: Family

## 2018-07-24 ENCOUNTER — Other Ambulatory Visit: Payer: Self-pay

## 2018-07-24 ENCOUNTER — Ambulatory Visit: Payer: 59 | Attending: Family | Admitting: Family

## 2018-07-24 VITALS — BP 108/51 | HR 72 | Temp 98.8°F | Resp 18 | Ht 65.0 in | Wt 210.5 lb

## 2018-07-24 DIAGNOSIS — E785 Hyperlipidemia, unspecified: Secondary | ICD-10-CM | POA: Diagnosis not present

## 2018-07-24 DIAGNOSIS — Z87891 Personal history of nicotine dependence: Secondary | ICD-10-CM | POA: Diagnosis not present

## 2018-07-24 DIAGNOSIS — Z79899 Other long term (current) drug therapy: Secondary | ICD-10-CM | POA: Diagnosis not present

## 2018-07-24 DIAGNOSIS — J9611 Chronic respiratory failure with hypoxia: Secondary | ICD-10-CM

## 2018-07-24 DIAGNOSIS — M109 Gout, unspecified: Secondary | ICD-10-CM | POA: Insufficient documentation

## 2018-07-24 DIAGNOSIS — R5383 Other fatigue: Secondary | ICD-10-CM | POA: Insufficient documentation

## 2018-07-24 DIAGNOSIS — G4733 Obstructive sleep apnea (adult) (pediatric): Secondary | ICD-10-CM

## 2018-07-24 DIAGNOSIS — I4891 Unspecified atrial fibrillation: Secondary | ICD-10-CM | POA: Diagnosis not present

## 2018-07-24 DIAGNOSIS — F319 Bipolar disorder, unspecified: Secondary | ICD-10-CM | POA: Insufficient documentation

## 2018-07-24 DIAGNOSIS — I959 Hypotension, unspecified: Secondary | ICD-10-CM | POA: Insufficient documentation

## 2018-07-24 DIAGNOSIS — I429 Cardiomyopathy, unspecified: Secondary | ICD-10-CM | POA: Diagnosis not present

## 2018-07-24 DIAGNOSIS — J449 Chronic obstructive pulmonary disease, unspecified: Secondary | ICD-10-CM | POA: Insufficient documentation

## 2018-07-24 DIAGNOSIS — R140 Abdominal distension (gaseous): Secondary | ICD-10-CM | POA: Insufficient documentation

## 2018-07-24 DIAGNOSIS — Z7901 Long term (current) use of anticoagulants: Secondary | ICD-10-CM | POA: Diagnosis not present

## 2018-07-24 DIAGNOSIS — Z8249 Family history of ischemic heart disease and other diseases of the circulatory system: Secondary | ICD-10-CM | POA: Diagnosis not present

## 2018-07-24 DIAGNOSIS — I5032 Chronic diastolic (congestive) heart failure: Secondary | ICD-10-CM | POA: Insufficient documentation

## 2018-07-24 DIAGNOSIS — K7460 Unspecified cirrhosis of liver: Secondary | ICD-10-CM | POA: Diagnosis not present

## 2018-07-24 DIAGNOSIS — K219 Gastro-esophageal reflux disease without esophagitis: Secondary | ICD-10-CM | POA: Diagnosis not present

## 2018-07-24 DIAGNOSIS — R188 Other ascites: Secondary | ICD-10-CM

## 2018-07-24 NOTE — Patient Instructions (Signed)
Continue weighing daily and call for an overnight weight gain of > 2 pounds or a weekly weight gain of >5 pounds. 
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
## 2018-08-02 ENCOUNTER — Other Ambulatory Visit: Payer: Self-pay | Admitting: Internal Medicine

## 2018-08-02 DIAGNOSIS — Z1231 Encounter for screening mammogram for malignant neoplasm of breast: Secondary | ICD-10-CM

## 2018-08-03 ENCOUNTER — Telehealth: Payer: Self-pay

## 2018-08-03 NOTE — Telephone Encounter (Signed)

## 2018-08-04 ENCOUNTER — Ambulatory Visit (INDEPENDENT_AMBULATORY_CARE_PROVIDER_SITE_OTHER): Payer: 59 | Admitting: Internal Medicine

## 2018-08-04 ENCOUNTER — Other Ambulatory Visit: Payer: Self-pay

## 2018-08-04 ENCOUNTER — Encounter: Payer: Self-pay | Admitting: Internal Medicine

## 2018-08-04 VITALS — BP 106/60 | HR 69 | Ht 65.0 in | Wt 212.8 lb

## 2018-08-04 DIAGNOSIS — I5042 Chronic combined systolic (congestive) and diastolic (congestive) heart failure: Secondary | ICD-10-CM

## 2018-08-04 DIAGNOSIS — R0789 Other chest pain: Secondary | ICD-10-CM | POA: Diagnosis not present

## 2018-08-04 DIAGNOSIS — J449 Chronic obstructive pulmonary disease, unspecified: Secondary | ICD-10-CM

## 2018-08-04 DIAGNOSIS — I4819 Other persistent atrial fibrillation: Secondary | ICD-10-CM | POA: Diagnosis not present

## 2018-08-04 NOTE — Patient Instructions (Signed)
Medication Instructions:  Your physician recommends that you continue on your current medications as directed. Please refer to the Current Medication list given to you today.  If you need a refill on your cardiac medications before your next appointment, please call your pharmacy.   Lab work: - None ordered.  If you have labs (blood work) drawn today and your tests are completely normal, you will receive your results only by: Marland Kitchen MyChart Message (if you have MyChart) OR . A paper copy in the mail If you have any lab test that is abnormal or we need to change your treatment, we will call you to review the results.  Testing/Procedures: - None ordered.   Follow-Up: At [facility], you and your health needs are our priority.  As part of our continuing mission to provide you with exceptional heart care, we have created designated Provider Care Teams.  These Care Teams include your primary Cardiologist (physician) and Advanced Practice Providers (APPs -  Physician Assistants and Nurse Practitioners) who all work together to provide you with the care you need, when you need it. You will need a follow up appointment in 3 months.  Please call our office 2 months in advance to schedule this appointment.  You may see Nelva Bush, MD or one of the following Advanced Practice Providers on your designated Care Team:   Murray Hodgkins, NP Christell Faith, PA-C . Marrianne Mood, PA-C   You have been referred to Electrophysiology cardiologist - Dr Caryl Comes here in [place].

## 2018-08-04 NOTE — Progress Notes (Signed)
Follow-up Outpatient Visit Date: 08/04/2018  Primary Care Provider: Ezequiel Kayser, MD [facility] [place] [address]  Chief Complaint: F/u cardiomyopathy and a-fib  HPI:  Ms. Cervantes is a 58 y.o. year-old female with history of systolic heart failure diagnosed in 03/2018 due to presumed tachycardia mediated cardiomyopathy in the setting of atrial fibrillation with rapid ventricular response, PAF, cirrhosis, bipolar disorder, obstructive sleep apnea, and polysubstance abuse, who presents for follow-up of dyspnea on exertion.  I last saw Ms. Gosch in early May, at which time she complained of weight gain despite escalation of furosemide.  She had stable exertional dyspnea but was concerned about worsening abdominal distention and possible ascites.  She also noted hypoxia when walking at home with oxygen saturations as low as 82%.  Echocardiogram at that visit showed LVEF of 55 to 60% with mild pulmonary hypertension and moderate mitral and tricuspid regurgitation.  Abdominal ultrasound did not show any ascites.  She was subsequently seen by GI (Ms. Jacqulyn Liner) with suspicion for primary biliary cirrhosis.  Though amiodarone was not felt to be absolutely contraindicated, transition to an alternative agent, if possible, was suggested.  Today, Ms. Heick reports that she feels relatively well.  She has stable exertional dyspnea and still feels like her abdomen is distended.  Her weight continues to creep up, though she wonders if some of this is related to less activity.  She is trying to walk more.  She notes rare episodes of brief left-sided chest pressure that is fleeting and lasts a second or two.  There are no associated symptoms.  It is not exertional.  She has not had any significant edema.  She has stable orthopnea, sleeping on an adjustable bed.  She is compliant with CPAP and her medications.  She has not had any significant bleeding, remaining on apixaban.  She recently had  a 6-minute walk test with Dr. Raul Del and notes that her oxygen saturation only dropped to 91%.  Her heart rate increased from around 60 bpm to 100 bpm while walking.  --------------------------------------------------------------------------------------------------  Cardiovascular History & Procedures: Cardiovascular Problems:  Systolic heart failure (presumed nonischemic with low-risk MPI - 04/2018)  Paroxysmal atrial fibrillation  Risk Factors:  Hyperlipidemia, obesity, and history of polysubstance abuse  Cath/PCI:  None  CV Surgery:  None  EP Procedures and Devices:  None  Non-Invasive Evaluation(s):  Echocardiogram (06/20/2018): Mildly dilated left ventricle with upper normal wall thickness.  LVEF 55-60% with grade 2 diastolic dysfunction and elevated filling pressure.  Moderately enlarged right ventricle with normal contraction.  Mildly elevated RVSP (35 to 40 mmHg).  Moderate left atrial and mild right atrial enlargement.  Moderate mitral and tricuspid regurgitation.  Pharmacologic MPI (04/28/2018): Small in size, mild in severity, fixed apical inferior defect most likely representing attenuation artifact. No ischemia. LVEF 55%.  Echocardiogram (03/28/2010): Normal LV size with LVEF of 30-35%. Moderately dilated RV with moderately reduced systolic function. Moderate to severe biatrial enlargement. Moderate to severe atrial regurgitation. Severe tricuspid regurgitation.  Recent CV Pertinent Labs: Lab Results  Component Value Date   CHOL 130 06/20/2018   HDL 36 (L) 06/20/2018   LDLCALC 55 06/20/2018   TRIG 196 (H) 06/20/2018   CHOLHDL 3.6 06/20/2018   INR 1.32 03/27/2018   BNP 701.0 (H) 04/03/2018   K 4.2 06/20/2018   MG 2.2 06/20/2018   BUN 32 (H) 06/20/2018   BUN 19 05/02/2018   CREATININE 1.25 (H) 06/20/2018    Past medical and surgical  history were reviewed and updated in EPIC.  Current Meds  Medication Sig  . albuterol (PROVENTIL HFA;VENTOLIN  HFA) 108 (90 Base) MCG/ACT inhaler Inhale into the lungs every 6 (six) hours as needed for wheezing or shortness of breath.  Marland Kitchen albuterol (PROVENTIL) (2.5 MG/3ML) 0.083% nebulizer solution Inhale 3 mLs into the lungs every 6 (six) hours as needed for wheezing.  Marland Kitchen allopurinol (ZYLOPRIM) 300 MG tablet Take 0.5 tablets (150 mg total) by mouth daily. (Patient taking differently: Take 300 mg by mouth daily. )  . amiodarone (PACERONE) 200 MG tablet Take 1 tablet (200 mg total) by mouth daily.  Marland Kitchen apixaban (ELIQUIS) 5 MG TABS tablet Take 1 tablet (5 mg total) by mouth 2 (two) times daily.  Marland Kitchen atorvastatin (LIPITOR) 10 MG tablet Take 1 tablet (10 mg total) by mouth daily.  Marland Kitchen buPROPion (ZYBAN) 150 MG 12 hr tablet Take 150 mg by mouth daily.  . citalopram (CELEXA) 20 MG tablet Take 20 mg by mouth daily.  . fluticasone (FLONASE) 50 MCG/ACT nasal spray Place 2 sprays into the nose daily.  . furosemide (LASIX) 40 MG tablet Take 1 tablet (40 mg total) by mouth 2 (two) times daily.  Marland Kitchen lactulose (CHRONULAC) 10 GM/15ML solution 60 g 2 (two) times a day.   . lansoprazole (PREVACID) 30 MG capsule Take 30 mg by mouth daily at 12 noon.  Marland Kitchen losartan (COZAAR) 25 MG tablet Take 1 tablet (25 mg total) by mouth daily.  . metoprolol succinate (TOPROL-XL) 25 MG 24 hr tablet Take 0.5 tablets (12.5 mg total) by mouth daily.  . montelukast (SINGULAIR) 10 MG tablet Take 10 mg by mouth Nightly.  . multivitamin-iron-minerals-folic acid (CENTRUM) chewable tablet Chew 1 tablet by mouth daily.  . potassium chloride (KLOR-CON) 20 MEQ packet Take 20 mEq by mouth daily.  . QUEtiapine (SEROQUEL XR) 200 MG 24 hr tablet Take 200 mg by mouth Nightly.  Marland Kitchen spironolactone (ALDACTONE) 25 MG tablet Take 1 tablet (25 mg total) by mouth daily.  Marland Kitchen umeclidinium-vilanterol (ANORO ELLIPTA) 62.5-25 MCG/INH AEPB Inhale 1 puff into the lungs daily.    Allergies: Doxycycline, Paxil [paroxetine hcl], Penicillins, and Sulfa antibiotics  Social History    Tobacco Use  . Smoking status: Former Smoker    Packs/day: 0.25    Years: 40.00    Pack years: 10.00    Types: Cigarettes    Quit date: 03/13/2018    Years since quitting: 0.3  . Smokeless tobacco: Never Used  Substance Use Topics  . Alcohol use: Not Currently    Alcohol/week: 4.0 standard drinks    Types: 4 Cans of beer per week    Comment: stopped 5 weeks ago  . Drug use: Not Currently    Types: "Crack" cocaine    Comment: quit 10 years ago    Family History  Problem Relation Age of Onset  . Hypertension Mother   . Diabetes Mother   . Peripheral Artery Disease Mother   . Dementia Father   . Hypertension Father   . Peripheral Artery Disease Sister   . Breast cancer Neg Hx     Review of Systems: A 12-system review of systems was performed and was negative except as noted in the HPI.  --------------------------------------------------------------------------------------------------  Physical Exam: BP 106/60 (BP Location: Left Arm, Patient Position: Sitting, Cuff Size: Normal)   Pulse 69   Ht 5\' 5"  (1.651 m)   Wt 212 lb 12 oz (96.5 kg)   BMI 35.40 kg/m   General: NAD. HEENT: No  conjunctival pallor or scleral icterus. Moist mucous membranes.  OP clear. Neck: Supple without lymphadenopathy, thyromegaly, JVD, or HJR. Lungs: Normal work of breathing. Clear to auscultation bilaterally without wheezes or crackles. Heart: Regular rate and rhythm with 1/6 holosystolic murmur.  Unable to assess PMI due to body habitus. Abd: Bowel sounds present.  With mild right upper quadrant tenderness.  No rebound or guarding.  Unable to assess HSM due to body habitus. Ext: No lower extremity edema. Radial, PT, and DP pulses are 2+ bilaterally. Skin: Warm and dry without rash.  EKG: Normal sinus rhythm with lateral T wave inversions and nonspecific ST segment changes.  No significant change from prior tracing on 06/20/2018.  Lab Results  Component Value Date   WBC 9.1 04/05/2018   HGB  16.4 (H) 04/05/2018   HCT 50.3 (H) 04/05/2018   MCV 97.7 04/05/2018   PLT 179 04/05/2018    Lab Results  Component Value Date   NA 141 06/20/2018   K 4.2 06/20/2018   CL 100 06/20/2018   CO2 28 06/20/2018   BUN 32 (H) 06/20/2018   CREATININE 1.25 (H) 06/20/2018   GLUCOSE 104 (H) 06/20/2018   ALT 29 06/20/2018    Lab Results  Component Value Date   CHOL 130 06/20/2018   HDL 36 (L) 06/20/2018   LDLCALC 55 06/20/2018   TRIG 196 (H) 06/20/2018   CHOLHDL 3.6 06/20/2018    --------------------------------------------------------------------------------------------------  ASSESSMENT AND PLAN: Chronic systolic and diastolic heart failure: Ms. Zackery appears euvolemic on exam today and reports stable NYHA class II-III heart failure symptoms.  LVEF has normalized with medical therapy and maintenance of sinus rhythm.  We will continue her current medication regimen today.  Soft blood pressure precludes escalation of losartan and metoprolol.  Persistent atrial fibrillation: Ms. Godino is maintaining sinus rhythm on amiodarone.  However, in the setting of her liver disease, her gastroenterologist would prefer alternative therapy if possible.  I will refer Ms. Marcou to electrophysiology in order to discuss further rhythm control strategies for her atrial fibrillation.  We will continue apixaban 5 mg twice daily for stroke prevention.  Atypical chest pain: Ms. Zemaitis reports rare episodes of fleeting left-sided chest pressure that is not consistent with angina.  Additionally, myocardial perfusion stress test in March was low risk.  We will defer additional work-up at this time and continue with primary prevention.  I think it is reasonable to continue low-dose statin therapy, if tolerated from a liver standpoint, given evidence of aortic and coronary artery atherosclerosis on prior chest CT.  COPD: This is likely driving much of the patient's chronic dyspnea.  Ms. Woolford should continue  follow-up with Dr. Raul Del.  Follow-up: Return to clinic in 3 months.  Nelva Bush, MD 08/04/2018 8:42 AM

## 2018-08-29 ENCOUNTER — Telehealth: Payer: Self-pay

## 2018-08-29 NOTE — Telephone Encounter (Signed)

## 2018-08-31 ENCOUNTER — Encounter: Payer: Self-pay | Admitting: Internal Medicine

## 2018-08-31 ENCOUNTER — Other Ambulatory Visit: Payer: Self-pay

## 2018-08-31 ENCOUNTER — Ambulatory Visit (INDEPENDENT_AMBULATORY_CARE_PROVIDER_SITE_OTHER): Payer: 59 | Admitting: Internal Medicine

## 2018-08-31 DIAGNOSIS — I4819 Other persistent atrial fibrillation: Secondary | ICD-10-CM
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
## 2018-08-31 NOTE — Patient Instructions (Signed)
Medication Instructions:   Your physician recommends that you continue on your current medications as directed. Please refer to the Current Medication list given to you today.   If you need a refill on your cardiac medications before your next appointment, please call your pharmacy.   Lab work:  NONE   If you have labs (blood work) drawn today and your tests are completely normal, you will receive your results only by: Marland Kitchen MyChart Message (if you have MyChart) OR . A paper copy in the mail If you have any lab test that is abnormal or we need to change your treatment, we will call you to review the results.  Testing/Procedures:  NONE  Follow-Up: At [facility], you and your health needs are our priority.  As part of our continuing mission to provide you with exceptional heart care, we have created designated Provider Care Teams.  These Care Teams include your primary Cardiologist (physician) and Advanced Practice Providers (APPs -  Physician Assistants and Nurse Practitioners) who all work together to provide you with the care you need, when you need it.  Follow up PENDING per Dr. Aquilla Hacker discussion with Dr. Saunders Revel

## 2018-09-13 ENCOUNTER — Telehealth: Payer: Self-pay | Admitting: Internal Medicine

## 2018-09-13 NOTE — Telephone Encounter (Signed)
° °  [facility] Pre-operative Risk Assessment    Request for surgical clearance:  1. What type of surgery is being performed? Colonoscopy and EGD   2. When is this surgery scheduled? 09/21/18 at [facility]   3. What type of clearance is required (medical clearance vs. Pharmacy clearance to hold med vs. Both)? both  4. Are there any medications that need to be held prior to surgery and how long?Eliquis, how many days prior    5. Practice name and name of physician performing surgery? [facility], Dr Loistine Simas     6. What is your office phone number [phone]    7.   What is your office fax number [phone]  8.   Anesthesia type (None, local, MAC, general) ? Monitored    Ariana Riggs 09/13/2018, 11:12 AM  _________________________________________________________________   (provider comments below)

## 2018-09-13 NOTE — Telephone Encounter (Signed)
LMOM for pt to call back.
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
## 2018-09-13 NOTE — Telephone Encounter (Signed)
Pt takes Eliquis for afib with CHADS2VASc score of 2 (sex, CHF). SCr 1.25, CrCl 1mL/min. Ok to hold Eliquis for 1-2 days prior to procedure.

## 2018-09-14 ENCOUNTER — Encounter: Payer: Self-pay | Admitting: *Deleted

## 2018-09-14 NOTE — Telephone Encounter (Signed)
Sent pt a mychart message asking her to contact the office.  Have made several attempts to reach her by phone, have been unsuccessful.

## 2018-09-14 NOTE — Telephone Encounter (Signed)
Tried patient again, got VM. LMOM to call us back. Will route to callback to please give update to requesting office to reassure that we have received request but we are waiting until pt calls [place] back to finalize clearance. Please send pt a Mychart message to call us back or provide a different #.

## 2018-09-15 NOTE — Telephone Encounter (Signed)
Lvm no answer

## 2018-09-18 ENCOUNTER — Other Ambulatory Visit
Admission: RE | Admit: 2018-09-18 | Discharge: 2018-09-18 | Disposition: A | Discharge status: Home / Self Care | Payer: 59 | Source: Ambulatory Visit | Attending: Gastroenterology | Admitting: Gastroenterology

## 2018-09-18 ENCOUNTER — Other Ambulatory Visit: Payer: Self-pay

## 2018-09-18 DIAGNOSIS — Z20828 Contact with and (suspected) exposure to other viral communicable diseases: Secondary | ICD-10-CM | POA: Diagnosis not present

## 2018-09-18 DIAGNOSIS — Z01812 Encounter for preprocedural laboratory examination: Secondary | ICD-10-CM | POA: Insufficient documentation

## 2018-09-18 LAB — SARS CORONAVIRUS 2 (TAT 6-24 HRS): SARS Coronavirus 2: NEGATIVE

## 2018-09-18 NOTE — Telephone Encounter (Signed)
Left message for pt to call.    Pre-op call back, could you please let GI know we have been unable to reach pt to eval for Pre-op clearance.  They may have another number. Thanks.

## 2018-09-20 ENCOUNTER — Encounter: Payer: Self-pay | Admitting: *Deleted

## 2018-09-20 NOTE — Telephone Encounter (Signed)
Called and spoke with the patient and she stated that she tried calling our office back yesrterday (09-19-18) but could not get through. Patient stated that she was informed by [facility] to hold her Eliquis for 2 days prior and to restart as soon as possible afterwards. Patient verbalized an understanding and all (if any) questions were answered.

## 2018-09-20 NOTE — Telephone Encounter (Signed)
   Primary Cardiologist: Nelva Bush, MD  Chart reviewed as part of pre-operative protocol coverage. Patient was contacted 09/20/2018 in reference to pre-operative risk assessment for pending surgery as outlined below.  Ariana Riggs was last seen on 08/31/2018 by Dr. Caryl Comes.  Since that day, Ariana Riggs has done well from a cardiac standpoint. She is relatively inactive at baseline though is able to perform ADLs and household chores (cooking/cleaning/laundry) without complaints of chest pain or SOB. She denies any palpitations.   Therefore, based on ACC/AHA guidelines, the patient would be at acceptable risk for the planned procedure without further cardiovascular testing.   Per pharmacy recommendations, patient was cleared to hold apixaban 1-2 days prior to her procedure. Patient reported holding this medication starting yesterday, with plans to restart as soon as she is cleared to do so by her surgeon.   I will route this recommendation to the requesting party via Epic fax function and remove from pre-op pool.  Please call with questions.  Abigail Butts, PA-C 09/20/2018, 2:02 PM

## 2018-09-20 NOTE — Telephone Encounter (Signed)
Called the requesting office and spoke with Butch Penny she stated that their office has the same telephone number listed that we do. Butch Penny stated that she will pass the information along to the nurse and try calling the patient as well since her procedure is scheduled for tomorrow 09/21/18.

## 2018-09-21 ENCOUNTER — Ambulatory Visit: Payer: 59 | Admitting: Anesthesiology

## 2018-09-21 ENCOUNTER — Ambulatory Visit
Admission: RE | Admit: 2018-09-21 | Discharge: 2018-09-21 | Disposition: A | Discharge status: Home / Self Care | Payer: 59 | Attending: Gastroenterology | Admitting: Gastroenterology

## 2018-09-21 ENCOUNTER — Encounter: Payer: Self-pay | Admitting: *Deleted

## 2018-09-21 ENCOUNTER — Encounter
Admission: RE | Disposition: A | Discharge status: Home / Self Care | Payer: Self-pay | Source: Home / Self Care | Attending: Gastroenterology

## 2018-09-21 ENCOUNTER — Other Ambulatory Visit: Payer: Self-pay

## 2018-09-21 DIAGNOSIS — K640 First degree hemorrhoids: Secondary | ICD-10-CM | POA: Diagnosis not present

## 2018-09-21 DIAGNOSIS — K319 Disease of stomach and duodenum, unspecified: Secondary | ICD-10-CM | POA: Insufficient documentation

## 2018-09-21 DIAGNOSIS — Z8371 Family history of colonic polyps: Secondary | ICD-10-CM | POA: Insufficient documentation

## 2018-09-21 DIAGNOSIS — Z79899 Other long term (current) drug therapy: Secondary | ICD-10-CM | POA: Insufficient documentation

## 2018-09-21 DIAGNOSIS — K228 Other specified diseases of esophagus: Secondary | ICD-10-CM | POA: Diagnosis not present

## 2018-09-21 DIAGNOSIS — Z1211 Encounter for screening for malignant neoplasm of colon: Secondary | ICD-10-CM | POA: Diagnosis not present

## 2018-09-21 DIAGNOSIS — E785 Hyperlipidemia, unspecified: Secondary | ICD-10-CM | POA: Insufficient documentation

## 2018-09-21 DIAGNOSIS — K7460 Unspecified cirrhosis of liver: Secondary | ICD-10-CM | POA: Insufficient documentation

## 2018-09-21 DIAGNOSIS — G4730 Sleep apnea, unspecified: Secondary | ICD-10-CM | POA: Insufficient documentation

## 2018-09-21 DIAGNOSIS — Z882 Allergy status to sulfonamides status: Secondary | ICD-10-CM | POA: Insufficient documentation

## 2018-09-21 DIAGNOSIS — K210 Gastro-esophageal reflux disease with esophagitis: Secondary | ICD-10-CM | POA: Diagnosis not present

## 2018-09-21 DIAGNOSIS — Z7901 Long term (current) use of anticoagulants: Secondary | ICD-10-CM | POA: Diagnosis not present

## 2018-09-21 DIAGNOSIS — I509 Heart failure, unspecified: Secondary | ICD-10-CM | POA: Insufficient documentation

## 2018-09-21 DIAGNOSIS — F419 Anxiety disorder, unspecified: Secondary | ICD-10-CM | POA: Diagnosis not present

## 2018-09-21 DIAGNOSIS — J449 Chronic obstructive pulmonary disease, unspecified: Secondary | ICD-10-CM | POA: Insufficient documentation

## 2018-09-21 DIAGNOSIS — F319 Bipolar disorder, unspecified: Secondary | ICD-10-CM | POA: Diagnosis not present

## 2018-09-21 DIAGNOSIS — I429 Cardiomyopathy, unspecified: Secondary | ICD-10-CM | POA: Diagnosis not present

## 2018-09-21 DIAGNOSIS — I4819 Other persistent atrial fibrillation: Secondary | ICD-10-CM | POA: Diagnosis not present

## 2018-09-21 DIAGNOSIS — K2210 Ulcer of esophagus without bleeding: Secondary | ICD-10-CM | POA: Diagnosis not present

## 2018-09-21 HISTORY — PX: COLONOSCOPY WITH PROPOFOL: SHX5780

## 2018-09-21 HISTORY — PX: ESOPHAGOGASTRODUODENOSCOPY (EGD) WITH PROPOFOL: SHX5813

## 2018-09-21 LAB — PROTIME-INR
INR: 1.1 (ref 0.8–1.2)
Prothrombin Time: 14.3 seconds (ref 11.4–15.2)

## 2018-09-21 SURGERY — COLONOSCOPY WITH PROPOFOL
Anesthesia: General

## 2018-09-21 MED ORDER — PROPOFOL 500 MG/50ML IV EMUL
INTRAVENOUS | Status: DC | PRN
  Administered 2018-09-21: 80 ug/kg/min via INTRAVENOUS
  Administered 2018-09-21: 120 ug/kg/min via INTRAVENOUS

## 2018-09-21 MED ORDER — EPHEDRINE SULFATE 50 MG/ML IJ SOLN
INTRAMUSCULAR | Status: AC
  Filled 2018-09-21: qty 1

## 2018-09-21 MED ORDER — PHENYLEPHRINE HCL (PRESSORS) 10 MG/ML IV SOLN
INTRAVENOUS | Status: AC
  Filled 2018-09-21: qty 1

## 2018-09-21 MED ORDER — FENTANYL CITRATE (PF) 100 MCG/2ML IJ SOLN
INTRAMUSCULAR | Status: AC
  Filled 2018-09-21: qty 2

## 2018-09-21 MED ORDER — EPHEDRINE SULFATE 50 MG/ML IJ SOLN
INTRAMUSCULAR | Status: DC | PRN
  Administered 2018-09-21: 10 mg via INTRAVENOUS

## 2018-09-21 MED ORDER — PHENYLEPHRINE HCL (PRESSORS) 10 MG/ML IV SOLN
INTRAVENOUS | Status: DC | PRN
  Administered 2018-09-21 (×2): 100 ug via INTRAVENOUS

## 2018-09-21 MED ORDER — PROPOFOL 500 MG/50ML IV EMUL
INTRAVENOUS | Status: AC
  Filled 2018-09-21: qty 50

## 2018-09-21 MED ORDER — MIDAZOLAM HCL 2 MG/2ML IJ SOLN
INTRAMUSCULAR | Status: DC | PRN
  Administered 2018-09-21: 1 mg via INTRAVENOUS

## 2018-09-21 MED ORDER — PROPOFOL 10 MG/ML IV BOLUS
INTRAVENOUS | Status: DC | PRN
  Administered 2018-09-21 (×4): 20 mg via INTRAVENOUS
  Administered 2018-09-21: 30 mg via INTRAVENOUS
  Administered 2018-09-21: 40 mg via INTRAVENOUS

## 2018-09-21 MED ORDER — FENTANYL CITRATE (PF) 100 MCG/2ML IJ SOLN
INTRAMUSCULAR | Status: DC | PRN
  Administered 2018-09-21: 50 ug via INTRAVENOUS

## 2018-09-21 MED ORDER — LIDOCAINE HCL (PF) 2 % IJ SOLN
INTRAMUSCULAR | Status: AC
  Filled 2018-09-21: qty 10

## 2018-09-21 MED ORDER — SODIUM CHLORIDE 0.9 % IV SOLN
INTRAVENOUS | Status: DC
  Administered 2018-09-21: 10:00:00 via INTRAVENOUS

## 2018-09-21 MED ORDER — MIDAZOLAM HCL 2 MG/2ML IJ SOLN
INTRAMUSCULAR | Status: AC
  Filled 2018-09-21: qty 2

## 2018-09-21 MED ORDER — PROPOFOL 10 MG/ML IV BOLUS
INTRAVENOUS | Status: AC
  Filled 2018-09-21: qty 20

## 2018-09-21 NOTE — Op Note (Signed)
[facility] Patient Name: Ariana Riggs Procedure Date: 09/21/2018 9:34 AM MRN: [id number] Account #: [id number] Date of Birth: 26-Nov-1960 Admit Type: Outpatient Age: 58 Room: [facility] ENDO ROOM 1 Gender: Female Note Status: Finalized Procedure:            Colonoscopy Indications:          Family history of colonic polyps in a first-degree                        relative Providers:            Lollie Sails, MD Medicines:            Monitored Anesthesia Care Complications:        No immediate complications. Procedure:            Pre-Anesthesia Assessment:                       - ASA Grade Assessment: III - A patient with severe                        systemic disease.                       After obtaining informed consent, the colonoscope was                        passed under direct vision. Throughout the procedure,                        the patient's blood pressure, pulse, and oxygen                        saturations were monitored continuously. The                        Colonoscope was introduced through the anus and                        advanced to the the cecum, identified by appendiceal                        orifice and ileocecal valve. The colonoscopy was                        unusually difficult due to poor bowel prep. Successful                        completion of the procedure was aided by changing the                        patient to a supine position and lavage. The patient                        tolerated the procedure well. The quality of the bowel                        preparation was fair. Findings:      The colon (entire examined portion) appeared normal.      Non-bleeding internal hemorrhoids were found during retroflexion. The       hemorrhoids  were small and Grade I (internal hemorrhoids that do not       prolapse).      The digital rectal exam was normal. Impression:           - Preparation of the colon was fair.                       - The entire examined colon is normal.                       - Non-bleeding internal hemorrhoids.                       - No specimens collected. Recommendation:       - Discharge patient to home.                       - Repeat colonoscopy in 5 years for screening purposes. Procedure Code(s):    --- Professional ---                       [phone], Colonoscopy, flexible; diagnostic, including                        collection of specimen(s) by brushing or washing, when                        performed (separate procedure) Diagnosis Code(s):    --- Professional ---                       K64.0, First degree hemorrhoids                       Z83.71, Family history of colonic polyps CPT copyright 2019 American Medical Association. All rights reserved. The codes documented in this report are preliminary and upon coder review may  be revised to meet current compliance requirements. Lollie Sails, MD 09/21/2018 10:57:01 AM This report has been signed electronically. Number of Addenda: 0 Note Initiated On: 09/21/2018 9:34 AM Scope Withdrawal Time: 0 hours 5 minutes 19 seconds  Total Procedure Duration: 0 hours 27 minutes 26 seconds       [facility]

## 2018-09-21 NOTE — Op Note (Signed)
[facility] Patient Name: Ariana Riggs Procedure Date: 09/21/2018 9:35 AM MRN: [id number] Account #: [id number] Date of Birth: Sep 26, 1960 Admit Type: Outpatient Age: 58 Room: [facility] ENDO ROOM 1 Gender: Female Note Status: Finalized Procedure:            Upper GI endoscopy Indications:          Cirrhosis rule out esophageal varices Providers:            Lollie Sails, MD Referring MD:         Christena Flake. Raechel Ache, MD (Referring MD) Medicines:            Monitored Anesthesia Care Complications:        No immediate complications. Procedure:            Pre-Anesthesia Assessment:                       - ASA Grade Assessment: III - A patient with severe                        systemic disease.                       After obtaining informed consent, the endoscope was                        passed under direct vision. Throughout the procedure,                        the patient's blood pressure, pulse, and oxygen                        saturations were monitored continuously. The Endoscope                        was introduced through the mouth, and advanced to the                        third part of duodenum. The upper GI endoscopy was                        accomplished without difficulty. The patient tolerated                        the procedure well. Findings:      no evidence of esophageal or gastric varices      The Z-line was irregular. Biopsies were taken with a cold forceps for       histology.      LA Grade A (one or more mucosal breaks less than 5 mm, not extending       between tops of 2 mucosal folds) esophagitis with no bleeding was found.      Patchy minimal inflammation characterized by congestion (edema) and       erythema was found in the gastric body. Biopsies were taken with a cold       forceps for histology. Biopsies were taken with a cold forceps for       Helicobacter pylori testing.      The cardia and gastric fundus were normal  on retroflexion.      The examined duodenum was normal. Impression:           -  Z-line irregular. Biopsied.                       - LA Grade A erosive esophagitis.                       - Gastritis. Biopsied.                       - Normal examined duodenum. Recommendation:       - Use Prevacid (lansoprazole) 30 mg PO daily daily. Procedure Code(s):    --- Professional ---                       [phone], Esophagogastroduodenoscopy, flexible, transoral;                        with biopsy, single or multiple Diagnosis Code(s):    --- Professional ---                       K22.8, Other specified diseases of esophagus                       K20.8, Other esophagitis                       K29.70, Gastritis, unspecified, without bleeding                       K74.60, Unspecified cirrhosis of liver CPT copyright 2019 American Medical Association. All rights reserved. The codes documented in this report are preliminary and upon coder review may  be revised to meet current compliance requirements. Lollie Sails, MD 09/21/2018 10:21:38 AM This report has been signed electronically. Number of Addenda: 0 Note Initiated On: 09/21/2018 9:35 AM Total Procedure Duration: 0 hours 11 minutes 46 seconds       [facility]
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
## 2018-09-21 NOTE — Transfer of Care (Signed)
Immediate Anesthesia Transfer of Care Note  Patient: Ariana Riggs  Procedure(s) Performed: COLONOSCOPY WITH PROPOFOL (N/A ) ESOPHAGOGASTRODUODENOSCOPY (EGD) WITH PROPOFOL (N/A )  Patient Location: PACU  Anesthesia Type:General  Level of Consciousness: sedated  Airway & Oxygen Therapy: Patient Spontanous Breathing and Patient connected to nasal cannula oxygen  Post-op Assessment: Report given to RN and Post -op Vital signs reviewed and stable  Post vital signs: Reviewed and stable  Last Vitals:  Vitals Value Taken Time  BP 117/82 09/21/18 1101  Temp 36.1 C 09/21/18 1101  Pulse 76 09/21/18 1104  Resp 20 09/21/18 1104  SpO2 96 % 09/21/18 1104  Vitals shown include unvalidated device data.  Last Pain:  Vitals:   09/21/18 1101  TempSrc: Tympanic  PainSc: Asleep         Complications: No apparent anesthesia complications

## 2018-09-21 NOTE — Anesthesia Post-op Follow-up Note (Signed)
Anesthesia QCDR form completed.        

## 2018-09-22 ENCOUNTER — Encounter: Payer: Self-pay | Admitting: Gastroenterology

## 2018-09-22 LAB — SURGICAL PATHOLOGY

## 2018-09-22 NOTE — Anesthesia Postprocedure Evaluation (Signed)
Anesthesia Post Note  Patient: Ariana Riggs  Procedure(s) Performed: COLONOSCOPY WITH PROPOFOL (N/A ) ESOPHAGOGASTRODUODENOSCOPY (EGD) WITH PROPOFOL (N/A )  Patient location during evaluation: Endoscopy Anesthesia Type: General Level of consciousness: awake and alert and oriented Pain management: pain level controlled Vital Signs Assessment: post-procedure vital signs reviewed and stable Respiratory status: spontaneous breathing Cardiovascular status: blood pressure returned to baseline Anesthetic complications: no     Last Vitals:  Vitals:   09/21/18 1101 09/21/18 1131  BP: 117/82 126/76  Pulse:    Resp:    Temp: (!) 36.1 C   SpO2:      Last Pain:  Vitals:   09/22/18 0719  TempSrc:   PainSc: 0-No pain                 [name]

## 2018-10-13 ENCOUNTER — Ambulatory Visit (INDEPENDENT_AMBULATORY_CARE_PROVIDER_SITE_OTHER): Payer: 59 | Admitting: Internal Medicine

## 2018-10-13 ENCOUNTER — Encounter: Payer: Self-pay | Admitting: Internal Medicine

## 2018-10-13 ENCOUNTER — Other Ambulatory Visit: Payer: Self-pay

## 2018-10-13 VITALS — BP 100/60 | HR 68 | Ht 65.0 in | Wt 218.2 lb

## 2018-10-13 DIAGNOSIS — I5032 Chronic diastolic (congestive) heart failure: Secondary | ICD-10-CM | POA: Diagnosis not present

## 2018-10-13 DIAGNOSIS — I5042 Chronic combined systolic (congestive) and diastolic (congestive) heart failure: Secondary | ICD-10-CM | POA: Diagnosis not present

## 2018-10-13 DIAGNOSIS — J449 Chronic obstructive pulmonary disease, unspecified: Secondary | ICD-10-CM

## 2018-10-13 DIAGNOSIS — Z79899 Other long term (current) drug therapy: Secondary | ICD-10-CM

## 2018-10-13 DIAGNOSIS — I4819 Other persistent atrial fibrillation: Secondary | ICD-10-CM

## 2018-10-13 DIAGNOSIS — R0789 Other chest pain: Secondary | ICD-10-CM

## 2018-10-13 MED ORDER — FUROSEMIDE 40 MG PO TABS: ORAL_TABLET | ORAL | 2 refills | Status: DC

## 2018-10-13 NOTE — Progress Notes (Signed)
Follow-up Outpatient Visit Date: 10/13/2018  Primary Care Provider: Ezequiel Kayser, MD [facility] [facility] [place] [address]  Chief Complaint: Follow-up atrial fibrillation and heart failure  HPI:  Ariana Riggs is a 58 y.o. year-old female with history of systolic heart failure diagnosed in 03/2018 due to presumed tachycardia mediated cardiomyopathy in the setting of atrial fibrillation with rapid ventricular response, PAF, remote ASD repair, cirrhosis, bipolar disorder, obstructive sleep apnea, and polysubstance abuse, who presents for follow-up of cardiomyopathy and atrial fibrillation.  I last saw her in June, at which time she reported stable exertional dyspnea and some residual abdominal distention.  She also endorsed brief episodes of left-sided pressure lasting 1 or 2 seconds.  She was trying to increase her activity.  She saw Dr. Caryl Comes last month to assist with management of her persistent atrial fibrillation in the setting of liver disease.  She was felt to be a poor candidate for antiarrhythmic therapy other than amiodarone.  Dr. Caryl Comes was to discuss the feasibility of pulmonary vein isolation in the setting of prior ASD repair with Dr. Rayann Heman.  Today, Ariana Riggs reports that she has noticed progressive shortness of breath and abdominal distention.  She is concerned that she is retaining fluid.  She also has some tightness in her chest from time to time.  She denies palpitations, lightheadedness, and leg edema, though she has continued to put on weight and has also experienced worsening orthopnea.  --------------------------------------------------------------------------------------------------  Cardiovascular History & Procedures: Cardiovascular Problems:  Systolic heart failure (presumed nonischemic with low-risk MPI - 04/2018)  Paroxysmal atrial fibrillation  ASD s/p repair (~1980)  Risk Factors:  Hyperlipidemia, obesity, and history of polysubstance abuse   Cath/PCI:  None  CV Surgery:  None  EP Procedures and Devices:  None  Non-Invasive Evaluation(s):  Echocardiogram (06/20/2018): Mildly dilated left ventricle with upper normal wall thickness. LVEF 55-60% with grade 2 diastolic dysfunction and elevated filling pressure. Moderately enlarged right ventricle with normal contraction. Mildly elevated RVSP (35 to 40 mmHg). Moderate left atrial and mild right atrial enlargement. Moderate mitral and tricuspid regurgitation.  Pharmacologic MPI (04/28/2018): Small in size, mild in severity, fixed apical inferior defect most likely representing attenuation artifact. No ischemia. LVEF 55%.  Echocardiogram (03/28/2010): Normal LV size with LVEF of 30-35%. Moderately dilated RV with moderately reduced systolic function. Moderate to severe biatrial enlargement. Moderate to severe atrial regurgitation. Severe tricuspid regurgitation.  Recent CV Pertinent Labs: Lab Results  Component Value Date   CHOL 130 06/20/2018   HDL 36 (L) 06/20/2018   LDLCALC 55 06/20/2018   TRIG 196 (H) 06/20/2018   CHOLHDL 3.6 06/20/2018   INR 1.1 09/21/2018   BNP 701.0 (H) 04/03/2018   K 4.2 06/20/2018   MG 2.2 06/20/2018   BUN 32 (H) 06/20/2018   BUN 19 05/02/2018   CREATININE 1.25 (H) 06/20/2018    Past medical and surgical history were reviewed and updated in EPIC.  Current Meds  Medication Sig  . albuterol (PROVENTIL HFA;VENTOLIN HFA) 108 (90 Base) MCG/ACT inhaler Inhale into the lungs every 6 (six) hours as needed for wheezing or shortness of breath.  Marland Kitchen albuterol (PROVENTIL) (2.5 MG/3ML) 0.083% nebulizer solution Inhale 3 mLs into the lungs every 6 (six) hours as needed for wheezing.  Marland Kitchen allopurinol (ZYLOPRIM) 300 MG tablet Take 0.5 tablets (150 mg total) by mouth daily. (Patient taking differently: Take 300 mg by mouth daily. )  . amiodarone (PACERONE) 200 MG tablet Take 1 tablet (200 mg total)  by mouth daily.  Marland Kitchen apixaban (ELIQUIS) 5 MG TABS  tablet Take 1 tablet (5 mg total) by mouth 2 (two) times daily.  Marland Kitchen atorvastatin (LIPITOR) 10 MG tablet Take 1 tablet (10 mg total) by mouth daily.  Marland Kitchen buPROPion (ZYBAN) 150 MG 12 hr tablet Take 150 mg by mouth daily.  . citalopram (CELEXA) 20 MG tablet Take 20 mg by mouth daily.  . furosemide (LASIX) 40 MG tablet Take 1 tablet (40 mg total) by mouth 2 (two) times daily.  Marland Kitchen lactulose (CHRONULAC) 10 GM/15ML solution 60 g 2 (two) times a day.   . lansoprazole (PREVACID) 30 MG capsule Take 30 mg by mouth daily at 12 noon.  Marland Kitchen losartan (COZAAR) 25 MG tablet Take 1 tablet (25 mg total) by mouth daily.  . metoprolol succinate (TOPROL-XL) 25 MG 24 hr tablet Take 0.5 tablets (12.5 mg total) by mouth daily.  . montelukast (SINGULAIR) 10 MG tablet Take 10 mg by mouth Nightly.  . multivitamin-iron-minerals-folic acid (CENTRUM) chewable tablet Chew 1 tablet by mouth daily.  . potassium chloride (KLOR-CON) 20 MEQ packet Take 20 mEq by mouth daily.  . QUEtiapine (SEROQUEL XR) 200 MG 24 hr tablet Take 200 mg by mouth Nightly.  Marland Kitchen spironolactone (ALDACTONE) 25 MG tablet Take 1 tablet (25 mg total) by mouth daily.  Marland Kitchen umeclidinium-vilanterol (ANORO ELLIPTA) 62.5-25 MCG/INH AEPB Inhale 1 puff into the lungs daily.    Allergies: Doxycycline, Paxil [paroxetine hcl], Penicillins, and Sulfa antibiotics  Social History   Tobacco Use  . Smoking status: Former Smoker    Packs/day: 0.25    Years: 40.00    Pack years: 10.00    Types: Cigarettes    Quit date: 03/13/2018    Years since quitting: 0.5  . Smokeless tobacco: Never Used  Substance Use Topics  . Alcohol use: Not Currently    Alcohol/week: 4.0 standard drinks    Types: 4 Cans of beer per week    Comment: stopped 5 weeks ago  . Drug use: Not Currently    Types: "Crack" cocaine    Comment: quit 10 years ago    Family History  Problem Relation Age of Onset  . Hypertension Mother   . Diabetes Mother   . Peripheral Artery Disease Mother   . Dementia  Father   . Hypertension Father   . Peripheral Artery Disease Sister   . Breast cancer Neg Hx     Review of Systems: A 12-system review of systems was performed and was negative except as noted in the HPI.  --------------------------------------------------------------------------------------------------  Physical Exam: BP 100/60 (BP Location: Left Arm, Patient Position: Sitting, Cuff Size: Normal)   Pulse 68   Ht 5\' 5"  (1.651 m)   Wt 218 lb 4 oz (99 kg)   BMI 36.32 kg/m   General: NAD. HEENT: No conjunctival pallor or scleral icterus.  Facemask in place. Neck: Supple without lymphadenopathy, thyromegaly, JVD, or HJR, though evaluation is limited by body habitus. Lungs: Normal work of breathing. Clear to auscultation bilaterally without wheezes or crackles. Heart: Regular rate and rhythm without murmurs, rubs, or gallops. Non-displaced PMI. Abd: Bowel sounds present.  Soft and mildly distended. Ext: No lower extremity edema. Skin: Warm and dry without rash.  EKG: Normal sinus rhythm with anterolateral T wave inversions and nonspecific ST changes.  No significant change from prior tracing on 08/31/2018.  Lab Results  Component Value Date   WBC 9.1 04/05/2018   HGB 16.4 (H) 04/05/2018   HCT 50.3 (H) 04/05/2018  MCV 97.7 04/05/2018   PLT 179 04/05/2018    Lab Results  Component Value Date   NA 141 06/20/2018   K 4.2 06/20/2018   CL 100 06/20/2018   CO2 28 06/20/2018   BUN 32 (H) 06/20/2018   CREATININE 1.25 (H) 06/20/2018   GLUCOSE 104 (H) 06/20/2018   ALT 29 06/20/2018    Lab Results  Component Value Date   CHOL 130 06/20/2018   HDL 36 (L) 06/20/2018   LDLCALC 55 06/20/2018   TRIG 196 (H) 06/20/2018   CHOLHDL 3.6 06/20/2018    --------------------------------------------------------------------------------------------------  ASSESSMENT AND PLAN: Persistent atrial fibrillation: Patient seems to be maintaining sinus rhythm on amiodarone.  Patient was  recently seen by Dr. Caryl Comes, who felt that benefits of amiodarone outweigh risks.  We will therefore continue current medications, occluding amiodarone, metoprolol, and apixaban.  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  She does not have any significant edema on exam today, though her weight is up 6 pounds over the last 2 months.  I recommended increasing furosemide to 80 mg every morning and 40 mg every afternoon.  I will check a basic metabolic panel today and again in 2 weeks.  We will continue current doses of metoprolol, losartan, and spironolactone.  Atypical chest pain: Chest pressure seems more pronounced since our last visit.  Myocardial perfusion stress test earlier this year was low risk.  I wonder if fluid retention and abdominal distention are contributing to her chest tightness.  We will escalate diuresis, as above.  We have agreed to defer additional ischemia evaluation at this time.  COPD: This likely underlies much of the patient's dyspnea.  She should continue to follow with Dr. Raul Del, her pulmonologist.  Follow-up: Return to clinic in 6 weeks.  Nelva Bush, MD 10/13/2018 10:33 AM

## 2018-10-13 NOTE — Patient Instructions (Addendum)
Medication Instructions:  Your physician has recommended you make the following change in your medication:  1- INCREASE Furosemide to 80 mg (2 tablets) by mouth in the morning and 40 mg (1 tablet) by mouth in the afternoon.  If you need a refill on your cardiac medications before your next appointment, please call your pharmacy.   Lab work: Your physician recommends that you return for lab work in: [place] (BMET).   Your physician recommends that you return for lab work in: [place].  - On October 27, 2018.  Please go to the [facility]. You will check in at the front desk to the right as you walk into the atrium. Valet Parking is offered if needed.   If you have labs (blood work) drawn today and your tests are completely normal, you will receive your results only by: Marland Kitchen MyChart Message (if you have MyChart) OR . A paper copy in the mail If you have any lab test that is abnormal or we need to change your treatment, we will call you to review the results.  Testing/Procedures: - None ordered.   Follow-Up: At [facility], you and your health needs are our priority.  As part of our continuing mission to provide you with exceptional heart care, we have created designated Provider Care Teams.  These Care Teams include your primary Cardiologist (physician) and Advanced Practice Providers (APPs -  Physician Assistants and Nurse Practitioners) who all work together to provide you with the care you need, when you need it. You will need a follow up appointment in 6 weeks.   You may see Nelva Bush, MD or one of the following Advanced Practice Providers on your designated Care Team:   Murray Hodgkins, NP Christell Faith, PA-C . Marrianne Mood, PA-C

## 2018-10-14 LAB — BASIC METABOLIC PANEL
BUN/Creatinine Ratio: 12 (ref 9–23)
BUN: 14 mg/dL (ref 6–24)
CO2: 25 mmol/L (ref 20–29)
Calcium: 9.2 mg/dL (ref 8.7–10.2)
Chloride: 101 mmol/L (ref 96–106)
Creatinine, Ser: 1.13 mg/dL — ABNORMAL HIGH (ref 0.57–1.00)
GFR calc Af Amer: 61 mL/min/{1.73_m2} (ref >59)
GFR calc non Af Amer: 53 mL/min/{1.73_m2} — ABNORMAL LOW (ref >59)
Glucose: 93 mg/dL (ref 65–99)
Potassium: 4.1 mmol/L (ref 3.5–5.2)
Sodium: 141 mmol/L (ref 134–144)

## 2018-10-16 ENCOUNTER — Other Ambulatory Visit: Payer: Self-pay | Admitting: Gastroenterology

## 2018-10-16 DIAGNOSIS — K7460 Unspecified cirrhosis of liver: Secondary | ICD-10-CM

## 2018-10-20 ENCOUNTER — Ambulatory Visit: Payer: 59

## 2018-10-26 ENCOUNTER — Other Ambulatory Visit: Payer: Self-pay

## 2018-10-26 ENCOUNTER — Ambulatory Visit
Admission: RE | Admit: 2018-10-26 | Discharge: 2018-10-26 | Disposition: A | Discharge status: Home / Self Care | Payer: 59 | Source: Ambulatory Visit | Attending: Gastroenterology | Admitting: Gastroenterology

## 2018-10-26 DIAGNOSIS — K7460 Unspecified cirrhosis of liver: Secondary | ICD-10-CM | POA: Insufficient documentation

## 2018-10-27 ENCOUNTER — Other Ambulatory Visit
Admission: RE | Admit: 2018-10-27 | Discharge: 2018-10-27 | Disposition: A | Discharge status: Home / Self Care | Payer: 59 | Source: Ambulatory Visit | Attending: Internal Medicine | Admitting: Internal Medicine

## 2018-10-27 ENCOUNTER — Telehealth: Payer: Self-pay | Admitting: *Deleted

## 2018-10-27 DIAGNOSIS — I4819 Other persistent atrial fibrillation: Secondary | ICD-10-CM

## 2018-10-27 DIAGNOSIS — I5042 Chronic combined systolic (congestive) and diastolic (congestive) heart failure: Secondary | ICD-10-CM

## 2018-10-27 DIAGNOSIS — Z79899 Other long term (current) drug therapy: Secondary | ICD-10-CM | POA: Diagnosis present

## 2018-10-27 LAB — BASIC METABOLIC PANEL
Anion gap: 10 (ref 5–15)
BUN: 11 mg/dL (ref 6–20)
CO2: 25 mmol/L (ref 22–32)
Calcium: 9 mg/dL (ref 8.9–10.3)
Chloride: 106 mmol/L (ref 98–111)
Creatinine, Ser: 0.94 mg/dL (ref 0.44–1.00)
GFR calc Af Amer: >60 mL/min (ref >60)
GFR calc non Af Amer: >60 mL/min (ref >60)
Glucose, Bld: 128 mg/dL — ABNORMAL HIGH (ref 70–99)
Potassium: 3.5 mmol/L (ref 3.5–5.1)
Sodium: 141 mmol/L (ref 135–145)

## 2018-10-27 MED ORDER — POTASSIUM CHLORIDE 20 MEQ PO PACK: 20.0000 meq | PACK | Freq: Two times a day (BID) | ORAL | 2 refills | Status: DC

## 2018-10-27 NOTE — Telephone Encounter (Signed)
-----   Message from Nelva Bush, MD sent at 10/27/2018  2:56 PM EDT ----- Please let Ms. Shiverdecker know that her kidney function has improved.  Her potassium is borderline low, which could be related to recent increase in the dose of furosemide.  I recommend increasing potassium chloride to 20 mEq twice daily.  She should continue the remainder of her medications, including her current dose of furosemide.

## 2018-10-27 NOTE — Telephone Encounter (Signed)
Results called to pt. Pt verbalized understanding of results and to increase potassium to 20 mEq two times a day and continue other medications. Rx sent to pharmacy.

## 2018-11-27 ENCOUNTER — Ambulatory Visit: Payer: 59 | Admitting: Internal Medicine

## 2018-11-27 NOTE — Progress Notes (Deleted)
Follow-up Outpatient Visit Date: 11/27/2018  Primary Care Provider: Ezequiel Kayser, MD [facility] [place] [address]  Chief Complaint: ***  HPI:  Ariana Riggs is a 58 y.o. year-old female with history of systolic heart failure diagnosed in 03/2018 due to presumed tachycardia mediated cardiomyopathy in the setting of atrial fibrillation with rapid ventricular response, PAF, remote ASD repair, cirrhosis, bipolar disorder, obstructive sleep apnea, and polysubstance abuse, who presents for follow-up of heart failure and atrial fibrillation.  I last saw her in late August, at which time Ms. Converse reported progressive dyspnea and abdominal distention.  She remained in sinus rhythm at that time, on amiodarone.  Though no significant peripheral edema was evident on exam, her weight was up 6 pounds over the preceding 2 months.  We agreed to increase furosemide to 80 mg every morning and 40 mg every afternoon.  --------------------------------------------------------------------------------------------------  Cardiovascular History & Procedures: Cardiovascular Problems:  Systolic heart failure (presumed nonischemic with low-risk MPI - 04/2018)  Paroxysmal atrial fibrillation  ASD s/p repair (~1980)  Risk Factors:  Hyperlipidemia, obesity, and history of polysubstance abuse  Cath/PCI:  None  CV Surgery:  None  EP Procedures and Devices:  None  Non-Invasive Evaluation(s):  Echocardiogram (06/20/2018): Mildly dilated left ventricle with upper normal wall thickness. LVEF 55-60% with grade 2 diastolic dysfunction and elevated filling pressure. Moderately enlarged right ventricle with normal contraction. Mildly elevated RVSP (35 to 40 mmHg). Moderate left atrial and mild right atrial enlargement. Moderate mitral and tricuspid regurgitation.  Pharmacologic MPI (04/28/2018): Small in size, mild in severity, fixed apical inferior defect most likely  representing attenuation artifact. No ischemia. LVEF 55%.  Echocardiogram (03/28/2010): Normal LV size with LVEF of 30-35%. Moderately dilated RV with moderately reduced systolic function. Moderate to severe biatrial enlargement. Moderate to severe atrial regurgitation. Severe tricuspid regurgitation.  Recent CV Pertinent Labs: Lab Results  Component Value Date   CHOL 130 06/20/2018   HDL 36 (L) 06/20/2018   LDLCALC 55 06/20/2018   TRIG 196 (H) 06/20/2018   CHOLHDL 3.6 06/20/2018   INR 1.1 09/21/2018   BNP 701.0 (H) 04/03/2018   K 3.5 10/27/2018   MG 2.2 06/20/2018   BUN 11 10/27/2018   BUN 14 10/13/2018   CREATININE 0.94 10/27/2018    Past medical and surgical history were reviewed and updated in EPIC.  No outpatient medications have been marked as taking for the 11/27/18 encounter (Appointment) with Dvontae Ruan, Harrell Gave, MD.    Allergies: Doxycycline, Paxil [paroxetine hcl], Penicillins, and Sulfa antibiotics  Social History   Tobacco Use  . Smoking status: Former Smoker    Packs/day: 0.25    Years: 40.00    Pack years: 10.00    Types: Cigarettes    Quit date: 03/13/2018    Years since quitting: 0.7  . Smokeless tobacco: Never Used  Substance Use Topics  . Alcohol use: Not Currently    Alcohol/week: 4.0 standard drinks    Types: 4 Cans of beer per week    Comment: stopped 5 weeks ago  . Drug use: Not Currently    Types: "Crack" cocaine    Comment: quit 10 years ago    Family History  Problem Relation Age of Onset  . Hypertension Mother   . Diabetes Mother   . Peripheral Artery Disease Mother   . Dementia Father   . Hypertension Father   . Peripheral Artery Disease Sister   . Breast cancer Neg Hx     Review of  Systems: A 12-system review of systems was performed and was negative except as noted in the HPI.  --------------------------------------------------------------------------------------------------  Physical Exam: There were no vitals taken for  this visit.  General:  *** HEENT: No conjunctival pallor or scleral icterus. Moist mucous membranes.  OP clear. Neck: Supple without lymphadenopathy, thyromegaly, JVD, or HJR. No carotid bruit. Lungs: Normal work of breathing. Clear to auscultation bilaterally without wheezes or crackles. Heart: Regular rate and rhythm without murmurs, rubs, or gallops. Non-displaced PMI. Abd: Bowel sounds present. Soft, NT/ND without hepatosplenomegaly Ext: No lower extremity edema. Radial, PT, and DP pulses are 2+ bilaterally. Skin: Warm and dry without rash.  EKG:  ***  Lab Results  Component Value Date   WBC 9.1 04/05/2018   HGB 16.4 (H) 04/05/2018   HCT 50.3 (H) 04/05/2018   MCV 97.7 04/05/2018   PLT 179 04/05/2018    Lab Results  Component Value Date   NA 141 10/27/2018   K 3.5 10/27/2018   CL 106 10/27/2018   CO2 25 10/27/2018   BUN 11 10/27/2018   CREATININE 0.94 10/27/2018   GLUCOSE 128 (H) 10/27/2018   ALT 29 06/20/2018    Lab Results  Component Value Date   CHOL 130 06/20/2018   HDL 36 (L) 06/20/2018   LDLCALC 55 06/20/2018   TRIG 196 (H) 06/20/2018   CHOLHDL 3.6 06/20/2018    --------------------------------------------------------------------------------------------------  ASSESSMENT AND PLAN: Harrell Gave Marshayla Mitschke, MD 11/27/2018 6:30 AM

## 2018-12-13 ENCOUNTER — Other Ambulatory Visit: Payer: Self-pay

## 2018-12-13 ENCOUNTER — Ambulatory Visit (INDEPENDENT_AMBULATORY_CARE_PROVIDER_SITE_OTHER): Payer: 59 | Admitting: Internal Medicine

## 2018-12-13 ENCOUNTER — Encounter: Payer: Self-pay | Admitting: Internal Medicine

## 2018-12-13 VITALS — BP 110/60 | HR 72 | Ht 65.0 in | Wt 214.5 lb

## 2018-12-13 DIAGNOSIS — I5042 Chronic combined systolic (congestive) and diastolic (congestive) heart failure: Secondary | ICD-10-CM

## 2018-12-13 DIAGNOSIS — J449 Chronic obstructive pulmonary disease, unspecified: Secondary | ICD-10-CM

## 2018-12-13 DIAGNOSIS — I4819 Other persistent atrial fibrillation: Secondary | ICD-10-CM | POA: Diagnosis not present

## 2018-12-13 DIAGNOSIS — K7460 Unspecified cirrhosis of liver: Secondary | ICD-10-CM

## 2018-12-13 DIAGNOSIS — R188 Other ascites: Secondary | ICD-10-CM

## 2018-12-13 NOTE — Progress Notes (Signed)
Follow-up Outpatient Visit Date: 12/13/2018  Primary Care Provider: Ezequiel Kayser, MD [place] [facility] [place] [address]  Chief Complaint: Follow-up heart failure and a-fib  HPI:  Ariana Riggs is a 58 y.o. year-old female with history of systolic heart failure diagnosed in 03/2018 due to presumed tachycardia mediated cardiomyopathy in the setting of atrial fibrillation with rapid ventricular response and subsequent normalization of LVEF, paroxysmal atrial fibrillation, remote ASD repair, cirrhosis, bipolar disorder, obstructive sleep apnea, and polysubstance abuse, who presents for follow-up of atrial fibrillation and cardiomyopathy.  I last saw her in late August, at which time she reported progressive shortness of breath and abdominal distention.  Abdominal ultrasound ordered by hepatology showed only trace ascites.  She also noted sporadic chest pain.  Recommended increasing furosemide to 80 mg every morning and 40 mg every afternoon.  Further work-up of chest pain was deferred, given low risk stress test earlier this year.  Today, Ariana Riggs reports that she is feeling very well.  Her leg swelling has resolved and her abdominal distention is also much improved.  She denies chest pain, shortness of breath, palpitations, and lightheadedness.  She was diagnosed with COPD exacerbation earlier this month and just completed a 10-day course of antibiotics this weekend.  Her breathing is back to baseline.  --------------------------------------------------------------------------------------------------  Cardiovascular History & Procedures: Cardiovascular Problems:  Systolic heart failure (presumed nonischemic with low-risk MPI - 04/2018)  Paroxysmal atrial fibrillation  ASD s/p repair (~1980)  Risk Factors:  Hyperlipidemia, obesity, and history of polysubstance abuse  Cath/PCI:  None  CV Surgery:  None  EP Procedures and Devices:  None  Non-Invasive  Evaluation(s):  Echocardiogram (06/20/2018): Mildly dilated left ventricle with upper normal wall thickness. LVEF 55-60% with grade 2 diastolic dysfunction and elevated filling pressure. Moderately enlarged right ventricle with normal contraction. Mildly elevated RVSP (35 to 40 mmHg). Moderate left atrial and mild right atrial enlargement. Moderate mitral and tricuspid regurgitation.  Pharmacologic MPI (04/28/2018): Small in size, mild in severity, fixed apical inferior defect most likely representing attenuation artifact. No ischemia. LVEF 55%.  Echocardiogram (03/28/2018): Normal LV size with LVEF of 30-35%. Moderately dilated RV with moderately reduced systolic function. Moderate to severe biatrial enlargement. Moderate to severe atrial regurgitation. Severe tricuspid regurgitation.  Recent CV Pertinent Labs: Lab Results  Component Value Date   CHOL 130 06/20/2018   HDL 36 (L) 06/20/2018   LDLCALC 55 06/20/2018   TRIG 196 (H) 06/20/2018   CHOLHDL 3.6 06/20/2018   INR 1.1 09/21/2018   BNP 701.0 (H) 04/03/2018   K 3.5 10/27/2018   MG 2.2 06/20/2018   BUN 11 10/27/2018   BUN 14 10/13/2018   CREATININE 0.94 10/27/2018    Past medical and surgical history were reviewed and updated in EPIC.  No outpatient medications have been marked as taking for the 12/13/18 encounter (Appointment) with Alastor Kneale, Harrell Gave, MD.    Allergies: Doxycycline, Paxil [paroxetine hcl], Penicillins, and Sulfa antibiotics  Social History   Tobacco Use  . Smoking status: Former Smoker    Packs/day: 0.25    Years: 40.00    Pack years: 10.00    Types: Cigarettes    Quit date: 03/13/2018    Years since quitting: 0.7  . Smokeless tobacco: Never Used  Substance Use Topics  . Alcohol use: Not Currently    Alcohol/week: 4.0 standard drinks    Types: 4 Cans of beer per week    Comment: stopped 5 weeks ago  . Drug use:  Not Currently    Types: "Crack" cocaine    Comment: quit 10 years ago    Family  History  Problem Relation Age of Onset  . Hypertension Mother   . Diabetes Mother   . Peripheral Artery Disease Mother   . Dementia Father   . Hypertension Father   . Peripheral Artery Disease Sister   . Breast cancer Neg Hx     Review of Systems: A 12-system review of systems was performed and was negative except as noted in the HPI.  --------------------------------------------------------------------------------------------------  Physical Exam: There were no vitals taken for this visit.  General:  NAD. HEENT: No conjunctival pallor or scleral icterus. Facemask in place. Neck: Supple without lymphadenopathy, thyromegaly, JVD, or HJR. Lungs: Normal work of breathing. Clear to auscultation bilaterally without wheezes or crackles. Heart: Regular rate and rhythm without murmurs, rubs, or gallops. Non-displaced PMI. Abd: Bowel sounds present. Soft, NT/ND without hepatosplenomegaly Ext: No lower extremity edema. Radial, PT, and DP pulses are 2+ bilaterally. Skin: Warm and dry without rash.  EKG:  NSR with lateral T-wave inversions and non-specific ST segment changes.  Borderline QT prolongation.  No significant change since prior tracing on 10/13/2018.  Lab Results  Component Value Date   WBC 9.1 04/05/2018   HGB 16.4 (H) 04/05/2018   HCT 50.3 (H) 04/05/2018   MCV 97.7 04/05/2018   PLT 179 04/05/2018    Lab Results  Component Value Date   NA 141 10/27/2018   K 3.5 10/27/2018   CL 106 10/27/2018   CO2 25 10/27/2018   BUN 11 10/27/2018   CREATININE 0.94 10/27/2018   GLUCOSE 128 (H) 10/27/2018   ALT 29 06/20/2018    Lab Results  Component Value Date   CHOL 130 06/20/2018   HDL 36 (L) 06/20/2018   LDLCALC 55 06/20/2018   TRIG 196 (H) 06/20/2018   CHOLHDL 3.6 06/20/2018    --------------------------------------------------------------------------------------------------  ASSESSMENT AND PLAN: Persistent atrial fibrillation: Patient maintaining sinus rhythm.   Continue current regimen of apixaban and amiodarone, as benefits of continued amiodarone use were felt to outweigh benefits per EP.  Other than elevated alkaline phosphatase, LFTs were normal on last check by hepatology in August.  TSH normal on last check in December.  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  Her edema and dyspnea have resolved since our last visit.  We will continue furosemide 80 mg every morning and 40 mg every afternoon.  Additionally, we will continue current doses of losartan, metoprolol succinate, and spironolactone.  COPD: Recent COPD exacerbation with breathing back to baseline.  Further management per PCP and pulmonology.  Cirrhosis: Continue management per GI.  Follow-up: Return to clinic in 3 months.  Nelva Bush, MD 12/13/2018 6:39 AM

## 2018-12-13 NOTE — Patient Instructions (Signed)
Medication Instructions:  Your physician recommends that you continue on your current medications as directed. Please refer to the Current Medication list given to you today.  *If you need a refill on your cardiac medications before your next appointment, please call your pharmacy*  Lab Work: None If you have labs (blood work) drawn today and your tests are completely normal, you will receive your results only by: Marland Kitchen MyChart Message (if you have MyChart) OR . A paper copy in the mail If you have any lab test that is abnormal or we need to change your treatment, we will call you to review the results.  Testing/Procedures: NONE  Follow-Up: At [facility], you and your health needs are our priority.  As part of our continuing mission to provide you with exceptional heart care, we have created designated Provider Care Teams.  These Care Teams include your primary Cardiologist (physician) and Advanced Practice Providers (APPs -  Physician Assistants and Nurse Practitioners) who all work together to provide you with the care you need, when you need it.  Your next appointment:   3 months  The format for your next appointment:   In Person  Provider:    You may see Nelva Bush, MD or one of the following Advanced Practice Providers on your designated Care Team:    Murray Hodgkins, NP  Christell Faith, PA-C  Marrianne Mood, PA-C

## 2019-01-08 ENCOUNTER — Other Ambulatory Visit (HOSPITAL_COMMUNITY): Payer: Self-pay | Admitting: Gastroenterology

## 2019-01-08 ENCOUNTER — Other Ambulatory Visit: Payer: Self-pay | Admitting: Gastroenterology

## 2019-01-08 DIAGNOSIS — K7460 Unspecified cirrhosis of liver: Secondary | ICD-10-CM

## 2019-01-15 ENCOUNTER — Ambulatory Visit
Admission: RE | Admit: 2019-01-15 | Discharge: 2019-01-15 | Disposition: A | Discharge status: Home / Self Care | Payer: 59 | Source: Ambulatory Visit | Attending: Gastroenterology | Admitting: Gastroenterology

## 2019-01-15 ENCOUNTER — Other Ambulatory Visit: Payer: Self-pay

## 2019-01-15 DIAGNOSIS — K7460 Unspecified cirrhosis of liver: Secondary | ICD-10-CM | POA: Insufficient documentation

## 2019-01-23 ENCOUNTER — Ambulatory Visit: Payer: 59 | Admitting: Family

## 2019-03-15 NOTE — Progress Notes (Signed)
Follow-up Outpatient Visit Date: 03/16/2019  Primary Care Provider: Ezequiel Kayser, MD [address] [place] [address]  Chief Complaint: Chest pain  HPI:  [name] is a 59 y.o. female with history of chronic systolic heart failure due to presumed tachycardia mediated cardiomyopathy in the setting of atrial fibrillation with rapid ventricular response with subsequent normalization of LVEF, persistent atrial fibrillation, remote ASD repair, cirrhosis, bipolar disorder, obstructive sleep apnea, and polysubstance abuse, who presents for follow-up of atrial fibrillation and cardiomyopathy.  I last saw [name] in late October, at which time she was feeling well with resolution of leg edema and improvement in abdominal distention after we had previously increased furosemide to 80 mg every morning and 40 mg every afternoon.  Today, [name] reports that she has been feeling relatively well.  However, she has experienced 3 episodes of brief chest pain at rest since we last saw each other.  She describes that as if several prongs were pushing into the lower part of her chest.  The maximal intensity is 5/10, with the pain resolving spontaneously after 5 minutes.  There are no clear precipitating factors.  There are no associated symptoms.  Leg swelling has been well controlled.  She denies shortness of breath, palpitations, and lightheadedness.  She has not been active, as diffuse achiness as well as more localized pain in the left groin have impaired her walking.  She denies bleeding, remaining on apixaban.  --------------------------------------------------------------------------------------------------  Cardiovascular History & Procedures: Cardiovascular Problems:  Systolic heart failure (presumed nonischemic with low-risk MPI - 04/2018)  Paroxysmal atrial fibrillation  ASD s/p repair (~1980)  Risk Factors:  Hyperlipidemia, obesity, and history of polysubstance  abuse  Cath/PCI:  None  CV Surgery:  None  EP Procedures and Devices:  None  Non-Invasive Evaluation(s):  Echocardiogram (06/20/2018): Mildly dilated left ventricle with upper normal wall thickness. LVEF 55-60% with grade 2 diastolic dysfunction and elevated filling pressure. Moderately enlarged right ventricle with normal contraction. Mildly elevated RVSP (35 to 40 mmHg). Moderate left atrial and mild right atrial enlargement. Moderate mitral and tricuspid regurgitation.  Pharmacologic MPI (04/28/2018): Small in size, mild in severity, fixed apical inferior defect most likely representing attenuation artifact. No ischemia. LVEF 55%.  Echocardiogram (03/28/2018): Normal LV size with LVEF of 30-35%. Moderately dilated RV with moderately reduced systolic function. Moderate to severe biatrial enlargement. Moderate to severe atrial regurgitation. Severe tricuspid regurgitation.  Recent CV Pertinent Labs: Lab Results  Component Value Date   CHOL 130 06/20/2018   HDL 36 (L) 06/20/2018   LDLCALC 55 06/20/2018   TRIG 196 (H) 06/20/2018   CHOLHDL 3.6 06/20/2018   INR 1.1 09/21/2018   BNP 701.0 (H) 04/03/2018   K 3.5 10/27/2018   MG 2.2 06/20/2018   BUN 11 10/27/2018   BUN 14 10/13/2018   CREATININE 0.94 10/27/2018    Past medical and surgical history were reviewed and updated in EPIC.  Current Meds  Medication Sig  . albuterol (PROVENTIL HFA;VENTOLIN HFA) 108 (90 Base) MCG/ACT inhaler Inhale into the lungs every 6 (six) hours as needed for wheezing or shortness of breath.  Marland Kitchen albuterol (PROVENTIL) (2.5 MG/3ML) 0.083% nebulizer solution Inhale 3 mLs into the lungs every 6 (six) hours as needed for wheezing.  Marland Kitchen allopurinol (ZYLOPRIM) 300 MG tablet Take 0.5 tablets (150 mg total) by mouth daily. (Patient taking differently: Take 300 mg by mouth daily. )  . amiodarone (PACERONE) 100 MG tablet Take 1 tablet (100 mg total)  by mouth daily.  Marland Kitchen apixaban (ELIQUIS) 5 MG TABS  tablet Take 1 tablet (5 mg total) by mouth 2 (two) times daily.  Marland Kitchen atorvastatin (LIPITOR) 10 MG tablet Take 1 tablet (10 mg total) by mouth daily.  . citalopram (CELEXA) 20 MG tablet Take 20 mg by mouth daily.  . furosemide (LASIX) 40 MG tablet Take 80 mg (2 tablets) by mouth in the morning and take 40 mg (1 tablet) by mouth in the afternoon.  . lactulose (CHRONULAC) 10 GM/15ML solution 60 g 2 (two) times a day.   . lansoprazole (PREVACID) 30 MG capsule Take 30 mg by mouth daily at 12 noon.  Marland Kitchen LORazepam (ATIVAN) 1 MG tablet TAKE 1 TABLET BY MOUTH EVERY MORNING AND EVERY EVENING AS DIRECTED  . losartan (COZAAR) 25 MG tablet Take 1 tablet (25 mg total) by mouth daily.  . metoprolol succinate (TOPROL-XL) 25 MG 24 hr tablet Take 0.5 tablets (12.5 mg total) by mouth daily.  . montelukast (SINGULAIR) 10 MG tablet Take 10 mg by mouth Nightly.  . multivitamin-iron-minerals-folic acid (CENTRUM) chewable tablet Chew 1 tablet by mouth daily.  . potassium chloride (KLOR-CON) 20 MEQ packet Take 20 mEq by mouth 2 (two) times daily.  Marland Kitchen spironolactone (ALDACTONE) 25 MG tablet Take 1 tablet (25 mg total) by mouth daily.  . Suvorexant (BELSOMRA) 10 MG TABS TAKE 1/2   1 TABLET BY MOUTH AT BEDTIME AT THE SAME TIME EVERY NIGHT  . umeclidinium-vilanterol (ANORO ELLIPTA) 62.5-25 MCG/INH AEPB Inhale 1 puff into the lungs daily.  Marland Kitchen XIFAXAN 550 MG TABS tablet Take 550 mg by mouth 2 (two) times daily.  . [DISCONTINUED] amiodarone (PACERONE) 200 MG tablet Take 1 tablet (200 mg total) by mouth daily.    Allergies: Doxycycline, Paxil [paroxetine hcl], Penicillins, and Sulfa antibiotics  Social History   Tobacco Use  . Smoking status: Former Smoker    Packs/day: 0.25    Years: 40.00    Pack years: 10.00    Types: Cigarettes    Quit date: 03/13/2018    Years since quitting: 1.0  . Smokeless tobacco: Never Used  Substance Use Topics  . Alcohol use: Not Currently    Alcohol/week: 4.0 standard drinks    Types: 4 Cans  of beer per week    Comment: stopped 5 weeks ago  . Drug use: Not Currently    Types: "Crack" cocaine    Comment: quit 10 years ago    Family History  Problem Relation Age of Onset  . Hypertension Mother   . Diabetes Mother   . Peripheral Artery Disease Mother   . Dementia Father   . Hypertension Father   . Peripheral Artery Disease Sister   . Breast cancer Neg Hx     Review of Systems: A 12-system review of systems was performed and was negative except as noted in the HPI.  --------------------------------------------------------------------------------------------------  Physical Exam: BP (!) 120/58 (BP Location: Left Arm, Patient Position: Sitting, Cuff Size: Normal)   Pulse 70   Ht 5\' 6"  (1.676 m)   Wt 225 lb 8 oz (102.3 kg)   SpO2 97%   BMI 36.40 kg/m   General: NAD. HEENT: No conjunctival pallor or scleral icterus. Facemask in place. Neck: Supple without lymphadenopathy, thyromegaly, JVD, or HJR. Lungs: Normal work of breathing. Clear to auscultation bilaterally without wheezes or crackles. Heart: Regular rate and rhythm without murmurs, rubs, or gallops.  Unable to assess PMI due to body habitus. Abd: Bowel sounds present. Soft, NT/ND.  Unable  to assess HSM due to body habitus. Ext: No lower extremity edema. Radial, PT, and DP pulses are 2+ bilaterally. Skin: Warm and dry without rash.  EKG: Normal sinus rhythm with incomplete right bundle branch block, anterolateral T wave inversions, and QT prolongation.  Anterolateral T wave inversions are more pronounced than on prior tracing from 12/13/2018.  QT has also lengthened.  Lab Results  Component Value Date   WBC 9.1 04/05/2018   HGB 16.4 (H) 04/05/2018   HCT 50.3 (H) 04/05/2018   MCV 97.7 04/05/2018   PLT 179 04/05/2018    Lab Results  Component Value Date   NA 141 10/27/2018   K 3.5 10/27/2018   CL 106 10/27/2018   CO2 25 10/27/2018   BUN 11 10/27/2018   CREATININE 0.94 10/27/2018   GLUCOSE 128 (H)  10/27/2018   ALT 29 06/20/2018    Lab Results  Component Value Date   CHOL 130 06/20/2018   HDL 36 (L) 06/20/2018   LDLCALC 55 06/20/2018   TRIG 196 (H) 06/20/2018   CHOLHDL 3.6 06/20/2018    --------------------------------------------------------------------------------------------------  ASSESSMENT AND PLAN: Persistent atrial fibrillation: Ms. Fluke is maintaining sinus rhythm without symptoms to suggest recurrence of atrial fibrillation.  Given underlying liver disease and mildly elevated TSH, we have agreed to decrease amiodarone to 100 mg daily.  Previous EP consult suggested that long-term antiarrhythmic therapy would be beneficial if deemed safe by GI.  Continue anticoagulation with apixaban.  Continue monitoring of mildly abnormal TSH per Dr. Dorthula Perfect.  Chronic systolic heart failure with recovered ejection fraction: Ms. Hladky appears euvolemic on exam, though her weight is up.  Most recent CMP in 12/2018 showed stable renal function and potassium.  We will continue current doses of furosemide, metoprolol succinate, losartan, and spironolactone.  Borderline low blood pressure precludes further escalation at this time.  I encouraged weight loss through diet and exercise.  Hyperlipidemia: LDL well controlled.  Continue statin therapy as managed by PCP.  Chest pain: Discomfort is atypical, as it is transient and nonexertional.  Myocardial perfusion stress test last year was low risk.  EKG shows slightly more pronounced anterolateral T wave inversions, though these findings are nonspecific.  We have agreed to defer additional testing at this time, though Ms. Hieronymus should let us know if she has worsening chest pain or develops other concerning symptoms.  QT prolongation: Previously noted but slightly longer today (QTc 507 ms).  We will decrease amiodarone, as above.  QT prolonging medications should be avoided.  Follow-up: Return to clinic in 4 months.  Nelva Bush,  MD 03/16/2019 10:57 AM

## 2019-03-16 ENCOUNTER — Encounter: Payer: Self-pay | Admitting: Internal Medicine

## 2019-03-16 ENCOUNTER — Ambulatory Visit (INDEPENDENT_AMBULATORY_CARE_PROVIDER_SITE_OTHER): Payer: 59 | Admitting: Internal Medicine

## 2019-03-16 ENCOUNTER — Other Ambulatory Visit: Payer: Self-pay

## 2019-03-16 VITALS — BP 120/58 | HR 70 | Ht 66.0 in | Wt 225.5 lb

## 2019-03-16 DIAGNOSIS — R0789 Other chest pain: Secondary | ICD-10-CM | POA: Diagnosis not present

## 2019-03-16 DIAGNOSIS — E782 Mixed hyperlipidemia: Secondary | ICD-10-CM

## 2019-03-16 DIAGNOSIS — I5022 Chronic systolic (congestive) heart failure: Secondary | ICD-10-CM

## 2019-03-16 DIAGNOSIS — I4819 Other persistent atrial fibrillation: Secondary | ICD-10-CM | POA: Diagnosis not present

## 2019-03-16 DIAGNOSIS — E785 Hyperlipidemia, unspecified: Secondary | ICD-10-CM | POA: Insufficient documentation

## 2019-03-16 DIAGNOSIS — R9431 Abnormal electrocardiogram [ECG] [EKG]: Secondary | ICD-10-CM

## 2019-03-16 MED ORDER — AMIODARONE HCL 100 MG PO TABS: 100.0000 mg | ORAL_TABLET | Freq: Every day | ORAL | 1 refills | Status: DC

## 2019-03-16 NOTE — Patient Instructions (Signed)
Medication Instructions:  Your physician has recommended you make the following change in your medication:  1- DECREASE Amiodarone to 100 mg by mouth once a day.  *If you need a refill on your cardiac medications before your next appointment, please call your pharmacy*  Lab Work: none If you have labs (blood work) drawn today and your tests are completely normal, you will receive your results only by: Marland Kitchen MyChart Message (if you have MyChart) OR . A paper copy in the mail If you have any lab test that is abnormal or we need to change your treatment, we will call you to review the results.  Testing/Procedures: none  Follow-Up: At [facility], you and your health needs are our priority.  As part of our continuing mission to provide you with exceptional heart care, we have created designated Provider Care Teams.  These Care Teams include your primary Cardiologist (physician) and Advanced Practice Providers (APPs -  Physician Assistants and Nurse Practitioners) who all work together to provide you with the care you need, when you need it.  Your next appointment:   4 month(s)  The format for your next appointment:   In Person  Provider:    You may see Nelva Bush, MD or one of the following Advanced Practice Providers on your designated Care Team:    Murray Hodgkins, NP  Christell Faith, PA-C  Marrianne Mood, PA-C

## 2019-04-11 ENCOUNTER — Other Ambulatory Visit: Payer: Self-pay | Admitting: Gastroenterology

## 2019-04-11 DIAGNOSIS — K7460 Unspecified cirrhosis of liver: Secondary | ICD-10-CM

## 2019-04-18 ENCOUNTER — Ambulatory Visit
Admission: RE | Admit: 2019-04-18 | Discharge: 2019-04-18 | Disposition: A | Discharge status: Home / Self Care | Payer: 59 | Source: Ambulatory Visit | Attending: Gastroenterology | Admitting: Gastroenterology

## 2019-04-18 ENCOUNTER — Other Ambulatory Visit: Payer: Self-pay

## 2019-04-18 DIAGNOSIS — K7460 Unspecified cirrhosis of liver: Secondary | ICD-10-CM | POA: Diagnosis present

## 2019-04-19 ENCOUNTER — Other Ambulatory Visit: Payer: Self-pay

## 2019-04-19 MED ORDER — ATORVASTATIN CALCIUM 10 MG PO TABS: 10.0000 mg | ORAL_TABLET | Freq: Every day | ORAL | 3 refills | Status: DC

## 2019-05-04 ENCOUNTER — Ambulatory Visit: Payer: 59 | Attending: Internal Medicine

## 2019-05-04 DIAGNOSIS — Z23 Encounter for immunization: Secondary | ICD-10-CM

## 2019-05-04 NOTE — Progress Notes (Signed)
   Covid-19 Vaccination Clinic  Name:  Ariana Riggs    MRN: [id number] DOB: Oct 08, 1960  05/04/2019  [name] was observed post Covid-19 immunization for 15 minutes without incident. She was provided with Vaccine Information Sheet and instruction to access the V-Safe system.   [name] was instructed to call 911 with any severe reactions post vaccine: Marland Kitchen Difficulty breathing  . Swelling of face and throat  . A fast heartbeat  . A bad rash all over body  . Dizziness and weakness   Immunizations Administered    Name Date Dose VIS Date Route   Pfizer COVID-19 Vaccine 05/04/2019  8:49 AM 0.3 mL 01/26/2019 Intramuscular   Manufacturer: [place]   Lot: [id number]   [place]: [id number]

## 2019-05-29 ENCOUNTER — Ambulatory Visit: Payer: 59 | Attending: Internal Medicine

## 2019-05-29 DIAGNOSIS — Z23 Encounter for immunization: Secondary | ICD-10-CM

## 2019-05-29 NOTE — Progress Notes (Signed)
   Covid-19 Vaccination Clinic  Name:  Shadavia Muffoletto    MRN: [id number] DOB: [birth year]  05/29/2019  Ms. Carmosino was observed post Covid-19 immunization for 15 minutes without incident. She was provided with Vaccine Information Sheet and instruction to access the V-Safe system.   Ms. Kareem was instructed to call 911 with any severe reactions post vaccine: Marland Kitchen Difficulty breathing  . Swelling of face and throat  . A fast heartbeat  . A bad rash all over body  . Dizziness and weakness   Immunizations Administered    Name Date Dose VIS Date Route   Pfizer COVID-19 Vaccine 05/29/2019  9:15 AM 0.3 mL 01/26/2019 Intramuscular   Manufacturer: [place]   Lot: [id number]   [place]: [id number]

## 2019-06-15 ENCOUNTER — Encounter: Payer: Self-pay | Admitting: Internal Medicine

## 2019-06-15 ENCOUNTER — Telehealth: Payer: Self-pay | Admitting: Internal Medicine

## 2019-06-15 ENCOUNTER — Inpatient Hospital Stay: Payer: 59

## 2019-06-15 ENCOUNTER — Ambulatory Visit (INDEPENDENT_AMBULATORY_CARE_PROVIDER_SITE_OTHER): Payer: 59 | Admitting: Internal Medicine

## 2019-06-15 ENCOUNTER — Inpatient Hospital Stay
Admission: AD | Admit: 2019-06-15 | Discharge: 2019-06-19 | DRG: 247 | Disposition: A | Discharge status: Home / Self Care | Payer: 59 | Attending: Internal Medicine | Admitting: Internal Medicine

## 2019-06-15 ENCOUNTER — Other Ambulatory Visit: Payer: Self-pay

## 2019-06-15 VITALS — BP 138/62 | HR 66 | Ht 66.0 in | Wt 225.0 lb

## 2019-06-15 DIAGNOSIS — Z880 Allergy status to penicillin: Secondary | ICD-10-CM

## 2019-06-15 DIAGNOSIS — I428 Other cardiomyopathies: Secondary | ICD-10-CM | POA: Diagnosis present

## 2019-06-15 DIAGNOSIS — Z79899 Other long term (current) drug therapy: Secondary | ICD-10-CM | POA: Diagnosis not present

## 2019-06-15 DIAGNOSIS — Z7901 Long term (current) use of anticoagulants: Secondary | ICD-10-CM | POA: Diagnosis not present

## 2019-06-15 DIAGNOSIS — I200 Unstable angina: Secondary | ICD-10-CM | POA: Diagnosis present

## 2019-06-15 DIAGNOSIS — K219 Gastro-esophageal reflux disease without esophagitis: Secondary | ICD-10-CM | POA: Diagnosis present

## 2019-06-15 DIAGNOSIS — I5033 Acute on chronic diastolic (congestive) heart failure: Secondary | ICD-10-CM | POA: Diagnosis not present

## 2019-06-15 DIAGNOSIS — R188 Other ascites: Secondary | ICD-10-CM | POA: Diagnosis not present

## 2019-06-15 DIAGNOSIS — I509 Heart failure, unspecified: Secondary | ICD-10-CM

## 2019-06-15 DIAGNOSIS — Z6835 Body mass index (BMI) 35.0-35.9, adult: Secondary | ICD-10-CM | POA: Diagnosis not present

## 2019-06-15 DIAGNOSIS — E782 Mixed hyperlipidemia: Secondary | ICD-10-CM | POA: Diagnosis present

## 2019-06-15 DIAGNOSIS — E669 Obesity, unspecified: Secondary | ICD-10-CM | POA: Diagnosis present

## 2019-06-15 DIAGNOSIS — Z20822 Contact with and (suspected) exposure to covid-19: Secondary | ICD-10-CM | POA: Diagnosis present

## 2019-06-15 DIAGNOSIS — K7460 Unspecified cirrhosis of liver: Secondary | ICD-10-CM | POA: Diagnosis present

## 2019-06-15 DIAGNOSIS — G4733 Obstructive sleep apnea (adult) (pediatric): Secondary | ICD-10-CM | POA: Diagnosis present

## 2019-06-15 DIAGNOSIS — I480 Paroxysmal atrial fibrillation: Secondary | ICD-10-CM | POA: Diagnosis present

## 2019-06-15 DIAGNOSIS — I110 Hypertensive heart disease with heart failure: Secondary | ICD-10-CM | POA: Diagnosis present

## 2019-06-15 DIAGNOSIS — I5032 Chronic diastolic (congestive) heart failure: Secondary | ICD-10-CM | POA: Diagnosis present

## 2019-06-15 DIAGNOSIS — K7031 Alcoholic cirrhosis of liver with ascites: Secondary | ICD-10-CM | POA: Diagnosis present

## 2019-06-15 DIAGNOSIS — J449 Chronic obstructive pulmonary disease, unspecified: Secondary | ICD-10-CM | POA: Diagnosis present

## 2019-06-15 DIAGNOSIS — M1A9XX0 Chronic gout, unspecified, without tophus (tophi): Secondary | ICD-10-CM | POA: Diagnosis present

## 2019-06-15 DIAGNOSIS — Z881 Allergy status to other antibiotic agents status: Secondary | ICD-10-CM | POA: Diagnosis not present

## 2019-06-15 DIAGNOSIS — I5031 Acute diastolic (congestive) heart failure: Secondary | ICD-10-CM | POA: Diagnosis not present

## 2019-06-15 DIAGNOSIS — I4819 Other persistent atrial fibrillation: Secondary | ICD-10-CM

## 2019-06-15 DIAGNOSIS — Z8249 Family history of ischemic heart disease and other diseases of the circulatory system: Secondary | ICD-10-CM

## 2019-06-15 DIAGNOSIS — M1990 Unspecified osteoarthritis, unspecified site: Secondary | ICD-10-CM | POA: Diagnosis present

## 2019-06-15 DIAGNOSIS — I5023 Acute on chronic systolic (congestive) heart failure: Secondary | ICD-10-CM

## 2019-06-15 DIAGNOSIS — Z0184 Encounter for antibody response examination: Secondary | ICD-10-CM

## 2019-06-15 DIAGNOSIS — Z972 Presence of dental prosthetic device (complete) (partial): Secondary | ICD-10-CM | POA: Diagnosis not present

## 2019-06-15 DIAGNOSIS — F319 Bipolar disorder, unspecified: Secondary | ICD-10-CM | POA: Diagnosis present

## 2019-06-15 DIAGNOSIS — I10 Essential (primary) hypertension: Secondary | ICD-10-CM | POA: Diagnosis not present

## 2019-06-15 DIAGNOSIS — I25110 Atherosclerotic heart disease of native coronary artery with unstable angina pectoris: Secondary | ICD-10-CM | POA: Diagnosis present

## 2019-06-15 DIAGNOSIS — Z87891 Personal history of nicotine dependence: Secondary | ICD-10-CM

## 2019-06-15 DIAGNOSIS — E785 Hyperlipidemia, unspecified: Secondary | ICD-10-CM | POA: Diagnosis present

## 2019-06-15 DIAGNOSIS — Z882 Allergy status to sulfonamides status: Secondary | ICD-10-CM

## 2019-06-15 LAB — BASIC METABOLIC PANEL
Anion gap: 11 (ref 5–15)
BUN: 13 mg/dL (ref 6–20)
CO2: 28 mmol/L (ref 22–32)
Calcium: 9.4 mg/dL (ref 8.9–10.3)
Chloride: 102 mmol/L (ref 98–111)
Creatinine, Ser: 0.88 mg/dL (ref 0.44–1.00)
GFR calc Af Amer: >60 mL/min (ref >60)
GFR calc non Af Amer: >60 mL/min (ref >60)
Glucose, Bld: 122 mg/dL — ABNORMAL HIGH (ref 70–99)
Potassium: 3.5 mmol/L (ref 3.5–5.1)
Sodium: 141 mmol/L (ref 135–145)

## 2019-06-15 LAB — CBC WITH DIFFERENTIAL/PLATELET
Abs Immature Granulocytes: 0.01 10*3/uL (ref 0.00–0.07)
Basophils Absolute: 0 10*3/uL (ref 0.0–0.1)
Basophils Relative: 0 %
Eosinophils Absolute: 0.1 10*3/uL (ref 0.0–0.5)
Eosinophils Relative: 2 %
HCT: 37.3 % (ref 36.0–46.0)
Hemoglobin: 13.4 g/dL (ref 12.0–15.0)
Immature Granulocytes: 0 %
Lymphocytes Relative: 35 %
Lymphs Abs: 2.6 10*3/uL (ref 0.7–4.0)
MCH: 32.4 pg (ref 26.0–34.0)
MCHC: 35.9 g/dL (ref 30.0–36.0)
MCV: 90.1 fL (ref 80.0–100.0)
Monocytes Absolute: 0.5 10*3/uL (ref 0.1–1.0)
Monocytes Relative: 7 %
Neutro Abs: 4.3 10*3/uL (ref 1.7–7.7)
Neutrophils Relative %: 56 %
Platelets: 174 10*3/uL (ref 150–400)
RBC: 4.14 MIL/uL (ref 3.87–5.11)
RDW: 14.4 % (ref 11.5–15.5)
WBC: 7.6 10*3/uL (ref 4.0–10.5)
nRBC: 0 % (ref 0.0–0.2)

## 2019-06-15 LAB — HIV ANTIBODY (ROUTINE TESTING W REFLEX): HIV Screen 4th Generation wRfx: NONREACTIVE

## 2019-06-15 LAB — RESPIRATORY PANEL BY RT PCR (FLU A&B, COVID)
Influenza A by PCR: NEGATIVE
Influenza B by PCR: NEGATIVE
SARS Coronavirus 2 by RT PCR: NEGATIVE

## 2019-06-15 LAB — TROPONIN I (HIGH SENSITIVITY)
Troponin I (High Sensitivity): 6 ng/L (ref <18)
Troponin I (High Sensitivity): 6 ng/L (ref <18)

## 2019-06-15 LAB — BRAIN NATRIURETIC PEPTIDE: B Natriuretic Peptide: 137 pg/mL — ABNORMAL HIGH (ref 0.0–100.0)

## 2019-06-15 LAB — PROTIME-INR
INR: 1.2 (ref 0.8–1.2)
Prothrombin Time: 14.6 seconds (ref 11.4–15.2)

## 2019-06-15 LAB — HEPARIN LEVEL (UNFRACTIONATED): Heparin Unfractionated: 1.62 IU/mL — ABNORMAL HIGH (ref 0.30–0.70)

## 2019-06-15 LAB — APTT: aPTT: 38 seconds — ABNORMAL HIGH (ref 24–36)

## 2019-06-15 MED ORDER — SUVOREXANT 10 MG PO TABS: 5.0000 mg | ORAL_TABLET | Freq: Every day | ORAL | Status: DC

## 2019-06-15 MED ORDER — SPIRONOLACTONE 25 MG PO TABS
25.0000 mg | ORAL_TABLET | Freq: Every day | ORAL | Status: DC
  Administered 2019-06-16 – 2019-06-19 (×3): 25 mg via ORAL
  Filled 2019-06-15 (×3): qty 1

## 2019-06-15 MED ORDER — LOSARTAN POTASSIUM 25 MG PO TABS
25.0000 mg | ORAL_TABLET | Freq: Every day | ORAL | Status: DC
  Administered 2019-06-16 – 2019-06-19 (×3): 25 mg via ORAL
  Filled 2019-06-15 (×3): qty 1

## 2019-06-15 MED ORDER — METOPROLOL SUCCINATE ER 25 MG PO TB24
12.5000 mg | ORAL_TABLET | Freq: Every day | ORAL | Status: DC
  Administered 2019-06-16 – 2019-06-19 (×3): 12.5 mg via ORAL
  Filled 2019-06-15 (×3): qty 1

## 2019-06-15 MED ORDER — NITROGLYCERIN 0.4 MG SL SUBL: 0.4000 mg | SUBLINGUAL_TABLET | SUBLINGUAL | Status: DC | PRN

## 2019-06-15 MED ORDER — PANTOPRAZOLE SODIUM 40 MG PO TBEC
40.0000 mg | DELAYED_RELEASE_TABLET | Freq: Every day | ORAL | Status: DC
  Administered 2019-06-16 – 2019-06-19 (×4): 40 mg via ORAL
  Filled 2019-06-15 (×4): qty 1

## 2019-06-15 MED ORDER — MELATONIN 5 MG PO TABS
5.0000 mg | ORAL_TABLET | Freq: Every day | ORAL | Status: DC
  Filled 2019-06-15: qty 1

## 2019-06-15 MED ORDER — RIFAXIMIN 550 MG PO TABS
550.0000 mg | ORAL_TABLET | Freq: Two times a day (BID) | ORAL | Status: DC
  Administered 2019-06-15 – 2019-06-19 (×7): 550 mg via ORAL
  Filled 2019-06-15 (×9): qty 1

## 2019-06-15 MED ORDER — HEPARIN BOLUS VIA INFUSION
3000.0000 [IU] | Freq: Once | INTRAVENOUS | Status: AC
  Administered 2019-06-15: 18:00:00 3000 [IU] via INTRAVENOUS
  Filled 2019-06-15: qty 3000

## 2019-06-15 MED ORDER — CENTRUM PO CHEW
1.0000 | CHEWABLE_TABLET | Freq: Every day | ORAL | Status: DC
  Filled 2019-06-15: qty 1

## 2019-06-15 MED ORDER — ALLOPURINOL 300 MG PO TABS
150.0000 mg | ORAL_TABLET | Freq: Every day | ORAL | Status: DC
  Administered 2019-06-16 – 2019-06-19 (×3): 150 mg via ORAL
  Filled 2019-06-15 (×4): qty 0.5

## 2019-06-15 MED ORDER — ASPIRIN 81 MG PO CHEW
81.0000 mg | CHEWABLE_TABLET | Freq: Every day | ORAL | Status: DC
  Administered 2019-06-15 – 2019-06-18 (×4): 81 mg via ORAL
  Filled 2019-06-15 (×3): qty 1

## 2019-06-15 MED ORDER — FUROSEMIDE 10 MG/ML IJ SOLN
60.0000 mg | Freq: Two times a day (BID) | INTRAMUSCULAR | Status: DC
  Administered 2019-06-15 – 2019-06-16 (×2): 60 mg via INTRAVENOUS
  Filled 2019-06-15 (×2): qty 6

## 2019-06-15 MED ORDER — POTASSIUM CHLORIDE 20 MEQ PO PACK
20.0000 meq | PACK | Freq: Two times a day (BID) | ORAL | Status: DC
  Administered 2019-06-15 – 2019-06-19 (×7): 20 meq via ORAL
  Filled 2019-06-15 (×7): qty 1

## 2019-06-15 MED ORDER — UMECLIDINIUM-VILANTEROL 62.5-25 MCG/INH IN AEPB
1.0000 | INHALATION_SPRAY | Freq: Every day | RESPIRATORY_TRACT | Status: DC
  Administered 2019-06-16 – 2019-06-19 (×3): 1 via RESPIRATORY_TRACT
  Filled 2019-06-15: qty 14

## 2019-06-15 MED ORDER — ACETAMINOPHEN 325 MG PO TABS: 650.0000 mg | ORAL_TABLET | ORAL | Status: DC | PRN

## 2019-06-15 MED ORDER — FLUTICASONE PROPIONATE 50 MCG/ACT NA SUSP
2.0000 | Freq: Two times a day (BID) | NASAL | Status: DC
  Administered 2019-06-15 – 2019-06-19 (×4): 2 via NASAL
  Filled 2019-06-15: qty 16

## 2019-06-15 MED ORDER — AMIODARONE HCL 200 MG PO TABS
100.0000 mg | ORAL_TABLET | Freq: Every day | ORAL | Status: DC
  Administered 2019-06-16 – 2019-06-19 (×3): 100 mg via ORAL
  Filled 2019-06-15 (×3): qty 1

## 2019-06-15 MED ORDER — LORAZEPAM 1 MG PO TABS
1.0000 mg | ORAL_TABLET | Freq: Two times a day (BID) | ORAL | Status: DC
  Administered 2019-06-15 – 2019-06-19 (×7): 1 mg via ORAL
  Filled 2019-06-15 (×7): qty 1

## 2019-06-15 MED ORDER — MONTELUKAST SODIUM 10 MG PO TABS
10.0000 mg | ORAL_TABLET | Freq: Every day | ORAL | Status: DC
  Administered 2019-06-15 – 2019-06-18 (×4): 10 mg via ORAL
  Filled 2019-06-15 (×4): qty 1

## 2019-06-15 MED ORDER — ALBUTEROL SULFATE (2.5 MG/3ML) 0.083% IN NEBU: 3.0000 mL | INHALATION_SOLUTION | Freq: Four times a day (QID) | RESPIRATORY_TRACT | Status: DC | PRN

## 2019-06-15 MED ORDER — LACTULOSE 10 GM/15ML PO SOLN
60.0000 g | Freq: Two times a day (BID) | ORAL | Status: DC
  Administered 2019-06-16: 60 g via ORAL
  Filled 2019-06-15 (×4): qty 90

## 2019-06-15 MED ORDER — SODIUM CHLORIDE 0.9% FLUSH
3.0000 mL | Freq: Two times a day (BID) | INTRAVENOUS | Status: DC
  Administered 2019-06-15 – 2019-06-18 (×6): 3 mL via INTRAVENOUS

## 2019-06-15 MED ORDER — ATORVASTATIN CALCIUM 10 MG PO TABS
10.0000 mg | ORAL_TABLET | Freq: Every day | ORAL | Status: DC
  Administered 2019-06-16 – 2019-06-19 (×3): 10 mg via ORAL
  Filled 2019-06-15 (×3): qty 1

## 2019-06-15 MED ORDER — HEPARIN (PORCINE) 25000 UT/250ML-% IV SOLN
1350.0000 [IU]/h | INTRAVENOUS | Status: DC
  Administered 2019-06-15: 950 [IU]/h via INTRAVENOUS
  Administered 2019-06-16 – 2019-06-17 (×2): 1350 [IU]/h via INTRAVENOUS
  Filled 2019-06-15 (×4): qty 250

## 2019-06-15 MED ORDER — ALBUTEROL SULFATE HFA 108 (90 BASE) MCG/ACT IN AERS: 2.0000 | INHALATION_SPRAY | Freq: Four times a day (QID) | RESPIRATORY_TRACT | Status: DC | PRN

## 2019-06-15 MED ORDER — CITALOPRAM HYDROBROMIDE 20 MG PO TABS
20.0000 mg | ORAL_TABLET | Freq: Every day | ORAL | Status: DC
  Filled 2019-06-15 (×2): qty 1

## 2019-06-15 MED ORDER — DOXYLAMINE SUCCINATE (SLEEP) 25 MG PO TABS
25.0000 mg | ORAL_TABLET | Freq: Every evening | ORAL | Status: DC | PRN
  Administered 2019-06-15 – 2019-06-18 (×4): 25 mg via ORAL
  Filled 2019-06-15 (×6): qty 1

## 2019-06-15 MED ORDER — ONDANSETRON HCL 4 MG/2ML IJ SOLN: 4.0000 mg | Freq: Four times a day (QID) | INTRAMUSCULAR | Status: DC | PRN

## 2019-06-15 NOTE — Telephone Encounter (Signed)
Patient added to open DOD slot today at 320

## 2019-06-15 NOTE — Progress Notes (Addendum)
CRITICAL VALUE ALERT  Critical Value: Covid  Antigen reactive   Date & Time Notied:  06-15-19 at 2030  Provider Notified: Stark Klein NP  Orders Received/Actions taken: PCR ordered. Recent vaccine taken by patient   06-15-19 at 2240: PCR Negative

## 2019-06-15 NOTE — Plan of Care (Signed)
  Problem: Education: Goal: Understanding of CV disease, CV risk reduction, and recovery process will improve Outcome: Not Progressing Goal: Individualized Educational Video(s) Outcome: Not Progressing   Problem: Activity: Goal: Ability to return to baseline activity level will improve Outcome: Not Progressing   Problem: Cardiovascular: Goal: Ability to achieve and maintain adequate cardiovascular perfusion will improve Outcome: Not Progressing Goal: Vascular access site(s) Level 0-1 will be maintained Outcome: Not Progressing   Problem: Health Behavior/Discharge Planning: Goal: Ability to safely manage health-related needs after discharge will improve Outcome: Not Progressing

## 2019-06-15 NOTE — Telephone Encounter (Signed)
Pt c/o of Chest Pain: STAT if CP now or developed within 24 hours  1. Are you having CP right now? No   2. Are you experiencing any other symptoms (ex. SOB, nausea, vomiting, sweating)?  sob feels like heart is not pumping well   3. How long have you been experiencing CP? Single episode yesterday   4. Is your CP continuous or coming and going? One episode   5. Have you taken Nitroglycerin? No  ?

## 2019-06-15 NOTE — Progress Notes (Signed)
   06/15/19 1944  ReDS Vest / Clip  Station Marker C  Ruler Value 28  ReDS Value Range (!) > 40  ReDS Actual Value 41
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
## 2019-06-15 NOTE — Progress Notes (Signed)
Follow-up Outpatient Visit Date: 06/15/2019  Primary Care Provider: Ezequiel Kayser, MD [place] [address]  Chief Complaint: Chest pain  HPI:  Ariana Riggs is a 59 y.o. female with history of chronic systolic heart failure due to presumed tachycardia mediated cardiomyopathy in the setting of atrial fibrillation with rapid ventricular response with subsequent normalization of LVEF, persistent atrial fibrillation, remote ASD repair, cirrhosis, bipolar disorder, obstructive sleep apnea, and polysubstance abuse, who presents for urgent evaluation of chest pain.  I last saw Ariana Riggs in January, at which time she was doing relatively well other than 3 episodes of brief, atypical chest pain.  She had been doing well up until about a month ago when she began to notice worsening orthopnea and tightness in her chest when lying in bed.  It would often awaken her around 4:56 in the morning.  She would get up and walk around with improvement in symptoms.  Yesterday afternoon, she had sudden onset of severe substernal chest pain that she describes as a sharp sensation that would come and go.  It lasted for several hours until she ultimately fell asleep around 930.  This morning, the chest pain had resolved, though she felt tired and short of breath.  Currently, she is chest pain-free.  Ariana Riggs has not had any palpitations, lightheadedness, or edema.  She reports having had lost some weight but has gained it back.  She denies bleeding and remains compliant with her medications, including apixaban.  --------------------------------------------------------------------------------------------------  Cardiovascular History & Procedures: Cardiovascular Problems:  Systolic heart failure (presumed nonischemic with low-risk MPI - 04/2018)  Paroxysmal atrial fibrillation  ASD s/p repair (~1980)  Risk Factors:  Hyperlipidemia, obesity, and history of polysubstance abuse   Cath/PCI:  None  CV Surgery:  None  EP Procedures and Devices:  None  Non-Invasive Evaluation(s):  Echocardiogram (06/20/2018): Mildly dilated left ventricle with upper normal wall thickness. LVEF 55-60% with grade 2 diastolic dysfunction and elevated filling pressure. Moderately enlarged right ventricle with normal contraction. Mildly elevated RVSP (35 to 40 mmHg). Moderate left atrial and mild right atrial enlargement. Moderate mitral and tricuspid regurgitation.  Pharmacologic MPI (04/28/2018): Small in size, mild in severity, fixed apical inferior defect most likely representing attenuation artifact. No ischemia. LVEF 55%.  Echocardiogram (03/28/2018): Normal LV size with LVEF of 30-35%. Moderately dilated RV with moderately reduced systolic function. Moderate to severe biatrial enlargement. Moderate to severe atrial regurgitation. Severe tricuspid regurgitation.  Recent CV Pertinent Labs: Lab Results  Component Value Date   CHOL 130 06/20/2018   HDL 36 (L) 06/20/2018   LDLCALC 55 06/20/2018   TRIG 196 (H) 06/20/2018   CHOLHDL 3.6 06/20/2018   INR 1.1 09/21/2018   BNP 701.0 (H) 04/03/2018   K 3.5 10/27/2018   MG 2.2 06/20/2018   BUN 11 10/27/2018   BUN 14 10/13/2018   CREATININE 0.94 10/27/2018    Past medical and surgical history were reviewed and updated in EPIC.  Current Meds  Medication Sig  . albuterol (PROVENTIL HFA;VENTOLIN HFA) 108 (90 Base) MCG/ACT inhaler Inhale into the lungs every 6 (six) hours as needed for wheezing or shortness of breath.  Marland Kitchen albuterol (PROVENTIL) (2.5 MG/3ML) 0.083% nebulizer solution Inhale 3 mLs into the lungs every 6 (six) hours as needed for wheezing.  Marland Kitchen allopurinol (ZYLOPRIM) 300 MG tablet Take 0.5 tablets (150 mg total) by mouth daily. (Patient taking differently: Take 300 mg by mouth daily. )  . amiodarone (PACERONE) 100 MG  tablet Take 1 tablet (100 mg total) by mouth daily.  Marland Kitchen apixaban (ELIQUIS) 5 MG TABS tablet  Take 1 tablet (5 mg total) by mouth 2 (two) times daily.  Marland Kitchen atorvastatin (LIPITOR) 10 MG tablet Take 1 tablet (10 mg total) by mouth daily.  . furosemide (LASIX) 40 MG tablet Take 80 mg (2 tablets) by mouth in the morning and take 40 mg (1 tablet) by mouth in the afternoon.  . lactulose (CHRONULAC) 10 GM/15ML solution 60 g 2 (two) times a day.   . lansoprazole (PREVACID) 30 MG capsule Take 30 mg by mouth daily at 12 noon.  Marland Kitchen LORazepam (ATIVAN) 1 MG tablet TAKE 1 TABLET BY MOUTH EVERY MORNING AND EVERY EVENING AS DIRECTED  . losartan (COZAAR) 25 MG tablet Take 1 tablet (25 mg total) by mouth daily.  . metoprolol succinate (TOPROL-XL) 25 MG 24 hr tablet Take 0.5 tablets (12.5 mg total) by mouth daily.  . montelukast (SINGULAIR) 10 MG tablet Take 10 mg by mouth Nightly.  . multivitamin-iron-minerals-folic acid (CENTRUM) chewable tablet Chew 1 tablet by mouth daily.  . potassium chloride (KLOR-CON) 20 MEQ packet Take 20 mEq by mouth 2 (two) times daily.  Marland Kitchen spironolactone (ALDACTONE) 25 MG tablet Take 1 tablet (25 mg total) by mouth daily.  . Suvorexant (BELSOMRA) 10 MG TABS TAKE 1/2   1 TABLET BY MOUTH AT BEDTIME AT THE SAME TIME EVERY NIGHT  . umeclidinium-vilanterol (ANORO ELLIPTA) 62.5-25 MCG/INH AEPB Inhale 1 puff into the lungs daily.  Marland Kitchen XIFAXAN 550 MG TABS tablet Take 550 mg by mouth 2 (two) times daily.    Allergies: Doxycycline, Paxil [paroxetine hcl], Penicillins, and Sulfa antibiotics  Social History   Tobacco Use  . Smoking status: Former Smoker    Packs/day: 0.25    Years: 40.00    Pack years: 10.00    Types: Cigarettes    Quit date: 03/13/2018    Years since quitting: 1.2  . Smokeless tobacco: Never Used  Substance Use Topics  . Alcohol use: Not Currently    Alcohol/week: 4.0 standard drinks    Types: 4 Cans of beer per week    Comment: stopped 5 weeks ago  . Drug use: Not Currently    Types: "Crack" cocaine    Comment: quit 10 years ago    Family History  Problem  Relation Age of Onset  . Hypertension Mother   . Diabetes Mother   . Peripheral Artery Disease Mother   . Dementia Father   . Hypertension Father   . Peripheral Artery Disease Sister   . Breast cancer Neg Hx     Review of Systems: A 12-system review of systems was performed and was negative except as noted in the HPI.  --------------------------------------------------------------------------------------------------  Physical Exam: BP 138/62 (BP Location: Left Arm, Patient Position: Sitting, Cuff Size: Normal)   Pulse 66   Ht 5\' 6"  (1.676 m)   Wt 225 lb (102.1 kg)   SpO2 98%   BMI 36.32 kg/m   General: NAD. Neck: No JVD or HJR, though body habitus limits evaluation. Lungs: Mildly diminished breath sounds throughout without wheezes or crackles. Heart: Distant heart sounds.  Regular rate and rhythm without murmurs. Abdomen: Mildly distended but nontender. Extremities: No lower extremity edema.  EKG: Normal sinus rhythm with PACs and anterolateral ST depressions and T wave inversions.  QT has shortened since 03/16/2019.  Otherwise, no significant interval change.  Lab Results  Component Value Date   WBC 9.1 04/05/2018   HGB 16.4 (  H) 04/05/2018   HCT 50.3 (H) 04/05/2018   MCV 97.7 04/05/2018   PLT 179 04/05/2018    Lab Results  Component Value Date   NA 141 10/27/2018   K 3.5 10/27/2018   CL 106 10/27/2018   CO2 25 10/27/2018   BUN 11 10/27/2018   CREATININE 0.94 10/27/2018   GLUCOSE 128 (H) 10/27/2018   ALT 29 06/20/2018    Lab Results  Component Value Date   CHOL 130 06/20/2018   HDL 36 (L) 06/20/2018   LDLCALC 55 06/20/2018   TRIG 196 (H) 06/20/2018   CHOLHDL 3.6 06/20/2018   --------------------------------------------------------------------------------------------------  ASSESSMENT AND PLAN: Unstable angina: I am concerned that Ariana Riggs's chest pain may reflect unstable angina.  She has anterolateral ST/T changes on her EKG, which were present in  12-Mar-2022 but are more pronounced compared with last year.  Myocardial perfusion stress test last year was low risk.  I have recommended direct admission to [facility] for monitoring and medical therapy.  She will likely need cardiac catheterization on Monday to better define her coronary anatomy.  We will need to hold apixaban for at least 48 hours.  I have reviewed the risks, indications, and alternatives to cardiac catheterization, possible angioplasty, and stenting with the patient. Risks include but are not limited to bleeding, infection, vascular injury, stroke, myocardial infection, arrhythmia, kidney injury, radiation-related injury in the case of prolonged fluoroscopy use, emergency cardiac surgery, and death. The patient understands the risks of serious complication is 1-2 in [id number] with diagnostic cardiac cath and 1-2% or less with angioplasty/stenting.  Acute on chronic HFrEF: Ariana Riggs endorses worsening orthopnea and exertional dyspnea over the last month.  Other than mild abdominal distention, she appears fairly euvolemic.  Her weight is unchanged since 03/12/22.  I think it would be worthwhile to gently diurese her and consider repeating a limited echo to ensure that her LVEF has not declined.  Persistent atrial fibrillation: Ariana Riggs is maintaining sinus rhythm.  We will continue low-dose amiodarone.  Apixaban should be held and heparin infusion started when next apixaban dose is due to allow for catheterization next week.  Cirrhosis: Mild abdominal distention noted today.  Continue spironolactone, furosemide, and rifaximin.  Follow-up: To be determined based on hospital course.  Nelva Bush, MD 06/15/2019 3:31 PM

## 2019-06-15 NOTE — Patient Instructions (Addendum)
Medication Instructions:  Your physician recommends that you continue on your current medications as directed. Please refer to the Current Medication list given to you today.  *If you need a refill on your cardiac medications before your next appointment, please call your pharmacy*  Follow-Up: At [facility], you and your health needs are our priority.  As part of our continuing mission to provide you with exceptional heart care, we have created designated Provider Care Teams.  These Care Teams include your primary Cardiologist (physician) and Advanced Practice Providers (APPs -  Physician Assistants and Nurse Practitioners) who all work together to provide you with the care you need, when you need it.   Other Instructions: You will be admitted to [facility].
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
## 2019-06-15 NOTE — Progress Notes (Signed)
Pt requests sleep medications. Ouma notified, stated she will put in order for trazodone.

## 2019-06-15 NOTE — Telephone Encounter (Signed)
Pt seen by Dr. Saunders Revel today. See ov note for further detail.

## 2019-06-16 ENCOUNTER — Inpatient Hospital Stay (HOSPITAL_COMMUNITY)
Admission: AD | Admit: 2019-06-16 | Discharge: 2019-06-16 | Disposition: A | Discharge status: Home / Self Care | Payer: 59 | Source: Ambulatory Visit | Attending: Internal Medicine | Admitting: Internal Medicine

## 2019-06-16 DIAGNOSIS — G4733 Obstructive sleep apnea (adult) (pediatric): Secondary | ICD-10-CM

## 2019-06-16 DIAGNOSIS — I5023 Acute on chronic systolic (congestive) heart failure: Secondary | ICD-10-CM

## 2019-06-16 DIAGNOSIS — I5031 Acute diastolic (congestive) heart failure: Secondary | ICD-10-CM

## 2019-06-16 LAB — SAR COV2 SEROLOGY (COVID19)AB(IGG),IA: SARS-CoV-2 Ab, IgG: REACTIVE — AB

## 2019-06-16 LAB — BASIC METABOLIC PANEL
Anion gap: 11 (ref 5–15)
BUN: 13 mg/dL (ref 6–20)
CO2: 29 mmol/L (ref 22–32)
Calcium: 8.9 mg/dL (ref 8.9–10.3)
Chloride: 102 mmol/L (ref 98–111)
Creatinine, Ser: 1.03 mg/dL — ABNORMAL HIGH (ref 0.44–1.00)
GFR calc Af Amer: >60 mL/min (ref >60)
GFR calc non Af Amer: 59 mL/min — ABNORMAL LOW (ref >60)
Glucose, Bld: 134 mg/dL — ABNORMAL HIGH (ref 70–99)
Potassium: 3.5 mmol/L (ref 3.5–5.1)
Sodium: 142 mmol/L (ref 135–145)

## 2019-06-16 LAB — CBC
HCT: 36.7 % (ref 36.0–46.0)
Hemoglobin: 13 g/dL (ref 12.0–15.0)
MCH: 32.2 pg (ref 26.0–34.0)
MCHC: 35.4 g/dL (ref 30.0–36.0)
MCV: 90.8 fL (ref 80.0–100.0)
Platelets: 160 10*3/uL (ref 150–400)
RBC: 4.04 MIL/uL (ref 3.87–5.11)
RDW: 14.4 % (ref 11.5–15.5)
WBC: 6.4 10*3/uL (ref 4.0–10.5)
nRBC: 0 % (ref 0.0–0.2)

## 2019-06-16 LAB — LIPID PANEL
Cholesterol: 116 mg/dL (ref 0–200)
HDL: 32 mg/dL — ABNORMAL LOW
LDL Cholesterol: 38 mg/dL (ref 0–99)
Total CHOL/HDL Ratio: 3.6 ratio
Triglycerides: 231 mg/dL — ABNORMAL HIGH
VLDL: 46 mg/dL — ABNORMAL HIGH (ref 0–40)

## 2019-06-16 LAB — ECHOCARDIOGRAM LIMITED
Height: 66 in
Weight: 3577.6 [oz_av]

## 2019-06-16 LAB — APTT
aPTT: 53 seconds — ABNORMAL HIGH (ref 24–36)
aPTT: 51 seconds — ABNORMAL HIGH (ref 24–36)
aPTT: 68 seconds — ABNORMAL HIGH (ref 24–36)
aPTT: 76 seconds — ABNORMAL HIGH (ref 24–36)

## 2019-06-16 MED ORDER — ADULT MULTIVITAMIN W/MINERALS CH
1.0000 | ORAL_TABLET | Freq: Every day | ORAL | Status: DC
  Administered 2019-06-16 – 2019-06-19 (×3): 1 via ORAL
  Filled 2019-06-16 (×3): qty 1

## 2019-06-16 MED ORDER — FUROSEMIDE 40 MG PO TABS
80.0000 mg | ORAL_TABLET | Freq: Every day | ORAL | Status: DC
  Administered 2019-06-17: 80 mg via ORAL
  Filled 2019-06-16: qty 2

## 2019-06-16 MED ORDER — FUROSEMIDE 40 MG PO TABS
40.0000 mg | ORAL_TABLET | Freq: Every evening | ORAL | Status: DC
  Administered 2019-06-16 – 2019-06-17 (×2): 40 mg via ORAL
  Filled 2019-06-16 (×2): qty 1

## 2019-06-16 MED ORDER — HEPARIN BOLUS VIA INFUSION
1500.0000 [IU] | Freq: Once | INTRAVENOUS | Status: AC
  Administered 2019-06-16: 1500 [IU] via INTRAVENOUS
  Filled 2019-06-16: qty 1500
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
## 2019-06-16 NOTE — Progress Notes (Signed)
PROGRESS NOTE    Ariana Riggs  [id number] DOB: 03-09-1960 DOA: 06/15/2019 PCP: Ezequiel Kayser, MD    No chief complaint on file.   Brief Narrative:  59 year old obese female with COPD, OSA on CPAP, A. fib on Eliquis, bipolar disorder, asthma, cirrhosis of liver, tachycardia induced cardiomyopathy (normalized EF on last echo), prior history tobacco, alcohol and cocaine use (currently in remission) sent from cardiologist office where she presented with chest tightness and T wave inversion on anterolateral leads on EKG. Admitted for unstable angina with plan on cardiac cath on 5/3.  Assessment & Plan:   Principal Problem:   Unstable angina [facility]) Denies further chest pain symptoms since admission.  Serial troponin negative.  EKG repeated today again showing ST depression in anterolateral leads.  Continue IV heparin, daily aspirin, beta-blocker and statin. 2D echo done today and results pending.  Cardiology following with plan on cardiac cath on 5/3.  Active Problems: Acute on chronic diastolic CHF (HCC) Reported almost 20 pound weight gain in the past few months with mild abdominal ascites on exam on admission.  Received 2 doses of IV Lasix 60 mg and has diuresed quite well.  Given some worsening renal function she has been placed back on her home p.o. Lasix regimen.  Strict I's/O and daily weight.  Continue losartan, beta-blocker and statin.    Chronic obstructive pulmonary disease (HCC) Stable.  Continue home inhaler     Hepatic cirrhosis (HCC) Currently stable.  Reports quitting alcohol over 15 months back.    Persistent atrial fibrillation (HCC) Rate controlled.  Continue amiodarone and beta-blocker.  Eliquis held for cardiac cath and on IV heparin.  OSA Nighttime CPAP   DVT prophylaxis: IV heparin Code Status: Full code Family Communication: None at bedside Disposition:   Status is: Inpatient  Remains inpatient appropriate because:Ongoing diagnostic testing  needed not appropriate for outpatient work up.  Continues to require IV heparin with plan on inpatient cardiac cath.  Unsafe discharge home due to unstable angina.   Dispo: The patient is from: Home              Anticipated d/c is to: Home              Anticipated d/c date is: 2 days              Patient currently is not medically stable to d/c.        Consultants:   Cardiology  Procedures: 2D echo  Antimicrobials: None   Subjective: Seen and examined.  Denies further chest pain or shortness of breath.  Objective: Vitals:   06/15/19 1956 06/16/19 0427 06/16/19 0801 06/16/19 1158  BP: 119/60 116/75 120/89 (!) 106/59  Pulse: 72 71 60 73  Resp: 20 20 18 18   Temp: 98.4 F (36.9 C) 98.1 F (36.7 C) 98 F (36.7 C)   TempSrc: Oral Oral Oral   SpO2: 94% 90% 92% 93%  Weight:  101.4 kg      Intake/Output Summary (Last 24 hours) at 06/16/2019 1318 Last data filed at 06/16/2019 1039 Gross per 24 hour  Intake 226.31 ml  Output 2400 ml  Net -2173.69 ml   Filed Weights   06/16/19 0427  Weight: 101.4 kg    Examination:  Middle-aged female not in distress HEENT: Moist mucosa, supple neck Chest: Clear bilaterally CVs: Normal S1-S2, no murmurs GI: Soft, improved abdominal distention seen yesterday, nontender Musculoskeletal: Warm, no edema    Data Reviewed: I have personally reviewed following labs and  imaging studies  CBC: Recent Labs  Lab 06/15/19 1720 06/16/19 0101  WBC 7.6 6.4  NEUTROABS 4.3  --   HGB 13.4 13.0  HCT 37.3 36.7  MCV 90.1 90.8  PLT 174 0000000    Basic Metabolic Panel: Recent Labs  Lab 06/15/19 1720 06/16/19 0101  NA 141 142  K 3.5 3.5  CL 102 102  CO2 28 29  GLUCOSE 122* 134*  BUN 13 13  CREATININE 0.88 1.03*  CALCIUM 9.4 8.9    GFR: Estimated Creatinine Clearance: 70.7 mL/min (A) (by C-G formula based on SCr of 1.03 mg/dL (H)).  Liver Function Tests: No results for input(s): AST, ALT, ALKPHOS, BILITOT, PROT, ALBUMIN in the last  168 hours.  CBG: No results for input(s): GLUCAP in the last 168 hours.   Recent Results (from the past 240 hour(s))  Respiratory Panel by RT PCR (Flu A&B, Covid) - Nasopharyngeal Swab     Status: None   Collection Time: 06/15/19  9:25 PM   Specimen: Nasopharyngeal Swab  Result Value Ref Range Status   SARS Coronavirus 2 by RT PCR NEGATIVE NEGATIVE Final    Comment: (NOTE) SARS-CoV-2 target nucleic acids are NOT DETECTED. The SARS-CoV-2 RNA is generally detectable in upper respiratoy specimens during the acute phase of infection. The lowest concentration of SARS-CoV-2 viral copies this assay can detect is 131 copies/mL. A negative result does not preclude SARS-Cov-2 infection and should not be used as the sole basis for treatment or other patient management decisions. A negative result may occur with  improper specimen collection/handling, submission of specimen other than nasopharyngeal swab, presence of viral mutation(s) within the areas targeted by this assay, and inadequate number of viral copies (<131 copies/mL). A negative result must be combined with clinical observations, patient history, and epidemiological information. The expected result is Negative. Fact Sheet for Patients:  PinkCheek.be Fact Sheet for Healthcare Providers:  GravelBags.it This test is not yet ap proved or cleared by the [place] FDA and  has been authorized for detection and/or diagnosis of SARS-CoV-2 by FDA under an Emergency Use Authorization (EUA). This EUA will remain  in effect (meaning this test can be used) for the duration of the COVID-19 declaration under Section 564(b)(1) of the Act, 21 U.S.C. section 360bbb-3(b)(1), unless the authorization is terminated or revoked sooner.    Influenza A by PCR NEGATIVE NEGATIVE Final   Influenza B by PCR NEGATIVE NEGATIVE Final    Comment: (NOTE) The Xpert Xpress SARS-CoV-2/FLU/RSV assay is  intended as an aid in  the diagnosis of influenza from Nasopharyngeal swab specimens and  should not be used as a sole basis for treatment. Nasal washings and  aspirates are unacceptable for Xpert Xpress SARS-CoV-2/FLU/RSV  testing. Fact Sheet for Patients: PinkCheek.be Fact Sheet for Healthcare Providers: GravelBags.it This test is not yet approved or cleared by the [place] FDA and  has been authorized for detection and/or diagnosis of SARS-CoV-2 by  FDA under an Emergency Use Authorization (EUA). This EUA will remain  in effect (meaning this test can be used) for the duration of the  Covid-19 declaration under Section 564(b)(1) of the Act, 21  U.S.C. section 360bbb-3(b)(1), unless the authorization is  terminated or revoked. Performed at [facility], [address] [place], [place] [address]          Radiology Studies: [facility] Chest [place] 1 View  Result Date: 06/15/2019 CLINICAL DATA:  Chest pressure and shortness of breath. EXAM: PORTABLE CHEST 1 VIEW COMPARISON:  03/28/2018 FINDINGS: Lungs are adequately inflated without lobar consolidation or effusion. Subtle prominence of the perihilar vessels suggesting minimal vascular congestion. Borderline stable cardiomegaly. Remainder the exam is unchanged. IMPRESSION: Borderline stable cardiomegaly with suggestion of minimal vascular congestion. Electronically Signed   By: Marin Olp M.D.   On: 06/15/2019 18:30   ECHOCARDIOGRAM LIMITED  Result Date: 06/16/2019    ECHOCARDIOGRAM LIMITED REPORT   Patient Name:   Ariana Riggs Date of Exam: 06/16/2019 Medical Rec #:  [id number]          Height:       66.0 in Accession #:    [id number]         Weight:       223.6 lb Date of Birth:  [birth year]          BSA:          2.097 m Patient Age:    75 years           BP:           130/61 mmHg Patient Gender: F                  HR:           71 bpm. Exam Location:  [facility] Procedure:  2D Echo Indications:     CHF 428.31/ I50.31  History:         Patient has prior history of Echocardiogram examinations, most                  recent 06/20/2018.  Sonographer:     Arville Go RDCS Referring Phys:  [place] Diagnosing Phys: Skeet Latch MD IMPRESSIONS  1. Left ventricular ejection fraction, by estimation, is 55 to 60%. Left ventricular ejection fraction by PLAX is 45 %. The left ventricle has normal function. The left ventricle has no regional wall motion abnormalities. The left ventricular internal cavity size was mildly dilated. There is mild left ventricular hypertrophy.  2. Left atrial size was mildly dilated.  3. The inferior vena cava is normal in size with greater than 50% respiratory variability, suggesting right atrial pressure of 3 mmHg. FINDINGS  Left Ventricle: Left ventricular ejection fraction, by estimation, is 55 to 60%. Left ventricular ejection fraction by PLAX is 45 %. The left ventricle has normal function. The left ventricle has no regional wall motion abnormalities. The left ventricular internal cavity size was mildly dilated. There is mild left ventricular hypertrophy. Left Atrium: Left atrial size was mildly dilated. Venous: The inferior vena cava is normal in size with greater than 50% respiratory variability, suggesting right atrial pressure of 3 mmHg.  LEFT VENTRICLE PLAX 2D LV EF:         Left ventricular ejection fraction by PLAX is 45 %. LVIDd:         5.63 cm LVIDs:         4.35 cm LV PW:         1.15 cm LV IVS:        0.95 cm LVOT diam:     1.90 cm LVOT Area:     2.84 cm  LV Volumes (MOD) LV vol d, MOD A2C: 107.0 ml LV vol d, MOD A4C: 82.9 ml LV vol s, MOD A2C: 28.1 ml LV vol s, MOD A4C: 35.3 ml LV SV MOD A2C:     78.9 ml LV SV MOD A4C:     82.9 ml LV SV MOD BP:  65.4 ml RIGHT VENTRICLE RV Basal diam:  3.24 cm LEFT ATRIUM             Index       RIGHT ATRIUM           Index LA diam:        4.70 cm 2.24 cm/m  RA Area:     18.50 cm LA Vol (A2C):    70.9 ml 33.81 ml/m RA Volume:   50.40 ml  24.04 ml/m LA Vol (A4C):   63.8 ml 30.43 ml/m LA Biplane Vol: 67.1 ml 32.00 ml/m   AORTA Ao Root diam: 2.90 cm Ao Asc diam:  3.00 cm  SHUNTS Systemic Diam: 1.90 cm Skeet Latch MD Electronically signed by Skeet Latch MD Signature Date/Time: 06/16/2019/12:14:45 PM    Final         Scheduled Meds: . allopurinol  150 mg Oral Daily  . amiodarone  100 mg Oral Daily  . aspirin  81 mg Oral Daily  . atorvastatin  10 mg Oral Daily  . citalopram  20 mg Oral Daily  . fluticasone  2 spray Each Nare BID  . furosemide  40 mg Oral QPM  . [START ON 06/17/2019] furosemide  80 mg Oral Daily  . lactulose  60 g Oral BID  . LORazepam  1 mg Oral BID  . losartan  25 mg Oral Daily  . melatonin  5 mg Oral QHS  . metoprolol succinate  12.5 mg Oral Daily  . montelukast  10 mg Oral QHS  . multivitamin with minerals  1 tablet Oral Daily  . pantoprazole  40 mg Oral Daily  . potassium chloride  20 mEq Oral BID  . rifaximin  550 mg Oral BID  . sodium chloride flush  3 mL Intravenous Q12H  . spironolactone  25 mg Oral Daily  . umeclidinium-vilanterol  1 puff Inhalation Daily   Continuous Infusions: . heparin 1,350 Units/hr (06/16/19 1001)     LOS: 1 day    Time spent: 35 minutes    Rogerick Baldwin, MD [facility]   To contact the attending provider between 7A-7P or the covering provider during after hours 7P-7A, please log into the web site [email or web address] and access using universal [place] password for that web site. If you do not have the password, please call the hospital operator.  06/16/2019, 1:18 PM

## 2019-06-16 NOTE — Progress Notes (Signed)
*  PRELIMINARY RESULTS* Echocardiogram 2D Echocardiogram has been performed. A Limited Echo was requested and completed.  [place] 06/16/2019, 10:44 AM

## 2019-06-17 DIAGNOSIS — I10 Essential (primary) hypertension: Secondary | ICD-10-CM

## 2019-06-17 LAB — CBC
HCT: 38.2 % (ref 36.0–46.0)
Hemoglobin: 13.9 g/dL (ref 12.0–15.0)
MCH: 32.3 pg (ref 26.0–34.0)
MCHC: 36.4 g/dL — ABNORMAL HIGH (ref 30.0–36.0)
MCV: 88.6 fL (ref 80.0–100.0)
Platelets: 154 10*3/uL (ref 150–400)
RBC: 4.31 MIL/uL (ref 3.87–5.11)
RDW: 14.6 % (ref 11.5–15.5)
WBC: 6.1 10*3/uL (ref 4.0–10.5)
nRBC: 0 % (ref 0.0–0.2)

## 2019-06-17 LAB — BASIC METABOLIC PANEL
Anion gap: 9 (ref 5–15)
BUN: 15 mg/dL (ref 6–20)
CO2: 27 mmol/L (ref 22–32)
Calcium: 9.3 mg/dL (ref 8.9–10.3)
Chloride: 105 mmol/L (ref 98–111)
Creatinine, Ser: 1.01 mg/dL — ABNORMAL HIGH (ref 0.44–1.00)
GFR calc Af Amer: >60 mL/min (ref >60)
GFR calc non Af Amer: >60 mL/min (ref >60)
Glucose, Bld: 134 mg/dL — ABNORMAL HIGH (ref 70–99)
Potassium: 3.4 mmol/L — ABNORMAL LOW (ref 3.5–5.1)
Sodium: 141 mmol/L (ref 135–145)

## 2019-06-17 LAB — APTT: aPTT: 73 seconds — ABNORMAL HIGH (ref 24–36)

## 2019-06-17 LAB — HEPARIN LEVEL (UNFRACTIONATED): Heparin Unfractionated: 0.4 IU/mL (ref 0.30–0.70)

## 2019-06-17 NOTE — Progress Notes (Signed)
PROGRESS NOTE    Ariana Riggs  [id number] DOB: 27-May-1960 DOA: 06/15/2019 PCP: Ezequiel Kayser, MD    No chief complaint on file.   Brief Narrative:  59 year old obese female with COPD, OSA on CPAP, A. fib on Eliquis, bipolar disorder, asthma, cirrhosis of liver, tachycardia induced cardiomyopathy (normalized EF on last echo), prior history tobacco, alcohol and cocaine use (currently in remission) sent from cardiologist office where she presented with chest tightness and T wave inversion on anterolateral leads on EKG. Admitted for unstable angina with plan on cardiac cath on 5/3.  Assessment & Plan:   Principal Problem:   Unstable angina ([place]) Remains chest pain-free.  Serial troponin negative. Continue IV heparin, daily aspirin, beta-blocker and statin. 2D echo done yesterday with worsened EF of 40-45%.  Cardiology following with plan on cardiac cath on 5/3.  Active Problems: Acute on chronic diastolic CHF (HCC) Reported almost 20 pound weight gain in the past few months with mild abdominal ascites on exam on admission.  Improved diuresis with 2 dose of IV Lasix and now transition to her home p.o. regimen (80 mg a.m. and 40 mg p.m.). 2D echo shows EF of 55-60%.  No wall motion abnormality.  Monitor strict I's/O, daily weight .    Chronic obstructive pulmonary disease (HCC) Stable.  Continue home inhaler     Hepatic cirrhosis (HCC) Currently stable.  Reports quitting alcohol over 15 months back.    Persistent atrial fibrillation (HCC) Rate controlled.  Continue amiodarone and beta-blocker.  Eliquis held for cardiac cath and on IV heparin.  OSA Nighttime CPAP   DVT prophylaxis: IV heparin Code Status: Full code Family Communication: Husband at bedside Disposition:   Status is: Inpatient  Remains inpatient appropriate because:Ongoing diagnostic testing needed not appropriate for outpatient work up.  Continues to require IV heparin with plan on inpatient cardiac  cath.  Unsafe discharge home due to unstable angina.   Dispo: The patient is from: Home              Anticipated d/c is to: Home              Anticipated d/c date is: 2 days              Patient currently is not medically stable to d/c.        Consultants:   Cardiology  Procedures: 2D echo  Antimicrobials: None   Subjective: Seen and examined.  No chest pain symptoms or shortness of breath.  Objective: Vitals:   06/16/19 2041 06/17/19 0517 06/17/19 0726 06/17/19 1151  BP: 116/60 109/62 109/71 (!) 131/57  Pulse: 61 70 64 70  Resp:  19 17 18   Temp:  97.7 F (36.5 C) 98 F (36.7 C) 98 F (36.7 C)  TempSrc:  Oral    SpO2: 95% 92% 97% 90%  Weight:  99.7 kg      Intake/Output Summary (Last 24 hours) at 06/17/2019 1319 Last data filed at 06/17/2019 1109 Gross per 24 hour  Intake 480 ml  Output 2850 ml  Net -2370 ml   Filed Weights   06/16/19 0427 06/17/19 0517  Weight: 101.4 kg 99.7 kg   Physical exam Not in distress HEENT: Moist mucosa Chest: Clear CVs: Normal S1-S2 GI: Soft, nondistended, nontender Musculoskeletal: Warm no edema     Data Reviewed: I have personally reviewed following labs and imaging studies  CBC: Recent Labs  Lab 06/15/19 1720 06/16/19 0101 06/17/19 0459  WBC 7.6 6.4 6.1  NEUTROABS 4.3  --   --  HGB 13.4 13.0 13.9  HCT 37.3 36.7 38.2  MCV 90.1 90.8 88.6  PLT 174 160 [id number]    Basic Metabolic Panel: Recent Labs  Lab 06/15/19 1720 06/16/19 0101 06/17/19 0459  NA 141 142 141  K 3.5 3.5 3.4*  CL 102 102 105  CO2 28 29 27   GLUCOSE 122* 134* 134*  BUN 13 13 15   CREATININE 0.88 1.03* 1.01*  CALCIUM 9.4 8.9 9.3    GFR: Estimated Creatinine Clearance: 71.5 mL/min (A) (by C-G formula based on SCr of 1.01 mg/dL (H)).  Liver Function Tests: No results for input(s): AST, ALT, ALKPHOS, BILITOT, PROT, ALBUMIN in the last 168 hours.  CBG: No results for input(s): GLUCAP in the last 168 hours.   Recent Results (from the  past 240 hour(s))  Respiratory Panel by RT PCR (Flu A&B, Covid) - Nasopharyngeal Swab     Status: None   Collection Time: 06/15/19  9:25 PM   Specimen: Nasopharyngeal Swab  Result Value Ref Range Status   SARS Coronavirus 2 by RT PCR NEGATIVE NEGATIVE Final    Comment: (NOTE) SARS-CoV-2 target nucleic acids are NOT DETECTED. The SARS-CoV-2 RNA is generally detectable in upper respiratoy specimens during the acute phase of infection. The lowest concentration of SARS-CoV-2 viral copies this assay can detect is 131 copies/mL. A negative result does not preclude SARS-Cov-2 infection and should not be used as the sole basis for treatment or other patient management decisions. A negative result may occur with  improper specimen collection/handling, submission of specimen other than nasopharyngeal swab, presence of viral mutation(s) within the areas targeted by this assay, and inadequate number of viral copies (<131 copies/mL). A negative result must be combined with clinical observations, patient history, and epidemiological information. The expected result is Negative. Fact Sheet for Patients:  PinkCheek.be Fact Sheet for Healthcare Providers:  GravelBags.it This test is not yet ap proved or cleared by the [place] FDA and  has been authorized for detection and/or diagnosis of SARS-CoV-2 by FDA under an Emergency Use Authorization (EUA). This EUA will remain  in effect (meaning this test can be used) for the duration of the COVID-19 declaration under Section 564(b)(1) of the Act, 21 U.S.C. section 360bbb-3(b)(1), unless the authorization is terminated or revoked sooner.    Influenza A by PCR NEGATIVE NEGATIVE Final   Influenza B by PCR NEGATIVE NEGATIVE Final    Comment: (NOTE) The Xpert Xpress SARS-CoV-2/FLU/RSV assay is intended as an aid in  the diagnosis of influenza from Nasopharyngeal swab specimens and  should not be  used as a sole basis for treatment. Nasal washings and  aspirates are unacceptable for Xpert Xpress SARS-CoV-2/FLU/RSV  testing. Fact Sheet for Patients: PinkCheek.be Fact Sheet for Healthcare Providers: GravelBags.it This test is not yet approved or cleared by the [place] FDA and  has been authorized for detection and/or diagnosis of SARS-CoV-2 by  FDA under an Emergency Use Authorization (EUA). This EUA will remain  in effect (meaning this test can be used) for the duration of the  Covid-19 declaration under Section 564(b)(1) of the Act, 21  U.S.C. section 360bbb-3(b)(1), unless the authorization is  terminated or revoked. Performed at [facility], [address] [place], [place] [address]          Radiology Studies: [facility] Chest [place] 1 View  Result Date: 06/15/2019 CLINICAL DATA:  Chest pressure and shortness of breath. EXAM: PORTABLE CHEST 1 VIEW COMPARISON:  03/28/2018 FINDINGS: Lungs are adequately inflated without lobar consolidation  or effusion. Subtle prominence of the perihilar vessels suggesting minimal vascular congestion. Borderline stable cardiomegaly. Remainder the exam is unchanged. IMPRESSION: Borderline stable cardiomegaly with suggestion of minimal vascular congestion. Electronically Signed   By: Marin Olp M.D.   On: 06/15/2019 18:30   ECHOCARDIOGRAM LIMITED  Result Date: 06/16/2019    ECHOCARDIOGRAM LIMITED REPORT   Patient Name:   Ariana Riggs Date of Exam: 06/16/2019 Medical Rec #:  [id number]          Height:       66.0 in Accession #:    [id number]         Weight:       223.6 lb Date of Birth:  [birth year]          BSA:          2.097 m Patient Age:    70 years           BP:           130/61 mmHg Patient Gender: F                  HR:           71 bpm. Exam Location:  [facility] Procedure: 2D Echo Indications:     CHF 428.31/ I50.31  History:         Patient has prior history of Echocardiogram  examinations, most                  recent 06/20/2018.  Sonographer:     Arville Go RDCS Referring Phys:  [place] Diagnosing Phys: Skeet Latch MD IMPRESSIONS  1. Left ventricular ejection fraction, by estimation, is 55 to 60%. Left ventricular ejection fraction by PLAX is 45 %. The left ventricle has normal function. The left ventricle has no regional wall motion abnormalities. The left ventricular internal cavity size was mildly dilated. There is mild left ventricular hypertrophy.  2. Left atrial size was mildly dilated.  3. The inferior vena cava is normal in size with greater than 50% respiratory variability, suggesting right atrial pressure of 3 mmHg. FINDINGS  Left Ventricle: Left ventricular ejection fraction, by estimation, is 55 to 60%. Left ventricular ejection fraction by PLAX is 45 %. The left ventricle has normal function. The left ventricle has no regional wall motion abnormalities. The left ventricular internal cavity size was mildly dilated. There is mild left ventricular hypertrophy. Left Atrium: Left atrial size was mildly dilated. Venous: The inferior vena cava is normal in size with greater than 50% respiratory variability, suggesting right atrial pressure of 3 mmHg.  LEFT VENTRICLE PLAX 2D LV EF:         Left ventricular ejection fraction by PLAX is 45 %. LVIDd:         5.63 cm LVIDs:         4.35 cm LV PW:         1.15 cm LV IVS:        0.95 cm LVOT diam:     1.90 cm LVOT Area:     2.84 cm  LV Volumes (MOD) LV vol d, MOD A2C: 107.0 ml LV vol d, MOD A4C: 82.9 ml LV vol s, MOD A2C: 28.1 ml LV vol s, MOD A4C: 35.3 ml LV SV MOD A2C:     78.9 ml LV SV MOD A4C:     82.9 ml LV SV MOD BP:      65.4 ml RIGHT VENTRICLE RV Basal diam:  3.24  cm LEFT ATRIUM             Index       RIGHT ATRIUM           Index LA diam:        4.70 cm 2.24 cm/m  RA Area:     18.50 cm LA Vol (A2C):   70.9 ml 33.81 ml/m RA Volume:   50.40 ml  24.04 ml/m LA Vol (A4C):   63.8 ml 30.43 ml/m LA Biplane  Vol: 67.1 ml 32.00 ml/m   AORTA Ao Root diam: 2.90 cm Ao Asc diam:  3.00 cm  SHUNTS Systemic Diam: 1.90 cm Skeet Latch MD Electronically signed by Skeet Latch MD Signature Date/Time: 06/16/2019/12:14:45 PM    Final         Scheduled Meds:  allopurinol  150 mg Oral Daily   amiodarone  100 mg Oral Daily   aspirin  81 mg Oral Daily   atorvastatin  10 mg Oral Daily   citalopram  20 mg Oral Daily   fluticasone  2 spray Each Nare BID   furosemide  40 mg Oral QPM   furosemide  80 mg Oral Daily   lactulose  60 g Oral BID   LORazepam  1 mg Oral BID   losartan  25 mg Oral Daily   melatonin  5 mg Oral QHS   metoprolol succinate  12.5 mg Oral Daily   montelukast  10 mg Oral QHS   multivitamin with minerals  1 tablet Oral Daily   pantoprazole  40 mg Oral Daily   potassium chloride  20 mEq Oral BID   rifaximin  550 mg Oral BID   sodium chloride flush  3 mL Intravenous Q12H   spironolactone  25 mg Oral Daily   umeclidinium-vilanterol  1 puff Inhalation Daily   Continuous Infusions:  heparin 1,350 Units/hr (06/17/19 0907)     LOS: 2 days    Time spent: 35 minutes    Sameul Tagle, MD [facility]   To contact the attending provider between 7A-7P or the covering provider during after hours 7P-7A, please log into the web site [email or web address] and access using universal [place] password for that web site. If you do not have the password, please call the hospital operator.  06/17/2019, 1:19 PM
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
## 2019-06-17 NOTE — Progress Notes (Signed)
Progress Note  Patient Name: Ariana Riggs Date of Encounter: 06/17/2019  Primary Cardiologist: Nelva Bush, MD   Subjective   Feeling well.  No chest pain or shortness of breath.  She had some chest tightness overnight and this AM upon awakening.  Not using her home CPAP while in the hospital.  Reports that her GERD is well-controlled.   Inpatient Medications    Scheduled Meds: . allopurinol  150 mg Oral Daily  . amiodarone  100 mg Oral Daily  . aspirin  81 mg Oral Daily  . atorvastatin  10 mg Oral Daily  . citalopram  20 mg Oral Daily  . fluticasone  2 spray Each Nare BID  . furosemide  40 mg Oral QPM  . furosemide  80 mg Oral Daily  . lactulose  60 g Oral BID  . LORazepam  1 mg Oral BID  . losartan  25 mg Oral Daily  . melatonin  5 mg Oral QHS  . metoprolol succinate  12.5 mg Oral Daily  . montelukast  10 mg Oral QHS  . multivitamin with minerals  1 tablet Oral Daily  . pantoprazole  40 mg Oral Daily  . potassium chloride  20 mEq Oral BID  . rifaximin  550 mg Oral BID  . sodium chloride flush  3 mL Intravenous Q12H  . spironolactone  25 mg Oral Daily  . umeclidinium-vilanterol  1 puff Inhalation Daily   Continuous Infusions: . heparin 1,350 Units/hr (06/17/19 0907)   PRN Meds: acetaminophen, albuterol, doxylamine (Sleep), nitroGLYCERIN, ondansetron (ZOFRAN) IV   Vital Signs    Vitals:   06/16/19 2041 06/17/19 0517 06/17/19 0726 06/17/19 1151  BP: 116/60 109/62 109/71 (!) 131/57  Pulse: 61 70 64 70  Resp:  19 17 18   Temp:  97.7 F (36.5 C) 98 F (36.7 C) 98 F (36.7 C)  TempSrc:  Oral    SpO2: 95% 92% 97% 90%  Weight:  99.7 kg      Intake/Output Summary (Last 24 hours) at 06/17/2019 1214 Last data filed at 06/17/2019 1109 Gross per 24 hour  Intake 480 ml  Output 2850 ml  Net -2370 ml   Last 3 Weights 06/17/2019 06/16/2019 06/15/2019  Weight (lbs) 219 lb 11.2 oz 223 lb 9.6 oz 225 lb  Weight (kg) 99.655 kg 101.424 kg 102.059 kg      Telemetry    Sinus rhythm.  PVCs - Personally Reviewed  ECG    06/16/2019: Sinus rhythm.  Rate 65 bpm.  Incomplete right bundle branch block.  Anterolateral changes.  QTc 486 ms.- Personally Reviewed  Physical Exam   VS:  BP (!) 131/57 (BP Location: Right Arm)   Pulse 70   Temp 98 F (36.7 C)   Resp 18   Wt 99.7 kg   LMP  (LMP Unknown)   SpO2 90%   BMI 35.46 kg/m  , BMI Body mass index is 35.46 kg/m. GENERAL:  Well appearing HEENT: Pupils equal round and reactive, fundi not visualized, oral mucosa unremarkable NECK:  No jugular venous distention, waveform within normal limits, carotid upstroke brisk and symmetric, no bruits LUNGS:  Clear to auscultation bilaterally HEART:  RRR.  PMI not displaced or sustained,S1 and S2 within normal limits, no S3, no S4, no clicks, no rubs, no murmurs ABD:  Flat, positive bowel sounds normal in frequency in pitch, no bruits, no rebound, no guarding, no midline pulsatile mass, no hepatomegaly, no splenomegaly EXT:  2 plus pulses throughout, no edema, no  cyanosis no clubbing SKIN:  No rashes no nodules NEURO:  Cranial nerves II through XII grossly intact, motor grossly intact throughout PSYCH:  Cognitively intact, oriented to person place and time   Labs    High Sensitivity Troponin:   Recent Labs  Lab 06/15/19 1720 06/15/19 1904  TROPONINIHS 6 6      Chemistry Recent Labs  Lab 06/15/19 1720 06/16/19 0101 06/17/19 0459  NA 141 142 141  K 3.5 3.5 3.4*  CL 102 102 105  CO2 28 29 27   GLUCOSE 122* 134* 134*  BUN 13 13 15   CREATININE 0.88 1.03* 1.01*  CALCIUM 9.4 8.9 9.3  GFRNONAA >60 59* >60  GFRAA >60 >60 >60  ANIONGAP 11 11 9      Hematology Recent Labs  Lab 06/15/19 1720 06/16/19 0101 06/17/19 0459  WBC 7.6 6.4 6.1  RBC 4.14 4.04 4.31  HGB 13.4 13.0 13.9  HCT 37.3 36.7 38.2  MCV 90.1 90.8 88.6  MCH 32.4 32.2 32.3  MCHC 35.9 35.4 36.4*  RDW 14.4 14.4 14.6  PLT 174 160 154    BNP Recent Labs  Lab 06/15/19 1720  BNP 137.0*      DDimer No results for input(s): DDIMER in the last 168 hours.   Radiology    DG Chest Port 1 View  Result Date: 06/15/2019 CLINICAL DATA:  Chest pressure and shortness of breath. EXAM: PORTABLE CHEST 1 VIEW COMPARISON:  03/28/2018 FINDINGS: Lungs are adequately inflated without lobar consolidation or effusion. Subtle prominence of the perihilar vessels suggesting minimal vascular congestion. Borderline stable cardiomegaly. Remainder the exam is unchanged. IMPRESSION: Borderline stable cardiomegaly with suggestion of minimal vascular congestion. Electronically Signed   By: Marin Olp M.D.   On: 06/15/2019 18:30   ECHOCARDIOGRAM LIMITED  Result Date: 06/16/2019    ECHOCARDIOGRAM LIMITED REPORT   Patient Name:   Ariana Riggs Date of Exam: 06/16/2019 Medical Rec #:  [id number]          Height:       66.0 in Accession #:    [id number]         Weight:       223.6 lb Date of Birth:  1960-07-28          BSA:          2.097 m Patient Age:    30 years           BP:           130/61 mmHg Patient Gender: F                  HR:           71 bpm. Exam Location:  [facility] Procedure: 2D Echo Indications:     CHF 428.31/ I50.31  History:         Patient has prior history of Echocardiogram examinations, most                  recent 06/20/2018.  Sonographer:     Arville Go RDCS Referring Phys:  [place] Diagnosing Phys: Skeet Latch MD IMPRESSIONS  1. Left ventricular ejection fraction, by estimation, is 55 to 60%. Left ventricular ejection fraction by PLAX is 45 %. The left ventricle has normal function. The left ventricle has no regional wall motion abnormalities. The left ventricular internal cavity size was mildly dilated. There is mild left ventricular hypertrophy.  2. Left atrial size was mildly dilated.  3. The inferior vena cava is  normal in size with greater than 50% respiratory variability, suggesting right atrial pressure of 3 mmHg. FINDINGS  Left Ventricle: Left ventricular ejection  fraction, by estimation, is 55 to 60%. Left ventricular ejection fraction by PLAX is 45 %. The left ventricle has normal function. The left ventricle has no regional wall motion abnormalities. The left ventricular internal cavity size was mildly dilated. There is mild left ventricular hypertrophy. Left Atrium: Left atrial size was mildly dilated. Venous: The inferior vena cava is normal in size with greater than 50% respiratory variability, suggesting right atrial pressure of 3 mmHg.  LEFT VENTRICLE PLAX 2D LV EF:         Left ventricular ejection fraction by PLAX is 45 %. LVIDd:         5.63 cm LVIDs:         4.35 cm LV PW:         1.15 cm LV IVS:        0.95 cm LVOT diam:     1.90 cm LVOT Area:     2.84 cm  LV Volumes (MOD) LV vol d, MOD A2C: 107.0 ml LV vol d, MOD A4C: 82.9 ml LV vol s, MOD A2C: 28.1 ml LV vol s, MOD A4C: 35.3 ml LV SV MOD A2C:     78.9 ml LV SV MOD A4C:     82.9 ml LV SV MOD BP:      65.4 ml RIGHT VENTRICLE RV Basal diam:  3.24 cm LEFT ATRIUM             Index       RIGHT ATRIUM           Index LA diam:        4.70 cm 2.24 cm/m  RA Area:     18.50 cm LA Vol (A2C):   70.9 ml 33.81 ml/m RA Volume:   50.40 ml  24.04 ml/m LA Vol (A4C):   63.8 ml 30.43 ml/m LA Biplane Vol: 67.1 ml 32.00 ml/m   AORTA Ao Root diam: 2.90 cm Ao Asc diam:  3.00 cm  SHUNTS Systemic Diam: 1.90 cm Skeet Latch MD Electronically signed by Skeet Latch MD Signature Date/Time: 06/16/2019/12:14:45 PM    Final     Cardiac Studies   Echo 06/16/19: IMPRESSIONS   1. Left ventricular ejection fraction, by estimation, is 55 to 60%.  The left ventricle has  normal function. The left ventricle has no regional wall motion  abnormalities. The left ventricular internal  cavity size was mildly dilated. There is mild left ventricular  hypertrophy.  2. Left atrial size was mildly dilated.  3. The inferior vena cava is normal in size with greater than 50%  respiratory variability, suggesting right atrial pressure  of 3 mmHg.   Lexiscan MPI 04/2018: Pharmacological myocardial perfusion imaging study with no significant Ischemia Small fixed defect of mild intensity in the inferoapical region consistent with attenuation artifact Normal wall motion, EF estimated at 55% No EKG changes concerning for ischemia at peak stress or in recovery. Mild aortic arch calcification noted on attenuation corrected CT scan images Low risk scan  Patient Profile     59 y.o. female with chronic diastolic heart failure, prior systolic heart failure related to tachycardia mediated cardiomyopathy with subsequent improvement in LVEF, status post ASD repair, paroxysmal atrial fibrillation, cirrhosis, bipolar disorder, OSA on CPAP, and polysubstance abuse admitted with unstable angina.  Assessment & Plan    # Unstable angina: Cardiac enzymes have been negative.  Currently chest pain is somewhat atypical.  She did have an episode of accelerated chest pain prior to admission.  She has not had any recurrence of this since she has been here.  Echo was reassuring.  However EKG does show anterolateral ST abnormalities that have been seen previously.  Given her underlying risk factors and previous negative stress test, plans for left heart catheterization tomorrow.  Eliquis has been held since admission.  She continues on heparin and is chest pain-free.  Risks and benefits of cardiac catheterization have been discussed with the patient.  The patient understands that risks included but are not limited to stroke (1 in 1000), death (1 in 54), kidney failure [usually temporary] (1 in 500), bleeding (1 in 200), allergic reaction [possibly serious] (1 in 200). The patient understands and agrees to proceed.   # Chronic diastolic heart failure:  She previously had systolic dysfunction that improved with rate control.  Echo this admission was reassuring.  Continue metoprolol.  She is euvolemic on exam.  #Persistent atrial  fibrillation: Maintaining sinus rhythm.  Continue metoprolol and amiodarone.  Eliquis is on hold and she is on the 100  # Essential hypertension: Blood pressure stable  # OSA:  Patient's husband will bring her CPAP from home.  She does not hospital CPAP machine.  #Cirrhosis: Previously required paracentesis.  Volume status and abdomen are stable at this time.  Continue spironolactone, Lasix, and rifaximin.       For questions or updates, please contact [place] Please consult [email or web address] for contact info under        Signed, Skeet Latch, MD  06/17/2019, 12:14 PM

## 2019-06-17 NOTE — H&P (View-Only) (Signed)
Progress Note  Patient Name: Ariana Riggs Date of Encounter: 06/17/2019  Primary Cardiologist: Nelva Bush, MD   Subjective   Feeling well.  No chest pain or shortness of breath.  She had some chest tightness overnight and this AM upon awakening.  Not using her home CPAP while in the hospital.  Reports that her GERD is well-controlled.   Inpatient Medications    Scheduled Meds: . allopurinol  150 mg Oral Daily  . amiodarone  100 mg Oral Daily  . aspirin  81 mg Oral Daily  . atorvastatin  10 mg Oral Daily  . citalopram  20 mg Oral Daily  . fluticasone  2 spray Each Nare BID  . furosemide  40 mg Oral QPM  . furosemide  80 mg Oral Daily  . lactulose  60 g Oral BID  . LORazepam  1 mg Oral BID  . losartan  25 mg Oral Daily  . melatonin  5 mg Oral QHS  . metoprolol succinate  12.5 mg Oral Daily  . montelukast  10 mg Oral QHS  . multivitamin with minerals  1 tablet Oral Daily  . pantoprazole  40 mg Oral Daily  . potassium chloride  20 mEq Oral BID  . rifaximin  550 mg Oral BID  . sodium chloride flush  3 mL Intravenous Q12H  . spironolactone  25 mg Oral Daily  . umeclidinium-vilanterol  1 puff Inhalation Daily   Continuous Infusions: . heparin 1,350 Units/hr (06/17/19 0907)   PRN Meds: acetaminophen, albuterol, doxylamine (Sleep), nitroGLYCERIN, ondansetron (ZOFRAN) IV   Vital Signs    Vitals:   06/16/19 2041 06/17/19 0517 06/17/19 0726 06/17/19 1151  BP: 116/60 109/62 109/71 (!) 131/57  Pulse: 61 70 64 70  Resp:  19 17 18   Temp:  97.7 F (36.5 C) 98 F (36.7 C) 98 F (36.7 C)  TempSrc:  Oral    SpO2: 95% 92% 97% 90%  Weight:  99.7 kg      Intake/Output Summary (Last 24 hours) at 06/17/2019 1214 Last data filed at 06/17/2019 1109 Gross per 24 hour  Intake 480 ml  Output 2850 ml  Net -2370 ml   Last 3 Weights 06/17/2019 06/16/2019 06/15/2019  Weight (lbs) 219 lb 11.2 oz 223 lb 9.6 oz 225 lb  Weight (kg) 99.655 kg 101.424 kg 102.059 kg      Telemetry    Sinus rhythm.  PVCs - Personally Reviewed  ECG    06/16/2019: Sinus rhythm.  Rate 65 bpm.  Incomplete right bundle branch block.  Anterolateral changes.  QTc 486 ms.- Personally Reviewed  Physical Exam   VS:  BP (!) 131/57 (BP Location: Right Arm)   Pulse 70   Temp 98 F (36.7 C)   Resp 18   Wt 99.7 kg   LMP  (LMP Unknown)   SpO2 90%   BMI 35.46 kg/m  , BMI Body mass index is 35.46 kg/m. GENERAL:  Well appearing HEENT: Pupils equal round and reactive, fundi not visualized, oral mucosa unremarkable NECK:  No jugular venous distention, waveform within normal limits, carotid upstroke brisk and symmetric, no bruits LUNGS:  Clear to auscultation bilaterally HEART:  RRR.  PMI not displaced or sustained,S1 and S2 within normal limits, no S3, no S4, no clicks, no rubs, no murmurs ABD:  Flat, positive bowel sounds normal in frequency in pitch, no bruits, no rebound, no guarding, no midline pulsatile mass, no hepatomegaly, no splenomegaly EXT:  2 plus pulses throughout, no edema, no  cyanosis no clubbing SKIN:  No rashes no nodules NEURO:  Cranial nerves II through XII grossly intact, motor grossly intact throughout PSYCH:  Cognitively intact, oriented to person place and time   Labs    High Sensitivity Troponin:   Recent Labs  Lab 06/15/19 1720 06/15/19 1904  TROPONINIHS 6 6      Chemistry Recent Labs  Lab 06/15/19 1720 06/16/19 0101 06/17/19 0459  NA 141 142 141  K 3.5 3.5 3.4*  CL 102 102 105  CO2 28 29 27   GLUCOSE 122* 134* 134*  BUN 13 13 15   CREATININE 0.88 1.03* 1.01*  CALCIUM 9.4 8.9 9.3  GFRNONAA >60 59* >60  GFRAA >60 >60 >60  ANIONGAP 11 11 9      Hematology Recent Labs  Lab 06/15/19 1720 06/16/19 0101 06/17/19 0459  WBC 7.6 6.4 6.1  RBC 4.14 4.04 4.31  HGB 13.4 13.0 13.9  HCT 37.3 36.7 38.2  MCV 90.1 90.8 88.6  MCH 32.4 32.2 32.3  MCHC 35.9 35.4 36.4*  RDW 14.4 14.4 14.6  PLT 174 160 154    BNP Recent Labs  Lab 06/15/19 1720  BNP 137.0*      DDimer No results for input(s): DDIMER in the last 168 hours.   Radiology    DG Chest Port 1 View  Result Date: 06/15/2019 CLINICAL DATA:  Chest pressure and shortness of breath. EXAM: PORTABLE CHEST 1 VIEW COMPARISON:  03/28/2018 FINDINGS: Lungs are adequately inflated without lobar consolidation or effusion. Subtle prominence of the perihilar vessels suggesting minimal vascular congestion. Borderline stable cardiomegaly. Remainder the exam is unchanged. IMPRESSION: Borderline stable cardiomegaly with suggestion of minimal vascular congestion. Electronically Signed   By: Marin Olp M.D.   On: 06/15/2019 18:30   ECHOCARDIOGRAM LIMITED  Result Date: 06/16/2019    ECHOCARDIOGRAM LIMITED REPORT   Patient Name:   Ariana Riggs Date of Exam: 06/16/2019 Medical Rec #:  [id number]          Height:       66.0 in Accession #:    [id number]         Weight:       223.6 lb Date of Birth:  Jul 04, 1960          BSA:          2.097 m Patient Age:    59 years           BP:           130/61 mmHg Patient Gender: F                  HR:           71 bpm. Exam Location:  [facility] Procedure: 2D Echo Indications:     CHF 428.31/ I50.31  History:         Patient has prior history of Echocardiogram examinations, most                  recent 06/20/2018.  Sonographer:     Arville Go RDCS Referring Phys:  [place] Diagnosing Phys: Skeet Latch MD IMPRESSIONS  1. Left ventricular ejection fraction, by estimation, is 55 to 60%. Left ventricular ejection fraction by PLAX is 45 %. The left ventricle has normal function. The left ventricle has no regional wall motion abnormalities. The left ventricular internal cavity size was mildly dilated. There is mild left ventricular hypertrophy.  2. Left atrial size was mildly dilated.  3. The inferior vena cava is  normal in size with greater than 50% respiratory variability, suggesting right atrial pressure of 3 mmHg. FINDINGS  Left Ventricle: Left ventricular ejection  fraction, by estimation, is 55 to 60%. Left ventricular ejection fraction by PLAX is 45 %. The left ventricle has normal function. The left ventricle has no regional wall motion abnormalities. The left ventricular internal cavity size was mildly dilated. There is mild left ventricular hypertrophy. Left Atrium: Left atrial size was mildly dilated. Venous: The inferior vena cava is normal in size with greater than 50% respiratory variability, suggesting right atrial pressure of 3 mmHg.  LEFT VENTRICLE PLAX 2D LV EF:         Left ventricular ejection fraction by PLAX is 45 %. LVIDd:         5.63 cm LVIDs:         4.35 cm LV PW:         1.15 cm LV IVS:        0.95 cm LVOT diam:     1.90 cm LVOT Area:     2.84 cm  LV Volumes (MOD) LV vol d, MOD A2C: 107.0 ml LV vol d, MOD A4C: 82.9 ml LV vol s, MOD A2C: 28.1 ml LV vol s, MOD A4C: 35.3 ml LV SV MOD A2C:     78.9 ml LV SV MOD A4C:     82.9 ml LV SV MOD BP:      65.4 ml RIGHT VENTRICLE RV Basal diam:  3.24 cm LEFT ATRIUM             Index       RIGHT ATRIUM           Index LA diam:        4.70 cm 2.24 cm/m  RA Area:     18.50 cm LA Vol (A2C):   70.9 ml 33.81 ml/m RA Volume:   50.40 ml  24.04 ml/m LA Vol (A4C):   63.8 ml 30.43 ml/m LA Biplane Vol: 67.1 ml 32.00 ml/m   AORTA Ao Root diam: 2.90 cm Ao Asc diam:  3.00 cm  SHUNTS Systemic Diam: 1.90 cm Skeet Latch MD Electronically signed by Skeet Latch MD Signature Date/Time: 06/16/2019/12:14:45 PM    Final     Cardiac Studies   Echo 06/16/19: IMPRESSIONS   1. Left ventricular ejection fraction, by estimation, is 55 to 60%.  The left ventricle has  normal function. The left ventricle has no regional wall motion  abnormalities. The left ventricular internal  cavity size was mildly dilated. There is mild left ventricular  hypertrophy.  2. Left atrial size was mildly dilated.  3. The inferior vena cava is normal in size with greater than 50%  respiratory variability, suggesting right atrial pressure  of 3 mmHg.   Lexiscan MPI 04/2018: Pharmacological myocardial perfusion imaging study with no significant Ischemia Small fixed defect of mild intensity in the inferoapical region consistent with attenuation artifact Normal wall motion, EF estimated at 55% No EKG changes concerning for ischemia at peak stress or in recovery. Mild aortic arch calcification noted on attenuation corrected CT scan images Low risk scan  Patient Profile     59 y.o. female with chronic diastolic heart failure, prior systolic heart failure related to tachycardia mediated cardiomyopathy with subsequent improvement in LVEF, status post ASD repair, paroxysmal atrial fibrillation, cirrhosis, bipolar disorder, OSA on CPAP, and polysubstance abuse admitted with unstable angina.  Assessment & Plan    # Unstable angina: Cardiac enzymes have been negative.  Currently chest pain is somewhat atypical.  She did have an episode of accelerated chest pain prior to admission.  She has not had any recurrence of this since she has been here.  Echo was reassuring.  However EKG does show anterolateral ST abnormalities that have been seen previously.  Given her underlying risk factors and previous negative stress test, plans for left heart catheterization tomorrow.  Eliquis has been held since admission.  She continues on heparin and is chest pain-free.  Risks and benefits of cardiac catheterization have been discussed with the patient.  The patient understands that risks included but are not limited to stroke (1 in 1000), death (1 in 49), kidney failure [usually temporary] (1 in 500), bleeding (1 in 200), allergic reaction [possibly serious] (1 in 200). The patient understands and agrees to proceed.   # Chronic diastolic heart failure:  She previously had systolic dysfunction that improved with rate control.  Echo this admission was reassuring.  Continue metoprolol.  She is euvolemic on exam.  #Persistent atrial  fibrillation: Maintaining sinus rhythm.  Continue metoprolol and amiodarone.  Eliquis is on hold and she is on the 100  # Essential hypertension: Blood pressure stable  # OSA:  Patient's husband will bring her CPAP from home.  She does not hospital CPAP machine.  #Cirrhosis: Previously required paracentesis.  Volume status and abdomen are stable at this time.  Continue spironolactone, Lasix, and rifaximin.       For questions or updates, please contact [place] Please consult [email or web address] for contact info under        Signed, Skeet Latch, MD  06/17/2019, 12:14 PM

## 2019-06-18 ENCOUNTER — Encounter
Admission: AD | Disposition: A | Discharge status: Home / Self Care | Payer: Self-pay | Source: Home / Self Care | Attending: Internal Medicine

## 2019-06-18 ENCOUNTER — Encounter: Payer: Self-pay | Admitting: Internal Medicine

## 2019-06-18 DIAGNOSIS — I25110 Atherosclerotic heart disease of native coronary artery with unstable angina pectoris: Principal | ICD-10-CM

## 2019-06-18 HISTORY — PX: LEFT HEART CATH AND CORONARY ANGIOGRAPHY: CATH118249

## 2019-06-18 HISTORY — PX: CORONARY STENT INTERVENTION: CATH118234

## 2019-06-18 LAB — CBC
HCT: 39.5 % (ref 36.0–46.0)
Hemoglobin: 13.6 g/dL (ref 12.0–15.0)
MCH: 31.3 pg (ref 26.0–34.0)
MCHC: 34.4 g/dL (ref 30.0–36.0)
MCV: 91 fL (ref 80.0–100.0)
Platelets: 166 10*3/uL (ref 150–400)
RBC: 4.34 MIL/uL (ref 3.87–5.11)
RDW: 14.6 % (ref 11.5–15.5)
WBC: 5.7 10*3/uL (ref 4.0–10.5)
nRBC: 0 % (ref 0.0–0.2)

## 2019-06-18 LAB — BASIC METABOLIC PANEL
Anion gap: 7 (ref 5–15)
BUN: 18 mg/dL (ref 6–20)
CO2: 30 mmol/L (ref 22–32)
Calcium: 9.3 mg/dL (ref 8.9–10.3)
Chloride: 104 mmol/L (ref 98–111)
Creatinine, Ser: 1.11 mg/dL — ABNORMAL HIGH (ref 0.44–1.00)
GFR calc Af Amer: >60 mL/min (ref >60)
GFR calc non Af Amer: 54 mL/min — ABNORMAL LOW (ref >60)
Glucose, Bld: 141 mg/dL — ABNORMAL HIGH (ref 70–99)
Potassium: 3.9 mmol/L (ref 3.5–5.1)
Sodium: 141 mmol/L (ref 135–145)

## 2019-06-18 LAB — HEPARIN LEVEL (UNFRACTIONATED): Heparin Unfractionated: 0.25 IU/mL — ABNORMAL LOW (ref 0.30–0.70)

## 2019-06-18 LAB — POCT ACTIVATED CLOTTING TIME: Activated Clotting Time: 301 seconds

## 2019-06-18 SURGERY — LEFT HEART CATH AND CORONARY ANGIOGRAPHY
Anesthesia: Moderate Sedation

## 2019-06-18 MED ORDER — SODIUM CHLORIDE 0.9 % IV SOLN: INTRAVENOUS | Status: AC

## 2019-06-18 MED ORDER — SODIUM CHLORIDE 0.9% FLUSH: 3.0000 mL | Freq: Two times a day (BID) | INTRAVENOUS | Status: DC

## 2019-06-18 MED ORDER — CLOPIDOGREL BISULFATE 75 MG PO TABS
ORAL_TABLET | ORAL | Status: AC
  Filled 2019-06-18: qty 8

## 2019-06-18 MED ORDER — NITROGLYCERIN 1 MG/10 ML FOR IR/CATH LAB
INTRA_ARTERIAL | Status: AC
  Filled 2019-06-18: qty 10

## 2019-06-18 MED ORDER — ASPIRIN 81 MG PO CHEW
CHEWABLE_TABLET | ORAL | Status: AC
  Filled 2019-06-18: qty 3

## 2019-06-18 MED ORDER — SODIUM CHLORIDE 0.9 % IV SOLN
250.0000 mL | INTRAVENOUS | Status: DC | PRN
  Administered 2019-06-18: 250 mL via INTRAVENOUS

## 2019-06-18 MED ORDER — CLOPIDOGREL BISULFATE 75 MG PO TABS
ORAL_TABLET | ORAL | Status: DC | PRN
  Administered 2019-06-18: 300 mg via ORAL

## 2019-06-18 MED ORDER — HEPARIN SODIUM (PORCINE) 1000 UNIT/ML IJ SOLN
INTRAMUSCULAR | Status: AC
  Filled 2019-06-18: qty 1

## 2019-06-18 MED ORDER — SODIUM CHLORIDE 0.9 % IV SOLN: INTRAVENOUS | Status: DC

## 2019-06-18 MED ORDER — SODIUM CHLORIDE 0.9% FLUSH: 3.0000 mL | INTRAVENOUS | Status: DC | PRN

## 2019-06-18 MED ORDER — SODIUM CHLORIDE 0.9 % IV SOLN: 250.0000 mL | INTRAVENOUS | Status: DC | PRN

## 2019-06-18 MED ORDER — ASPIRIN 81 MG PO CHEW
CHEWABLE_TABLET | ORAL | Status: AC
  Filled 2019-06-18: qty 1

## 2019-06-18 MED ORDER — HEPARIN (PORCINE) IN NACL 1000-0.9 UT/500ML-% IV SOLN
INTRAVENOUS | Status: DC | PRN
  Administered 2019-06-18: 500 mL

## 2019-06-18 MED ORDER — VERAPAMIL HCL 2.5 MG/ML IV SOLN
INTRAVENOUS | Status: DC | PRN
  Administered 2019-06-18: 2.5 mg via INTRA_ARTERIAL

## 2019-06-18 MED ORDER — ASPIRIN 81 MG PO CHEW
CHEWABLE_TABLET | ORAL | Status: DC | PRN
  Administered 2019-06-18: 243 mg via ORAL

## 2019-06-18 MED ORDER — MIDAZOLAM HCL 2 MG/2ML IJ SOLN
INTRAMUSCULAR | Status: DC | PRN
  Administered 2019-06-18: 1 mg via INTRAVENOUS

## 2019-06-18 MED ORDER — IOHEXOL 300 MG/ML  SOLN
INTRAMUSCULAR | Status: DC | PRN
  Administered 2019-06-18: 120 mL

## 2019-06-18 MED ORDER — HEPARIN SODIUM (PORCINE) 1000 UNIT/ML IJ SOLN
INTRAMUSCULAR | Status: DC | PRN
  Administered 2019-06-18 (×2): 5000 [IU] via INTRAVENOUS

## 2019-06-18 MED ORDER — FUROSEMIDE 40 MG PO TABS
40.0000 mg | ORAL_TABLET | Freq: Two times a day (BID) | ORAL | Status: DC
  Administered 2019-06-18 – 2019-06-19 (×2): 40 mg via ORAL
  Filled 2019-06-18 (×3): qty 1

## 2019-06-18 MED ORDER — APIXABAN 5 MG PO TABS
5.0000 mg | ORAL_TABLET | Freq: Two times a day (BID) | ORAL | Status: DC
  Administered 2019-06-19: 5 mg via ORAL
  Filled 2019-06-18 (×2): qty 1

## 2019-06-18 MED ORDER — FENTANYL CITRATE (PF) 100 MCG/2ML IJ SOLN
INTRAMUSCULAR | Status: AC
  Filled 2019-06-18: qty 2

## 2019-06-18 MED ORDER — MIDAZOLAM HCL 2 MG/2ML IJ SOLN
INTRAMUSCULAR | Status: AC
  Filled 2019-06-18: qty 2

## 2019-06-18 MED ORDER — CLOPIDOGREL BISULFATE 75 MG PO TABS
75.0000 mg | ORAL_TABLET | Freq: Every day | ORAL | Status: DC
  Administered 2019-06-19: 75 mg via ORAL
  Filled 2019-06-18: qty 1

## 2019-06-18 MED ORDER — SODIUM CHLORIDE 0.9% FLUSH
3.0000 mL | Freq: Two times a day (BID) | INTRAVENOUS | Status: DC
  Administered 2019-06-18: 3 mL via INTRAVENOUS

## 2019-06-18 MED ORDER — NITROGLYCERIN 1 MG/10 ML FOR IR/CATH LAB
INTRA_ARTERIAL | Status: DC | PRN
  Administered 2019-06-18: 200 ug via INTRACORONARY
  Administered 2019-06-18: 200 ug via INTRA_ARTERIAL

## 2019-06-18 MED ORDER — FENTANYL CITRATE (PF) 100 MCG/2ML IJ SOLN
INTRAMUSCULAR | Status: DC | PRN
  Administered 2019-06-18 (×2): 50 ug via INTRAVENOUS

## 2019-06-18 MED ORDER — HEPARIN (PORCINE) IN NACL 1000-0.9 UT/500ML-% IV SOLN
INTRAVENOUS | Status: AC
  Filled 2019-06-18: qty 1000

## 2019-06-18 MED ORDER — VERAPAMIL HCL 2.5 MG/ML IV SOLN
INTRAVENOUS | Status: AC
  Filled 2019-06-18: qty 2

## 2019-06-18 SURGICAL SUPPLY — 14 items
BALLN TREK RX 2.5X12 (BALLOONS) ×3
BALLOON TREK RX 2.5X12 (BALLOONS) IMPLANT
CATH INFINITI 5FR JK (CATHETERS) ×2 IMPLANT
CATH LAUNCHER 5F EBU3.5 (CATHETERS) ×2 IMPLANT
CATH LAUNCHER 6FR EBU3.5 (CATHETERS) ×2 IMPLANT
DEVICE INFLAT 30 PLUS (MISCELLANEOUS) ×2 IMPLANT
DEVICE RAD TR BAND REGULAR (VASCULAR PRODUCTS) ×2 IMPLANT
GLIDESHEATH SLEND SS 6F .021 (SHEATH) ×2 IMPLANT
GUIDEWIRE INQWIRE 1.5J.035X260 (WIRE) IMPLANT
INQWIRE 1.5J .035X260CM (WIRE) ×3
KIT MANI 3VAL PERCEP (MISCELLANEOUS) ×3 IMPLANT
PACK CARDIAC CATH (CUSTOM PROCEDURE TRAY) ×2 IMPLANT
STENT RESOLUTE ONYX 2.75X18 (Permanent Stent) ×2 IMPLANT
WIRE RUNTHROUGH .014X180CM (WIRE) ×2 IMPLANT

## 2019-06-18 NOTE — Interval H&P Note (Signed)
History and Physical Interval Note:  06/18/2019 8:09 AM  Ariana Riggs  has presented today for surgery, with the diagnosis of Unstable angina.  The various methods of treatment have been discussed with the patient and family. After consideration of risks, benefits and other options for treatment, the patient has consented to  Procedure(s): LEFT HEART CATH AND CORONARY ANGIOGRAPHY (N/A) as a surgical intervention.  The patient's history has been reviewed, patient examined, no change in status, stable for surgery.  I have reviewed the patient's chart and labs.  Questions were answered to the patient's satisfaction.     Kathlyn [place]

## 2019-06-18 NOTE — Progress Notes (Signed)
PROGRESS NOTE    Ariana Riggs  [id number] DOB: [birth year] DOA: 06/15/2019 PCP: Ezequiel Kayser, MD    No chief complaint on file.   Brief Narrative:  59 year old obese female with COPD, OSA on CPAP, A. fib on Eliquis, bipolar disorder, asthma, cirrhosis of liver, tachycardia induced cardiomyopathy (normalized EF on last echo), prior history tobacco, alcohol and cocaine use (currently in remission) sent from cardiologist office where she presented with chest tightness and T wave inversion on anterolateral leads on EKG. Admitted for unstable angina and left heart catheterization done today showing mid circumflex to distal circumflex lesion 90% stenosed with a drug-eluting stent placed to the left circumflex, normal LV function.  Assessment & Plan:   Principal Problem:   Unstable angina [facility]) Cardiac cath with severe one-vessel CAD involving mid to distal left circumflex with successful angioplasty and DES to the left circumflex.  Cardiology recommends resuming Eliquis in a.m. if no bleeding issues..  Recommends discharging on Plavix for 6 months and discontinue aspirin. Continue statin.  Active Problems: Acute on chronic diastolic CHF (HCC) Received IV Lasix x2 and then switched to home dose p.o. Lasix.  Euvolemic now.  EF normal per echo and left heart cath so Lasix dose reduced to 40 mg twice daily.     Chronic obstructive pulmonary disease (HCC) Stable.  Continue home inhaler     Hepatic cirrhosis (HCC) Currently stable.  Reports quitting alcohol over 15 months back.    Persistent atrial fibrillation (HCC) Rate controlled.  Continue amiodarone and beta-blocker.  Eliquis held for cardiac cath and on IV heparin.  OSA Nighttime CPAP   DVT prophylaxis: None, Eliquis in a.m. Code Status: Full code Family Communication: None at bedside. Disposition:   Status is: Inpatient  Remains inpatient appropriate because: Unstable angina with severe CAD requiring stenting.   Monitor overnight post procedure and discharged home tomorrow if stable.   Dispo: The patient is from: Home              Anticipated d/c is to: Home              Anticipated d/c date is: 1 day              Patient currently is not medically stable to d/c.        Consultants:   Cardiology  Procedures: 2D echo, left heart cath  Antimicrobials: None   Subjective: Seen and examined after returning from cath.  Denies any chest pain symptoms or pain at the cath site.  Objective: Vitals:   06/18/19 1130 06/18/19 1200 06/18/19 1221 06/18/19 1533  BP: (!) 106/57 105/62 103/73 (!) 120/55  Pulse: 64 63 65 75  Resp: 19 19 18 17   Temp:   98.2 F (36.8 C) 98 F (36.7 C)  TempSrc:      SpO2: 93% 96% 96% 91%  Weight:        Intake/Output Summary (Last 24 hours) at 06/18/2019 1606 Last data filed at 06/18/2019 1356 Gross per 24 hour  Intake 720 ml  Output 800 ml  Net -80 ml   Filed Weights   06/16/19 0427 06/17/19 0517 06/18/19 0412  Weight: 101.4 kg 99.7 kg 99.9 kg   Physical exam Not in distress HEENT: Moist mucosa, supple neck lungs clear CVs: Normal S1-S2 GI: Soft, nontender, nondistended Musculoskeletal: Warm, right radial cath site is clean    Data Reviewed: I have personally reviewed following labs and imaging studies  CBC: Recent Labs  Lab 06/15/19 1720 06/16/19  0101 06/17/19 0459 06/18/19 0631  WBC 7.6 6.4 6.1 5.7  NEUTROABS 4.3  --   --   --   HGB 13.4 13.0 13.9 13.6  HCT 37.3 36.7 38.2 39.5  MCV 90.1 90.8 88.6 91.0  PLT 174 160 154 [id number]    Basic Metabolic Panel: Recent Labs  Lab 06/15/19 1720 06/16/19 0101 06/17/19 0459 06/18/19 0631  NA 141 142 141 141  K 3.5 3.5 3.4* 3.9  CL 102 102 105 104  CO2 28 29 27 30   GLUCOSE 122* 134* 134* 141*  BUN 13 13 15 18   CREATININE 0.88 1.03* 1.01* 1.11*  CALCIUM 9.4 8.9 9.3 9.3    GFR: Estimated Creatinine Clearance: 65 mL/min (A) (by C-G formula based on SCr of 1.11 mg/dL (H)).  Liver Function  Tests: No results for input(s): AST, ALT, ALKPHOS, BILITOT, PROT, ALBUMIN in the last 168 hours.  CBG: No results for input(s): GLUCAP in the last 168 hours.   Recent Results (from the past 240 hour(s))  Respiratory Panel by RT PCR (Flu A&B, Covid) - Nasopharyngeal Swab     Status: None   Collection Time: 06/15/19  9:25 PM   Specimen: Nasopharyngeal Swab  Result Value Ref Range Status   SARS Coronavirus 2 by RT PCR NEGATIVE NEGATIVE Final    Comment: (NOTE) SARS-CoV-2 target nucleic acids are NOT DETECTED. The SARS-CoV-2 RNA is generally detectable in upper respiratoy specimens during the acute phase of infection. The lowest concentration of SARS-CoV-2 viral copies this assay can detect is 131 copies/mL. A negative result does not preclude SARS-Cov-2 infection and should not be used as the sole basis for treatment or other patient management decisions. A negative result may occur with  improper specimen collection/handling, submission of specimen other than nasopharyngeal swab, presence of viral mutation(s) within the areas targeted by this assay, and inadequate number of viral copies (<131 copies/mL). A negative result must be combined with clinical observations, patient history, and epidemiological information. The expected result is Negative. Fact Sheet for Patients:  PinkCheek.be Fact Sheet for Healthcare Providers:  GravelBags.it This test is not yet ap proved or cleared by the [place] FDA and  has been authorized for detection and/or diagnosis of SARS-CoV-2 by FDA under an Emergency Use Authorization (EUA). This EUA will remain  in effect (meaning this test can be used) for the duration of the COVID-19 declaration under Section 564(b)(1) of the Act, 21 U.S.C. section 360bbb-3(b)(1), unless the authorization is terminated or revoked sooner.    Influenza A by PCR NEGATIVE NEGATIVE Final   Influenza B by PCR  NEGATIVE NEGATIVE Final    Comment: (NOTE) The Xpert Xpress SARS-CoV-2/FLU/RSV assay is intended as an aid in  the diagnosis of influenza from Nasopharyngeal swab specimens and  should not be used as a sole basis for treatment. Nasal washings and  aspirates are unacceptable for Xpert Xpress SARS-CoV-2/FLU/RSV  testing. Fact Sheet for Patients: PinkCheek.be Fact Sheet for Healthcare Providers: GravelBags.it This test is not yet approved or cleared by the [place] FDA and  has been authorized for detection and/or diagnosis of SARS-CoV-2 by  FDA under an Emergency Use Authorization (EUA). This EUA will remain  in effect (meaning this test can be used) for the duration of the  Covid-19 declaration under Section 564(b)(1) of the Act, 21  U.S.C. section 360bbb-3(b)(1), unless the authorization is  terminated or revoked. Performed at [facility], [address], [place], [place] [address]  Radiology Studies: CARDIAC CATHETERIZATION  Result Date: 06/18/2019  The left ventricular systolic function is normal.  LV end diastolic pressure is normal.  The left ventricular ejection fraction is 55-65% by visual estimate.  Prox Cx lesion is 30% stenosed.  Mid Cx to Dist Cx lesion is 90% stenosed.  Post intervention, there is a 0% residual stenosis.  A drug-eluting stent was successfully placed using a [place] [id number].  1.  Severe one-vessel coronary artery disease involving mid to distal left circumflex. 2.  Normal LV systolic function and normal left ventricular end-diastolic pressure at 10 mmHg 3.  Successful angioplasty and drug-eluting stent placement to the left circumflex. Recommendations: Eliquis can be resumed tomorrow morning if no bleeding issues. Given that the patient is on long-term anticoagulation, recommend treatment with clopidogrel for 6 months without aspirin.  Aspirin can be discontinued  before hospital discharge. I decreased the dose of furosemide to 40 mg twice daily considering normal LVEDP today. Likely discharge home tomorrow.        Scheduled Meds: . allopurinol  150 mg Oral Daily  . amiodarone  100 mg Oral Daily  . [START ON 06/19/2019] apixaban  5 mg Oral BID  . aspirin      . aspirin  81 mg Oral Daily  . atorvastatin  10 mg Oral Daily  . citalopram  20 mg Oral Daily  . [START ON 06/19/2019] clopidogrel  75 mg Oral Q breakfast  . fluticasone  2 spray Each Nare BID  . furosemide  40 mg Oral BID  . lactulose  60 g Oral BID  . LORazepam  1 mg Oral BID  . losartan  25 mg Oral Daily  . melatonin  5 mg Oral QHS  . metoprolol succinate  12.5 mg Oral Daily  . montelukast  10 mg Oral QHS  . multivitamin with minerals  1 tablet Oral Daily  . pantoprazole  40 mg Oral Daily  . potassium chloride  20 mEq Oral BID  . rifaximin  550 mg Oral BID  . sodium chloride flush  3 mL Intravenous Q12H  . sodium chloride flush  3 mL Intravenous Q12H  . spironolactone  25 mg Oral Daily  . umeclidinium-vilanterol  1 puff Inhalation Daily   Continuous Infusions: . sodium chloride    . sodium chloride       LOS: 3 days    Time spent: 25 minutes    Shalyn Koral, MD [facility]   To contact the attending provider between 7A-7P or the covering provider during after hours 7P-7A, please log into the web site [email or web address] and access using universal [place] password for that web site. If you do not have the password, please call the hospital operator.  06/18/2019, 4:06 PM

## 2019-06-18 NOTE — Progress Notes (Signed)
Pt is resting comfortably in bed. Discussed warning signs of heart failure and when to call her MD. Patients clearly states understanding.

## 2019-06-19 DIAGNOSIS — E782 Mixed hyperlipidemia: Secondary | ICD-10-CM

## 2019-06-19 DIAGNOSIS — J449 Chronic obstructive pulmonary disease, unspecified: Secondary | ICD-10-CM

## 2019-06-19 DIAGNOSIS — I5032 Chronic diastolic (congestive) heart failure: Secondary | ICD-10-CM

## 2019-06-19 LAB — BASIC METABOLIC PANEL
Anion gap: 9 (ref 5–15)
BUN: 20 mg/dL (ref 6–20)
CO2: 27 mmol/L (ref 22–32)
Calcium: 9.3 mg/dL (ref 8.9–10.3)
Chloride: 103 mmol/L (ref 98–111)
Creatinine, Ser: 1.04 mg/dL — ABNORMAL HIGH (ref 0.44–1.00)
GFR calc Af Amer: >60 mL/min (ref >60)
GFR calc non Af Amer: 59 mL/min — ABNORMAL LOW (ref >60)
Glucose, Bld: 124 mg/dL — ABNORMAL HIGH (ref 70–99)
Potassium: 3.9 mmol/L (ref 3.5–5.1)
Sodium: 139 mmol/L (ref 135–145)

## 2019-06-19 LAB — CBC
HCT: 38.6 % (ref 36.0–46.0)
Hemoglobin: 14.1 g/dL (ref 12.0–15.0)
MCH: 32.3 pg (ref 26.0–34.0)
MCHC: 36.5 g/dL — ABNORMAL HIGH (ref 30.0–36.0)
MCV: 88.5 fL (ref 80.0–100.0)
Platelets: 165 10*3/uL (ref 150–400)
RBC: 4.36 MIL/uL (ref 3.87–5.11)
RDW: 14.5 % (ref 11.5–15.5)
WBC: 7.3 10*3/uL (ref 4.0–10.5)
nRBC: 0 % (ref 0.0–0.2)

## 2019-06-19 MED ORDER — FUROSEMIDE 40 MG PO TABS: 40.0000 mg | ORAL_TABLET | Freq: Two times a day (BID) | ORAL | 0 refills | Status: DC

## 2019-06-19 MED ORDER — CLOPIDOGREL BISULFATE 75 MG PO TABS: 75.0000 mg | ORAL_TABLET | Freq: Every day | ORAL | 1 refills | Status: DC

## 2019-06-19 NOTE — Progress Notes (Signed)
Progress Note  Patient Name: Ariana Riggs Date of Encounter: 06/19/2019  Primary Cardiologist: End  Subjective   No further chest pain or dyspnea. No complications from the right radial cardiac cath site. Post-cath labs are stable. Vitals stable. Tolerating dual therapy. Ready to go home.   Inpatient Medications    Scheduled Meds: . allopurinol  150 mg Oral Daily  . amiodarone  100 mg Oral Daily  . apixaban  5 mg Oral BID  . atorvastatin  10 mg Oral Daily  . citalopram  20 mg Oral Daily  . clopidogrel  75 mg Oral Q breakfast  . fluticasone  2 spray Each Nare BID  . furosemide  40 mg Oral BID  . lactulose  60 g Oral BID  . LORazepam  1 mg Oral BID  . losartan  25 mg Oral Daily  . melatonin  5 mg Oral QHS  . metoprolol succinate  12.5 mg Oral Daily  . montelukast  10 mg Oral QHS  . multivitamin with minerals  1 tablet Oral Daily  . pantoprazole  40 mg Oral Daily  . potassium chloride  20 mEq Oral BID  . rifaximin  550 mg Oral BID  . sodium chloride flush  3 mL Intravenous Q12H  . sodium chloride flush  3 mL Intravenous Q12H  . spironolactone  25 mg Oral Daily  . umeclidinium-vilanterol  1 puff Inhalation Daily   Continuous Infusions: . sodium chloride     PRN Meds: sodium chloride, acetaminophen, albuterol, doxylamine (Sleep), nitroGLYCERIN, ondansetron (ZOFRAN) IV, sodium chloride flush   Vital Signs    Vitals:   06/18/19 2244 06/18/19 2257 06/19/19 0407 06/19/19 0745  BP:  128/75 129/61 131/78  Pulse: 79 70 72 74  Resp:  17  19  Temp:  98.8 F (37.1 C) 97.8 F (36.6 C) 98 F (36.7 C)  TempSrc:  Oral Oral   SpO2:  97% 90% 97%  Weight:   100.2 kg     Intake/Output Summary (Last 24 hours) at 06/19/2019 0839 Last data filed at 06/19/2019 [id number] Gross per 24 hour  Intake 960 ml  Output 2425 ml  Net -1465 ml   Filed Weights   06/17/19 0517 06/18/19 0412 06/19/19 0407  Weight: 99.7 kg 99.9 kg 100.2 kg    Telemetry    SR 60s bpm - Personally  Reviewed  ECG    NSR, 67 bpm, poor R wave progression along the precordial leads, low voltage QRS, nonspecific anterolateral st/t changes  - Personally Reviewed  Physical Exam   GEN: No acute distress.   Neck: No JVD. Cardiac: RRR, no murmurs, rubs, or gallops. Right radial cardiac cath site is well healing without active bleeding, bruising, swelling, warmth, erythema, or TTP. Radial pulse 2+. Respiratory: Clear to auscultation bilaterally.  GI: Soft, nontender, non-distended.   MS: No edema; No deformity. Neuro:  Alert and oriented x 3; Nonfocal.  Psych: Normal affect.  Labs    Chemistry Recent Labs  Lab 06/17/19 0459 06/18/19 0631 06/19/19 0422  NA 141 141 139  K 3.4* 3.9 3.9  CL 105 104 103  CO2 27 30 27   GLUCOSE 134* 141* 124*  BUN 15 18 20   CREATININE 1.01* 1.11* 1.04*  CALCIUM 9.3 9.3 9.3  GFRNONAA >60 54* 59*  GFRAA >60 >60 >60  ANIONGAP 9 7 9      Hematology Recent Labs  Lab 06/17/19 0459 06/18/19 0631 06/19/19 0422  WBC 6.1 5.7 7.3  RBC 4.31 4.34 4.36  HGB 13.9 13.6 14.1  HCT 38.2 39.5 38.6  MCV 88.6 91.0 88.5  MCH 32.3 31.3 32.3  MCHC 36.4* 34.4 36.5*  RDW 14.6 14.6 14.5  PLT 154 166 165    Cardiac EnzymesNo results for input(s): TROPONINI in the last 168 hours. No results for input(s): TROPIPOC in the last 168 hours.   BNP Recent Labs  Lab 06/15/19 1720  BNP 137.0*     DDimer No results for input(s): DDIMER in the last 168 hours.   Radiology    CXR 06/15/2019: IMPRESSION: Borderline stable cardiomegaly with suggestion of minimal vascular congestion.  Cardiac Studies   2D echo 06/16/2019: 1. Left ventricular ejection fraction, by estimation, is 55 to 60%. Left  ventricular ejection fraction by PLAX is 45 %. The left ventricle has  normal function. The left ventricle has no regional wall motion  abnormalities. The left ventricular internal  cavity size was mildly dilated. There is mild left ventricular  hypertrophy.  2. Left  atrial size was mildly dilated.  3. The inferior vena cava is normal in size with greater than 50%  respiratory variability, suggesting right atrial pressure of 3 mmHg.  __________  [facility] 06/18/2019:  The left ventricular systolic function is normal.  LV end diastolic pressure is normal.  The left ventricular ejection fraction is 55-65% by visual estimate.  Prox Cx lesion is 30% stenosed.  Mid Cx to Dist Cx lesion is 90% stenosed.  Post intervention, there is a 0% residual stenosis.  A drug-eluting stent was successfully placed using a [place] [id number].   1.  Severe one-vessel coronary artery disease involving mid to distal left circumflex. 2.  Normal LV systolic function and normal left ventricular end-diastolic pressure at 10 mmHg 3.  Successful angioplasty and drug-eluting stent placement to the left circumflex.  Recommendations: Eliquis can be resumed tomorrow morning if no bleeding issues. Given that the patient is on long-term anticoagulation, recommend treatment with clopidogrel for 6 months without aspirin.  Aspirin can be discontinued before hospital discharge. I decreased the dose of furosemide to 40 mg twice daily considering normal LVEDP today. Likely discharge home tomorrow.  Patient Profile     59 y.o. female with history of HFrEF secondary to presumed tachycardia mediated cardiomyopathy in the setting of A. fib with RVR with subsequent normalization of LVEF in sinus rhythm, persistent A. fib on Eliquis, remote ASD repair in approximately 1980, cirrhosis, bipolar disorder, obstructive sleep apnea on CPAP, and polysubstance abuse who is being seen today for the evaluation of unstable angina.   Assessment & Plan    1. CAD involving the native coronary arteries with unstable angina: -Currently, without chest pain -LHC 06/18/2019 with PCI/DES to the LCx as above -High-sensitivity troponin negative x2 -Eliquis and Plavix recommended for 6 months without ASA,  followed by continuation of Eliquis thereafter -Post cath instructions -Cardiac rehab  2.  Chronic HFrEF secondary to presumed tachycardia mediated cardiomyopathy: -She does not appear grossly volume overload and is well compensated -Echo as above -Continue PTA Lasix, losartan, Toprol-XL, and spironolactone  3.  Persistent A. fib: -Maintaining sinus rhythm -Continue Toprol-XL and amiodarone -CHA2DS2-VASc at least 3 (CHF, vascular disease, gender) -Eliquis resumed this morning  4.  Cirrhosis: -Has previously required paracentesis -Remains on spironolactone, Lasix, and rifaximin  5.  OSA: -CPAP  6. HLD: -LDL 38 this admission -PTA Lipitor  Dispo: Follow up in our office next week   For questions or updates, please contact [place] Please consult [email or web address] for  contact info under Cardiology/STEMI.    Signed, Christell Faith, PA-C [place] Pager: [phone] 06/19/2019, 8:39 AM

## 2019-06-19 NOTE — Progress Notes (Signed)
IVs and tele removed from patient. Discharge instructions given to patient. Verbalized understanding. No acute distress at this time. Patient to drive herself home.

## 2019-06-19 NOTE — Discharge Summary (Signed)
Physician Discharge Summary  Ariana Riggs [id number] DOB: Jun 30, 1960 DOA: 06/15/2019  PCP: Ezequiel Kayser, MD  Admit date: 06/15/2019 Discharge date: 06/19/2019  Admitted From: Home Disposition: Home  Recommendations for Outpatient Follow-up:  1. Follow-up with PCP and cardiology in 2 weeks.   Home Health: None Equipment/Devices: None  Discharge Condition: Fair CODE STATUS: Full code Diet recommendation: Heart Healthy   Discharge Diagnoses:  Principal Problem:   Unstable angina (HCC)   Active Problems:   Chronic obstructive pulmonary disease (HCC)   Hepatic cirrhosis (HCC)   Persistent atrial fibrillation (HCC)   Mixed hyperlipidemia Chronic diastolic CHF OSA on CPAP  Brief narrative/HPI 59 year old obese female with COPD, OSA on CPAP, A. fib on Eliquis, bipolar disorder, asthma, cirrhosis of liver, tachycardia induced cardiomyopathy (normalized EF on last echo), prior history tobacco, alcohol and cocaine use (currently in remission) sent from cardiologist office where she presented with chest tightness and T wave inversion on anterolateral leads on EKG. Admitted for unstable angina and left heart catheterization done today showing mid circumflex to distal circumflex lesion 90% stenosed with a drug-eluting stent placed to the left circumflex, normal LV function.   Hospital course  Principal Problem:   Unstable angina [facility]) Cardiac cath with severe one-vessel CAD involving mid to distal left circumflex with successful angioplasty and DES to the left circumflex.    Started back on Eliquis.  Cardiology recommends discharging on Plavix for 6 months and then maintain on Eliquis alone. Continue statin. Patient clinically stable and can be discharged home with outpatient cardiology follow-up.  Active Problems: chronic diastolic CHF ([place]) Patient given IV Lasix with concern for acute on chronic diastolic CHF that was ruled out.  2D echo and LV catheterization shows  normal EF.  Reduce home dose of Lasix to 40 mg twice daily, continue beta-blocker, losartan, statin and Aldactone.     Chronic obstructive pulmonary disease (HCC) Stable.  Continue home inhaler.     Hepatic cirrhosis (HCC) Currently stable.  Reports quitting alcohol over 15 months back.    Persistent atrial fibrillation (HCC) Rate controlled.  Continue amiodarone, beta-blocker and Eliquis  OSA Nighttime CPAP  Procedure: 2D echo, left heart catheterization Consult: Cardiology Family communication: Husband involved in care   Discharge Instructions  Discharge Instructions    AMB Referral to Cardiac Rehabilitation - Phase II   Complete by: As directed    Diagnosis: Coronary Stents   After initial evaluation and assessments completed: Virtual Based Care may be provided alone or in conjunction with Phase 2 Cardiac Rehab based on patient barriers.: Yes     Allergies as of 06/19/2019      Reactions   Melatonin Hives   Doxycycline Nausea And Vomiting   Paxil [paroxetine Hcl] Hives   Penicillins Hives   Sulfa Antibiotics Hives      Medication List    TAKE these medications   albuterol 108 (90 Base) MCG/ACT inhaler Commonly known as: VENTOLIN HFA Inhale into the lungs every 6 (six) hours as needed for wheezing or shortness of breath.   albuterol (2.5 MG/3ML) 0.083% nebulizer solution Commonly known as: PROVENTIL Inhale 3 mLs into the lungs every 6 (six) hours as needed for wheezing.   allopurinol 300 MG tablet Commonly known as: ZYLOPRIM Take 0.5 tablets (150 mg total) by mouth daily. What changed: how much to take   amiodarone 100 MG tablet Commonly known as: PACERONE Take 1 tablet (100 mg total) by mouth daily.   apixaban 5 MG Tabs tablet Commonly known  asArne [place] Take 1 tablet (5 mg total) by mouth 2 (two) times daily.   atorvastatin 10 MG tablet Commonly known as: LIPITOR Take 1 tablet (10 mg total) by mouth daily.   Belsomra 10 MG Tabs Generic drug:  Suvorexant TAKE 1/2   1 TABLET BY MOUTH AT BEDTIME AT THE SAME TIME EVERY NIGHT   citalopram 20 MG tablet Commonly known as: CELEXA Take 20 mg by mouth daily.   clopidogrel 75 MG tablet Commonly known as: PLAVIX Take 1 tablet (75 mg total) by mouth daily with breakfast. Start taking on: Jun 20, 2019   colchicine 0.6 MG tablet Take 0.6 mg by mouth as needed.   fluticasone 50 MCG/ACT nasal spray Commonly known as: FLONASE Place 2 sprays into both nostrils 2 (two) times daily.   furosemide 40 MG tablet Commonly known as: LASIX Take 1 tablet (40 mg total) by mouth 2 (two) times daily.   lactulose 10 GM/15ML solution Commonly known as: CHRONULAC 60 g 2 (two) times a day.   lansoprazole 30 MG capsule Commonly known as: PREVACID Take 30 mg by mouth daily at 12 noon.   LORazepam 1 MG tablet Commonly known as: ATIVAN TAKE 1 TABLET BY MOUTH EVERY MORNING AND EVERY EVENING AS DIRECTED   losartan 25 MG tablet Commonly known as: COZAAR Take 1 tablet (25 mg total) by mouth daily.   metoprolol succinate 25 MG 24 hr tablet Commonly known as: TOPROL-XL Take 0.5 tablets (12.5 mg total) by mouth daily.   montelukast 10 MG tablet Commonly known as: SINGULAIR Take 10 mg by mouth Nightly.   multivitamin-iron-minerals-folic acid chewable tablet Chew 1 tablet by mouth daily.   potassium chloride 20 MEQ packet Commonly known as: KLOR-CON Take 20 mEq by mouth 2 (two) times daily. What changed: when to take this   spironolactone 25 MG tablet Commonly known as: ALDACTONE Take 1 tablet (25 mg total) by mouth daily.   umeclidinium-vilanterol 62.5-25 MCG/INH Aepb Commonly known as: ANORO ELLIPTA Inhale 1 puff into the lungs daily.   Xifaxan 550 MG Tabs tablet Generic drug: rifaximin Take 550 mg by mouth 2 (two) times daily.      Follow-up Information    Ezequiel Kayser, MD Follow up in 1 week(s).   Specialty: Internal Medicine Contact information: [address] [place] [address] [phone]        Nelva Bush, MD Follow up in 2 week(s).   Specialty: Cardiology Contact information: [place] [address] [phone]          Allergies  Allergen Reactions  . Melatonin Hives  . Doxycycline Nausea And Vomiting  . Paxil [Paroxetine Hcl] Hives  . Penicillins Hives  . Sulfa Antibiotics Hives     Procedures/Studies: CARDIAC CATHETERIZATION  Result Date: 06/18/2019  The left ventricular systolic function is normal.  LV end diastolic pressure is normal.  The left ventricular ejection fraction is 55-65% by visual estimate.  Prox Cx lesion is 30% stenosed.  Mid Cx to Dist Cx lesion is 90% stenosed.  Post intervention, there is a 0% residual stenosis.  A drug-eluting stent was successfully placed using a [place] [id number].  1.  Severe one-vessel coronary artery disease involving mid to distal left circumflex. 2.  Normal LV systolic function and normal left ventricular end-diastolic pressure at 10 mmHg 3.  Successful angioplasty and drug-eluting stent placement to the left circumflex. Recommendations: Eliquis can be resumed tomorrow morning if no bleeding  issues. Given that the patient is on long-term anticoagulation, recommend treatment with clopidogrel for 6 months without aspirin.  Aspirin can be discontinued before hospital discharge. I decreased the dose of furosemide to 40 mg twice daily considering normal LVEDP today. Likely discharge home tomorrow.   DG Chest Port 1 View  Result Date: 06/15/2019 CLINICAL DATA:  Chest pressure and shortness of breath. EXAM: PORTABLE CHEST 1 VIEW COMPARISON:  03/28/2018 FINDINGS: Lungs are adequately inflated without lobar consolidation or effusion. Subtle prominence of the perihilar vessels suggesting minimal vascular congestion. Borderline stable cardiomegaly. Remainder the exam is unchanged. IMPRESSION: Borderline stable  cardiomegaly with suggestion of minimal vascular congestion. Electronically Signed   By: Marin Olp M.D.   On: 06/15/2019 18:30   ECHOCARDIOGRAM LIMITED  Result Date: 06/16/2019    ECHOCARDIOGRAM LIMITED REPORT   Patient Name:   Ariana Riggs Date of Exam: 06/16/2019 Medical Rec #:  [id number]          Height:       66.0 in Accession #:    [id number]         Weight:       223.6 lb Date of Birth:  1960/11/13          BSA:          2.097 m Patient Age:    59 years           BP:           130/61 mmHg Patient Gender: F                  HR:           71 bpm. Exam Location:  [facility] Procedure: 2D Echo Indications:     CHF 428.31/ I50.31  History:         Patient has prior history of Echocardiogram examinations, most                  recent 06/20/2018.  Sonographer:     Arville Go RDCS Referring Phys:  [place] Diagnosing Phys: Skeet Latch MD IMPRESSIONS  1. Left ventricular ejection fraction, by estimation, is 55 to 60%. Left ventricular ejection fraction by PLAX is 45 %. The left ventricle has normal function. The left ventricle has no regional wall motion abnormalities. The left ventricular internal cavity size was mildly dilated. There is mild left ventricular hypertrophy.  2. Left atrial size was mildly dilated.  3. The inferior vena cava is normal in size with greater than 50% respiratory variability, suggesting right atrial pressure of 3 mmHg. FINDINGS  Left Ventricle: Left ventricular ejection fraction, by estimation, is 55 to 60%. Left ventricular ejection fraction by PLAX is 45 %. The left ventricle has normal function. The left ventricle has no regional wall motion abnormalities. The left ventricular internal cavity size was mildly dilated. There is mild left ventricular hypertrophy. Left Atrium: Left atrial size was mildly dilated. Venous: The inferior vena cava is normal in size with greater than 50% respiratory variability, suggesting right atrial pressure of 3 mmHg.  LEFT VENTRICLE  PLAX 2D LV EF:         Left ventricular ejection fraction by PLAX is 45 %. LVIDd:         5.63 cm LVIDs:         4.35 cm LV PW:         1.15 cm LV IVS:        0.95 cm LVOT diam:  1.90 cm LVOT Area:     2.84 cm  LV Volumes (MOD) LV vol d, MOD A2C: 107.0 ml LV vol d, MOD A4C: 82.9 ml LV vol s, MOD A2C: 28.1 ml LV vol s, MOD A4C: 35.3 ml LV SV MOD A2C:     78.9 ml LV SV MOD A4C:     82.9 ml LV SV MOD BP:      65.4 ml RIGHT VENTRICLE RV Basal diam:  3.24 cm LEFT ATRIUM             Index       RIGHT ATRIUM           Index LA diam:        4.70 cm 2.24 cm/m  RA Area:     18.50 cm LA Vol (A2C):   70.9 ml 33.81 ml/m RA Volume:   50.40 ml  24.04 ml/m LA Vol (A4C):   63.8 ml 30.43 ml/m LA Biplane Vol: 67.1 ml 32.00 ml/m   AORTA Ao Root diam: 2.90 cm Ao Asc diam:  3.00 cm  SHUNTS Systemic Diam: 1.90 cm Skeet Latch MD Electronically signed by Skeet Latch MD Signature Date/Time: 06/16/2019/12:14:45 PM    Final      Subjective: Seen and examined.  No chest pain symptoms.  Right radial cath site appears clean.  Discharge Exam: Vitals:   06/19/19 0407 06/19/19 0745  BP: 129/61 131/78  Pulse: 72 74  Resp:  19  Temp: 97.8 F (36.6 C) 98 F (36.7 C)  SpO2: 90% 97%   Vitals:   06/18/19 2244 06/18/19 2257 06/19/19 0407 06/19/19 0745  BP:  128/75 129/61 131/78  Pulse: 79 70 72 74  Resp:  17  19  Temp:  98.8 F (37.1 C) 97.8 F (36.6 C) 98 F (36.7 C)  TempSrc:  Oral Oral   SpO2:  97% 90% 97%  Weight:   100.2 kg     General: Not distressed HEENT: Moist mucosa, supple neck Chest: Clear CVs: Normal S1-S2 GI: Soft, nondistended, nontender Musculoskeletal: Warm, right radial cath site with clean dressing    The results of significant diagnostics from this hospitalization (including imaging, microbiology, ancillary and laboratory) are listed below for reference.     Microbiology: Recent Results (from the past 240 hour(s))  Respiratory Panel by RT PCR (Flu A&B, Covid) -  Nasopharyngeal Swab     Status: None   Collection Time: 06/15/19  9:25 PM   Specimen: Nasopharyngeal Swab  Result Value Ref Range Status   SARS Coronavirus 2 by RT PCR NEGATIVE NEGATIVE Final    Comment: (NOTE) SARS-CoV-2 target nucleic acids are NOT DETECTED. The SARS-CoV-2 RNA is generally detectable in upper respiratoy specimens during the acute phase of infection. The lowest concentration of SARS-CoV-2 viral copies this assay can detect is 131 copies/mL. A negative result does not preclude SARS-Cov-2 infection and should not be used as the sole basis for treatment or other patient management decisions. A negative result may occur with  improper specimen collection/handling, submission of specimen other than nasopharyngeal swab, presence of viral mutation(s) within the areas targeted by this assay, and inadequate number of viral copies (<131 copies/mL). A negative result must be combined with clinical observations, patient history, and epidemiological information. The expected result is Negative. Fact Sheet for Patients:  PinkCheek.be Fact Sheet for Healthcare Providers:  GravelBags.it This test is not yet ap proved or cleared by the [place] FDA and  has been authorized for detection and/or diagnosis of  SARS-CoV-2 by FDA under an Emergency Use Authorization (EUA). This EUA will remain  in effect (meaning this test can be used) for the duration of the COVID-19 declaration under Section 564(b)(1) of the Act, 21 U.S.C. section 360bbb-3(b)(1), unless the authorization is terminated or revoked sooner.    Influenza A by PCR NEGATIVE NEGATIVE Final   Influenza B by PCR NEGATIVE NEGATIVE Final    Comment: (NOTE) The Xpert Xpress SARS-CoV-2/FLU/RSV assay is intended as an aid in  the diagnosis of influenza from Nasopharyngeal swab specimens and  should not be used as a sole basis for treatment. Nasal washings and  aspirates  are unacceptable for Xpert Xpress SARS-CoV-2/FLU/RSV  testing. Fact Sheet for Patients: PinkCheek.be Fact Sheet for Healthcare Providers: GravelBags.it This test is not yet approved or cleared by the [place] FDA and  has been authorized for detection and/or diagnosis of SARS-CoV-2 by  FDA under an Emergency Use Authorization (EUA). This EUA will remain  in effect (meaning this test can be used) for the duration of the  Covid-19 declaration under Section 564(b)(1) of the Act, 21  U.S.C. section 360bbb-3(b)(1), unless the authorization is  terminated or revoked. Performed at [facility], [address] [place], [place] [address]      Labs: BNP (last 3 results) Recent Labs    06/15/19 1720  BNP [id number]*   Basic Metabolic Panel: Recent Labs  Lab 06/15/19 1720 06/16/19 0101 06/17/19 0459 06/18/19 0631 06/19/19 0422  NA 141 142 141 141 139  K 3.5 3.5 3.4* 3.9 3.9  CL 102 102 105 104 103  CO2 28 29 27 30 27   GLUCOSE 122* 134* 134* 141* 124*  BUN 13 13 15 18 20   CREATININE 0.88 1.03* 1.01* 1.11* 1.04*  CALCIUM 9.4 8.9 9.3 9.3 9.3   Liver Function Tests: No results for input(s): AST, ALT, ALKPHOS, BILITOT, PROT, ALBUMIN in the last 168 hours. No results for input(s): LIPASE, AMYLASE in the last 168 hours. No results for input(s): AMMONIA in the last 168 hours. CBC: Recent Labs  Lab 06/15/19 1720 06/16/19 0101 06/17/19 0459 06/18/19 0631 06/19/19 0422  WBC 7.6 6.4 6.1 5.7 7.3  NEUTROABS 4.3  --   --   --   --   HGB 13.4 13.0 13.9 13.6 14.1  HCT 37.3 36.7 38.2 39.5 38.6  MCV 90.1 90.8 88.6 91.0 88.5  PLT 174 160 154 166 165   Cardiac Enzymes: No results for input(s): CKTOTAL, CKMB, CKMBINDEX, TROPONINI in the last 168 hours. BNP: Invalid input(s): POCBNP CBG: No results for input(s): GLUCAP in the last 168 hours. D-Dimer No results for input(s): DDIMER in the last 72 hours. Hgb A1c No  results for input(s): HGBA1C in the last 72 hours. Lipid Profile No results for input(s): CHOL, HDL, LDLCALC, TRIG, CHOLHDL, LDLDIRECT in the last 72 hours. Thyroid function studies No results for input(s): TSH, T4TOTAL, T3FREE, THYROIDAB in the last 72 hours.  Invalid input(s): FREET3 Anemia work up No results for input(s): VITAMINB12, FOLATE, FERRITIN, TIBC, IRON, RETICCTPCT in the last 72 hours. Urinalysis No results found for: COLORURINE, APPEARANCEUR, [place], [place], [place], [place], [place], [place], PROTEINUR, UROBILINOGEN, NITRITE, LEUKOCYTESUR Sepsis Labs Invalid input(s): PROCALCITONIN,  WBC,  LACTICIDVEN Microbiology Recent Results (from the past 240 hour(s))  Respiratory Panel by RT PCR (Flu A&B, Covid) - Nasopharyngeal Swab     Status: None   Collection Time: 06/15/19  9:25 PM   Specimen: Nasopharyngeal Swab  Result Value Ref Range Status   SARS Coronavirus  2 by RT PCR NEGATIVE NEGATIVE Final    Comment: (NOTE) SARS-CoV-2 target nucleic acids are NOT DETECTED. The SARS-CoV-2 RNA is generally detectable in upper respiratoy specimens during the acute phase of infection. The lowest concentration of SARS-CoV-2 viral copies this assay can detect is 131 copies/mL. A negative result does not preclude SARS-Cov-2 infection and should not be used as the sole basis for treatment or other patient management decisions. A negative result may occur with  improper specimen collection/handling, submission of specimen other than nasopharyngeal swab, presence of viral mutation(s) within the areas targeted by this assay, and inadequate number of viral copies (<131 copies/mL). A negative result must be combined with clinical observations, patient history, and epidemiological information. The expected result is Negative. Fact Sheet for Patients:  PinkCheek.be Fact Sheet for Healthcare Providers:  GravelBags.it This test is  not yet ap proved or cleared by the [place] FDA and  has been authorized for detection and/or diagnosis of SARS-CoV-2 by FDA under an Emergency Use Authorization (EUA). This EUA will remain  in effect (meaning this test can be used) for the duration of the COVID-19 declaration under Section 564(b)(1) of the Act, 21 U.S.C. section 360bbb-3(b)(1), unless the authorization is terminated or revoked sooner.    Influenza A by PCR NEGATIVE NEGATIVE Final   Influenza B by PCR NEGATIVE NEGATIVE Final    Comment: (NOTE) The Xpert Xpress SARS-CoV-2/FLU/RSV assay is intended as an aid in  the diagnosis of influenza from Nasopharyngeal swab specimens and  should not be used as a sole basis for treatment. Nasal washings and  aspirates are unacceptable for Xpert Xpress SARS-CoV-2/FLU/RSV  testing. Fact Sheet for Patients: PinkCheek.be Fact Sheet for Healthcare Providers: GravelBags.it This test is not yet approved or cleared by the [place] FDA and  has been authorized for detection and/or diagnosis of SARS-CoV-2 by  FDA under an Emergency Use Authorization (EUA). This EUA will remain  in effect (meaning this test can be used) for the duration of the  Covid-19 declaration under Section 564(b)(1) of the Act, 21  U.S.C. section 360bbb-3(b)(1), unless the authorization is  terminated or revoked. Performed at [facility], [address] [place], [place] [address]      Time coordinating discharge: 35 minutes  SIGNED:   Louellen Molder, MD  [facility] 06/19/2019, 11:39 AM Pager   If 7PM-7AM, please contact night-coverage [email or web address] Password [email or web address]

## 2019-06-19 NOTE — Discharge Instructions (Signed)
Coronary Angioplasty, Care After This sheet gives you information about how to care for yourself after your procedure. Your health care provider may also give you more specific instructions. If you have problems or questions, contact your health care provider. What can I expect after the procedure? After your procedure, it is common to have:  Bruising at the catheter insertion site. This usually fades within 1-2 weeks.  Blood collecting in the tissue (hematoma) that may be painful to the touch. It should become smaller and less tender within 1-2 weeks. Follow these instructions at home: Medicines  Take over-the-counter and prescription medicines only as told by your health care provider.  Blood thinners may be prescribed after your procedure to improve blood flow. Bathing  You may shower 24-48 hours after the procedure or as told by your health care provider.  Do not take baths, swim, or use a hot tub until your health care provider approves. Insertion site care   Follow instructions from your health care provider about how to take care of your insertion site. Make sure you: ? Wash your hands with soap and water before you change your bandage (dressing). If soap and water are not available, use hand sanitizer. ? Change your dressing as told by your health care provider. ? Gently wash the site with plain soap and water. ? Use a clean towel to pat the area dry. ? Do not rub the site, because this may cause bleeding. ? Do not apply powder or lotion to the site.  Check your insertion site every day for signs of infection. Check for: ? More redness, swelling, or pain. ? More fluid or blood. ? Warmth. ? Pus or a bad smell. Lifestyle   Make any lifestyle changes as recommended by your health care provider. This may include: ? Not using any products that contain nicotine or tobacco, such as cigarettes and e-cigarettes. If you need help quitting, ask your health care  provider. ? Managing your weight. ? Getting regular exercise. ? Managing your blood pressure. ? Limiting your alcohol intake. ? Managing other health problems, such as diabetes.  Eat a heart-healthy diet. This should include plenty of fresh fruits and vegetables. Avoid foods that are: ? High in salt (sodium). ? Canned or highly processed. ? High in saturated fat or sugar. ? Fried. General instructions  Do not lift over 10 lb (4.5 kg) for 5 days after your procedure or as told by your health care provider.  Ask your health care provider when it is okay to: ? Return to work or school. ? Resume usual physical activities or sports. ? Resume sexual activity.  Keep all follow-up visits as told by your health care provider. This is important. Contact a health care provider if:  You have a fever.  You have chills.  You have increased bleeding from the insertion site. Hold pressure on the site. Get help right away if:  You develop chest pain or shortness of breath, feel faint, or pass out.  You have unusual pain at the insertion site.  You have redness, warmth, or swelling at the insertion site.  You have drainage (other than a small amount of blood on the dressing) from the insertion site.  The insertion site is bleeding, and the bleeding does not stop after 30 minutes of holding steady pressure on the site.  You develop bleeding from any other place, such as from the rectum. There may be bright red blood in your urine or stool, or it  may appear as black, tarry stool. This information is not intended to replace advice given to you by your health care provider. Make sure you discuss any questions you have with your health care provider. Document Revised: 01/14/2017 Document Reviewed: 09/07/2015 Elsevier Patient Education  2020 [employer]  

## 2019-06-24 ENCOUNTER — Other Ambulatory Visit: Payer: Self-pay | Admitting: Internal Medicine

## 2019-06-28 ENCOUNTER — Ambulatory Visit (INDEPENDENT_AMBULATORY_CARE_PROVIDER_SITE_OTHER): Payer: 59 | Admitting: Internal Medicine

## 2019-06-28 ENCOUNTER — Other Ambulatory Visit: Payer: Self-pay

## 2019-06-28 ENCOUNTER — Encounter: Payer: Self-pay | Admitting: Internal Medicine

## 2019-06-28 VITALS — BP 90/50 | HR 64 | Ht 65.0 in | Wt 221.1 lb

## 2019-06-28 DIAGNOSIS — I25110 Atherosclerotic heart disease of native coronary artery with unstable angina pectoris: Secondary | ICD-10-CM

## 2019-06-28 DIAGNOSIS — I4819 Other persistent atrial fibrillation: Secondary | ICD-10-CM | POA: Diagnosis not present

## 2019-06-28 DIAGNOSIS — K7460 Unspecified cirrhosis of liver: Secondary | ICD-10-CM

## 2019-06-28 DIAGNOSIS — I5022 Chronic systolic (congestive) heart failure: Secondary | ICD-10-CM

## 2019-06-28 MED ORDER — LOSARTAN POTASSIUM 25 MG PO TABS
12.5000 mg | ORAL_TABLET | Freq: Every day | ORAL | 1 refills | Status: DC
Start: 2019-06-28 — End: 2020-01-09

## 2019-06-28 NOTE — Progress Notes (Signed)
Follow-up Outpatient Visit Date: 06/28/2019  Primary Care Provider: Ezequiel Kayser, MD [facility] [place] [address]  Chief Complaint: Follow-up CAD and heart failure  HPI:  Ariana Riggs is a 59 y.o. female with history of coronary artery disease status post recent PCI to the LCx in the setting of unstable angina, chronic systolic heart failure with normalization of LVEF, persistent atrial fibrillation, remote ASD repair, cirrhosis, bipolar disorder, obstructive sleep apnea, and polysubstance abuse, who presents for follow-up of CAD, heart failure, and atrial fibrillation.  I last saw Ariana Riggs about 2 weeks ago, which time she complained of increasing chest pain concerning for unstable angina.  She was directly admitted to the hospital and subsequently underwent catheterization revealing 90% mid/distal LCx stenosis.  This was treated with a single drug-eluting stent.  Today, Ariana Riggs reports feeling well without further chest pain.  She has stable exertional dyspnea but has been trying to walk regularly for at least 15 minutes/day.  She is concerned about being able to participate in cardiac rehab due to her copay.  She denies palpitations, lightheadedness, and edema.  She has noticed some vaginal/urethral spotting since clopidogrel was added to apixaban.  --------------------------------------------------------------------------------------------------  Cardiovascular History & Procedures: Cardiovascular Problems:  Systolic heart failure (presumed nonischemic with low-risk MPI - 04/2018)  Paroxysmal atrial fibrillation  ASD s/p repair (~1980)  Risk Factors:  Hyperlipidemia, obesity, and history of polysubstance abuse  Cath/PCI:  LHC/PCI (06/18/2019): Severe single-vessel coronary artery disease with 30% proximal and 90% mid/distal LCx stenoses.  LVEF 55-65%.  Successful PCI to mid/distal LCx with Resolute Onyx 2.75 x 18 mm drug-eluting stent.  CV  Surgery:  None  EP Procedures and Devices:  None  Non-Invasive Evaluation(s):  Limited echocardiogram (06/16/2019): Mildly dilated left ventricle with mild LVH.  LVEF 55-60% with normal wall motion.  Mild left atrial enlargement.  Echocardiogram (06/20/2018): Mildly dilated left ventricle with upper normal wall thickness. LVEF 55-60% with grade 2 diastolic dysfunction and elevated filling pressure. Moderately enlarged right ventricle with normal contraction. Mildly elevated RVSP (35 to 40 mmHg). Moderate left atrial and mild right atrial enlargement. Moderate mitral and tricuspid regurgitation.  Pharmacologic MPI (04/28/2018): Small in size, mild in severity, fixed apical inferior defect most likely representing attenuation artifact. No ischemia. LVEF 55%.  Echocardiogram (03/28/2018): Normal LV size with LVEF of 30-35%. Moderately dilated RV with moderately reduced systolic function. Moderate to severe biatrial enlargement. Moderate to severe atrial regurgitation. Severe tricuspid regurgitation.  Recent CV Pertinent Labs: Lab Results  Component Value Date   CHOL 116 06/16/2019   HDL 32 (L) 06/16/2019   LDLCALC 38 06/16/2019   TRIG 231 (H) 06/16/2019   CHOLHDL 3.6 06/16/2019   INR 1.2 06/15/2019   BNP 137.0 (H) 06/15/2019   K 3.9 06/19/2019   MG 2.2 06/20/2018   BUN 20 06/19/2019   BUN 14 10/13/2018   CREATININE 1.04 (H) 06/19/2019    Past medical and surgical history were reviewed and updated in EPIC.  Current Meds  Medication Sig  . albuterol (PROVENTIL HFA;VENTOLIN HFA) 108 (90 Base) MCG/ACT inhaler Inhale into the lungs every 6 (six) hours as needed for wheezing or shortness of breath.  Marland Kitchen albuterol (PROVENTIL) (2.5 MG/3ML) 0.083% nebulizer solution Inhale 3 mLs into the lungs every 6 (six) hours as needed for wheezing.  Marland Kitchen allopurinol (ZYLOPRIM) 300 MG tablet Take 0.5 tablets (150 mg total) by mouth daily. (Patient taking differently: Take 300 mg by mouth daily. )    .  amiodarone (PACERONE) 100 MG tablet Take 1 tablet (100 mg total) by mouth daily.  Marland Kitchen apixaban (ELIQUIS) 5 MG TABS tablet Take 1 tablet (5 mg total) by mouth 2 (two) times daily.  Marland Kitchen atorvastatin (LIPITOR) 10 MG tablet Take 1 tablet (10 mg total) by mouth daily.  . citalopram (CELEXA) 20 MG tablet Take 20 mg by mouth daily.  . clopidogrel (PLAVIX) 75 MG tablet Take 1 tablet (75 mg total) by mouth daily with breakfast.  . colchicine 0.6 MG tablet Take 0.6 mg by mouth as needed.  . fluticasone (FLONASE) 50 MCG/ACT nasal spray Place 2 sprays into both nostrils 2 (two) times daily.  . furosemide (LASIX) 40 MG tablet Take 1 tablet (40 mg total) by mouth 2 (two) times daily.  Marland Kitchen lactulose (CHRONULAC) 10 GM/15ML solution 60 g 2 (two) times a day.   . lansoprazole (PREVACID) 30 MG capsule Take 30 mg by mouth daily at 12 noon.  Marland Kitchen LORazepam (ATIVAN) 1 MG tablet TAKE 1 TABLET BY MOUTH EVERY MORNING AND EVERY EVENING AS DIRECTED  . losartan (COZAAR) 25 MG tablet Take 1 tablet (25 mg total) by mouth daily.  . metoprolol succinate (TOPROL-XL) 25 MG 24 hr tablet TAKE 1/2 TABLET BY MOUTH EVERY DAY  . montelukast (SINGULAIR) 10 MG tablet Take 10 mg by mouth Nightly.  . multivitamin-iron-minerals-folic acid (CENTRUM) chewable tablet Chew 1 tablet by mouth daily.  . potassium chloride (KLOR-CON) 20 MEQ packet Take 20 mEq by mouth 2 (two) times daily.  Marland Kitchen spironolactone (ALDACTONE) 25 MG tablet Take 1 tablet (25 mg total) by mouth daily.  . Suvorexant (BELSOMRA) 10 MG TABS TAKE 1/2   1 TABLET BY MOUTH AT BEDTIME AT THE SAME TIME EVERY NIGHT  . umeclidinium-vilanterol (ANORO ELLIPTA) 62.5-25 MCG/INH AEPB Inhale 1 puff into the lungs daily.  Marland Kitchen XIFAXAN 550 MG TABS tablet Take 550 mg by mouth 2 (two) times daily.    Allergies: Melatonin, Doxycycline, Paxil [paroxetine hcl], Penicillins, and Sulfa antibiotics  Social History   Tobacco Use  . Smoking status: Former Smoker    Packs/day: 0.25    Years: 40.00     Pack years: 10.00    Types: Cigarettes    Quit date: 03/13/2018    Years since quitting: 1.2  . Smokeless tobacco: Never Used  . Tobacco comment: quit last year  Substance Use Topics  . Alcohol use: Not Currently    Alcohol/week: 4.0 standard drinks    Types: 4 Cans of beer per week    Comment: last year  . Drug use: Not Currently    Types: "Crack" cocaine    Comment: quit 11 years ago    Family History  Problem Relation Age of Onset  . Hypertension Mother   . Diabetes Mother   . Peripheral Artery Disease Mother   . Dementia Father   . Hypertension Father   . Peripheral Artery Disease Sister   . Breast cancer Neg Hx     Review of Systems: A 12-system review of systems was performed and was negative except as noted in the HPI.  --------------------------------------------------------------------------------------------------  Physical Exam: BP (!) 90/50 (BP Location: Right Arm, Patient Position: Sitting, Cuff Size: Normal)   Pulse 64   Ht 5\' 5"  (1.651 m)   Wt 221 lb 2 oz (100.3 kg)   LMP  (LMP Unknown)   SpO2 98%   BMI 36.80 kg/m    Repeat BP 104/52  General:  NAD. Neck: No JVD or HJR. Lungs: CTA bilaterally. Heart:  Distant heart sounds w/o murmurs/, rubs, or gallops. Abd: Obese but soft, NT/ND. Extremities: No LE edema.  Right radial arteriotomy is well-healed.  EKG:  NSR with anterolateral ST/T changes, slightly more pronounced than prior tracing from 06/19/19.  Lab Results  Component Value Date   WBC 7.3 06/19/2019   HGB 14.1 06/19/2019   HCT 38.6 06/19/2019   MCV 88.5 06/19/2019   PLT 165 06/19/2019    Lab Results  Component Value Date   NA 139 06/19/2019   K 3.9 06/19/2019   CL 103 06/19/2019   CO2 27 06/19/2019   BUN 20 06/19/2019   CREATININE 1.04 (H) 06/19/2019   GLUCOSE 124 (H) 06/19/2019   ALT 29 06/20/2018    Lab Results  Component Value Date   CHOL 116 06/16/2019   HDL 32 (L) 06/16/2019   LDLCALC 38 06/16/2019   TRIG 231 (H)  06/16/2019   CHOLHDL 3.6 06/16/2019    --------------------------------------------------------------------------------------------------  ASSESSMENT AND PLAN: Coronary artery disease with unstable angina: Ariana Riggs is doing well without recurrent angina s/p PCI to mid/distal LCx.  We will plan to continue apixaban and clopidogrel for up to 12 months, if tolerated.  Continue atorvastatin for secondary prevention.  Chronic HFrEF with normalization of LVEF: Ariana Riggs appears euvolemic with stable NYHA class II symptoms.  Due to soft BP, I will decrease losartan to 12.5 mg daily.  Continue current doses of metoprolol succinate, furosemide, and spironolactone.  Persistent atrial fibrillation: Patient remains in sinus rhythm.  We will continue amiodarone 100 mg daily and apixaban.  Cirrhosis: Patient appears well-compensated.  No medication changes at this time.  Follow-up: Return to clinic in 3 months.  Ariana Bush, MD 06/28/2019 2:18 PM

## 2019-06-28 NOTE — Patient Instructions (Signed)
Medication Instructions:  Your physician has recommended you make the following change in your medication:  1- DECREASE Losartan to 12.5 mg (0.5 tablet) by mouth once a day.  *If you need a refill on your cardiac medications before your next appointment, please call your pharmacy*   Follow-Up: At [facility], you and your health needs are our priority.  As part of our continuing mission to provide you with exceptional heart care, we have created designated Provider Care Teams.  These Care Teams include your primary Cardiologist (physician) and Advanced Practice Providers (APPs -  Physician Assistants and Nurse Practitioners) who all work together to provide you with the care you need, when you need it.  We recommend signing up for the patient portal called "MyChart".  Sign up information is provided on this After Visit Summary.  MyChart is used to connect with patients for Virtual Visits (Telemedicine).  Patients are able to view lab/test results, encounter notes, upcoming appointments, etc.  Non-urgent messages can be sent to your provider as well.   To learn more about what you can do with MyChart, go to [email or web address].    Your next appointment:   3 month(s)  The format for your next appointment:   In Person  Provider:    You may see Nelva Bush, MD or one of the following Advanced Practice Providers on your designated Care Team:    Murray Hodgkins, NP  Christell Faith, PA-C  Marrianne Mood, PA-C

## 2019-07-03 ENCOUNTER — Ambulatory Visit: Payer: 59 | Admitting: Family

## 2019-07-03 ENCOUNTER — Other Ambulatory Visit: Payer: Self-pay | Admitting: Internal Medicine

## 2019-07-03 MED ORDER — FUROSEMIDE 40 MG PO TABS: 40.0000 mg | ORAL_TABLET | Freq: Two times a day (BID) | ORAL | 0 refills | Status: DC

## 2019-07-03 NOTE — Telephone Encounter (Signed)
Feet are swollen - would like to go back on this fluid pill  *STAT* If patient is at the pharmacy, call can be transferred to refill team.   1. Which medications need to be refilled? (please list name of each medication and dose if known) furosemide (LASIX) 40 MG   2. Which pharmacy/location (including street and city if local pharmacy) is medication to be sent to? [place] [facility] [address]  3. Do they need a 30 day or 90 day supply? 90 day

## 2019-07-09 ENCOUNTER — Other Ambulatory Visit: Payer: Self-pay | Admitting: Gastroenterology

## 2019-07-09 DIAGNOSIS — K7030 Alcoholic cirrhosis of liver without ascites: Secondary | ICD-10-CM

## 2019-07-09 DIAGNOSIS — R1011 Right upper quadrant pain: Secondary | ICD-10-CM

## 2019-07-12 ENCOUNTER — Ambulatory Visit
Admission: RE | Admit: 2019-07-12 | Discharge: 2019-07-12 | Disposition: A | Discharge status: Home / Self Care | Payer: 59 | Source: Ambulatory Visit | Attending: Gastroenterology | Admitting: Gastroenterology

## 2019-07-12 DIAGNOSIS — K7030 Alcoholic cirrhosis of liver without ascites: Secondary | ICD-10-CM

## 2019-07-12 DIAGNOSIS — R1011 Right upper quadrant pain: Secondary | ICD-10-CM | POA: Diagnosis present

## 2019-07-12 MED ORDER — IOHEXOL 300 MG/ML  SOLN
100.0000 mL | Freq: Once | INTRAMUSCULAR | Status: AC | PRN
  Administered 2019-07-12: 100 mL via INTRAVENOUS

## 2019-07-24 ENCOUNTER — Other Ambulatory Visit: Payer: Self-pay | Admitting: Internal Medicine

## 2019-07-25 ENCOUNTER — Other Ambulatory Visit: Payer: Self-pay | Admitting: Internal Medicine

## 2019-07-25 DIAGNOSIS — Z1231 Encounter for screening mammogram for malignant neoplasm of breast: Secondary | ICD-10-CM

## 2019-08-06 ENCOUNTER — Ambulatory Visit: Payer: 59 | Admitting: Internal Medicine

## 2019-08-16 ENCOUNTER — Other Ambulatory Visit: Payer: Self-pay | Admitting: Internal Medicine

## 2019-08-18 ENCOUNTER — Telehealth: Payer: Self-pay | Admitting: Internal Medicine

## 2019-08-21 MED ORDER — FUROSEMIDE 40 MG PO TABS: 40.0000 mg | ORAL_TABLET | Freq: Two times a day (BID) | ORAL | 2 refills | Status: DC

## 2019-08-21 NOTE — Telephone Encounter (Signed)
Spoke to patient. States since discharge from the hospital post catheterization she's been taking furosemide 20 mg by mouth two times a day and potassium 20 mEq daily.   Routing to Dr End to clarify if this is ok to continue.

## 2019-08-21 NOTE — Telephone Encounter (Signed)
Please verify how pt is taking FUROSEMIDE 40MG   BMB:OMQT 2 tablets by mouth in the morning and 1 tablet by mouth in the afternoon. Last OV Note: Take 2 tablets daily. No new changes to medications.  Thank you

## 2019-08-21 NOTE — Telephone Encounter (Signed)
Called patient back. She looked at her bottle while I was on the phone with her and it is actually a 40 mg tablet. Therefore, patient is taking furosemide 40 mg two times a day. She was appreciative.  Refill sent to [facility] with message that patient will call when refills are needed. She has plenty of pills at home right now.

## 2019-08-21 NOTE — Telephone Encounter (Signed)
My most recent office note and Dr. Tyrell Antonio cath note both mention furosemide 40 mg BID.  Not sure why the patient has been taking 20 mg twice daily.  If her volume status is okay, she can continue with 20 mg BID.  Nelva Bush, MD [facility]

## 2019-09-11 ENCOUNTER — Other Ambulatory Visit: Payer: Self-pay | Admitting: Internal Medicine

## 2019-09-19 ENCOUNTER — Other Ambulatory Visit: Payer: Self-pay | Admitting: Internal Medicine

## 2019-10-16 ENCOUNTER — Other Ambulatory Visit: Payer: Self-pay | Admitting: Internal Medicine

## 2019-10-17 ENCOUNTER — Ambulatory Visit: Payer: 59 | Admitting: Nurse Practitioner

## 2019-11-07 ENCOUNTER — Ambulatory Visit (INDEPENDENT_AMBULATORY_CARE_PROVIDER_SITE_OTHER): Payer: 59 | Admitting: Nurse Practitioner

## 2019-11-07 ENCOUNTER — Other Ambulatory Visit: Payer: Self-pay | Admitting: Internal Medicine

## 2019-11-07 ENCOUNTER — Other Ambulatory Visit: Payer: Self-pay

## 2019-11-07 ENCOUNTER — Encounter: Payer: Self-pay | Admitting: Nurse Practitioner

## 2019-11-07 VITALS — BP 110/60 | HR 70 | Ht 65.0 in | Wt 224.0 lb

## 2019-11-07 DIAGNOSIS — I25119 Atherosclerotic heart disease of native coronary artery with unspecified angina pectoris: Secondary | ICD-10-CM

## 2019-11-07 DIAGNOSIS — Z720 Tobacco use: Secondary | ICD-10-CM

## 2019-11-07 DIAGNOSIS — I5032 Chronic diastolic (congestive) heart failure: Secondary | ICD-10-CM

## 2019-11-07 DIAGNOSIS — R928 Other abnormal and inconclusive findings on diagnostic imaging of breast: Secondary | ICD-10-CM

## 2019-11-07 DIAGNOSIS — E785 Hyperlipidemia, unspecified: Secondary | ICD-10-CM | POA: Diagnosis not present

## 2019-11-07 DIAGNOSIS — I4819 Other persistent atrial fibrillation: Secondary | ICD-10-CM | POA: Diagnosis not present

## 2019-11-07 DIAGNOSIS — I428 Other cardiomyopathies: Secondary | ICD-10-CM
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
## 2019-11-07 NOTE — Patient Instructions (Signed)
Medication Instructions:  Your physician recommends that you continue on your current medications as directed. Please refer to the Current Medication list given to you today.  *If you need a refill on your cardiac medications before your next appointment, please call your pharmacy*   Lab Work: None Ordered If you have labs (blood work) drawn today and your tests are completely normal, you will receive your results only by: Marland Kitchen MyChart Message (if you have MyChart) OR . A paper copy in the mail If you have any lab test that is abnormal or we need to change your treatment, we will call you to review the results.   Testing/Procedures: None Ordered   Follow-Up: At [facility], you and your health needs are our priority.  As part of our continuing mission to provide you with exceptional heart care, we have created designated Provider Care Teams.  These Care Teams include your primary Cardiologist (physician) and Advanced Practice Providers (APPs -  Physician Assistants and Nurse Practitioners) who all work together to provide you with the care you need, when you need it.  We recommend signing up for the patient portal called "MyChart".  Sign up information is provided on this After Visit Summary.  MyChart is used to connect with patients for Virtual Visits (Telemedicine).  Patients are able to view lab/test results, encounter notes, upcoming appointments, etc.  Non-urgent messages can be sent to your provider as well.   To learn more about what you can do with MyChart, go to [email or web address].    Your next appointment:   6 month(s)  The format for your next appointment:   In Person  Provider:   Nelva Bush, MD   Other Instructions

## 2019-11-19 ENCOUNTER — Ambulatory Visit
Admission: RE | Admit: 2019-11-19 | Discharge: 2019-11-19 | Disposition: A | Discharge status: Home / Self Care | Payer: 59 | Source: Ambulatory Visit | Attending: Internal Medicine | Admitting: Internal Medicine

## 2019-11-19 ENCOUNTER — Other Ambulatory Visit: Payer: Self-pay | Admitting: Internal Medicine

## 2019-11-19 ENCOUNTER — Other Ambulatory Visit: Payer: Self-pay

## 2019-11-19 DIAGNOSIS — R928 Other abnormal and inconclusive findings on diagnostic imaging of breast: Secondary | ICD-10-CM

## 2019-11-19 DIAGNOSIS — Z1231 Encounter for screening mammogram for malignant neoplasm of breast: Secondary | ICD-10-CM

## 2019-12-04 ENCOUNTER — Other Ambulatory Visit: Payer: Self-pay | Admitting: Internal Medicine

## 2019-12-04 IMAGING — CT CT ABD-PELV W/ CM
2 of 5 series · 17 of 46 positions shown, 19 images · IV contrast (iopamidol)
Comparison: 06/19/2015

CLINICAL DATA: Bilateral abdominal pain and bloating.

EXAM:
CT ABDOMEN AND PELVIS WITH CONTRAST
TECHNIQUE: Multidetector CT imaging of the abdomen and pelvis was performed
using the standard protocol following bolus administration of
intravenous contrast.
CONTRAST:  100mL ERQ6XH-144 IOPAMIDOL (ERQ6XH-144) INJECTION 61%

[Series 2: routine abd/pel with · axial · 0.98mm/px · z∈[-875,-440]mm · 14 of 97 slices shown, 16 images]
[im 5/97  soft-tissue]
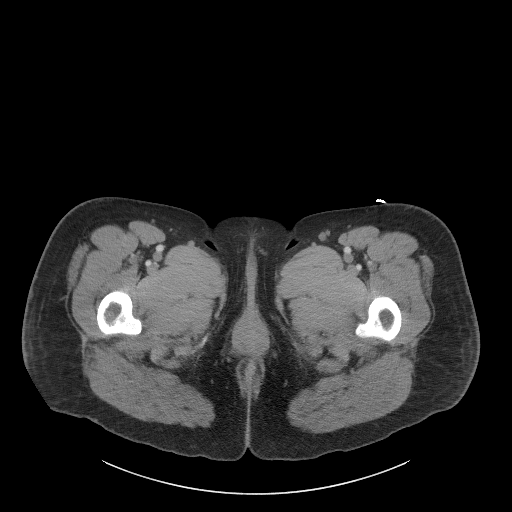
[im 5/97  bone]
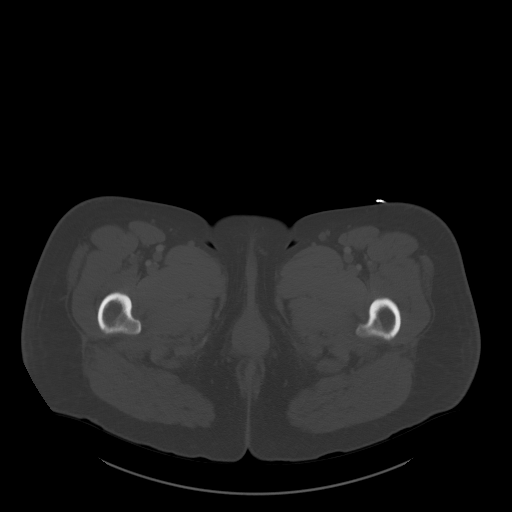
[im 15/97  soft-tissue]
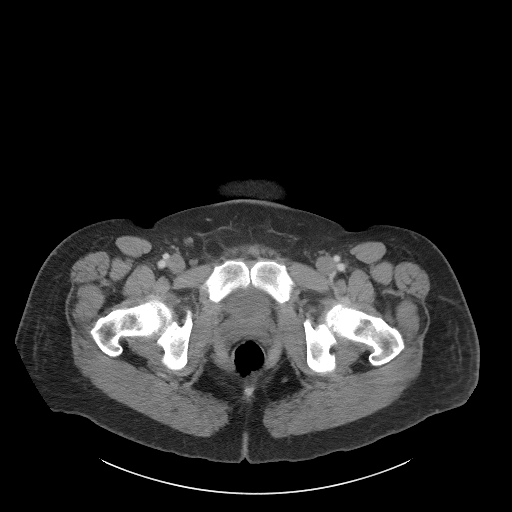
[im 20/97  soft-tissue]
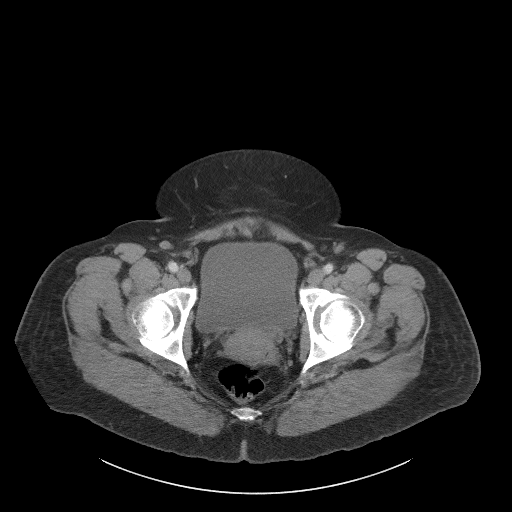
[im 25/97  soft-tissue]
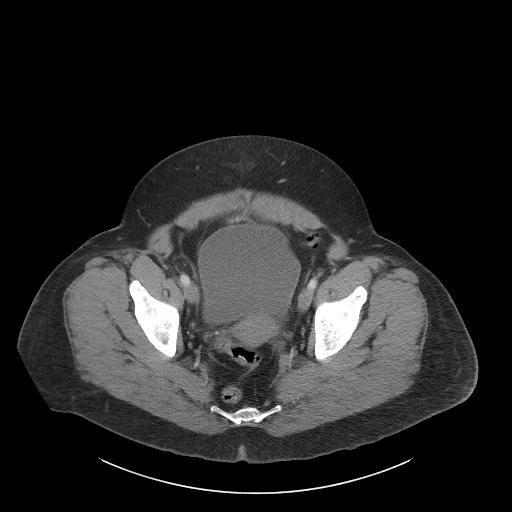
[im 34/97  soft-tissue]
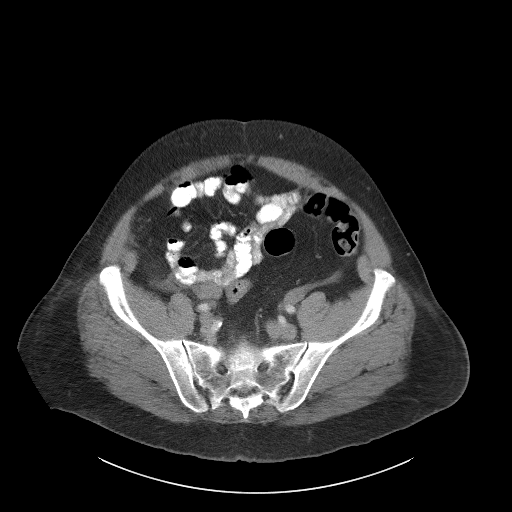
[im 39/97  soft-tissue]
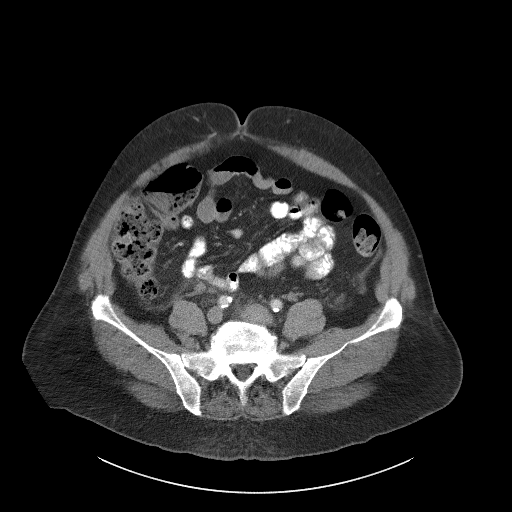
[im 44/97  soft-tissue]
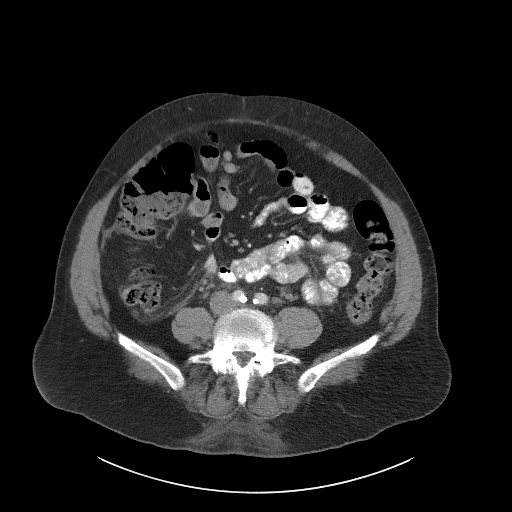
[im 53/97  soft-tissue]
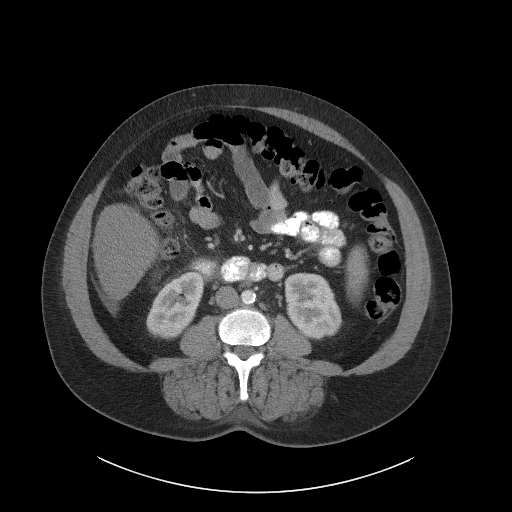
[im 58/97  soft-tissue]
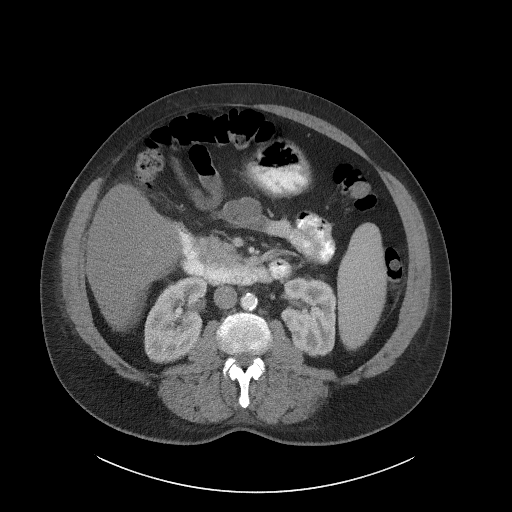
[im 58/97  bone]
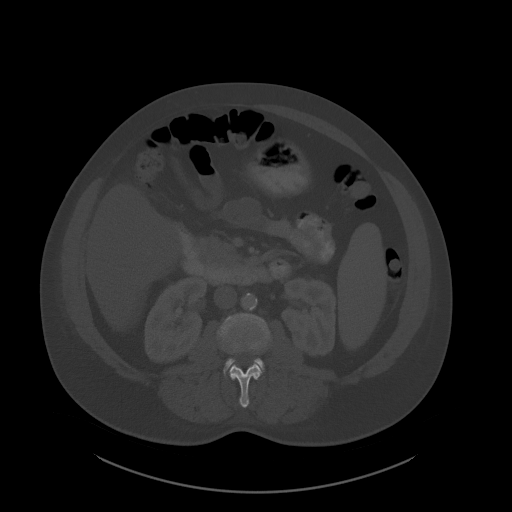
[im 63/97  soft-tissue]
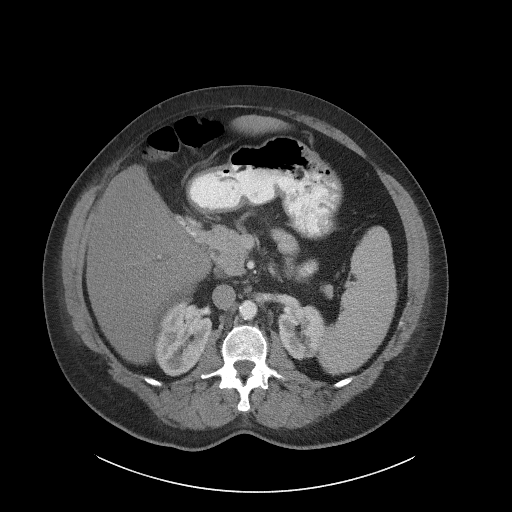
[im 73/97  soft-tissue]
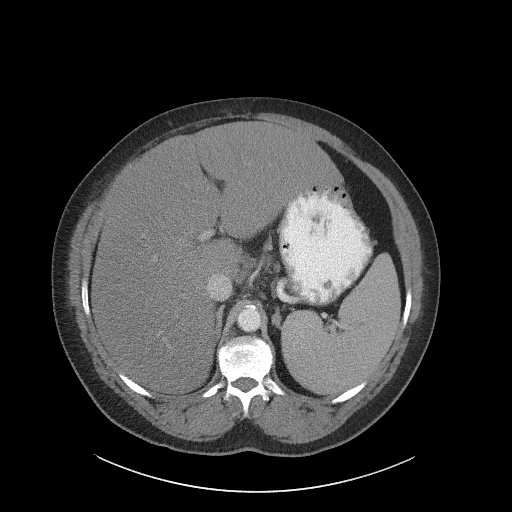
[im 77/97  soft-tissue]
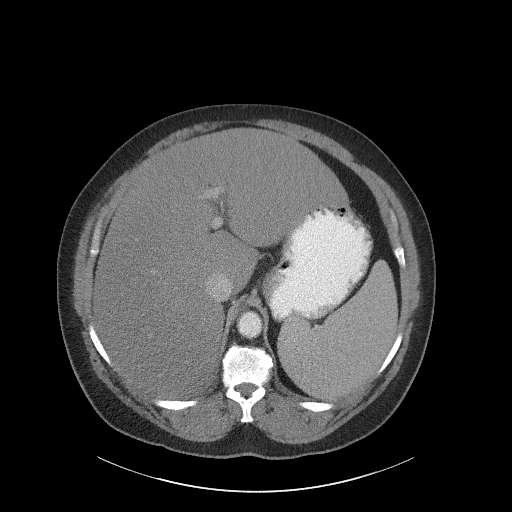
[im 82/97  soft-tissue]
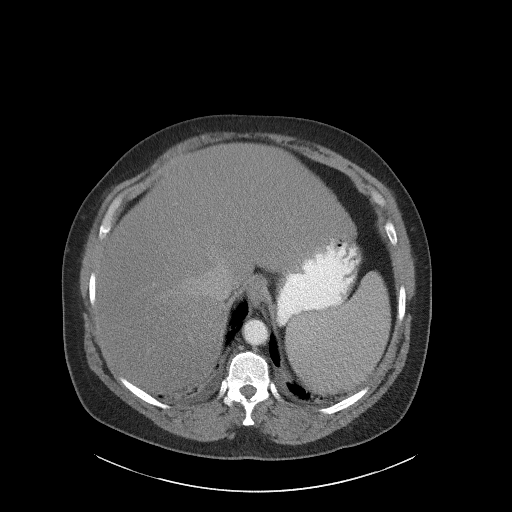
[im 92/97  soft-tissue]
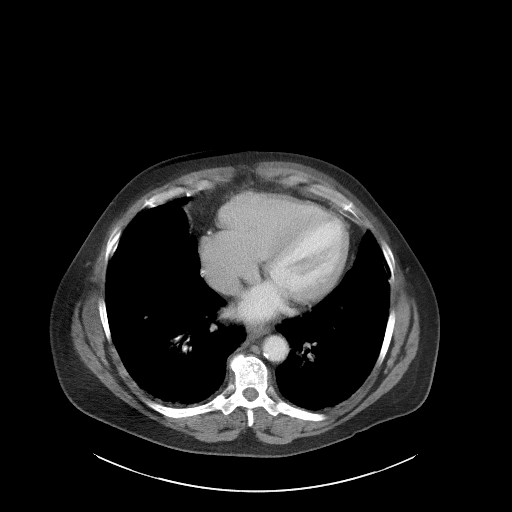

[Series 5: coronal st · coronal · 0.89mm/px · 3 of 118 slices shown]
[im 40/118  soft-tissue]
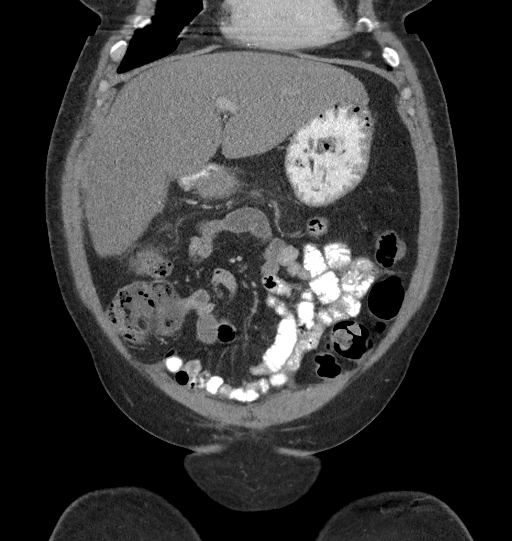
[im 53/118  soft-tissue]
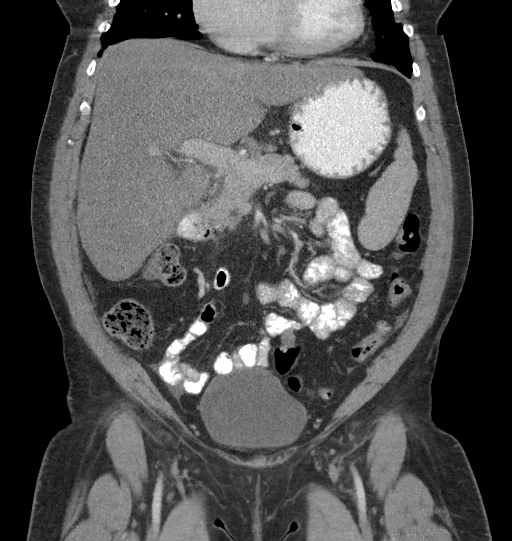
[im 66/118  soft-tissue]
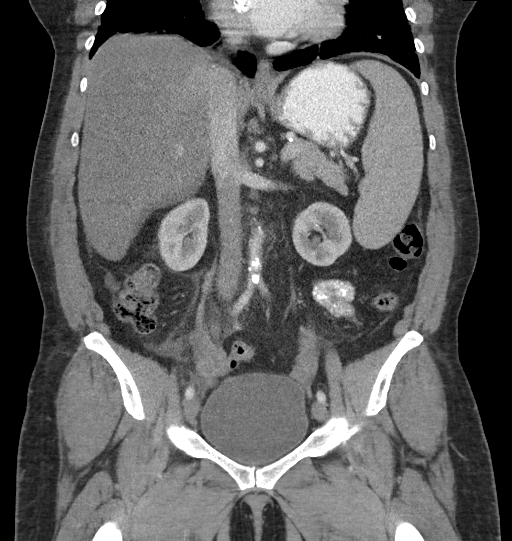

[17 of 46 positions shown; findings below may reference images not displayed]

FINDINGS: Lower Chest: No acute findings.

Hepatobiliary: No hepatic masses identified. Stable hepatomegaly and
diffuse steatosis. Mild enlargement of the caudate and left lobes
raises suspicion for cirrhosis. Prior cholecystectomy. No evidence
of biliary obstruction.

Pancreas:  No mass or inflammatory changes.

Spleen: Mild increase in splenomegaly since previous study, with
length currently measuring 16 cm compared to 14 cm previously. No
splenic masses identified.

Adrenals/Urinary Tract: No masses identified. Stable mild renal
parenchymal scarring. No evidence of hydronephrosis. Unremarkable
unopacified urinary bladder.

Stomach/Bowel: No evidence of obstruction, inflammatory process or
abnormal fluid collections. Normal appendix visualized.

Vascular/Lymphatic: No pathologically enlarged lymph nodes. No
abdominal aortic aneurysm. Aortic atherosclerosis.

Reproductive:  No mass or other significant abnormality.

Other:  Minimal ascites is new since prior exam.

Musculoskeletal:  No suspicious bone lesions identified.
IMPRESSION: Stable hepatomegaly, steatosis, and possible cirrhosis. No evidence
of hepatic neoplasm.

Mild increase in splenomegaly, which may be secondary to portal
venous hypertension.

Minimal ascites.  No evidence of inflammatory process or abscess.

## 2019-12-04 IMAGING — CR DG CHEST 2V
1 series · 2 of 2 positions shown · non-contrast
Comparison: Chest radiograph February 13, 2013 and chest CT October 14, 2016

CLINICAL DATA: Shortness of breath and wheezing

EXAM:
CHEST - 2 VIEW

[Series 1: dg chest 2 view · 0.14mm/px · 2 of 2 slices shown]
[im 1/2]
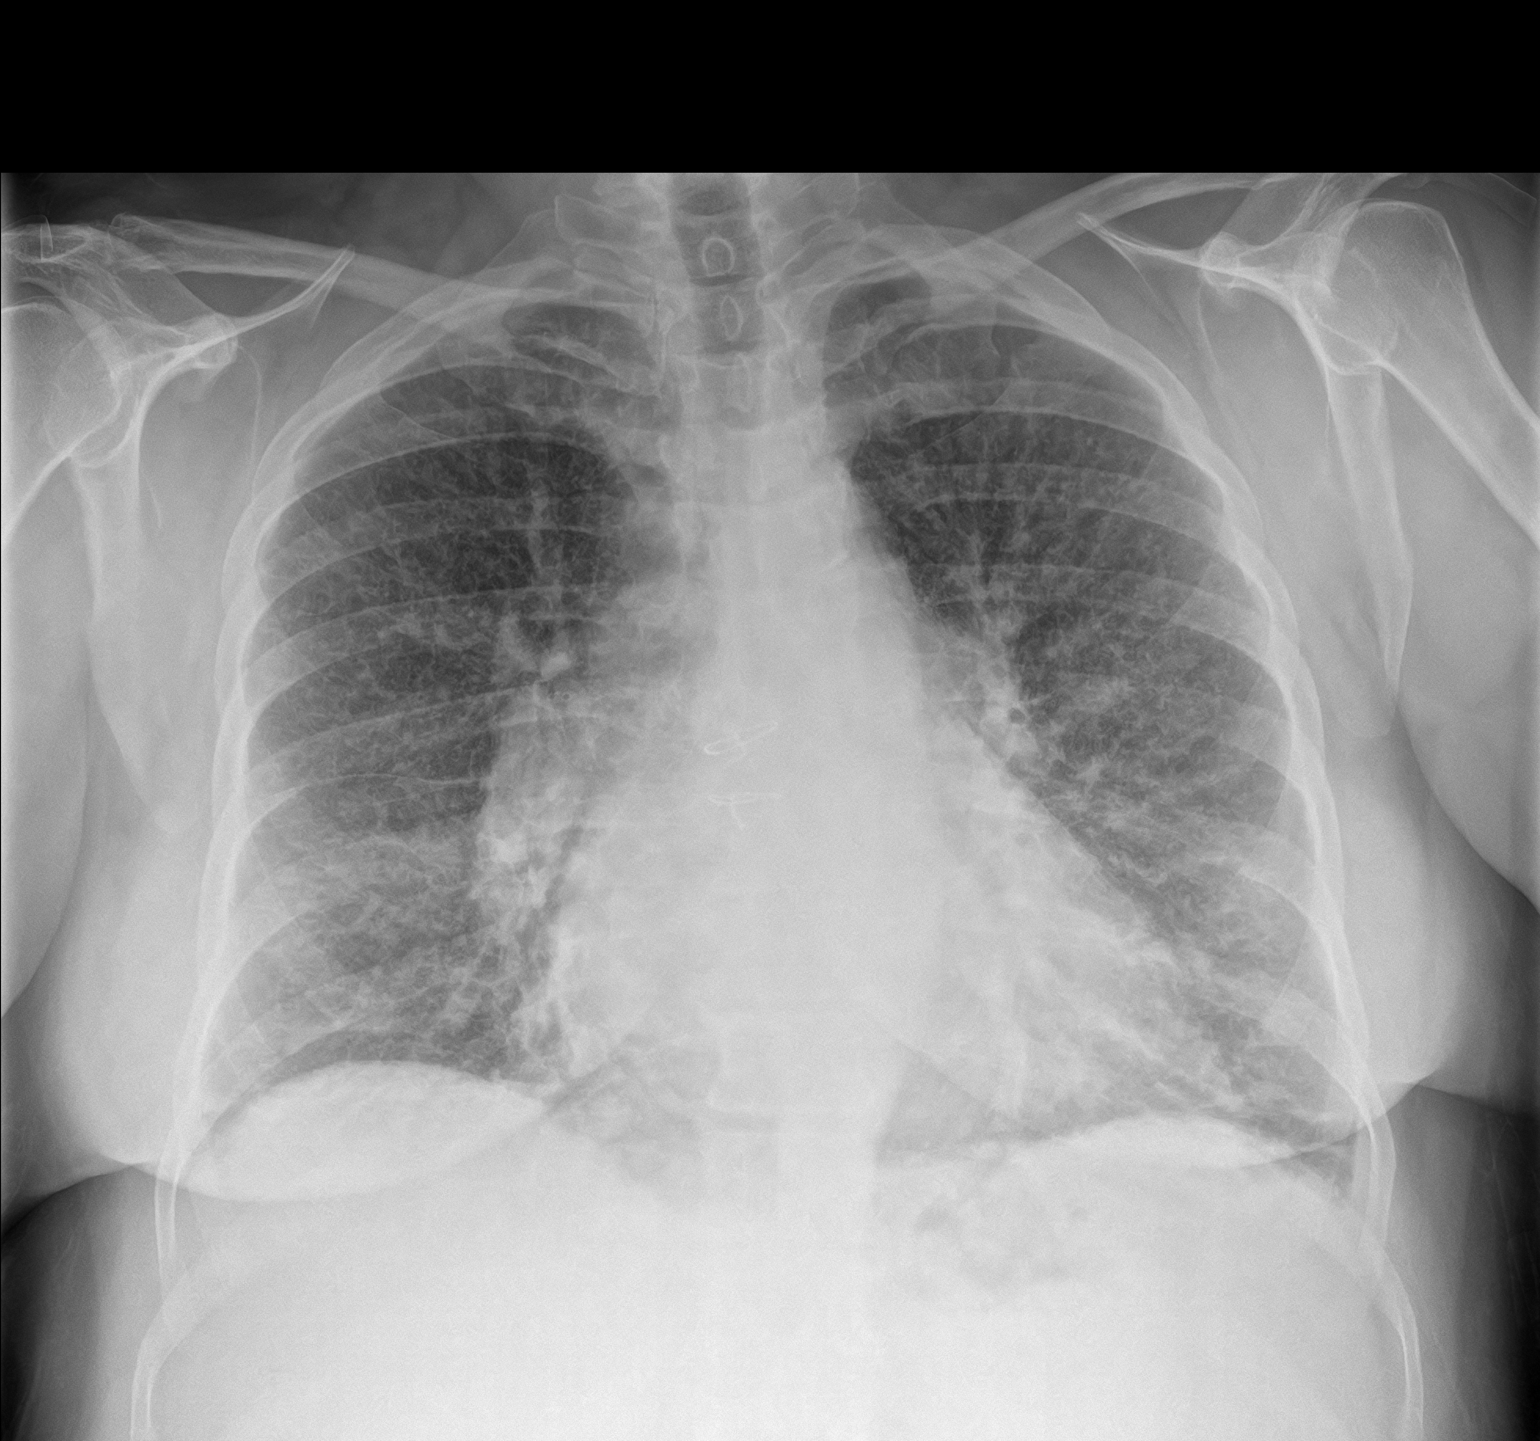
[im 2/2]
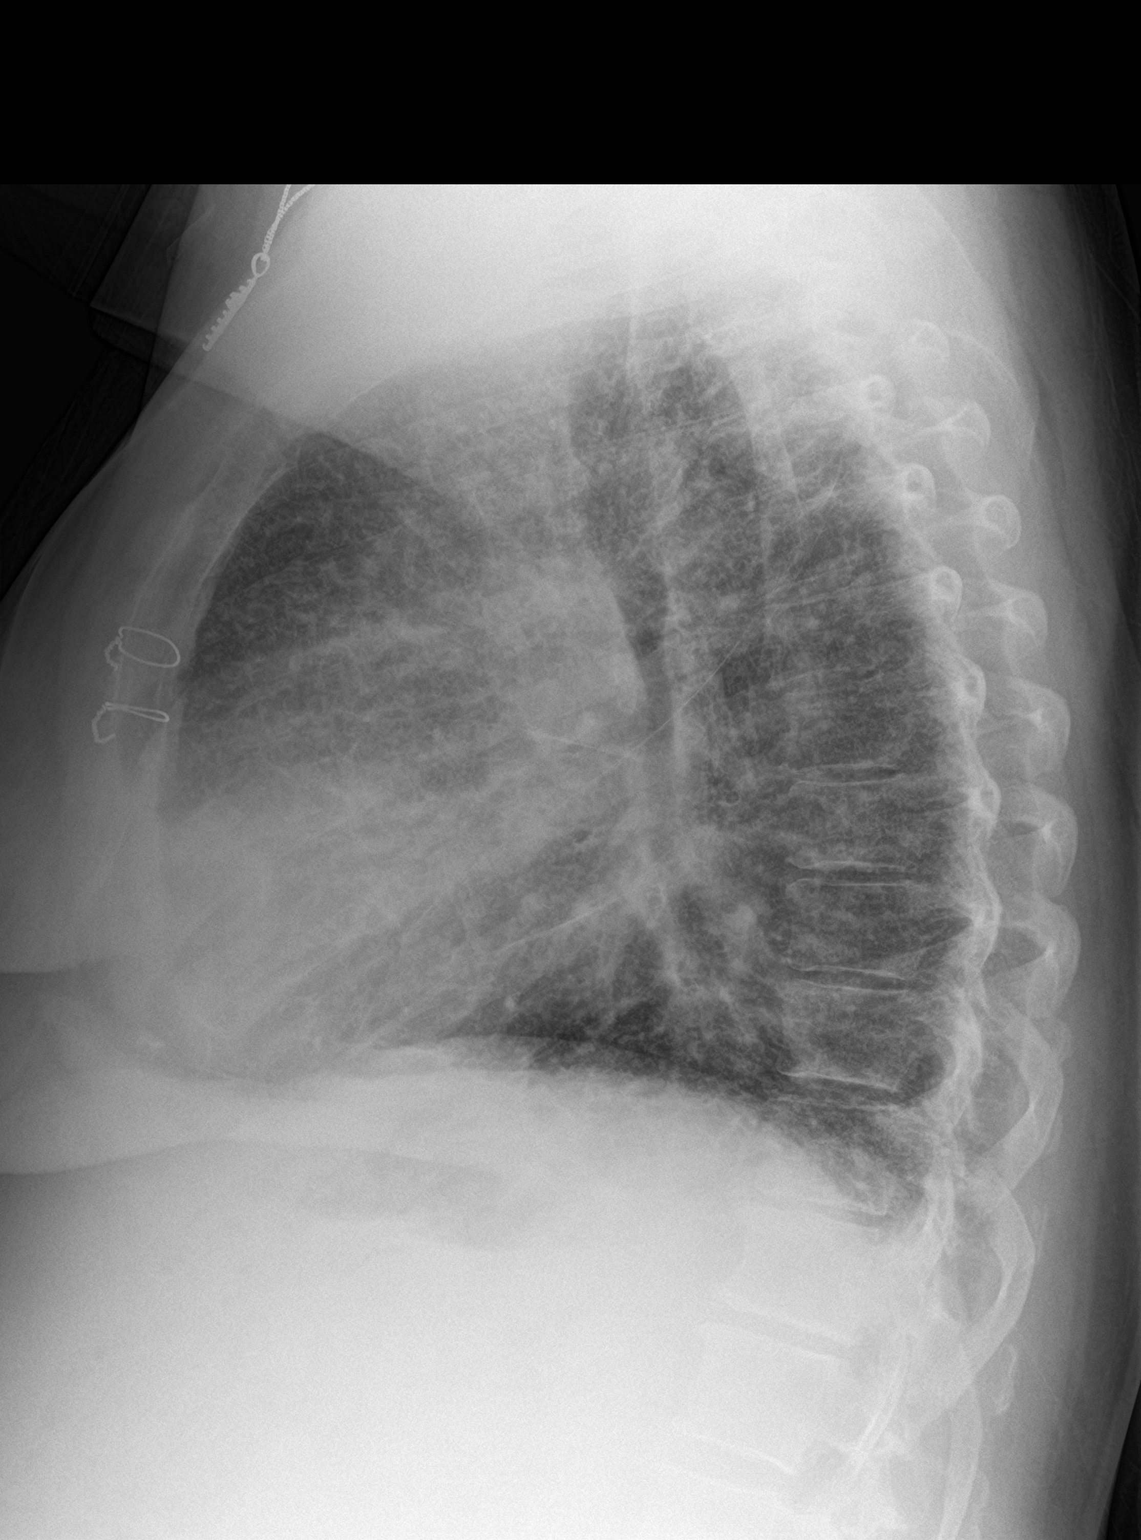

[2 of 2 positions shown; findings below may reference images not displayed]

FINDINGS: There is fibrotic type change throughout the lungs bilaterally with
fine interstitial prominence diffusely. There is atelectatic change
in the bases. There is no frank airspace consolidation. Heart is
borderline enlarged with pulmonary vascularity normal. Patient is
status post median sternotomy. There is aortic atherosclerosis. No
adenopathy. No bone lesions.
IMPRESSION: Diffuse interstitial fibrosis with bibasilar atelectasis. No frank
consolidation or edema.

Heart mildly enlarged with pulmonary vascularity normal. Status post
median sternotomy. Aortic atherosclerosis noted.

Aortic Atherosclerosis ([id number]).

## 2020-01-07 ENCOUNTER — Other Ambulatory Visit: Payer: Self-pay | Admitting: Internal Medicine

## 2020-01-09 ENCOUNTER — Other Ambulatory Visit: Payer: Self-pay | Admitting: Internal Medicine

## 2020-01-09 NOTE — Telephone Encounter (Signed)
Rx request sent to pharmacy.  

## 2020-02-04 DIAGNOSIS — D6869 Other thrombophilia: Secondary | ICD-10-CM | POA: Insufficient documentation

## 2020-02-04 DIAGNOSIS — E1169 Type 2 diabetes mellitus with other specified complication: Secondary | ICD-10-CM | POA: Diagnosis present

## 2020-02-04 DIAGNOSIS — I4819 Other persistent atrial fibrillation: Secondary | ICD-10-CM | POA: Insufficient documentation

## 2020-02-26 IMAGING — US US PELVIS LIMITED
1 series · 14 of 20 positions shown · non-contrast
Comparison: None.

CLINICAL DATA: Bladder is distension, status post Foley catheter
placement.

EXAM:
LIMITED ULTRASOUND OF PELVIS
TECHNIQUE: Limited transabdominal ultrasound examination of the pelvis was
performed.

[Series 1: us pelvis limited · 20 acquisitions, 14 frames shown]
[im 1/20]
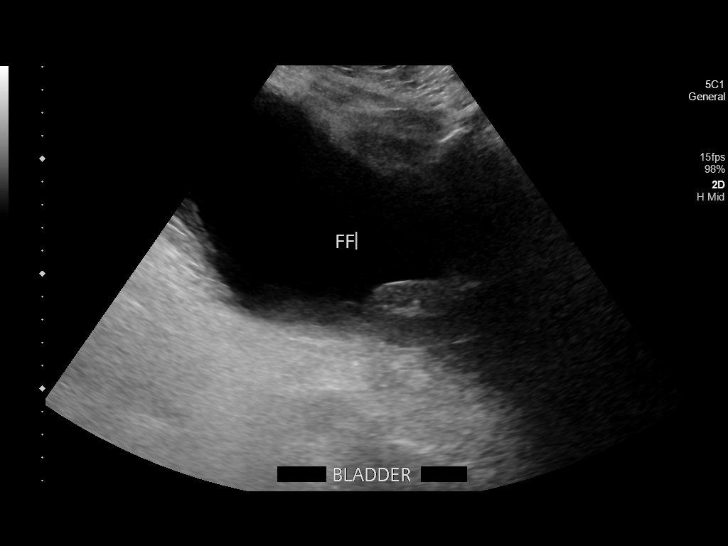
[im 3/20]
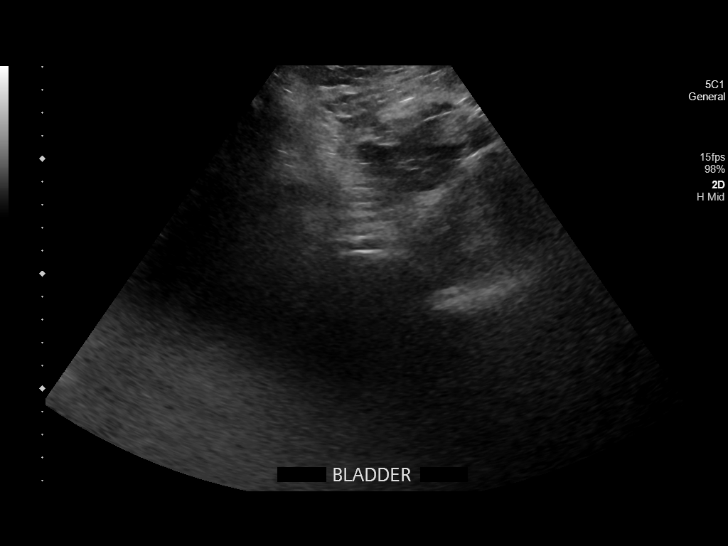
[im 4/20]
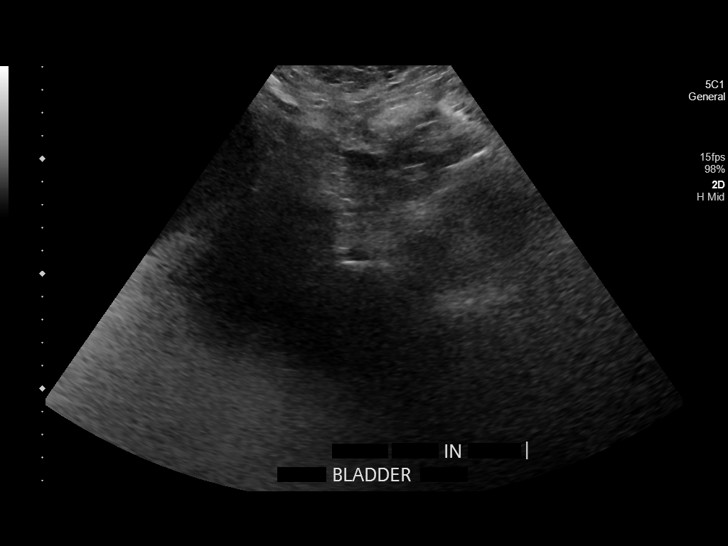
[im 6/20]
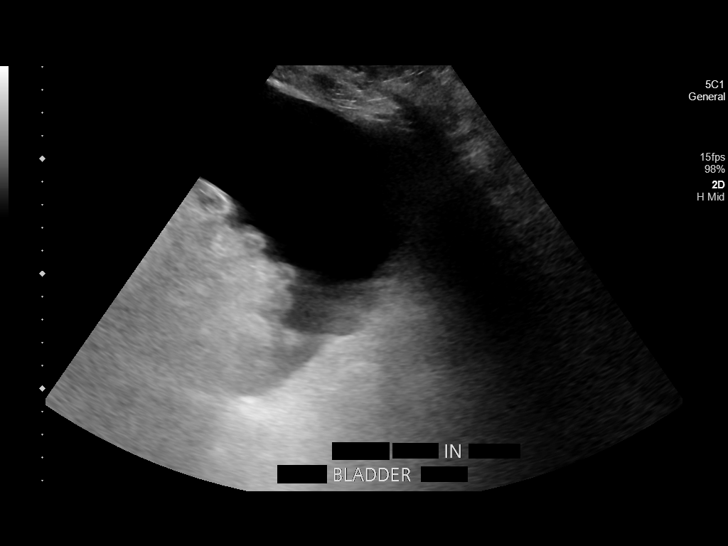
[im 7/20]
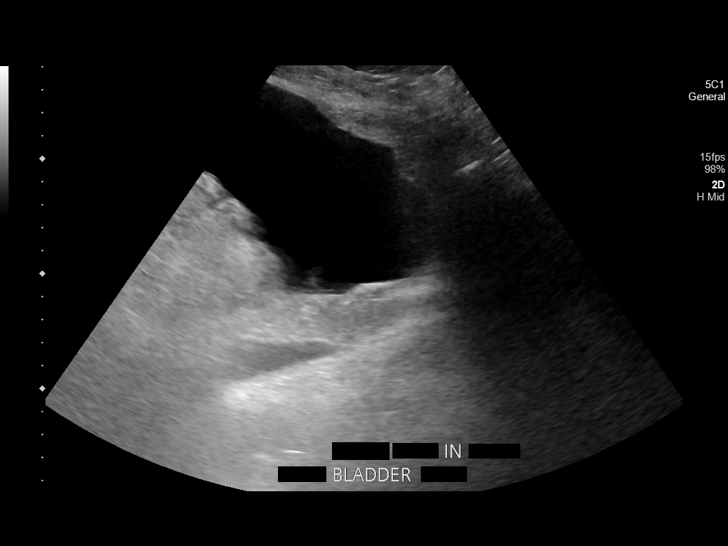
[im 8/20]
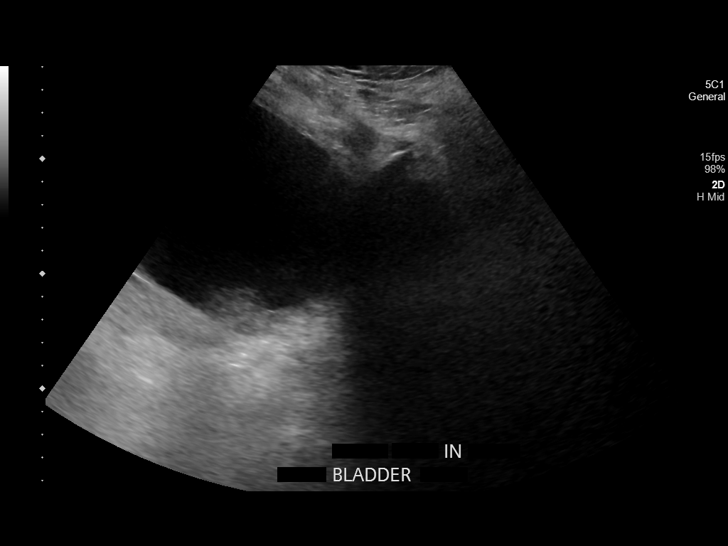
[im 10/20]
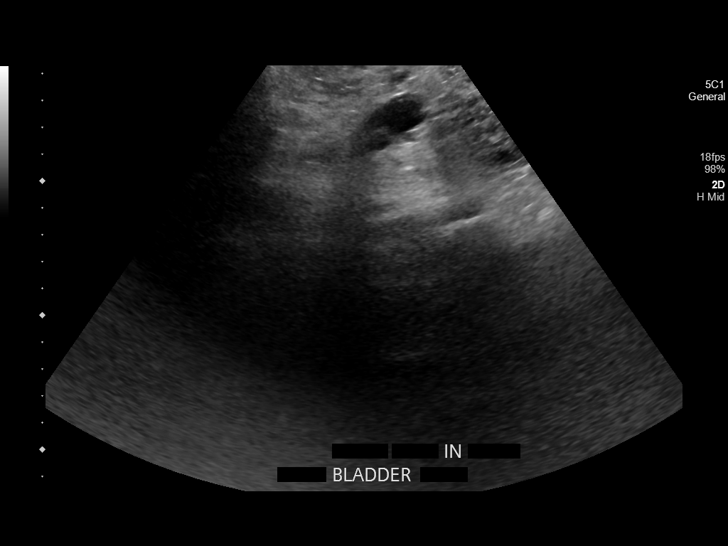
[im 11/20]
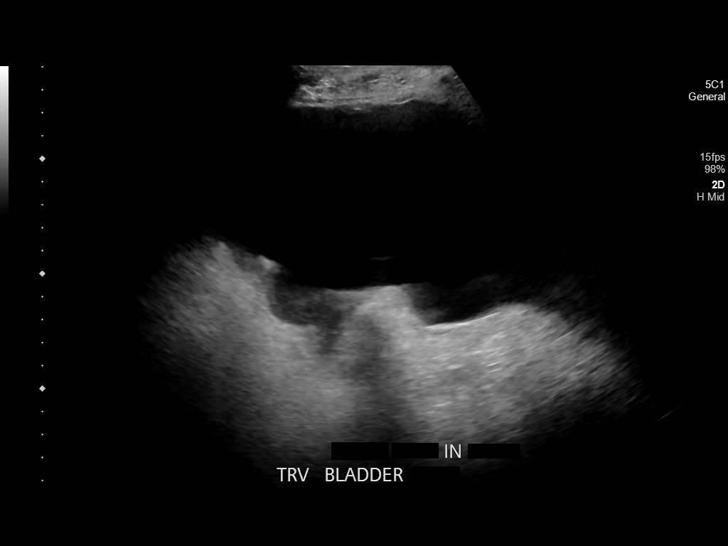
[im 13/20]
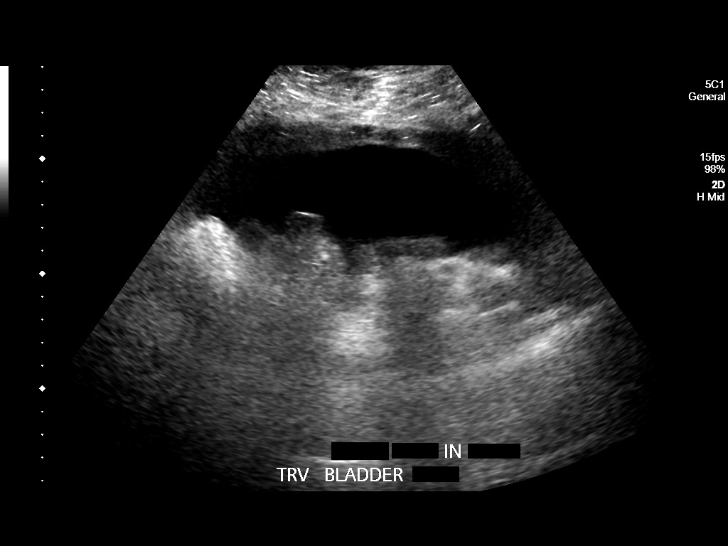
[im 14/20]
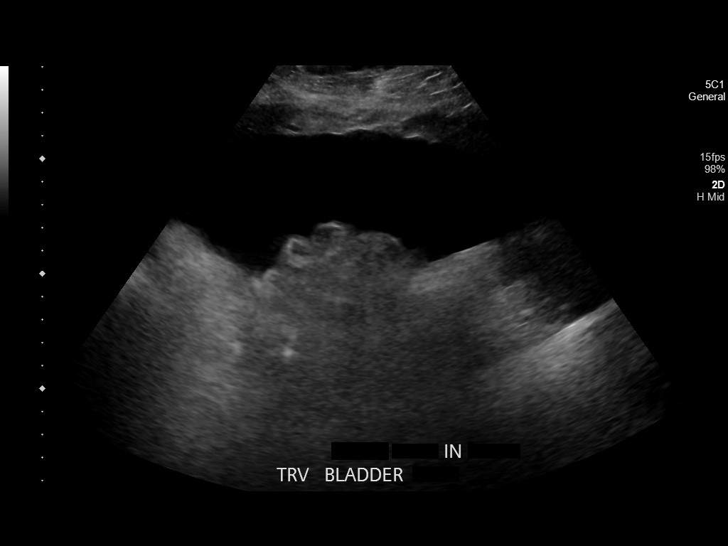
[im 16/20]
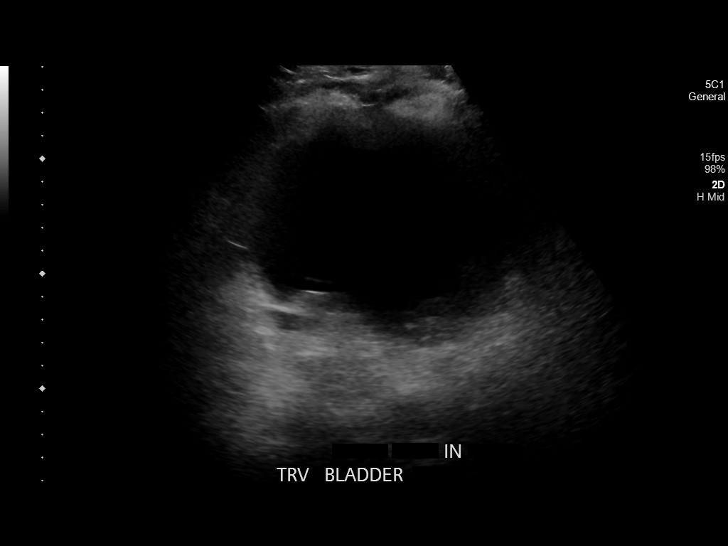
[im 17/20]
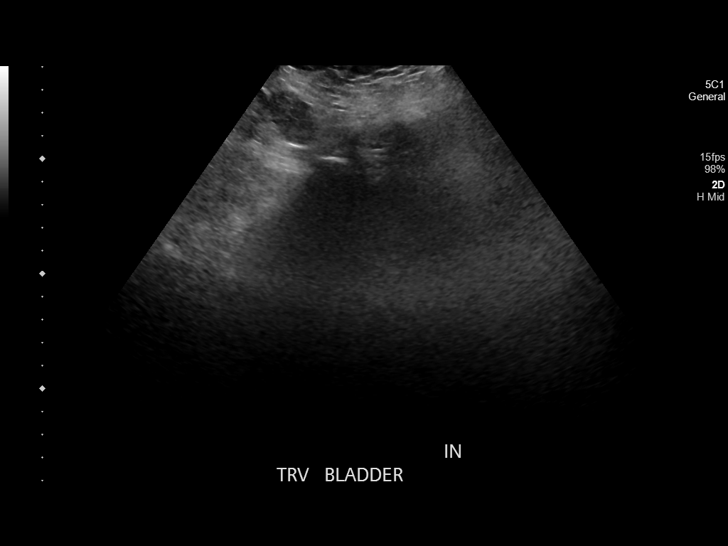
[im 18/20]
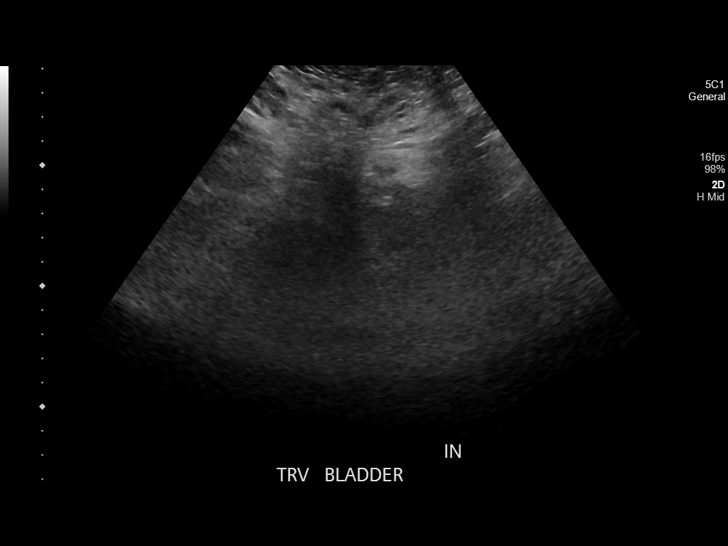
[im 20/20]
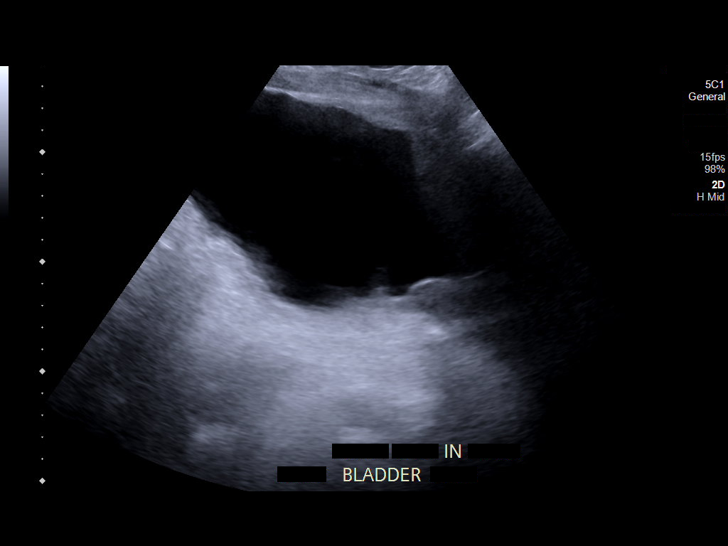

[14 of 20 positions shown; findings below may reference images not displayed]

FINDINGS: The bladder is not visualized and presumably is decompressed.
Ascites is noted in the pelvis. Foley catheter is not clearly
visualized.
IMPRESSION: Bladder is not visualized and is presumably decompressed. Foley
catheter is not clearly visualized either. Ascites is noted in the
pelvis.

## 2020-02-26 IMAGING — DX DG CHEST 1V PORT
2 series · 2 of 2 positions shown · non-contrast
Comparison: 01/03/2018

CLINICAL DATA: Cough

EXAM:
PORTABLE CHEST 1 VIEW

[chest ap (1 of 2)]
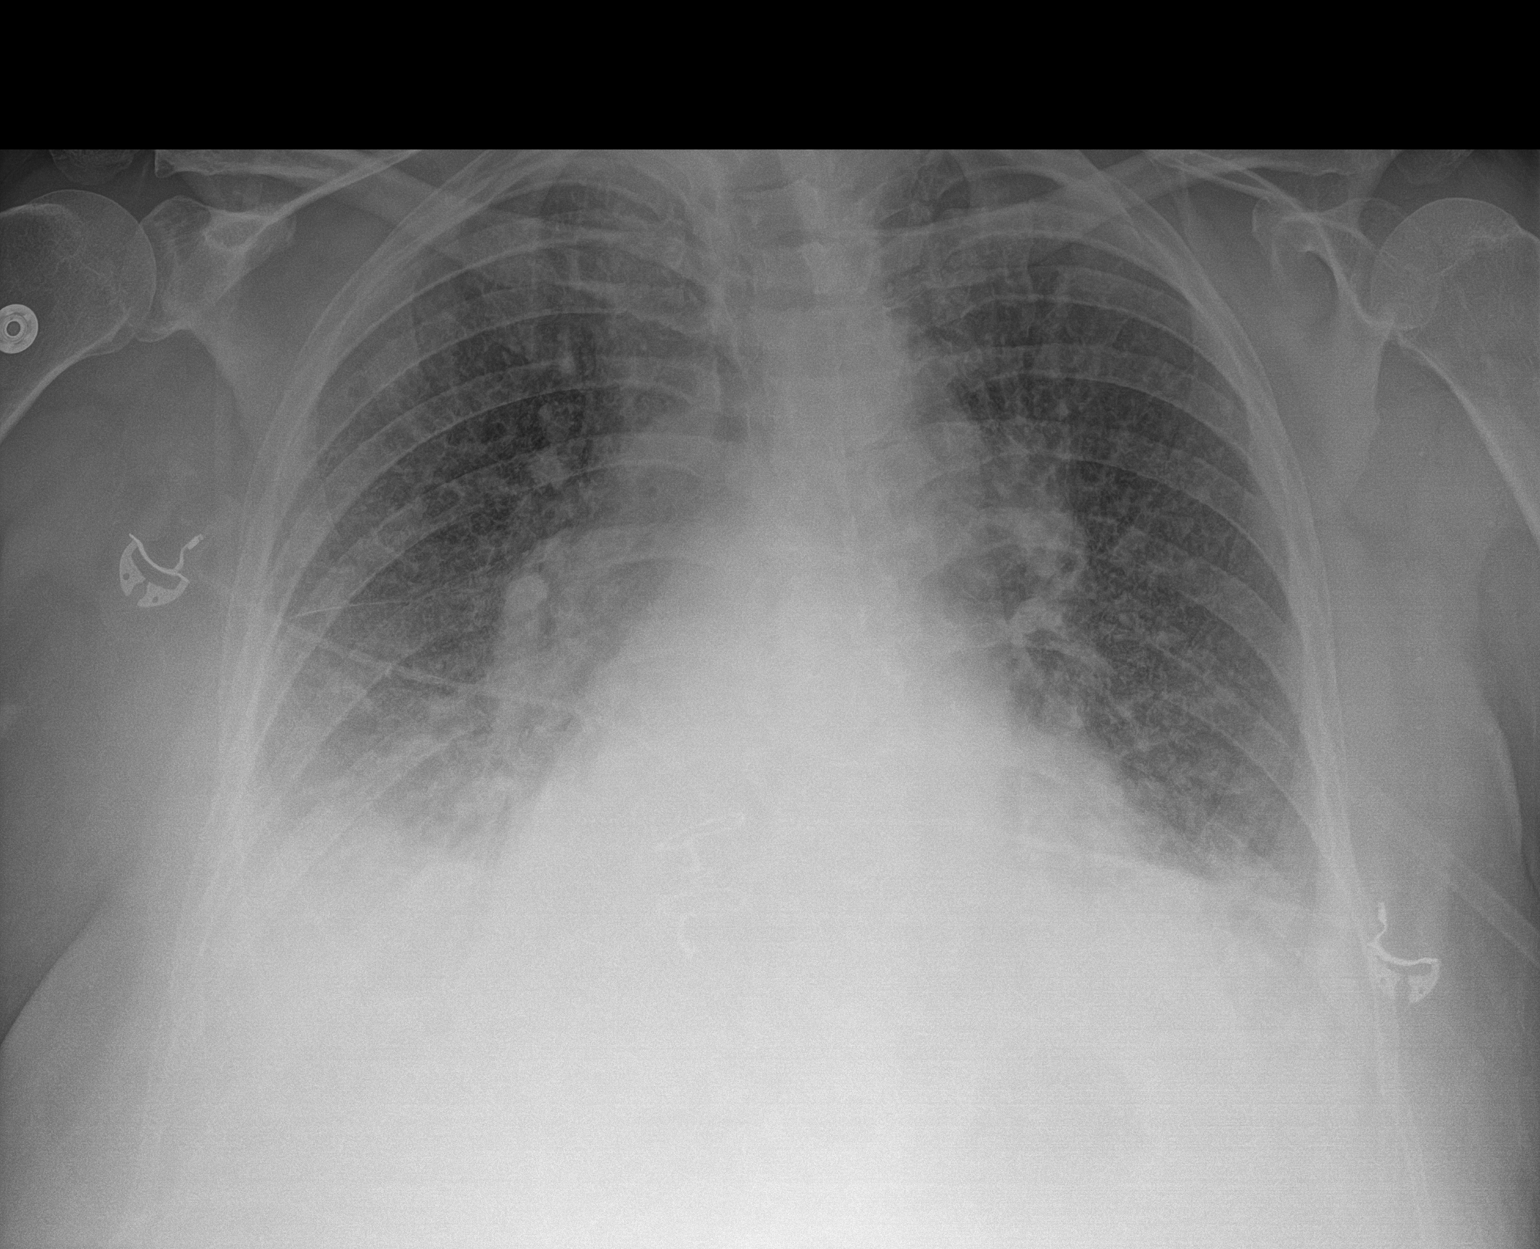

[chest ap (2 of 2)]
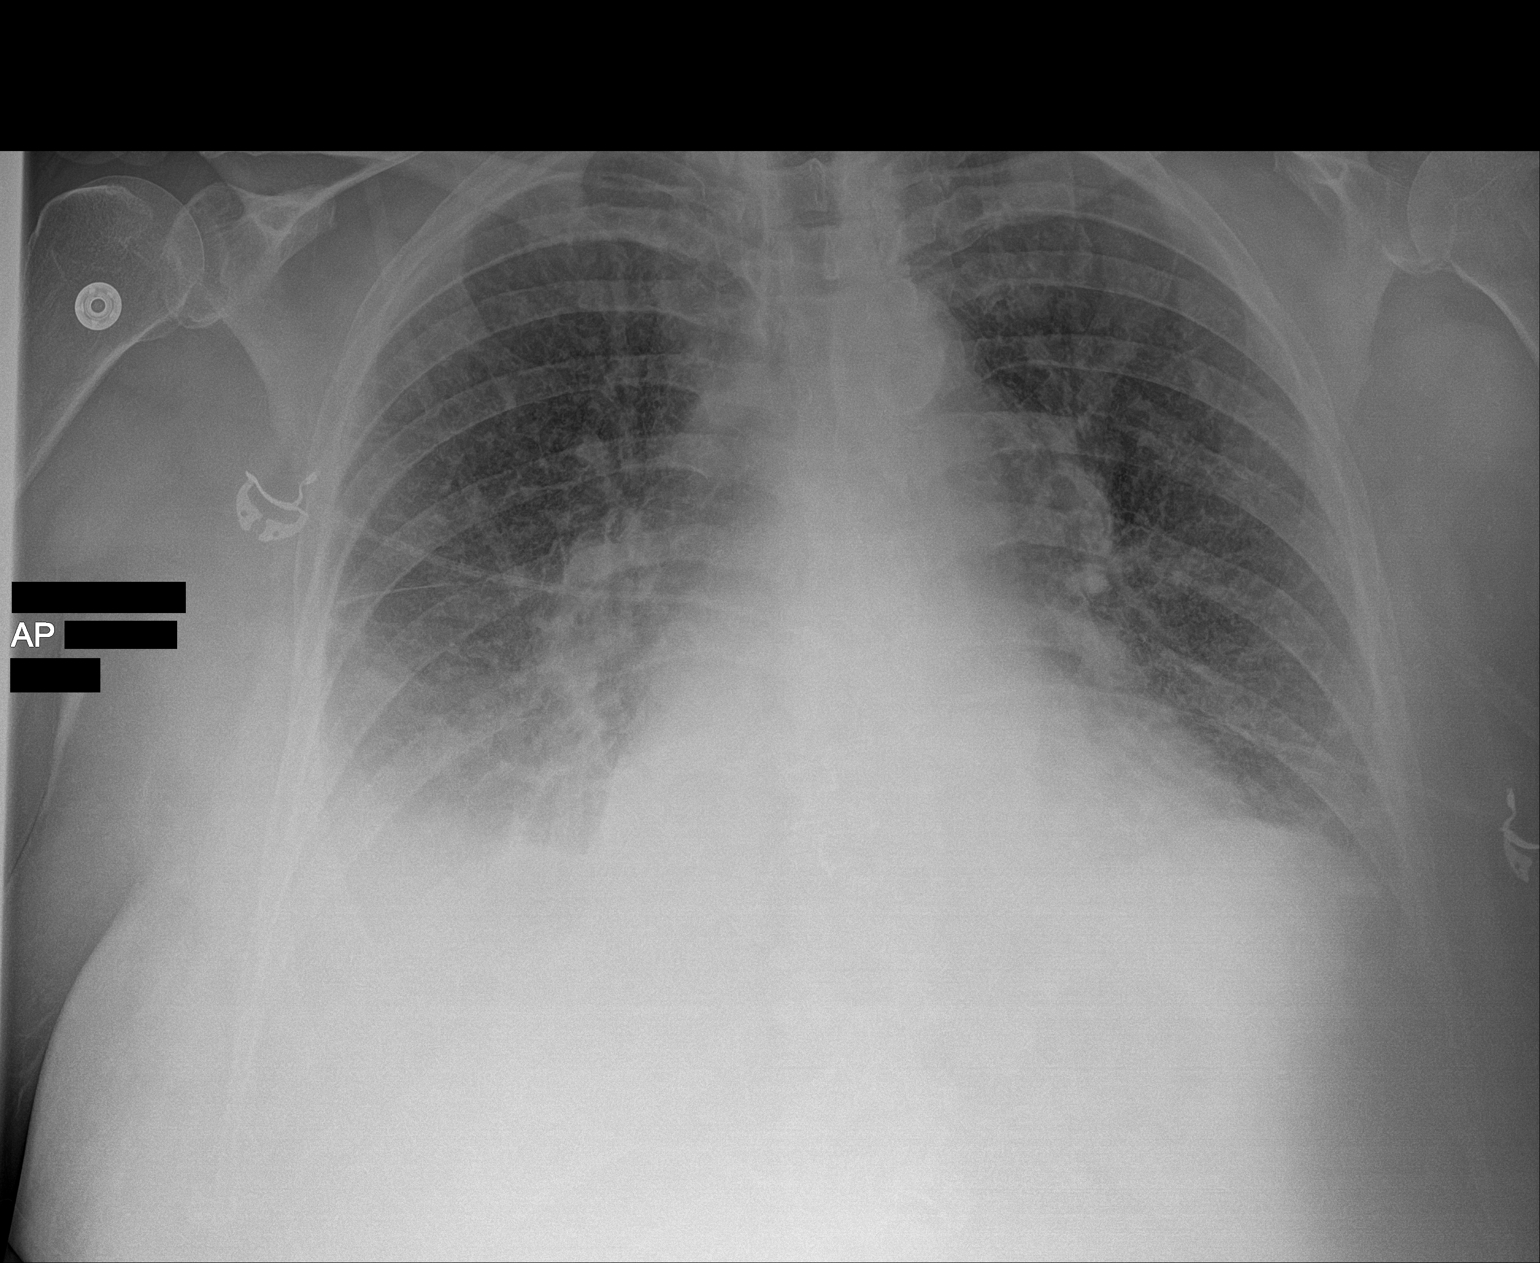

[2 of 2 positions shown; findings below may reference images not displayed]

FINDINGS: Cardiomegaly with vascular congestion. Bilateral layering effusions
and bibasilar atelectasis. Diffuse interstitial prominence, likely
interstitial edema.
IMPRESSION: Cardiomegaly with vascular congestion and interstitial edema.

Layering effusions with bibasilar atelectasis.

## 2020-03-13 ENCOUNTER — Other Ambulatory Visit: Payer: Self-pay | Admitting: Gastroenterology

## 2020-03-13 DIAGNOSIS — K7460 Unspecified cirrhosis of liver: Secondary | ICD-10-CM

## 2020-03-20 ENCOUNTER — Other Ambulatory Visit: Payer: Self-pay

## 2020-03-20 ENCOUNTER — Ambulatory Visit
Admission: RE | Admit: 2020-03-20 | Discharge: 2020-03-20 | Disposition: A | Discharge status: Home / Self Care | Payer: 59 | Source: Ambulatory Visit | Attending: Gastroenterology | Admitting: Gastroenterology

## 2020-03-20 DIAGNOSIS — K7460 Unspecified cirrhosis of liver: Secondary | ICD-10-CM | POA: Insufficient documentation

## 2020-05-05 ENCOUNTER — Other Ambulatory Visit: Payer: Self-pay | Admitting: *Deleted

## 2020-05-05 MED ORDER — ATORVASTATIN CALCIUM 10 MG PO TABS: 10.0000 mg | ORAL_TABLET | Freq: Every day | ORAL | 0 refills | Status: DC

## 2020-05-07 ENCOUNTER — Other Ambulatory Visit: Payer: Self-pay | Admitting: Internal Medicine

## 2020-05-07 ENCOUNTER — Ambulatory Visit (INDEPENDENT_AMBULATORY_CARE_PROVIDER_SITE_OTHER): Payer: 59 | Admitting: Internal Medicine

## 2020-05-07 ENCOUNTER — Other Ambulatory Visit: Payer: Self-pay

## 2020-05-07 ENCOUNTER — Encounter: Payer: Self-pay | Admitting: Internal Medicine

## 2020-05-07 VITALS — BP 120/66 | HR 62 | Ht 66.0 in | Wt 189.0 lb

## 2020-05-07 DIAGNOSIS — I2510 Atherosclerotic heart disease of native coronary artery without angina pectoris: Secondary | ICD-10-CM | POA: Diagnosis not present

## 2020-05-07 DIAGNOSIS — I4819 Other persistent atrial fibrillation: Secondary | ICD-10-CM | POA: Diagnosis not present

## 2020-05-07 DIAGNOSIS — I5022 Chronic systolic (congestive) heart failure: Secondary | ICD-10-CM | POA: Diagnosis not present

## 2020-05-07 DIAGNOSIS — E785 Hyperlipidemia, unspecified: Secondary | ICD-10-CM

## 2020-05-07 DIAGNOSIS — I25119 Atherosclerotic heart disease of native coronary artery with unspecified angina pectoris: Secondary | ICD-10-CM

## 2020-05-07 NOTE — Patient Instructions (Signed)
Medication Instructions:  Your physician recommends that you continue on your current medications as directed. Please refer to the Current Medication list given to you today.  You may stop your PLAVIX after Jun 15, 2020 as this will be 1 year since your procedural intervention.   *If you need a refill on your cardiac medications before your next appointment, please call your pharmacy*   Follow-Up: At [facility], you and your health needs are our priority.  As part of our continuing mission to provide you with exceptional heart care, we have created designated Provider Care Teams.  These Care Teams include your primary Cardiologist (physician) and Advanced Practice Providers (APPs -  Physician Assistants and Nurse Practitioners) who all work together to provide you with the care you need, when you need it.  We recommend signing up for the patient portal called "MyChart".  Sign up information is provided on this After Visit Summary.  MyChart is used to connect with patients for Virtual Visits (Telemedicine).  Patients are able to view lab/test results, encounter notes, upcoming appointments, etc.  Non-urgent messages can be sent to your provider as well.   To learn more about what you can do with MyChart, go to [email or web address].    Your next appointment:   6 month(s)  The format for your next appointment:   In Person  Provider:   You may see Nelva Bush, MD or one of the following Advanced Practice Providers on your designated Care Team:    Murray Hodgkins, NP  Christell Faith, PA-C  Marrianne Mood, PA-C  Cadence [place], [place]  Laurann [place], NP

## 2020-05-07 NOTE — Progress Notes (Unsigned)
Follow-up Outpatient Visit Date: 05/07/2020  Primary Care Provider: Ezequiel Kayser, MD [facility] [place] [address]  Chief Complaint: Follow-up coronary artery disease and HFrEF with recovered ejection fraction  HPI:  Ariana Riggs is a 60 y.o. female with history of coronary artery disease status post recent PCI to the LCx in the setting of unstable angina (04/2990), chronic systolic heart failure with normalization of LVEF, persistent atrial fibrillation, remote ASD repair, cirrhosis, bipolar disorder, obstructive sleep apnea, and polysubstance abuse, who presents for follow-up of CAD and heart failure.  She was last seen in our office in 10/2019 by Ignacia Bayley, NP.  At that time, she reported intermittent right-sided chest discomfort related to stress.  No medication changes or additional testing were pursued.  Today, [name] reports that she is feeling well.  Her only complaint is of easy bruising.  She has not had any chest pain, shortness of breath, palpitations, lightheadedness, or dizziness.  She has not had any frank bleeding, remaining on apixaban and clopidogrel.  --------------------------------------------------------------------------------------------------  Cardiovascular History & Procedures: Cardiovascular Problems:  Coronary artery disease status post PCI to LCx (05/2681)  Systolic heart failure with normalization of LVEF  Paroxysmal atrial fibrillation  ASD s/p repair (~1980)  Risk Factors:  Hyperlipidemia, obesity, and history of polysubstance abuse  Cath/PCI:  LHC/PCI (06/18/2019): Severe single-vessel coronary artery disease with 30% proximal and 90% mid/distal LCx stenoses.  LVEF 55-65%.  Successful PCI to mid/distal LCx with Resolute Onyx 2.75 x 18 mm drug-eluting stent.  CV Surgery:  None  EP Procedures and Devices:  None  Non-Invasive Evaluation(s):  Limited echocardiogram (06/16/2019): Mildly dilated left ventricle  with mild LVH.  LVEF 55-60% with normal wall motion.  Mild left atrial enlargement.  Echocardiogram (06/20/2018): Mildly dilated left ventricle with upper normal wall thickness. LVEF 55-60% with grade 2 diastolic dysfunction and elevated filling pressure. Moderately enlarged right ventricle with normal contraction. Mildly elevated RVSP (35 to 40 mmHg). Moderate left atrial and mild right atrial enlargement. Moderate mitral and tricuspid regurgitation.  Pharmacologic MPI (04/28/2018): Small in size, mild in severity, fixed apical inferior defect most likely representing attenuation artifact. No ischemia. LVEF 55%.  Echocardiogram (03/28/2018): Normal LV size with LVEF of 30-35%. Moderately dilated RV with moderately reduced systolic function. Moderate to severe biatrial enlargement. Moderate to severe atrial regurgitation. Severe tricuspid regurgitation.   Recent CV Pertinent Labs: Lab Results  Component Value Date   CHOL 116 06/16/2019   HDL 32 (L) 06/16/2019   LDLCALC 38 06/16/2019   TRIG 231 (H) 06/16/2019   CHOLHDL 3.6 06/16/2019   INR 1.2 06/15/2019   BNP 137.0 (H) 06/15/2019   K 3.9 06/19/2019   MG 2.2 06/20/2018   BUN 20 06/19/2019   BUN 14 10/13/2018   CREATININE 1.04 (H) 06/19/2019    Past medical and surgical history were reviewed and updated in EPIC.  Current Meds  Medication Sig  . albuterol (PROVENTIL HFA;VENTOLIN HFA) 108 (90 Base) MCG/ACT inhaler Inhale into the lungs every 6 (six) hours as needed for wheezing or shortness of breath.  Marland Kitchen albuterol (PROVENTIL) (2.5 MG/3ML) 0.083% nebulizer solution Inhale 3 mLs into the lungs every 6 (six) hours as needed for wheezing.  Marland Kitchen allopurinol (ZYLOPRIM) 300 MG tablet Take 0.5 tablets (150 mg total) by mouth daily. (Patient taking differently: Take 300 mg by mouth daily.)  . amiodarone (PACERONE) 100 MG tablet TAKE 1 TABLET BY MOUTH EVERY DAY  . apixaban (ELIQUIS) 5 MG TABS tablet Take  1 tablet (5 mg total) by mouth 2 (two)  times daily.  Marland Kitchen atorvastatin (LIPITOR) 10 MG tablet Take 1 tablet (10 mg total) by mouth daily.  . BELSOMRA 20 MG TABS Take 0.5 tablets by mouth at bedtime.  . citalopram (CELEXA) 20 MG tablet Take 20 mg by mouth daily.  . clopidogrel (PLAVIX) 75 MG tablet Take 1 tablet (75 mg total) by mouth daily with breakfast.  . colchicine 0.6 MG tablet Take 0.6 mg by mouth as needed.  . fluticasone (FLONASE) 50 MCG/ACT nasal spray Place 2 sprays into both nostrils 2 (two) times daily.  . furosemide (LASIX) 40 MG tablet Take 1 tablet (40 mg total) by mouth 2 (two) times daily.  . insulin glargine (LANTUS SOLOSTAR) 100 UNIT/ML Solostar Pen Inject into the skin.  Marland Kitchen lansoprazole (PREVACID) 30 MG capsule Take 30 mg by mouth daily at 12 noon.  Marland Kitchen LORazepam (ATIVAN) 1 MG tablet TAKE 1 TABLET BY MOUTH EVERY MORNING AND EVERY EVENING AS DIRECTED  . losartan (COZAAR) 25 MG tablet Take 0.5 tablets (12.5 mg total) by mouth daily.  . metoprolol succinate (TOPROL-XL) 25 MG 24 hr tablet TAKE 1/2 TABLET BY MOUTH EVERY DAY  . multivitamin-iron-minerals-folic acid (CENTRUM) chewable tablet Chew 1 tablet by mouth daily.  . potassium chloride (KLOR-CON) 20 MEQ packet TAKE 20 MEQ BY MOUTH 2 (TWO) TIMES DAILY.  Marland Kitchen spironolactone (ALDACTONE) 25 MG tablet Take 1 tablet (25 mg total) by mouth daily.  . traZODone (DESYREL) 50 MG tablet Take by mouth.  . umeclidinium-vilanterol (ANORO ELLIPTA) 62.5-25 MCG/INH AEPB Inhale 1 puff into the lungs daily.  Marland Kitchen XIFAXAN 550 MG TABS tablet Take 550 mg by mouth 2 (two) times daily.    Allergies: Melatonin, Doxycycline, Paxil [paroxetine hcl], Penicillins, and Sulfa antibiotics  Social History   Tobacco Use  . Smoking status: Former Smoker    Packs/day: 0.25    Years: 40.00    Pack years: 10.00    Types: Cigarettes    Quit date: 03/13/2018    Years since quitting: 2.1  . Smokeless tobacco: Never Used  . Tobacco comment: quit last year  Vaping Use  . Vaping Use: Never used  Substance  Use Topics  . Alcohol use: Not Currently    Alcohol/week: 4.0 standard drinks    Types: 4 Cans of beer per week    Comment: last year  . Drug use: Not Currently    Types: "Crack" cocaine    Comment: quit 11 years ago    Family History  Problem Relation Age of Onset  . Hypertension Mother   . Diabetes Mother   . Peripheral Artery Disease Mother   . Dementia Father   . Hypertension Father   . Peripheral Artery Disease Sister   . Breast cancer Neg Hx     Review of Systems: A 12-system review of systems was performed and was negative except as noted in the HPI.  --------------------------------------------------------------------------------------------------  Physical Exam: BP 120/66 (BP Location: Left Arm, Patient Position: Sitting, Cuff Size: Normal)   Pulse 62   Ht 5\' 6"  (1.676 m)   Wt 189 lb (85.7 kg)   LMP  (LMP Unknown)   SpO2 95%   BMI 30.51 kg/m   General:  NAD. Neck: No JVD or HJR. Lungs: Clear to auscultation bilaterally without wheezes or crackles. Heart: Regular rate and rhythm without murmurs, rubs, or gallops. Abdomen: Soft, nontender, nondistended. Extremities: No lower extremity edema.  EKG: Normal sinus rhythm with incomplete right bundle branch block,  poor R wave progression, and lateral T wave inversions.  Lateral ST/T changes are less pronounced compared with prior tracing on 11/08/2019..  Lab Results  Component Value Date   WBC 7.3 06/19/2019   HGB 14.1 06/19/2019   HCT 38.6 06/19/2019   MCV 88.5 06/19/2019   PLT 165 06/19/2019    Lab Results  Component Value Date   NA 139 06/19/2019   K 3.9 06/19/2019   CL 103 06/19/2019   CO2 27 06/19/2019   BUN 20 06/19/2019   CREATININE 1.04 (H) 06/19/2019   GLUCOSE 124 (H) 06/19/2019   ALT 29 06/20/2018    Lab Results  Component Value Date   CHOL 116 06/16/2019   HDL 32 (L) 06/16/2019   LDLCALC 38 06/16/2019   TRIG 231 (H) 06/16/2019   CHOLHDL 3.6 06/16/2019     --------------------------------------------------------------------------------------------------  ASSESSMENT AND PLAN: Coronary artery disease: Ms. Cobaugh is doing well without recurrent angina.  She is approaching her 1 year anniversary from unstable angina with PCI to the proximal LCx.  We have agreed to continue apixaban and clopidogrel through early May, after which time clopidogrel can be discontinued.  Continue secondary prevention including atorvastatin 10 mg daily (LDL well controlled on last check in 01/2020 through [place] at 38).  Chronic HFrEF: Ms. Mullinax appears euvolemic on exam today.  Most recent echo a year ago showed recovered LVEF of 55-60%.  We will continue current regimen of furosemide 40 mg twice daily, metoprolol succinate 12.5 mg daily, losartan 12.5 mg daily, and spironolactone 25 mg daily.  CMP in late January through [place] showed normal renal function and electrolytes.  Persistent atrial fibrillation: Ms. Frank is maintaining sinus rhythm.  As previously recommended by EP, will continue with amiodarone 100 mg daily as well as indefinite anticoagulation with apixaban 5 mg twice daily.  Other than chronically elevated alkaline phosphatase, LFTs were unremarkable in 02/2020.  TSH in 01/2020 was normal.  Hyperlipidemia: LDL well controlled.  Continue low-dose atorvastatin.  Follow-up: Return to clinic in 6 months.  Nelva Bush, MD 05/07/2020 2:33 PM

## 2020-05-08 ENCOUNTER — Encounter: Payer: Self-pay | Admitting: Internal Medicine

## 2020-05-08 DIAGNOSIS — I2510 Atherosclerotic heart disease of native coronary artery without angina pectoris: Secondary | ICD-10-CM | POA: Insufficient documentation

## 2020-05-08 NOTE — Telephone Encounter (Signed)
Rx request sent to pharmacy.  

## 2020-06-09 ENCOUNTER — Other Ambulatory Visit: Payer: Self-pay | Admitting: Gastroenterology

## 2020-06-09 DIAGNOSIS — N281 Cyst of kidney, acquired: Secondary | ICD-10-CM

## 2020-06-09 DIAGNOSIS — K7030 Alcoholic cirrhosis of liver without ascites: Secondary | ICD-10-CM

## 2020-06-11 ENCOUNTER — Telehealth: Payer: Self-pay | Admitting: Internal Medicine

## 2020-06-11 NOTE — Telephone Encounter (Signed)
Patient is on Eliquis for persistent atrial fibrillation.  Procedure is in 92-month, will need to contact the patient either in late May or early June when is closer to the date of surgery.

## 2020-06-11 NOTE — Telephone Encounter (Signed)
   [facility] Pre-operative Risk Assessment    Patient Name: Ariana Riggs  DOB: 23-Jun-1960  MRN: [id number]   HEARTCARE STAFF: - Please ensure there is not already an duplicate clearance open for this procedure. - Under Visit Info/Reason for Call, type in Other and utilize the format Clearance MM/DD/YY or Clearance TBD. Do not use dashes or single digits. - If request is for dental extraction, please clarify the # of teeth to be extracted.  Request for surgical clearance:  1. What type of surgery is being performed? EGD   2. When is this surgery scheduled? 0902/22  3. What type of clearance is required (medical clearance vs. Pharmacy clearance to hold med vs. Both)? both  4. Are there any medications that need to be held prior to surgery and how long? Plavix 75 MG and Eliquis 5 MG instructions   5. Practice name and name of physician performing surgery? [facility] - Dr Andrey Farmer   6. What is the office phone number? [phone]   7.   What is the office fax number? [phone]  8.   Anesthesia type (None, local, MAC, general) ? Yes, does not indicate what type   Ariana Riggs 06/11/2020, 2:42 PM  _________________________________________________________________   (provider comments below)

## 2020-06-11 NOTE — Telephone Encounter (Signed)
Patient with diagnosis of afib on Eliquis for anticoagulation.    Procedure: EGD Date of procedure: 10/17/20  CHA2DS2-VASc Score = 3  This indicates a 3.2% annual risk of stroke. The patient's score is based upon: CHF History: Yes HTN History: No Diabetes History: No Stroke History: No Vascular Disease History: Yes Age Score: 0 Gender Score: 1   CrCl 37mL/min using adjusted body weight due to obesity Platelet count 144K  Per office protocol, patient can hold Eliquis for 1-2 days prior to procedure assuming no relevant clinical changes between now and Sept procedure date.

## 2020-06-18 ENCOUNTER — Other Ambulatory Visit: Payer: Self-pay

## 2020-06-18 ENCOUNTER — Ambulatory Visit
Admission: RE | Admit: 2020-06-18 | Discharge: 2020-06-18 | Disposition: A | Discharge status: Home / Self Care | Payer: 59 | Source: Ambulatory Visit | Attending: Gastroenterology | Admitting: Gastroenterology

## 2020-06-18 DIAGNOSIS — K7030 Alcoholic cirrhosis of liver without ascites: Secondary | ICD-10-CM | POA: Insufficient documentation

## 2020-06-18 DIAGNOSIS — N281 Cyst of kidney, acquired: Secondary | ICD-10-CM | POA: Insufficient documentation

## 2020-06-18 MED ORDER — GADOBUTROL 1 MMOL/ML IV SOLN
7.5000 mL | Freq: Once | INTRAVENOUS | Status: AC | PRN
  Administered 2020-06-18: 7.5 mL via INTRAVENOUS

## 2020-06-20 ENCOUNTER — Telehealth: Payer: Self-pay | Admitting: Internal Medicine

## 2020-06-20 MED ORDER — LOSARTAN POTASSIUM 25 MG PO TABS: 12.5000 mg | ORAL_TABLET | Freq: Every day | ORAL | 1 refills | Status: DC

## 2020-06-20 MED ORDER — METOPROLOL SUCCINATE ER 25 MG PO TB24: 0.5000 | ORAL_TABLET | Freq: Every day | ORAL | 1 refills | Status: DC

## 2020-06-20 NOTE — Telephone Encounter (Signed)
Received fax from [facility] requesting refills for Metoprolol Succinate and Losartan. Rx request sent to pharmacy.

## 2020-08-02 ENCOUNTER — Other Ambulatory Visit: Payer: Self-pay | Admitting: Internal Medicine

## 2020-08-15 NOTE — Telephone Encounter (Signed)
Left voicemail 12:55 PM on 08/15/2020.  Patient needs call back.

## 2020-08-20 DIAGNOSIS — Z8616 Personal history of COVID-19: Secondary | ICD-10-CM

## 2020-08-20 HISTORY — DX: Personal history of COVID-19: Z86.16

## 2020-08-20 NOTE — Telephone Encounter (Addendum)
   Name: Ariana Riggs  DOB: June 12, 1960  MRN: [id number]   Primary Cardiologist: Nelva Bush, MD  Chart reviewed as part of pre-operative protocol coverage. Patient was contacted 08/20/2020 in reference to pre-operative risk assessment for pending surgery as outlined below.  Ariana Riggs was last seen on 04/2020 by Dr. Saunders Revel with complex history to include coronary artery disease status post recent PCI to the LCx in the setting of unstable angina (05/4008), chronic systolic heart failure with normalization of LVEF, persistent atrial fibrillation, remote ASD repair, cirrhosis, bipolar disorder, obstructive sleep apnea, and polysubstance abuse. At that [place] she was felt to be doing well. The plan was to discontinue Plavix after Jun 15, 2020 and continue Eliquis. Tried to call pt but goes right to VM. Will need to clarify anticoag/antiplatelet rx. LMTCB (2nd try by chart).  Charlie Pitter, PA-C 08/20/2020, 10:28 AM

## 2020-08-22 ENCOUNTER — Telehealth: Payer: Self-pay | Admitting: Internal Medicine

## 2020-08-22 NOTE — Telephone Encounter (Signed)
   Patient Name: Ariana Riggs  DOB: 1960-07-31 MRN: [id number]  Primary Cardiologist: Nelva Bush, MD  Chart revisited as part of pre-operative protocol coverage.  Patient returned phone call. She states she is under the weather as she tested positive for Covid this morning and has been in touch with her PCP about next steps for medication.  She indicates she actually plans to cancel the requested provider for September, that she is not interested in having an EGD at this time. Therefore we will remove this preop request from our box but patient aware to reach out for any further needs. Will route this update to requesting provider via Epic fax function. Please call with questions.    Charlie Pitter, PA-C 08/22/2020, 11:05 AM

## 2020-08-22 NOTE — Telephone Encounter (Signed)
   Pt c/o medication issue:  1. Name of Medication: amiodarone and paxlovid  2. How are you currently taking this medication (dosage and times per day)? Amiodarone 100 mg po q d   3. Are you having a reaction (difficulty breathing--STAT)? No   4. What is your medication issue? PCP told patient to call and ask if she can continue to take amio while taking paxlovid or to hold while on it

## 2020-08-22 NOTE — Telephone Encounter (Signed)
Spoke with patient and reviewed Dr. Darnelle Bos recommendations to hold paxlovid until she finishes her Paxlovid with instructions to restart when finished. She verbalized understanding of these instructions with no further questions at this time.

## 2020-08-22 NOTE — Telephone Encounter (Signed)
Ok to hold amiodarone until she has completed Paxlovid therapy.  Nelva Bush, MD [facility]

## 2020-08-22 NOTE — Telephone Encounter (Signed)
LMTCB again. Will also send Mychart message. If she does not return call, will need to close out clearance and let surgeon know we were unable to reach patient.

## 2020-09-02 ENCOUNTER — Other Ambulatory Visit: Payer: Self-pay | Admitting: Internal Medicine

## 2020-09-20 ENCOUNTER — Other Ambulatory Visit: Payer: Self-pay | Admitting: Internal Medicine

## 2020-10-17 ENCOUNTER — Ambulatory Visit: Admit: 2020-10-17 | Payer: 59

## 2020-10-17 SURGERY — ESOPHAGOGASTRODUODENOSCOPY (EGD) WITH PROPOFOL
Anesthesia: General

## 2020-10-21 ENCOUNTER — Other Ambulatory Visit: Payer: Self-pay | Admitting: Internal Medicine

## 2020-11-12 ENCOUNTER — Ambulatory Visit: Payer: 59 | Admitting: Internal Medicine

## 2020-11-26 ENCOUNTER — Telehealth: Payer: Self-pay | Admitting: *Deleted

## 2020-11-26 ENCOUNTER — Other Ambulatory Visit: Payer: Self-pay

## 2020-11-26 ENCOUNTER — Encounter: Payer: Self-pay | Admitting: Internal Medicine

## 2020-11-26 ENCOUNTER — Ambulatory Visit (INDEPENDENT_AMBULATORY_CARE_PROVIDER_SITE_OTHER): Payer: 59 | Admitting: Internal Medicine

## 2020-11-26 ENCOUNTER — Other Ambulatory Visit
Admission: RE | Admit: 2020-11-26 | Discharge: 2020-11-26 | Disposition: A | Discharge status: Home / Self Care | Payer: 59 | Attending: Internal Medicine | Admitting: Internal Medicine

## 2020-11-26 VITALS — BP 120/62 | HR 68 | Ht 66.0 in | Wt 168.0 lb

## 2020-11-26 DIAGNOSIS — I200 Unstable angina: Secondary | ICD-10-CM | POA: Diagnosis not present

## 2020-11-26 DIAGNOSIS — I2510 Atherosclerotic heart disease of native coronary artery without angina pectoris: Secondary | ICD-10-CM | POA: Diagnosis present

## 2020-11-26 DIAGNOSIS — I5022 Chronic systolic (congestive) heart failure: Secondary | ICD-10-CM | POA: Insufficient documentation

## 2020-11-26 DIAGNOSIS — E785 Hyperlipidemia, unspecified: Secondary | ICD-10-CM

## 2020-11-26 DIAGNOSIS — I480 Paroxysmal atrial fibrillation: Secondary | ICD-10-CM | POA: Insufficient documentation

## 2020-11-26 LAB — COMPREHENSIVE METABOLIC PANEL
ALT: 14 U/L (ref 0–44)
AST: 21 U/L (ref 15–41)
Albumin: 4.8 g/dL (ref 3.5–5.0)
Alkaline Phosphatase: 108 U/L (ref 38–126)
Anion gap: 10 (ref 5–15)
BUN: 20 mg/dL (ref 8–23)
CO2: 27 mmol/L (ref 22–32)
Calcium: 9.5 mg/dL (ref 8.9–10.3)
Chloride: 98 mmol/L (ref 98–111)
Creatinine, Ser: 0.82 mg/dL (ref 0.44–1.00)
GFR, Estimated: >60 mL/min (ref >60)
Glucose, Bld: 90 mg/dL (ref 70–99)
Potassium: 3.6 mmol/L (ref 3.5–5.1)
Sodium: 135 mmol/L (ref 135–145)
Total Bilirubin: 1.2 mg/dL (ref 0.3–1.2)
Total Protein: 7.9 g/dL (ref 6.5–8.1)

## 2020-11-26 LAB — CBC
HCT: 42.3 % (ref 36.0–46.0)
Hemoglobin: 15.4 g/dL — ABNORMAL HIGH (ref 12.0–15.0)
MCH: 32.8 pg (ref 26.0–34.0)
MCHC: 36.4 g/dL — ABNORMAL HIGH (ref 30.0–36.0)
MCV: 90 fL (ref 80.0–100.0)
Platelets: 155 10*3/uL (ref 150–400)
RBC: 4.7 MIL/uL (ref 3.87–5.11)
RDW: 13.7 % (ref 11.5–15.5)
WBC: 7.4 10*3/uL (ref 4.0–10.5)
nRBC: 0 % (ref 0.0–0.2)

## 2020-11-26 MED ORDER — METOPROLOL SUCCINATE ER 25 MG PO TB24: 25.0000 mg | ORAL_TABLET | Freq: Every day | ORAL | 3 refills | Status: DC

## 2020-11-26 MED ORDER — NITROGLYCERIN 0.4 MG SL SUBL: 0.4000 mg | SUBLINGUAL_TABLET | SUBLINGUAL | 3 refills | Status: DC | PRN

## 2020-11-26 MED ORDER — ASPIRIN EC 81 MG PO TBEC: 81.0000 mg | DELAYED_RELEASE_TABLET | Freq: Every day | ORAL | Status: DC

## 2020-11-26 NOTE — Telephone Encounter (Signed)
Pt in office 11/26/20 to see Dr. Saunders Revel.  Pt to be scheduled for left heart catheterization for accelerating angina Friday 11/28/20 at [facility] with Dr. Saunders Revel.  Instructions reviewed with pt in office. See AVS for further details.  Cath lab scheduling not available at time of call at 4:30 PM.  Message left with cath scheduling to set up pt for 11:30 AM 11/28/20 with Dr. Saunders Revel.   Pt was given instructions to arrive at 10:30 AM and was told that if this time changes that our office will call her.  This RN will not be in the office tomorrow. Message routed to triage to follow up Thursday 10/13 with cath scheduling that pt's procedure was set for 11:30 with Dr. Saunders Revel.

## 2020-11-26 NOTE — Patient Instructions (Signed)
Medication Instructions:   Your physician has recommended you make the following change in your medication:   HOLD Eliquis beginning today - you will be instructed after your procedure when to restart  START Aspirin 81 mg daily   INCREASE Metoprolol Succinate 25 mg daily - You may take one whole tablet of current dose until pick up Rx  START Sublingual Nitroglycerin 0.4 mg (see below)  For as needed Nitroglycerin, if you develop chest pain: Sit and rest 5 minutes. If chest pain does not resolve place 1 nitroglycerin under your tongue and wait 5 minutes. If chest pain does not resolve, place a 2nd nitroglycerin under your tongue and wait 5 more minutes. If chest pain does not resolve, place a 3rd nitroglycerin under your tongue and seek emergency services.    *If you need a refill on your cardiac medications before your next appointment, please call your pharmacy*   Lab Work:  Today: CMET, CBC  -  Please go to the [place] will check in at the front desk to the right as you walk into the atrium.    If you have labs (blood work) drawn today and your tests are completely normal, you will receive your results only by: [facility] (if you have MyChart) OR A paper copy in the mail If you have any lab test that is abnormal or we need to change your treatment, we will call you to review the results.   Testing/Procedures:  You are scheduled for a Cardiac Catheterization on Friday, October 14 with Dr. Harrell Gave End.  1. Please arrive at the [place] at [facility] at 10:30 AM (This time is one hour before your procedure to ensure your preparation). Free valet parking service is available.   Special note: Every effort is made to have your procedure done on time. Please understand that emergencies sometimes delay scheduled procedures.  2. Diet: Do not eat solid foods after midnight.  The patient may have clear liquids until 5am upon the day of the procedure.  3.  Labs: Pre procedure labs completed today.   4. Medication instructions in preparation for your procedure:  - Do Not take Eliquis STARTING TODAY. You will be instructed when to restart after your procedure.   - Take only 1/2 dose of insulin dose the night before your procedure.  Do Not take any insulin the day of procedure.  - Do Not take your Furosemide/Lasix day of procedure  - On the morning of your procedure, take your Aspirin and any morning medicines NOT listed above.  You may use sips of water.  5. Plan for one night stay--bring personal belongings. 6. Bring a current list of your medications and current insurance cards. 7. You MUST have a responsible person to drive you home. 8. Someone MUST be with you the first 24 hours after you arrive home or your discharge will be delayed. 9. Please wear clothes that are easy to get on and off and wear slip-on shoes.  Thank you for allowing [place] to care for you!   -- [place] [facility]    Follow-Up: At [facility], you and your health needs are our priority.  As part of our continuing mission to provide you with exceptional heart care, we have created designated Provider Care Teams.  These Care Teams include your primary Cardiologist (physician) and Advanced Practice Providers (APPs -  Physician Assistants and Nurse Practitioners) who all work together to provide you with  the care you need, when you need it.  We recommend signing up for the patient portal called "MyChart".  Sign up information is provided on this After Visit Summary.  MyChart is used to connect with patients for Virtual Visits (Telemedicine).  Patients are able to view lab/test results, encounter notes, upcoming appointments, etc.  Non-urgent messages can be sent to your provider as well.   To learn more about what you can do with MyChart, go to [email or web address].    Your next appointment:   2 - 3 week(s)  The format for your next  appointment:   In Person  Provider:   You may see Nelva Bush, MD or one of the following Advanced Practice Providers on your designated Care Team:   Murray Hodgkins, NP Christell Faith, PA-C Marrianne Mood, PA-C Cadence Dayton, [place]
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
## 2020-11-27 ENCOUNTER — Encounter: Payer: Self-pay | Admitting: Internal Medicine

## 2020-11-27 NOTE — Telephone Encounter (Addendum)
Patients cardiac cath has been scheduled at [facility] on 11/28/20 @ 11:30 am with Dr. Saunders Revel with an arrival time of 10:30 am as was previously given to the patient.

## 2020-11-28 ENCOUNTER — Other Ambulatory Visit: Payer: Self-pay

## 2020-11-28 ENCOUNTER — Ambulatory Visit
Admission: RE | Admit: 2020-11-28 | Discharge: 2020-11-28 | Disposition: A | Discharge status: Home / Self Care | Payer: 59 | Attending: Internal Medicine | Admitting: Internal Medicine

## 2020-11-28 ENCOUNTER — Encounter: Payer: Self-pay | Admitting: Internal Medicine

## 2020-11-28 ENCOUNTER — Other Ambulatory Visit: Payer: Self-pay | Admitting: Internal Medicine

## 2020-11-28 ENCOUNTER — Encounter
Admission: RE | Disposition: A | Discharge status: Home / Self Care | Payer: Self-pay | Source: Home / Self Care | Attending: Internal Medicine

## 2020-11-28 DIAGNOSIS — Z8249 Family history of ischemic heart disease and other diseases of the circulatory system: Secondary | ICD-10-CM | POA: Diagnosis not present

## 2020-11-28 DIAGNOSIS — I25119 Atherosclerotic heart disease of native coronary artery with unspecified angina pectoris: Secondary | ICD-10-CM | POA: Diagnosis not present

## 2020-11-28 DIAGNOSIS — Z87891 Personal history of nicotine dependence: Secondary | ICD-10-CM | POA: Diagnosis not present

## 2020-11-28 DIAGNOSIS — Z79899 Other long term (current) drug therapy: Secondary | ICD-10-CM | POA: Insufficient documentation

## 2020-11-28 DIAGNOSIS — Z882 Allergy status to sulfonamides status: Secondary | ICD-10-CM | POA: Diagnosis not present

## 2020-11-28 DIAGNOSIS — I5022 Chronic systolic (congestive) heart failure: Secondary | ICD-10-CM | POA: Insufficient documentation

## 2020-11-28 DIAGNOSIS — Z7902 Long term (current) use of antithrombotics/antiplatelets: Secondary | ICD-10-CM | POA: Diagnosis not present

## 2020-11-28 DIAGNOSIS — I25110 Atherosclerotic heart disease of native coronary artery with unstable angina pectoris: Secondary | ICD-10-CM | POA: Diagnosis not present

## 2020-11-28 DIAGNOSIS — Z880 Allergy status to penicillin: Secondary | ICD-10-CM | POA: Insufficient documentation

## 2020-11-28 DIAGNOSIS — I200 Unstable angina: Secondary | ICD-10-CM

## 2020-11-28 DIAGNOSIS — Z955 Presence of coronary angioplasty implant and graft: Secondary | ICD-10-CM | POA: Insufficient documentation

## 2020-11-28 DIAGNOSIS — G4733 Obstructive sleep apnea (adult) (pediatric): Secondary | ICD-10-CM | POA: Insufficient documentation

## 2020-11-28 DIAGNOSIS — Z881 Allergy status to other antibiotic agents status: Secondary | ICD-10-CM | POA: Insufficient documentation

## 2020-11-28 DIAGNOSIS — I480 Paroxysmal atrial fibrillation: Secondary | ICD-10-CM | POA: Insufficient documentation

## 2020-11-28 DIAGNOSIS — F319 Bipolar disorder, unspecified: Secondary | ICD-10-CM | POA: Diagnosis not present

## 2020-11-28 DIAGNOSIS — K7460 Unspecified cirrhosis of liver: Secondary | ICD-10-CM | POA: Diagnosis not present

## 2020-11-28 DIAGNOSIS — Z7901 Long term (current) use of anticoagulants: Secondary | ICD-10-CM | POA: Insufficient documentation

## 2020-11-28 DIAGNOSIS — E785 Hyperlipidemia, unspecified: Secondary | ICD-10-CM | POA: Insufficient documentation

## 2020-11-28 DIAGNOSIS — Z7982 Long term (current) use of aspirin: Secondary | ICD-10-CM | POA: Insufficient documentation

## 2020-11-28 HISTORY — DX: Type 2 diabetes mellitus without complications: E11.9

## 2020-11-28 HISTORY — PX: INTRAVASCULAR PRESSURE WIRE/FFR STUDY: CATH118243

## 2020-11-28 HISTORY — PX: LEFT HEART CATH AND CORONARY ANGIOGRAPHY: CATH118249

## 2020-11-28 LAB — GLUCOSE, CAPILLARY: Glucose-Capillary: 104 mg/dL — ABNORMAL HIGH (ref 70–99)

## 2020-11-28 LAB — POCT ACTIVATED CLOTTING TIME: Activated Clotting Time: 260 seconds

## 2020-11-28 SURGERY — LEFT HEART CATH AND CORONARY ANGIOGRAPHY
Anesthesia: Moderate Sedation

## 2020-11-28 MED ORDER — IOHEXOL 350 MG/ML SOLN
INTRAVENOUS | Status: DC | PRN
  Administered 2020-11-28: 61 mL

## 2020-11-28 MED ORDER — MIDAZOLAM HCL 2 MG/2ML IJ SOLN
INTRAMUSCULAR | Status: DC | PRN
  Administered 2020-11-28: 1 mg via INTRAVENOUS

## 2020-11-28 MED ORDER — SODIUM CHLORIDE 0.9 % WEIGHT BASED INFUSION: 1.0000 mL/kg/h | INTRAVENOUS | Status: DC

## 2020-11-28 MED ORDER — ACETAMINOPHEN 325 MG PO TABS: 650.0000 mg | ORAL_TABLET | ORAL | Status: DC | PRN

## 2020-11-28 MED ORDER — SODIUM CHLORIDE 0.9 % IV SOLN: 250.0000 mL | INTRAVENOUS | Status: DC | PRN

## 2020-11-28 MED ORDER — LIDOCAINE HCL 1 % IJ SOLN
INTRAMUSCULAR | Status: AC
  Filled 2020-11-28: qty 20

## 2020-11-28 MED ORDER — HEPARIN SODIUM (PORCINE) 1000 UNIT/ML IJ SOLN
INTRAMUSCULAR | Status: AC
  Filled 2020-11-28: qty 1

## 2020-11-28 MED ORDER — HYDRALAZINE HCL 20 MG/ML IJ SOLN: 10.0000 mg | INTRAMUSCULAR | Status: DC | PRN

## 2020-11-28 MED ORDER — SODIUM CHLORIDE 0.9 % WEIGHT BASED INFUSION
3.0000 mL/kg/h | INTRAVENOUS | Status: AC
  Administered 2020-11-28: 3 mL/kg/h via INTRAVENOUS

## 2020-11-28 MED ORDER — FENTANYL CITRATE (PF) 100 MCG/2ML IJ SOLN
INTRAMUSCULAR | Status: AC
  Filled 2020-11-28: qty 2

## 2020-11-28 MED ORDER — HEPARIN SODIUM (PORCINE) 1000 UNIT/ML IJ SOLN
INTRAMUSCULAR | Status: DC | PRN
  Administered 2020-11-28: 4000 [IU] via INTRAVENOUS
  Administered 2020-11-28: 3500 [IU] via INTRAVENOUS

## 2020-11-28 MED ORDER — SODIUM CHLORIDE 0.9% FLUSH: 3.0000 mL | INTRAVENOUS | Status: DC | PRN

## 2020-11-28 MED ORDER — HEPARIN (PORCINE) IN NACL 1000-0.9 UT/500ML-% IV SOLN
INTRAVENOUS | Status: DC | PRN
  Administered 2020-11-28: 500 mL

## 2020-11-28 MED ORDER — HEPARIN (PORCINE) IN NACL 1000-0.9 UT/500ML-% IV SOLN
INTRAVENOUS | Status: AC
  Filled 2020-11-28: qty 1000

## 2020-11-28 MED ORDER — SODIUM CHLORIDE 0.9% FLUSH: 3.0000 mL | Freq: Two times a day (BID) | INTRAVENOUS | Status: DC

## 2020-11-28 MED ORDER — ASPIRIN 81 MG PO CHEW: 81.0000 mg | CHEWABLE_TABLET | ORAL | Status: DC

## 2020-11-28 MED ORDER — FENTANYL CITRATE (PF) 100 MCG/2ML IJ SOLN
INTRAMUSCULAR | Status: DC | PRN
  Administered 2020-11-28: 25 ug via INTRAVENOUS

## 2020-11-28 MED ORDER — VERAPAMIL HCL 2.5 MG/ML IV SOLN
INTRAVENOUS | Status: DC | PRN
  Administered 2020-11-28 (×2): 2.5 mg via INTRA_ARTERIAL

## 2020-11-28 MED ORDER — NITROGLYCERIN 1 MG/10 ML FOR IR/CATH LAB
INTRA_ARTERIAL | Status: DC | PRN
  Administered 2020-11-28: 200 ug via INTRACORONARY

## 2020-11-28 MED ORDER — MIDAZOLAM HCL 2 MG/2ML IJ SOLN
INTRAMUSCULAR | Status: AC
  Filled 2020-11-28: qty 2

## 2020-11-28 MED ORDER — VERAPAMIL HCL 2.5 MG/ML IV SOLN
INTRAVENOUS | Status: AC
  Filled 2020-11-28: qty 2

## 2020-11-28 MED ORDER — LABETALOL HCL 5 MG/ML IV SOLN: 10.0000 mg | INTRAVENOUS | Status: DC | PRN

## 2020-11-28 MED ORDER — LIDOCAINE HCL (PF) 1 % IJ SOLN
INTRAMUSCULAR | Status: DC | PRN
  Administered 2020-11-28: 2 mL via SUBCUTANEOUS

## 2020-11-28 SURGICAL SUPPLY — 14 items
CATH INFINITI 5FR ANG PIGTAIL (CATHETERS) ×2 IMPLANT
CATH INFINITI 5FR JK (CATHETERS) ×2 IMPLANT
CATH LAUNCHER 5F EBU3.5 (CATHETERS) ×2 IMPLANT
DEVICE RAD TR BAND REGULAR (VASCULAR PRODUCTS) ×2 IMPLANT
DRAPE BRACHIAL (DRAPES) ×2 IMPLANT
GLIDESHEATH SLEND SS 6F .021 (SHEATH) ×2 IMPLANT
GUIDEWIRE INQWIRE 1.5J.035X260 (WIRE) ×1 IMPLANT
GUIDEWIRE PRESS OMNI 185 ST (WIRE) ×2 IMPLANT
INQWIRE 1.5J .035X260CM (WIRE) ×2
KIT ENCORE 26 ADVANTAGE (KITS) ×2 IMPLANT
PACK CARDIAC CATH (CUSTOM PROCEDURE TRAY) ×2 IMPLANT
PROTECTION STATION PRESSURIZED (MISCELLANEOUS) ×2
SET ATX SIMPLICITY (MISCELLANEOUS) ×2 IMPLANT
STATION PROTECTION PRESSURIZED (MISCELLANEOUS) ×1 IMPLANT

## 2020-11-28 NOTE — Interval H&P Note (Signed)
History and Physical Interval Note:  11/28/2020 12:07 PM  Ariana Riggs  has presented today for surgery, with the diagnosis of accelerating angina.  The various methods of treatment have been discussed with the patient and family. After consideration of risks, benefits and other options for treatment, the patient has consented to  Procedure(s): LEFT HEART CATH AND CORONARY ANGIOGRAPHY (N/A) as a surgical intervention.  The patient's history has been reviewed, patient examined, no change in status, stable for surgery.  I have reviewed the patient's chart and labs.  Questions were answered to the patient's satisfaction.    Cath Lab Visit (complete for each Cath Lab visit)  Clinical Evaluation Leading to the Procedure:   ACS: No.  Non-ACS:    Anginal Classification: CCS IV  Anti-ischemic medical therapy: Minimal Therapy (1 class of medications)  Non-Invasive Test Results: No non-invasive testing performed  Prior CABG: No previous CABG  Thena Devora

## 2020-12-01 ENCOUNTER — Encounter: Payer: Self-pay | Admitting: Internal Medicine

## 2020-12-12 ENCOUNTER — Ambulatory Visit (INDEPENDENT_AMBULATORY_CARE_PROVIDER_SITE_OTHER): Payer: 59

## 2020-12-12 ENCOUNTER — Ambulatory Visit (INDEPENDENT_AMBULATORY_CARE_PROVIDER_SITE_OTHER): Payer: 59 | Admitting: Nurse Practitioner

## 2020-12-12 ENCOUNTER — Other Ambulatory Visit: Payer: Self-pay

## 2020-12-12 ENCOUNTER — Encounter: Payer: Self-pay | Admitting: Nurse Practitioner

## 2020-12-12 VITALS — BP 120/68 | HR 66 | Ht 66.0 in | Wt 172.2 lb

## 2020-12-12 DIAGNOSIS — I25119 Atherosclerotic heart disease of native coronary artery with unspecified angina pectoris: Secondary | ICD-10-CM

## 2020-12-12 DIAGNOSIS — I255 Ischemic cardiomyopathy: Secondary | ICD-10-CM

## 2020-12-12 DIAGNOSIS — E785 Hyperlipidemia, unspecified: Secondary | ICD-10-CM | POA: Diagnosis not present

## 2020-12-12 DIAGNOSIS — I5022 Chronic systolic (congestive) heart failure: Secondary | ICD-10-CM

## 2020-12-12 DIAGNOSIS — R55 Syncope and collapse: Secondary | ICD-10-CM

## 2020-12-12 DIAGNOSIS — Z720 Tobacco use: Secondary | ICD-10-CM

## 2020-12-12 DIAGNOSIS — I480 Paroxysmal atrial fibrillation: Secondary | ICD-10-CM

## 2020-12-12 NOTE — Patient Instructions (Signed)
Medication Instructions:  No changes at this time.   *If you need a refill on your cardiac medications before your next appointment, please call your pharmacy*   Lab Work: None  If you have labs (blood work) drawn today and your tests are completely normal, you will receive your results only by: [place] (if you have MyChart) OR A paper copy in the mail If you have any lab test that is abnormal or we need to change your treatment, we will call you to review the results.   Testing/Procedures: Your physician has recommended that you wear a Zio monitor for 2 weeks. On November the 11 th remove and place in box provided then place in mail.   This monitor is a medical device that records the heart's electrical activity. Doctors most often use these monitors to diagnose arrhythmias. Arrhythmias are problems with the speed or rhythm of the heartbeat. The monitor is a small device applied to your chest. You can wear one while you do your normal daily activities. While wearing this monitor if you have any symptoms to push the button and record what you felt. Once you have worn this monitor for the period of time provider prescribed (Usually 14 days), you will return the monitor device in the postage paid box. Once it is returned they will download the data collected and provide [place] with a report which the provider will then review and we will call you with those results. Important tips:  Avoid showering during the first 24 hours of wearing the monitor. Avoid excessive sweating to help maximize wear time. Do not submerge the device, no hot tubs, and no swimming pools. Keep any lotions or oils away from the patch. After 24 hours you may shower with the patch on. Take brief showers with your back facing the shower head.  Do not remove patch once it has been placed because that will interrupt data and decrease adhesive wear time. Push the button when you have any symptoms and write down what you  were feeling. Once you have completed wearing your monitor, remove and place into box which has postage paid and place in your outgoing mailbox.  If for some reason you have misplaced your box then call our office and we can provide another box and/or mail it off for you.    Follow-Up: At [facility], you and your health needs are our priority.  As part of our continuing mission to provide you with exceptional heart care, we have created designated Provider Care Teams.  These Care Teams include your primary Cardiologist (physician) and Advanced Practice Providers (APPs -  Physician Assistants and Nurse Practitioners) who all work together to provide you with the care you need, when you need it.   Your next appointment:   6 week(s)  The format for your next appointment:   In Person  Provider:   Nelva Bush, MD or Murray Hodgkins, NP
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
## 2020-12-16 NOTE — Addendum Note (Signed)
Addended by: Janan Ridge on: 12/16/2020 01:45 PM   Modules accepted: Orders

## 2020-12-24 ENCOUNTER — Telehealth: Payer: Self-pay

## 2020-12-24 NOTE — Telephone Encounter (Signed)
To Ignacia Bayley, NP/ Jeannene Patella, RN.  Reviewed Epic and the patient's monitor has not uploaded yet.   FYI only!

## 2020-12-24 NOTE — Telephone Encounter (Signed)
Patient calling states did not wear monitor for 2wks it fell off after a week & she sent it back by mail

## 2020-12-25 DIAGNOSIS — I471 Supraventricular tachycardia, unspecified: Secondary | ICD-10-CM

## 2020-12-25 HISTORY — DX: Supraventricular tachycardia: I47.1

## 2020-12-25 HISTORY — DX: Supraventricular tachycardia, unspecified: I47.10

## 2020-12-25 NOTE — Telephone Encounter (Signed)
Reviewed report through [employer].  18 brief runs of SVT, though the longest was only 4 beats.  The lone triggered event was during sinus rhythm.  No significant arrhythmias and overall reassuring.  No need to place another monitor.

## 2020-12-25 NOTE — Telephone Encounter (Signed)
Called and reviewed results and recommendations with patient. Confirmed her next appointment and she verbalized understanding with no further questions at this time.

## 2020-12-25 NOTE — Telephone Encounter (Signed)
Called to speak with patient about her monitor. She states she wore it from a Friday to Thursday and then it fell off. During that time she developed a cold with coughing. So she wanted [place] to know about that because she feels it probably interfered with the readings. Advised that I would make provider aware but they are able to see and differentiate artifact. Discussed that we can wait to see if this report shows them enough information and if needed we could order another monitor. Let her know that I would update provider on this information and would be in touch if he should have any other recommendations.

## 2021-01-26 DIAGNOSIS — R413 Other amnesia: Secondary | ICD-10-CM | POA: Insufficient documentation

## 2021-01-27 ENCOUNTER — Other Ambulatory Visit: Payer: Self-pay | Admitting: Gerontology

## 2021-01-27 DIAGNOSIS — Z1231 Encounter for screening mammogram for malignant neoplasm of breast: Secondary | ICD-10-CM

## 2021-01-30 ENCOUNTER — Other Ambulatory Visit: Payer: Self-pay

## 2021-01-30 ENCOUNTER — Encounter: Payer: Self-pay | Admitting: Nurse Practitioner

## 2021-01-30 ENCOUNTER — Ambulatory Visit (INDEPENDENT_AMBULATORY_CARE_PROVIDER_SITE_OTHER): Payer: 59 | Admitting: Nurse Practitioner

## 2021-01-30 VITALS — BP 126/70 | HR 71 | Ht 66.0 in | Wt 169.0 lb

## 2021-01-30 DIAGNOSIS — I2510 Atherosclerotic heart disease of native coronary artery without angina pectoris: Secondary | ICD-10-CM | POA: Diagnosis not present

## 2021-01-30 DIAGNOSIS — E785 Hyperlipidemia, unspecified: Secondary | ICD-10-CM

## 2021-01-30 DIAGNOSIS — E0590 Thyrotoxicosis, unspecified without thyrotoxic crisis or storm: Secondary | ICD-10-CM

## 2021-01-30 DIAGNOSIS — I10 Essential (primary) hypertension: Secondary | ICD-10-CM | POA: Diagnosis not present

## 2021-01-30 DIAGNOSIS — F1010 Alcohol abuse, uncomplicated: Secondary | ICD-10-CM

## 2021-01-30 DIAGNOSIS — Z720 Tobacco use: Secondary | ICD-10-CM

## 2021-01-30 DIAGNOSIS — I255 Ischemic cardiomyopathy: Secondary | ICD-10-CM

## 2021-01-30 DIAGNOSIS — I480 Paroxysmal atrial fibrillation: Secondary | ICD-10-CM | POA: Diagnosis not present

## 2021-01-30 DIAGNOSIS — I5020 Unspecified systolic (congestive) heart failure: Secondary | ICD-10-CM

## 2021-01-30 MED ORDER — METOPROLOL SUCCINATE ER 50 MG PO TB24: 50.0000 mg | ORAL_TABLET | Freq: Every day | ORAL | 3 refills | Status: DC

## 2021-01-30 NOTE — Patient Instructions (Signed)
Medication Instructions:  Your physician has recommended you make the following change in your medication:   INCREASE Metoprolol succinate (Toprol XL) to 50 mg once a day  *If you need a refill on your cardiac medications before your next appointment, please call your pharmacy*   Lab Work: None  If you have labs (blood work) drawn today and your tests are completely normal, you will receive your results only by: [place] (if you have MyChart) OR A paper copy in the mail If you have any lab test that is abnormal or we need to change your treatment, we will call you to review the results.   Testing/Procedures: None    Follow-Up: At [facility], you and your health needs are our priority.  As part of our continuing mission to provide you with exceptional heart care, we have created designated Provider Care Teams.  These Care Teams include your primary Cardiologist (physician) and Advanced Practice Providers (APPs -  Physician Assistants and Nurse Practitioners) who all work together to provide you with the care you need, when you need it.   Your next appointment:   3 month(s)  The format for your next appointment:   In Person  Provider:   Nelva Bush, MD or Murray Hodgkins, NP
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
## 2021-01-31 ENCOUNTER — Other Ambulatory Visit: Payer: Self-pay | Admitting: Internal Medicine

## 2021-02-12 ENCOUNTER — Inpatient Hospital Stay: Admission: RE | Admit: 2021-02-12 | Payer: 59 | Source: Ambulatory Visit

## 2021-02-16 ENCOUNTER — Other Ambulatory Visit: Payer: Self-pay

## 2021-02-16 ENCOUNTER — Ambulatory Visit
Admission: EM | Admit: 2021-02-16 | Discharge: 2021-02-16 | Disposition: A | Discharge status: Home / Self Care | Payer: 59 | Attending: Physician Assistant | Admitting: Physician Assistant

## 2021-02-16 ENCOUNTER — Ambulatory Visit (INDEPENDENT_AMBULATORY_CARE_PROVIDER_SITE_OTHER): Payer: 59

## 2021-02-16 ENCOUNTER — Encounter: Payer: Self-pay | Admitting: Emergency Medicine

## 2021-02-16 DIAGNOSIS — R0602 Shortness of breath: Secondary | ICD-10-CM

## 2021-02-16 DIAGNOSIS — R051 Acute cough: Secondary | ICD-10-CM

## 2021-02-16 DIAGNOSIS — J019 Acute sinusitis, unspecified: Secondary | ICD-10-CM | POA: Diagnosis not present

## 2021-02-16 DIAGNOSIS — R059 Cough, unspecified: Secondary | ICD-10-CM | POA: Diagnosis not present

## 2021-02-16 DIAGNOSIS — J441 Chronic obstructive pulmonary disease with (acute) exacerbation: Secondary | ICD-10-CM

## 2021-02-16 MED ORDER — PREDNISONE 20 MG PO TABS
40.0000 mg | ORAL_TABLET | Freq: Every day | ORAL | 0 refills | Status: AC
Start: 2021-02-16 — End: 2021-02-21

## 2021-02-16 MED ORDER — AZITHROMYCIN 250 MG PO TABS: 250.0000 mg | ORAL_TABLET | Freq: Every day | ORAL | 0 refills | Status: DC

## 2021-02-16 MED ORDER — BENZONATATE 200 MG PO CAPS: 200.0000 mg | ORAL_CAPSULE | Freq: Three times a day (TID) | ORAL | 0 refills | Status: DC | PRN

## 2021-02-16 NOTE — Discharge Instructions (Signed)
-  I have sent an antibiotic, prednisone and cough medicine to pharmacy.  Increase rest and fluids. - Go to ER for any increased breathing difficulty, chest pain, weakness.  Continue with your Flonase and Coricidin HBP.

## 2021-02-16 NOTE — ED Triage Notes (Signed)
PT reports cough, shortness of breath, and facial pain for 8 days. Notes that she has COPD and asthma
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
## 2021-03-11 ENCOUNTER — Other Ambulatory Visit: Payer: Self-pay | Admitting: Gastroenterology

## 2021-03-11 DIAGNOSIS — K7030 Alcoholic cirrhosis of liver without ascites: Secondary | ICD-10-CM

## 2021-03-13 ENCOUNTER — Telehealth: Payer: Self-pay | Admitting: Nurse Practitioner

## 2021-03-13 MED ORDER — APIXABAN 5 MG PO TABS: 5.0000 mg | ORAL_TABLET | Freq: Two times a day (BID) | ORAL | 1 refills | Status: DC

## 2021-03-13 NOTE — Telephone Encounter (Signed)
° °  Pre-operative Risk Assessment    Patient Name: Ariana Riggs  DOB: July 28, 1960 MRN: [id number]      Request for Surgical Clearance    Procedure:   upper endoscopy  Date of Surgery:  Clearance 05/25/21                                 Surgeon:  Fre[place] or Practice Name:  [facility]  Phone number:  [phone] Fax number:  [phone]   Type of Clearance Requested:   - Medical  - Pharmacy:  Hold Apixaban (Eliquis) Eliquis    Type of Anesthesia:  Not Indicated   Additional requests/questions:    Jonathon Jordan   03/13/2021, 1:01 PM

## 2021-03-13 NOTE — Telephone Encounter (Signed)
Patient with diagnosis of afib on Eliquis for anticoagulation.    Procedure: upper endoscopy Date of procedure: 05/25/21  CHA2DS2-VASc Score = 4  This indicates a 4.8% annual risk of stroke. The patient's score is based upon: CHF History: 1 HTN History: 0 Diabetes History: 1 Stroke History: 0 Vascular Disease History: 1 Age Score: 0 Gender Score: 1   CrCl 25mL/min using adjusted body weight Platelet count 127K  Per office protocol, patient can hold Eliquis for 1-2 days prior to procedure.

## 2021-03-16 ENCOUNTER — Other Ambulatory Visit: Payer: Self-pay

## 2021-03-16 ENCOUNTER — Ambulatory Visit
Admission: RE | Admit: 2021-03-16 | Discharge: 2021-03-16 | Disposition: A | Discharge status: Home / Self Care | Payer: 59 | Source: Ambulatory Visit | Attending: Gastroenterology | Admitting: Gastroenterology

## 2021-03-16 DIAGNOSIS — K7030 Alcoholic cirrhosis of liver without ascites: Secondary | ICD-10-CM | POA: Insufficient documentation

## 2021-04-15 ENCOUNTER — Other Ambulatory Visit: Payer: Self-pay | Admitting: Internal Medicine

## 2021-04-19 ENCOUNTER — Inpatient Hospital Stay
Admission: EM | Admit: 2021-04-19 | Discharge: 2021-04-24 | DRG: 643 | Disposition: A | Discharge status: Home / Self Care | Payer: 59 | Attending: Internal Medicine | Admitting: Internal Medicine

## 2021-04-19 ENCOUNTER — Other Ambulatory Visit: Payer: Self-pay

## 2021-04-19 ENCOUNTER — Emergency Department: Payer: 59

## 2021-04-19 DIAGNOSIS — K7460 Unspecified cirrhosis of liver: Secondary | ICD-10-CM | POA: Diagnosis present

## 2021-04-19 DIAGNOSIS — Z6828 Body mass index (BMI) 28.0-28.9, adult: Secondary | ICD-10-CM | POA: Diagnosis not present

## 2021-04-19 DIAGNOSIS — Z818 Family history of other mental and behavioral disorders: Secondary | ICD-10-CM

## 2021-04-19 DIAGNOSIS — I5042 Chronic combined systolic (congestive) and diastolic (congestive) heart failure: Secondary | ICD-10-CM | POA: Diagnosis present

## 2021-04-19 DIAGNOSIS — F17210 Nicotine dependence, cigarettes, uncomplicated: Secondary | ICD-10-CM | POA: Diagnosis present

## 2021-04-19 DIAGNOSIS — F1020 Alcohol dependence, uncomplicated: Secondary | ICD-10-CM | POA: Diagnosis present

## 2021-04-19 DIAGNOSIS — E663 Overweight: Secondary | ICD-10-CM | POA: Diagnosis present

## 2021-04-19 DIAGNOSIS — I4891 Unspecified atrial fibrillation: Secondary | ICD-10-CM | POA: Diagnosis not present

## 2021-04-19 DIAGNOSIS — J449 Chronic obstructive pulmonary disease, unspecified: Secondary | ICD-10-CM | POA: Diagnosis present

## 2021-04-19 DIAGNOSIS — I493 Ventricular premature depolarization: Secondary | ICD-10-CM | POA: Diagnosis present

## 2021-04-19 DIAGNOSIS — A419 Sepsis, unspecified organism: Secondary | ICD-10-CM

## 2021-04-19 DIAGNOSIS — F319 Bipolar disorder, unspecified: Secondary | ICD-10-CM | POA: Diagnosis present

## 2021-04-19 DIAGNOSIS — K529 Noninfective gastroenteritis and colitis, unspecified: Secondary | ICD-10-CM | POA: Diagnosis not present

## 2021-04-19 DIAGNOSIS — T462X5A Adverse effect of other antidysrhythmic drugs, initial encounter: Secondary | ICD-10-CM | POA: Diagnosis present

## 2021-04-19 DIAGNOSIS — K219 Gastro-esophageal reflux disease without esophagitis: Secondary | ICD-10-CM | POA: Diagnosis present

## 2021-04-19 DIAGNOSIS — R188 Other ascites: Secondary | ICD-10-CM | POA: Diagnosis not present

## 2021-04-19 DIAGNOSIS — K7031 Alcoholic cirrhosis of liver with ascites: Secondary | ICD-10-CM | POA: Diagnosis present

## 2021-04-19 DIAGNOSIS — F1010 Alcohol abuse, uncomplicated: Secondary | ICD-10-CM | POA: Diagnosis present

## 2021-04-19 DIAGNOSIS — Z8249 Family history of ischemic heart disease and other diseases of the circulatory system: Secondary | ICD-10-CM

## 2021-04-19 DIAGNOSIS — A0811 Acute gastroenteropathy due to Norwalk agent: Secondary | ICD-10-CM | POA: Diagnosis present

## 2021-04-19 DIAGNOSIS — Z720 Tobacco use: Secondary | ICD-10-CM | POA: Diagnosis not present

## 2021-04-19 DIAGNOSIS — I5020 Unspecified systolic (congestive) heart failure: Secondary | ICD-10-CM

## 2021-04-19 DIAGNOSIS — I25119 Atherosclerotic heart disease of native coronary artery with unspecified angina pectoris: Secondary | ICD-10-CM | POA: Diagnosis present

## 2021-04-19 DIAGNOSIS — Z20822 Contact with and (suspected) exposure to covid-19: Secondary | ICD-10-CM | POA: Diagnosis present

## 2021-04-19 DIAGNOSIS — Z881 Allergy status to other antibiotic agents status: Secondary | ICD-10-CM | POA: Diagnosis not present

## 2021-04-19 DIAGNOSIS — Z882 Allergy status to sulfonamides status: Secondary | ICD-10-CM

## 2021-04-19 DIAGNOSIS — R578 Other shock: Secondary | ICD-10-CM | POA: Diagnosis present

## 2021-04-19 DIAGNOSIS — Z880 Allergy status to penicillin: Secondary | ICD-10-CM

## 2021-04-19 DIAGNOSIS — I110 Hypertensive heart disease with heart failure: Secondary | ICD-10-CM | POA: Diagnosis present

## 2021-04-19 DIAGNOSIS — Z955 Presence of coronary angioplasty implant and graft: Secondary | ICD-10-CM

## 2021-04-19 DIAGNOSIS — E0591 Thyrotoxicosis, unspecified with thyrotoxic crisis or storm: Principal | ICD-10-CM | POA: Diagnosis present

## 2021-04-19 DIAGNOSIS — G2581 Restless legs syndrome: Secondary | ICD-10-CM | POA: Diagnosis present

## 2021-04-19 DIAGNOSIS — I255 Ischemic cardiomyopathy: Secondary | ICD-10-CM | POA: Diagnosis present

## 2021-04-19 DIAGNOSIS — R9431 Abnormal electrocardiogram [ECG] [EKG]: Secondary | ICD-10-CM | POA: Diagnosis not present

## 2021-04-19 DIAGNOSIS — I4819 Other persistent atrial fibrillation: Secondary | ICD-10-CM | POA: Diagnosis present

## 2021-04-19 DIAGNOSIS — I083 Combined rheumatic disorders of mitral, aortic and tricuspid valves: Secondary | ICD-10-CM | POA: Diagnosis present

## 2021-04-19 DIAGNOSIS — E119 Type 2 diabetes mellitus without complications: Secondary | ICD-10-CM | POA: Diagnosis present

## 2021-04-19 DIAGNOSIS — E064 Drug-induced thyroiditis: Secondary | ICD-10-CM | POA: Diagnosis present

## 2021-04-19 DIAGNOSIS — Z888 Allergy status to other drugs, medicaments and biological substances status: Secondary | ICD-10-CM

## 2021-04-19 DIAGNOSIS — R1011 Right upper quadrant pain: Secondary | ICD-10-CM

## 2021-04-19 DIAGNOSIS — E782 Mixed hyperlipidemia: Secondary | ICD-10-CM | POA: Diagnosis present

## 2021-04-19 DIAGNOSIS — Z794 Long term (current) use of insulin: Secondary | ICD-10-CM

## 2021-04-19 DIAGNOSIS — Z79899 Other long term (current) drug therapy: Secondary | ICD-10-CM

## 2021-04-19 DIAGNOSIS — I428 Other cardiomyopathies: Secondary | ICD-10-CM | POA: Diagnosis present

## 2021-04-19 DIAGNOSIS — Z7901 Long term (current) use of anticoagulants: Secondary | ICD-10-CM

## 2021-04-19 DIAGNOSIS — G4733 Obstructive sleep apnea (adult) (pediatric): Secondary | ICD-10-CM | POA: Diagnosis present

## 2021-04-19 DIAGNOSIS — Z833 Family history of diabetes mellitus: Secondary | ICD-10-CM

## 2021-04-19 DIAGNOSIS — F419 Anxiety disorder, unspecified: Secondary | ICD-10-CM | POA: Diagnosis present

## 2021-04-19 HISTORY — DX: Unspecified cirrhosis of liver: K74.60

## 2021-04-19 LAB — CBC WITH DIFFERENTIAL/PLATELET
Abs Immature Granulocytes: 0.02 10*3/uL (ref 0.00–0.07)
Basophils Absolute: 0 10*3/uL (ref 0.0–0.1)
Basophils Relative: 0 %
Eosinophils Absolute: 0 10*3/uL (ref 0.0–0.5)
Eosinophils Relative: 0 %
HCT: 37 % (ref 36.0–46.0)
Hemoglobin: 12.3 g/dL (ref 12.0–15.0)
Immature Granulocytes: 1 %
Lymphocytes Relative: 13 %
Lymphs Abs: 0.6 10*3/uL — ABNORMAL LOW (ref 0.7–4.0)
MCH: 28.8 pg (ref 26.0–34.0)
MCHC: 33.2 g/dL (ref 30.0–36.0)
MCV: 86.7 fL (ref 80.0–100.0)
Monocytes Absolute: 0.3 10*3/uL (ref 0.1–1.0)
Monocytes Relative: 7 %
Neutro Abs: 3.4 10*3/uL (ref 1.7–7.7)
Neutrophils Relative %: 79 %
Platelets: 120 10*3/uL — ABNORMAL LOW (ref 150–400)
RBC: 4.27 MIL/uL (ref 3.87–5.11)
RDW: 14.9 % (ref 11.5–15.5)
WBC: 4.3 10*3/uL (ref 4.0–10.5)
nRBC: 0 % (ref 0.0–0.2)

## 2021-04-19 LAB — COMPREHENSIVE METABOLIC PANEL
ALT: 15 U/L (ref 0–44)
AST: 21 U/L (ref 15–41)
Albumin: 4 g/dL (ref 3.5–5.0)
Alkaline Phosphatase: 128 U/L — ABNORMAL HIGH (ref 38–126)
Anion gap: 10 (ref 5–15)
BUN: 14 mg/dL (ref 8–23)
CO2: 24 mmol/L (ref 22–32)
Calcium: 9.1 mg/dL (ref 8.9–10.3)
Chloride: 105 mmol/L (ref 98–111)
Creatinine, Ser: 0.71 mg/dL (ref 0.44–1.00)
GFR, Estimated: >60 mL/min (ref >60)
Glucose, Bld: 100 mg/dL — ABNORMAL HIGH (ref 70–99)
Potassium: 4.1 mmol/L (ref 3.5–5.1)
Sodium: 139 mmol/L (ref 135–145)
Total Bilirubin: 1.2 mg/dL (ref 0.3–1.2)
Total Protein: 7.5 g/dL (ref 6.5–8.1)

## 2021-04-19 LAB — LACTIC ACID, PLASMA: Lactic Acid, Venous: 0.7 mmol/L (ref 0.5–1.9)

## 2021-04-19 LAB — PROTIME-INR
INR: 1.4 — ABNORMAL HIGH (ref 0.8–1.2)
Prothrombin Time: 17.3 seconds — ABNORMAL HIGH (ref 11.4–15.2)

## 2021-04-19 MED ORDER — VANCOMYCIN HCL IN DEXTROSE 1-5 GM/200ML-% IV SOLN
1000.0000 mg | Freq: Once | INTRAVENOUS | Status: AC
  Administered 2021-04-19: 1000 mg via INTRAVENOUS
  Filled 2021-04-19: qty 200

## 2021-04-19 MED ORDER — FENTANYL CITRATE PF 50 MCG/ML IJ SOSY
75.0000 ug | PREFILLED_SYRINGE | Freq: Once | INTRAMUSCULAR | Status: AC
  Administered 2021-04-19: 75 ug via INTRAVENOUS
  Filled 2021-04-19: qty 2

## 2021-04-19 MED ORDER — PHENYLEPHRINE HCL-NACL 20-0.9 MG/250ML-% IV SOLN
0.0000 ug/min | INTRAVENOUS | Status: DC
  Administered 2021-04-19: 20 ug/min via INTRAVENOUS
  Filled 2021-04-19: qty 250

## 2021-04-19 MED ORDER — IOHEXOL 300 MG/ML  SOLN
100.0000 mL | Freq: Once | INTRAMUSCULAR | Status: AC | PRN
  Administered 2021-04-19: 100 mL via INTRAVENOUS

## 2021-04-19 MED ORDER — DILTIAZEM HCL 25 MG/5ML IV SOLN
10.0000 mg | Freq: Once | INTRAVENOUS | Status: AC
  Administered 2021-04-19: 10 mg via INTRAVENOUS
  Filled 2021-04-19: qty 5

## 2021-04-19 MED ORDER — ACETAMINOPHEN 325 MG PO TABS
650.0000 mg | ORAL_TABLET | Freq: Once | ORAL | Status: AC
  Administered 2021-04-19: 650 mg via ORAL
  Filled 2021-04-19: qty 2

## 2021-04-19 MED ORDER — SODIUM CHLORIDE 0.9 % IV SOLN
2.0000 g | Freq: Once | INTRAVENOUS | Status: AC
  Administered 2021-04-19: 2 g via INTRAVENOUS
  Filled 2021-04-19: qty 2

## 2021-04-19 MED ORDER — METRONIDAZOLE 500 MG/100ML IV SOLN
500.0000 mg | Freq: Once | INTRAVENOUS | Status: AC
  Administered 2021-04-19: 500 mg via INTRAVENOUS
  Filled 2021-04-19: qty 100

## 2021-04-19 MED ORDER — INSULIN ASPART 100 UNIT/ML IJ SOLN
0.0000 [IU] | INTRAMUSCULAR | Status: DC
  Administered 2021-04-20 (×3): 3 [IU] via SUBCUTANEOUS
  Administered 2021-04-21 – 2021-04-22 (×8): 2 [IU] via SUBCUTANEOUS
  Administered 2021-04-22: 12:00:00 3 [IU] via SUBCUTANEOUS
  Administered 2021-04-23 (×4): 2 [IU] via SUBCUTANEOUS
  Filled 2021-04-19 (×16): qty 1

## 2021-04-19 MED ORDER — SODIUM CHLORIDE 0.9 % IV BOLUS
500.0000 mL | Freq: Once | INTRAVENOUS | Status: AC
  Administered 2021-04-19: 500 mL via INTRAVENOUS

## 2021-04-19 MED ORDER — SODIUM CHLORIDE 0.9 % IV BOLUS (SEPSIS)
500.0000 mL | Freq: Once | INTRAVENOUS | Status: AC
  Administered 2021-04-19: 500 mL via INTRAVENOUS

## 2021-04-19 MED ORDER — POLYETHYLENE GLYCOL 3350 17 G PO PACK: 17.0000 g | PACK | Freq: Every day | ORAL | Status: DC | PRN

## 2021-04-19 MED ORDER — ONDANSETRON HCL 4 MG/2ML IJ SOLN
4.0000 mg | Freq: Once | INTRAMUSCULAR | Status: AC
  Administered 2021-04-19: 4 mg via INTRAVENOUS
  Filled 2021-04-19: qty 2

## 2021-04-19 MED ORDER — VANCOMYCIN HCL 500 MG/100ML IV SOLN
500.0000 mg | Freq: Once | INTRAVENOUS | Status: AC
  Administered 2021-04-20: 500 mg via INTRAVENOUS
  Filled 2021-04-19: qty 100

## 2021-04-19 MED ORDER — DOCUSATE SODIUM 100 MG PO CAPS: 100.0000 mg | ORAL_CAPSULE | Freq: Two times a day (BID) | ORAL | Status: DC | PRN

## 2021-04-19 NOTE — ED Notes (Signed)
Report received from Brandon, RN ?

## 2021-04-19 NOTE — ED Provider Notes (Signed)
[facility] Provider Note    Event Date/Time   First MD Initiated Contact with Patient 04/19/21 1950     (approximate)   History   Abdominal Pain   HPI  Ariana Riggs is a 61 y.o. female here with abdominal pain, weakness, diarrhea.  Patient states that over the last 4 days, she has had diffuse abdominal pain, profuse diarrhea, weakness, fatigue, as well as nausea.  She has not had much vomiting other than 1-2 episodes.  She is also had associated palpitations, shortness of breath, and has felt generally lightheaded throughout the day today.  She noticed that she was febrile, tachycardic, and felt very unwell today so she presents for further evaluation.  Denies known sick contacts.  She does note that she ate takeout that several other people had felt unwell after eating yesterday.  No other specific sick contacts.  No blood in her diarrhea or emesis     Physical Exam   Triage Vital Signs: ED Triage Vitals  Enc Vitals Group     BP 04/19/21 1916 (!) 129/98     Pulse Rate 04/19/21 1916 (!) 131     Resp 04/19/21 1916 (!) 24     Temp 04/19/21 1916 (!) 101.9 F (38.8 C)     Temp Source 04/19/21 1916 Oral     SpO2 04/19/21 1916 97 %     Weight 04/19/21 1918 165 lb (74.8 kg)     Height 04/19/21 1918 '5\' 6"'$  (1.676 m)     Head Circumference --      Peak Flow --      Pain Score 04/19/21 1918 10     Pain Loc --      Pain Edu? --      Excl. in [place]? --     Most recent vital signs: Vitals:   04/20/21 0115 04/20/21 0144  BP: (!) 71/61 124/64  Pulse: (!) 136 (!) 125  Resp: 19 19  Temp:  100.2 F (37.9 C)  SpO2: 97% 98%     General: Awake, no distress.  CV:  Tachycardic, irregular rhythm.  Slightly diminished peripheral perfusion. Resp:  Tachypneic, but clear breath sounds bilaterally. Abd:  No distention.  Moderate, diffuse tenderness, primarily in the epigastric area.  No overt peritonitis. Other:  Trace bilateral edema.   ED Results /  Procedures / Treatments   Labs (all labs ordered are listed, but only abnormal results are displayed) Labs Reviewed  COMPREHENSIVE METABOLIC PANEL - Abnormal; Notable for the following components:      Result Value   Glucose, Bld 100 (*)    Alkaline Phosphatase 128 (*)    All other components within normal limits  CBC WITH DIFFERENTIAL/PLATELET - Abnormal; Notable for the following components:   Platelets 120 (*)    Lymphs Abs 0.6 (*)    All other components within normal limits  PROTIME-INR - Abnormal; Notable for the following components:   Prothrombin Time 17.3 (*)    INR 1.4 (*)    All other components within normal limits  TSH - Abnormal; Notable for the following components:   TSH <0.010 (*)    All other components within normal limits  T4, FREE - Abnormal; Notable for the following components:   Free T4 2.02 (*)    All other components within normal limits  BRAIN NATRIURETIC PEPTIDE - Abnormal; Notable for the following components:   B Natriuretic Peptide 423.5 (*)    All other components within normal limits  CBG MONITORING, ED - Abnormal; Notable for the following components:   Glucose-Capillary 109 (*)    All other components within normal limits  RESP PANEL BY RT-PCR (FLU A&B, COVID) ARPGX2  CULTURE, BLOOD (ROUTINE X 2)  GASTROINTESTINAL PANEL BY PCR, STOOL (REPLACES STOOL CULTURE)  CULTURE, BLOOD (ROUTINE X 2) W REFLEX TO ID PANEL  MRSA NEXT GEN BY PCR, NASAL  LACTIC ACID, PLASMA  URINALYSIS, ROUTINE W REFLEX MICROSCOPIC  PROCALCITONIN  PROCALCITONIN  URINE DRUG SCREEN, QUALITATIVE ([facility] ONLY)  LIPASE, BLOOD  CBC  BASIC METABOLIC PANEL  MAGNESIUM  PHOSPHORUS  LACTIC ACID, PLASMA  ETHANOL  HIV ANTIBODY (ROUTINE TESTING W REFLEX)  HEMOGLOBIN A1C  TROPONIN I (HIGH SENSITIVITY)  TROPONIN I (HIGH SENSITIVITY)     EKG Atrial fibrillation with rapid ventricular response, ventricular rate 125.  QRS 102, QTc 444.  No acute ST elevations or depressions.  No  EKG evidence of acute ischemia or infarct.   RADIOLOGY CT abdomen/pelvis: Cirrhosis, possible diarrheal illness, no surgical abnormality Chest x-ray: No pneumonia   I also independently reviewed and agree wit radiologist interpretations.   PROCEDURES:  Critical Care performed: Yes, see critical care procedure note(s)  .Critical Care Performed by: Duffy Bruce, MD Authorized by: Duffy Bruce, MD   Critical care provider statement:    Critical care time (minutes):  30   Critical care time was exclusive of:  Separately billable procedures and treating other patients   Critical care was necessary to treat or prevent imminent or life-threatening deterioration of the following conditions:  Cardiac failure, circulatory failure, respiratory failure and sepsis   Critical care was time spent personally by me on the following activities:  Development of treatment plan with patient or surrogate, discussions with consultants, evaluation of patient's response to treatment, examination of patient, ordering and review of laboratory studies, ordering and review of radiographic studies, ordering and performing treatments and interventions, pulse oximetry, re-evaluation of patient's condition and review of [facility]: Medications  phenylephrine (NEO-SYNEPHRINE) '20mg'$ /NS 226m premix infusion (0 mcg/min Intravenous Stopped 04/20/21 0100)  docusate sodium (COLACE) capsule 100 mg (has no administration in time range)  polyethylene glycol (MIRALAX / GLYCOLAX) packet 17 g (has no administration in time range)  insulin aspart (novoLOG) injection 0-15 Units (0 Units Subcutaneous Not Given 04/20/21 0050)  propranolol (INDERAL) injection 1 mg (has no administration in time range)  propranolol (INDERAL) tablet 60 mg (has no administration in time range)  propylthiouracil (PTU) tablet 500 mg (has no administration in time range)  propylthiouracil (PTU) tablet 250 mg (has no  administration in time range)  hydrocortisone sodium succinate (SOLU-CORTEF) injection 250 mg (has no administration in time range)  hydrocortisone sodium succinate (SOLU-CORTEF) 100 MG injection 100 mg (has no administration in time range)  Chlorhexidine Gluconate Cloth 2 % PADS 6 each (has no administration in time range)  MEDLINE mouth rinse (has no administration in time range)  hydrocortisone sod succinate (SOLU-CORTEF) injection 300 mg (has no administration in time range)  sodium chloride 0.9 % bolus 500 mL (0 mLs Intravenous Stopped 04/19/21 2143)  aztreonam (AZACTAM) 2 g in sodium chloride 0.9 % 100 mL IVPB (0 g Intravenous Stopped 04/19/21 2143)  metroNIDAZOLE (FLAGYL) IVPB 500 mg (0 mg Intravenous Stopped 04/19/21 2244)  vancomycin (VANCOCIN) IVPB 1000 mg/200 mL premix (0 mg Intravenous Stopped 04/19/21 2344)  acetaminophen (TYLENOL) tablet 650 mg (650 mg Oral Given 04/19/21 2022)  vancomycin (VANCOREADY) IVPB 500 mg/100 mL (0 mg Intravenous Stopped  04/20/21 0109)  fentaNYL (SUBLIMAZE) injection 75 mcg (75 mcg Intravenous Given 04/19/21 2023)  ondansetron (ZOFRAN) injection 4 mg (4 mg Intravenous Given 04/19/21 2023)  iohexol (OMNIPAQUE) 300 MG/ML solution 100 mL (100 mLs Intravenous Contrast Given 04/19/21 2047)  sodium chloride 0.9 % bolus 500 mL (0 mLs Intravenous Stopped 04/19/21 2244)  diltiazem (CARDIZEM) injection 10 mg (10 mg Intravenous Given 04/19/21 2242)  LORazepam (ATIVAN) injection 1 mg (1 mg Intravenous Given 04/20/21 0105)     IMPRESSION / MDM / ASSESSMENT AND PLAN / ED COURSE  I reviewed the triage vital signs and the nursing notes.                               The patient is on the cardiac monitor to evaluate for evidence of arrhythmia and/or significant heart rate changes.   MDM:  61 yo F here with abdominal pain, diarrhea, AFib RVR, and generalized weakness. Pt arrives febrile, tachycardic, and borderline hypotensive. Sepsis protocol initiated given fever, with broad-spectrum  ABX. H/o CHF and pt presents in Afib RVR, so will be cautious with fluid resuscitation. Labs are overall reassuring. No significant lactic acidosis, leukocytosis, AKI, or apparent signs of severe sepsis. CT A/P also overall unremarkable, though she does have signs of likely diarrheal illness.  Pt given cautious fluids, analgesia, and ABX, with no improvement in her AFib RVR. She has a documented h/o long QT as well as intolerance of amio. Will give dilt, but admit to ICU given her borderline pressures, as she may need neo to manage hypotension until rate is controlled. Pt updated and in agreement.   MEDICATIONS GIVEN IN ED: Medications  phenylephrine (NEO-SYNEPHRINE) '20mg'$ /NS 252m premix infusion (0 mcg/min Intravenous Stopped 04/20/21 0100)  docusate sodium (COLACE) capsule 100 mg (has no administration in time range)  polyethylene glycol (MIRALAX / GLYCOLAX) packet 17 g (has no administration in time range)  insulin aspart (novoLOG) injection 0-15 Units (0 Units Subcutaneous Not Given 04/20/21 0050)  propranolol (INDERAL) injection 1 mg (has no administration in time range)  propranolol (INDERAL) tablet 60 mg (has no administration in time range)  propylthiouracil (PTU) tablet 500 mg (has no administration in time range)  propylthiouracil (PTU) tablet 250 mg (has no administration in time range)  hydrocortisone sodium succinate (SOLU-CORTEF) injection 250 mg (has no administration in time range)  hydrocortisone sodium succinate (SOLU-CORTEF) 100 MG injection 100 mg (has no administration in time range)  Chlorhexidine Gluconate Cloth 2 % PADS 6 each (has no administration in time range)  MEDLINE mouth rinse (has no administration in time range)  hydrocortisone sod succinate (SOLU-CORTEF) injection 300 mg (has no administration in time range)  sodium chloride 0.9 % bolus 500 mL (0 mLs Intravenous Stopped 04/19/21 2143)  aztreonam (AZACTAM) 2 g in sodium chloride 0.9 % 100 mL IVPB (0 g Intravenous  Stopped 04/19/21 2143)  metroNIDAZOLE (FLAGYL) IVPB 500 mg (0 mg Intravenous Stopped 04/19/21 2244)  vancomycin (VANCOCIN) IVPB 1000 mg/200 mL premix (0 mg Intravenous Stopped 04/19/21 2344)  acetaminophen (TYLENOL) tablet 650 mg (650 mg Oral Given 04/19/21 2022)  vancomycin (VANCOREADY) IVPB 500 mg/100 mL (0 mg Intravenous Stopped 04/20/21 0109)  fentaNYL (SUBLIMAZE) injection 75 mcg (75 mcg Intravenous Given 04/19/21 2023)  ondansetron (ZOFRAN) injection 4 mg (4 mg Intravenous Given 04/19/21 2023)  iohexol (OMNIPAQUE) 300 MG/ML solution 100 mL (100 mLs Intravenous Contrast Given 04/19/21 2047)  sodium chloride 0.9 % bolus 500 mL (0 mLs Intravenous  Stopped 04/19/21 2244)  diltiazem (CARDIZEM) injection 10 mg (10 mg Intravenous Given 04/19/21 2242)  LORazepam (ATIVAN) injection 1 mg (1 mg Intravenous Given 04/20/21 0105)     Consults:  Intensivist consulted for admission.   EMR reviewed  GI notes from [facility] Endocrinology visit 02/2021 with Mee Hives     FINAL CLINICAL IMPRESSION(S) / ED DIAGNOSES   Final diagnoses:  Sepsis without acute organ dysfunction, due to unspecified organism [facility])  Atrial fibrillation with rapid ventricular response ([place])     Rx / DC Orders   ED Discharge Orders     None        Note:  This document was prepared using Dragon voice recognition software and may include unintentional dictation errors.   Duffy Bruce, MD 04/20/21 [phone]

## 2021-04-19 NOTE — Sepsis Progress Note (Signed)
Elink following code sepsis °

## 2021-04-19 NOTE — ED Triage Notes (Signed)
Pt arrived with spouse POV. Pt began having stomach pain mainly in the RUQ yesterday. Pt states she coughed all night due to allergies and Began having yellow liquid BM all day. Pt had a black liquid stool this afternoon and then her abdominal pain increased. Pt vomited shortly after. Pt has a hx of cirrhosis.  ?

## 2021-04-19 NOTE — ED Notes (Signed)
Dr. Ellender Hose at the bedside ?

## 2021-04-19 NOTE — ED Notes (Addendum)
Critical Care PCCM at the bedside ?

## 2021-04-19 NOTE — Consult Note (Signed)
CODE SEPSIS - PHARMACY COMMUNICATION ? ?**Broad Spectrum Antibiotics should be administered within 1 hour of Sepsis diagnosis** ? ?Time Code Sepsis Called/Page Received: 2000 ? ?Antibiotics Ordered: flagyl, vancomycin, aztreonam ? ?Time of 1st antibiotic administration: 2031 ? ? ? ? ?Darrick Penna ,PharmD ?Clinical Pharmacist  ?04/19/2021  8:05 PM ? ?

## 2021-04-20 ENCOUNTER — Inpatient Hospital Stay (HOSPITAL_COMMUNITY)
Admit: 2021-04-20 | Discharge: 2021-04-20 | Disposition: A | Discharge status: Home / Self Care | Payer: 59 | Attending: Critical Care Medicine | Admitting: Critical Care Medicine

## 2021-04-20 ENCOUNTER — Inpatient Hospital Stay: Payer: 59

## 2021-04-20 DIAGNOSIS — R9431 Abnormal electrocardiogram [ECG] [EKG]: Secondary | ICD-10-CM | POA: Diagnosis not present

## 2021-04-20 DIAGNOSIS — Z720 Tobacco use: Secondary | ICD-10-CM

## 2021-04-20 DIAGNOSIS — F1010 Alcohol abuse, uncomplicated: Secondary | ICD-10-CM

## 2021-04-20 LAB — ECHOCARDIOGRAM COMPLETE
AR max vel: 2.17 cm2
AV Area VTI: 2.72 cm2
AV Area mean vel: 1.97 cm2
AV Mean grad: 2 mmHg
AV Peak grad: 3.4 mmHg
Ao pk vel: 0.92 m/s
Area-P 1/2: 6.12 cm2
Height: 66 in
S' Lateral: 3.7 cm
Weight: 2744.29 oz

## 2021-04-20 LAB — TSH: TSH: <0.01 u[IU]/mL — ABNORMAL LOW (ref 0.350–4.500)

## 2021-04-20 LAB — ETHANOL: Alcohol, Ethyl (B): <10 mg/dL (ref <10)

## 2021-04-20 LAB — CBG MONITORING, ED: Glucose-Capillary: 109 mg/dL — ABNORMAL HIGH (ref 70–99)

## 2021-04-20 LAB — GLUCOSE, CAPILLARY
Glucose-Capillary: 121 mg/dL — ABNORMAL HIGH (ref 70–99)
Glucose-Capillary: 170 mg/dL — ABNORMAL HIGH (ref 70–99)
Glucose-Capillary: 157 mg/dL — ABNORMAL HIGH (ref 70–99)
Glucose-Capillary: 168 mg/dL — ABNORMAL HIGH (ref 70–99)

## 2021-04-20 LAB — TROPONIN I (HIGH SENSITIVITY)
Troponin I (High Sensitivity): 8 ng/L (ref <18)
Troponin I (High Sensitivity): 7 ng/L (ref <18)

## 2021-04-20 LAB — CBC
HCT: 34.3 % — ABNORMAL LOW (ref 36.0–46.0)
Hemoglobin: 11.3 g/dL — ABNORMAL LOW (ref 12.0–15.0)
MCH: 29.3 pg (ref 26.0–34.0)
MCHC: 32.9 g/dL (ref 30.0–36.0)
MCV: 88.9 fL (ref 80.0–100.0)
Platelets: 123 10*3/uL — ABNORMAL LOW (ref 150–400)
RBC: 3.86 MIL/uL — ABNORMAL LOW (ref 3.87–5.11)
RDW: 15 % (ref 11.5–15.5)
WBC: 4.5 10*3/uL (ref 4.0–10.5)
nRBC: 0 % (ref 0.0–0.2)

## 2021-04-20 LAB — BASIC METABOLIC PANEL
Anion gap: 8 (ref 5–15)
BUN: 14 mg/dL (ref 8–23)
CO2: 23 mmol/L (ref 22–32)
Calcium: 8.5 mg/dL — ABNORMAL LOW (ref 8.9–10.3)
Chloride: 106 mmol/L (ref 98–111)
Creatinine, Ser: 0.68 mg/dL (ref 0.44–1.00)
GFR, Estimated: >60 mL/min (ref >60)
Glucose, Bld: 108 mg/dL — ABNORMAL HIGH (ref 70–99)
Potassium: 3.8 mmol/L (ref 3.5–5.1)
Sodium: 137 mmol/L (ref 135–145)

## 2021-04-20 LAB — URINALYSIS, ROUTINE W REFLEX MICROSCOPIC
Bilirubin Urine: NEGATIVE
Glucose, UA: 50 mg/dL — AB
Hgb urine dipstick: NEGATIVE
Ketones, ur: NEGATIVE mg/dL
Leukocytes,Ua: NEGATIVE
Nitrite: NEGATIVE
Protein, ur: NEGATIVE mg/dL
Specific Gravity, Urine: 1.015 (ref 1.005–1.030)
pH: 6 (ref 5.0–8.0)

## 2021-04-20 LAB — URINE DRUG SCREEN, QUALITATIVE (ARMC ONLY)
Amphetamines, Ur Screen: NOT DETECTED
Barbiturates, Ur Screen: NOT DETECTED
Benzodiazepine, Ur Scrn: POSITIVE — AB
Cannabinoid 50 Ng, Ur ~~LOC~~: NOT DETECTED
Cocaine Metabolite,Ur ~~LOC~~: NOT DETECTED
MDMA (Ecstasy)Ur Screen: NOT DETECTED
Methadone Scn, Ur: NOT DETECTED
Opiate, Ur Screen: NOT DETECTED
Phencyclidine (PCP) Ur S: NOT DETECTED
Tricyclic, Ur Screen: NOT DETECTED

## 2021-04-20 LAB — T4, FREE: Free T4: 2.02 ng/dL — ABNORMAL HIGH (ref 0.61–1.12)

## 2021-04-20 LAB — PHOSPHORUS: Phosphorus: 3.8 mg/dL (ref 2.5–4.6)

## 2021-04-20 LAB — LACTIC ACID, PLASMA: Lactic Acid, Venous: 1 mmol/L (ref 0.5–1.9)

## 2021-04-20 LAB — HIV ANTIBODY (ROUTINE TESTING W REFLEX): HIV Screen 4th Generation wRfx: NONREACTIVE

## 2021-04-20 LAB — HEMOGLOBIN A1C
Hgb A1c MFr Bld: 5.4 % (ref 4.8–5.6)
Mean Plasma Glucose: 108 mg/dL

## 2021-04-20 LAB — BRAIN NATRIURETIC PEPTIDE: B Natriuretic Peptide: 423.5 pg/mL — ABNORMAL HIGH (ref 0.0–100.0)

## 2021-04-20 LAB — RESP PANEL BY RT-PCR (FLU A&B, COVID) ARPGX2
Influenza A by PCR: NEGATIVE
Influenza B by PCR: NEGATIVE
SARS Coronavirus 2 by RT PCR: NEGATIVE

## 2021-04-20 LAB — LIPASE, BLOOD: Lipase: 36 U/L (ref 11–51)

## 2021-04-20 LAB — MRSA NEXT GEN BY PCR, NASAL: MRSA by PCR Next Gen: NOT DETECTED

## 2021-04-20 LAB — MAGNESIUM: Magnesium: 1.9 mg/dL (ref 1.7–2.4)

## 2021-04-20 LAB — PROCALCITONIN: Procalcitonin: 0.47 ng/mL

## 2021-04-20 MED ORDER — POTASSIUM CHLORIDE CRYS ER 20 MEQ PO TBCR
20.0000 meq | EXTENDED_RELEASE_TABLET | Freq: Once | ORAL | Status: AC
  Administered 2021-04-20: 20 meq via ORAL
  Filled 2021-04-20: qty 1

## 2021-04-20 MED ORDER — FOLIC ACID 1 MG PO TABS
1.0000 mg | ORAL_TABLET | Freq: Every day | ORAL | Status: DC
  Administered 2021-04-20 – 2021-04-24 (×5): 1 mg via ORAL
  Filled 2021-04-20 (×5): qty 1

## 2021-04-20 MED ORDER — TRAZODONE HCL 50 MG PO TABS
50.0000 mg | ORAL_TABLET | Freq: Every day | ORAL | Status: DC
  Administered 2021-04-20 – 2021-04-23 (×4): 50 mg via ORAL
  Filled 2021-04-20 (×4): qty 1

## 2021-04-20 MED ORDER — ORAL CARE MOUTH RINSE
15.0000 mL | Freq: Two times a day (BID) | OROMUCOSAL | Status: DC
  Administered 2021-04-20 – 2021-04-24 (×8): 15 mL via OROMUCOSAL

## 2021-04-20 MED ORDER — LORAZEPAM 2 MG/ML IJ SOLN
INTRAMUSCULAR | Status: AC
  Administered 2021-04-20: 1 mg via INTRAVENOUS
  Filled 2021-04-20: qty 1

## 2021-04-20 MED ORDER — POTASSIUM IODIDE (EXPECTORANT) 1 GM/ML PO SOLN
5.0000 [drp] | Freq: Four times a day (QID) | ORAL | Status: DC
  Administered 2021-04-20 – 2021-04-22 (×10): 250 mg via ORAL
  Filled 2021-04-20 (×14): qty 0.25

## 2021-04-20 MED ORDER — PROPYLTHIOURACIL 50 MG PO TABS
250.0000 mg | ORAL_TABLET | ORAL | Status: DC
  Administered 2021-04-20 – 2021-04-24 (×25): 250 mg via ORAL
  Filled 2021-04-20 (×26): qty 5

## 2021-04-20 MED ORDER — SUVOREXANT 20 MG PO TABS: 20.0000 mg | ORAL_TABLET | Freq: Every day | ORAL | Status: DC

## 2021-04-20 MED ORDER — ADULT MULTIVITAMIN W/MINERALS CH
1.0000 | ORAL_TABLET | Freq: Every day | ORAL | Status: DC
  Administered 2021-04-20 – 2021-04-24 (×5): 1 via ORAL
  Filled 2021-04-20 (×5): qty 1

## 2021-04-20 MED ORDER — CHLORHEXIDINE GLUCONATE CLOTH 2 % EX PADS
6.0000 | MEDICATED_PAD | Freq: Every day | CUTANEOUS | Status: DC
  Administered 2021-04-20 – 2021-04-22 (×3): 6 via TOPICAL

## 2021-04-20 MED ORDER — COLCHICINE 0.6 MG PO TABS
0.6000 mg | ORAL_TABLET | Freq: Two times a day (BID) | ORAL | Status: DC | PRN
  Filled 2021-04-20: qty 1

## 2021-04-20 MED ORDER — THIAMINE HCL 100 MG PO TABS
100.0000 mg | ORAL_TABLET | Freq: Every day | ORAL | Status: DC
  Administered 2021-04-20 – 2021-04-24 (×5): 100 mg via ORAL
  Filled 2021-04-20 (×5): qty 1

## 2021-04-20 MED ORDER — FAMOTIDINE IN NACL 20-0.9 MG/50ML-% IV SOLN
20.0000 mg | Freq: Two times a day (BID) | INTRAVENOUS | Status: DC
  Administered 2021-04-20 – 2021-04-24 (×9): 20 mg via INTRAVENOUS
  Filled 2021-04-20 (×9): qty 50

## 2021-04-20 MED ORDER — HYDROCORTISONE SOD SUC (PF) 250 MG IJ SOLR
250.0000 mg | Freq: Once | INTRAMUSCULAR | Status: DC
  Filled 2021-04-20: qty 250

## 2021-04-20 MED ORDER — PROPRANOLOL HCL 20 MG PO TABS
60.0000 mg | ORAL_TABLET | ORAL | Status: DC
  Administered 2021-04-20: 60 mg via ORAL
  Filled 2021-04-20: qty 3

## 2021-04-20 MED ORDER — PROPYLTHIOURACIL 50 MG PO TABS: 500.0000 mg | ORAL_TABLET | Freq: Once | ORAL | Status: DC

## 2021-04-20 MED ORDER — HYDROCORTISONE SOD SUC (PF) 100 MG IJ SOLR
100.0000 mg | Freq: Three times a day (TID) | INTRAMUSCULAR | Status: DC
  Administered 2021-04-20 – 2021-04-24 (×13): 100 mg via INTRAVENOUS
  Filled 2021-04-20 (×15): qty 2

## 2021-04-20 MED ORDER — LORAZEPAM 2 MG/ML IJ SOLN: 1.0000 mg | Freq: Once | INTRAMUSCULAR | Status: AC

## 2021-04-20 MED ORDER — PROPYLTHIOURACIL 50 MG PO TABS
500.0000 mg | ORAL_TABLET | Freq: Once | ORAL | Status: AC
  Administered 2021-04-20: 500 mg via ORAL
  Filled 2021-04-20: qty 10

## 2021-04-20 MED ORDER — ALLOPURINOL 100 MG PO TABS
300.0000 mg | ORAL_TABLET | Freq: Every day | ORAL | Status: DC
  Administered 2021-04-21 – 2021-04-24 (×4): 300 mg via ORAL
  Filled 2021-04-20: qty 3
  Filled 2021-04-20 (×3): qty 1

## 2021-04-20 MED ORDER — MAGNESIUM SULFATE 2 GM/50ML IV SOLN
2.0000 g | Freq: Once | INTRAVENOUS | Status: AC
  Administered 2021-04-20: 2 g via INTRAVENOUS
  Filled 2021-04-20: qty 50

## 2021-04-20 MED ORDER — PROPRANOLOL HCL 20 MG PO TABS
60.0000 mg | ORAL_TABLET | ORAL | Status: DC
  Administered 2021-04-20 – 2021-04-22 (×11): 60 mg via ORAL
  Filled 2021-04-20 (×15): qty 3

## 2021-04-20 MED ORDER — PROPRANOLOL HCL 1 MG/ML IV SOLN
1.0000 mg | Freq: Once | INTRAVENOUS | Status: AC
  Administered 2021-04-20: 1 mg via INTRAVENOUS
  Filled 2021-04-20 (×2): qty 1

## 2021-04-20 MED ORDER — PROPYLTHIOURACIL 50 MG PO TABS: 250.0000 mg | ORAL_TABLET | ORAL | Status: DC

## 2021-04-20 MED ORDER — CHOLESTYRAMINE 4 G PO PACK
4.0000 g | PACK | Freq: Two times a day (BID) | ORAL | Status: DC
  Administered 2021-04-20 – 2021-04-23 (×7): 4 g via ORAL
  Filled 2021-04-20 (×9): qty 1

## 2021-04-20 MED ORDER — ZOLPIDEM TARTRATE 5 MG PO TABS
5.0000 mg | ORAL_TABLET | Freq: Every day | ORAL | Status: DC
  Administered 2021-04-20: 5 mg via ORAL
  Filled 2021-04-20: qty 1

## 2021-04-20 MED ORDER — APIXABAN 5 MG PO TABS
5.0000 mg | ORAL_TABLET | Freq: Two times a day (BID) | ORAL | Status: DC
  Administered 2021-04-20 – 2021-04-24 (×9): 5 mg via ORAL
  Filled 2021-04-20 (×9): qty 1

## 2021-04-20 MED ORDER — HYDROCORTISONE SOD SUC (PF) 500 MG IJ SOLR
300.0000 mg | Freq: Once | INTRAMUSCULAR | Status: AC
  Administered 2021-04-20: 300 mg via INTRAVENOUS
  Filled 2021-04-20: qty 2.4

## 2021-04-20 NOTE — Progress Notes (Signed)
PHARMACY CONSULT NOTE ? ?Pharmacy Consult for Electrolyte Monitoring and Replacement  ? ?Recent Labs: ?Potassium (mmol/L)  ?Date Value  ?04/20/2021 3.8  ? ?Magnesium (mg/dL)  ?Date Value  ?04/20/2021 1.9  ? ?Calcium (mg/dL)  ?Date Value  ?04/20/2021 8.5 (L)  ? ?Albumin (g/dL)  ?Date Value  ?04/19/2021 4.0  ? ?Phosphorus (mg/dL)  ?Date Value  ?04/20/2021 3.8  ? ?Sodium (mmol/L)  ?Date Value  ?04/20/2021 137  ?10/13/2018 141  ? ? ?Assessment: 61 y.o female with significant PMH of mixed ischemic and nonischemic ischemic cardiomyopathy, CAD, alcoholic cirrhosis with ascites, hyperlipidemia, COPD, CKD stage III, combined systolic and diastolic CHF persistent atrial fibrillation on Eliquis, GERD, bipolar disorder, anxiety and depression, RLS, type 2 diabetes mellitus, thyroid disorder, tobacco abuse, EtOH abuse who presented to the ED with chief complaints of diarrhea, nausea, vomiting, abdominal pain, fever x 3 days.  ? ?Goal of Therapy:  ?Potassium 4.0 - 5.1 mmol/L ?Magnesium 2.0 - 2.4 mg/dL ?All Other Electrolytes WNL ? ?Plan:  ?20 mEq oral KCl x 1 ?2 grams IV magnesium sulfate x 1 (completed) ?Recheck electrolytes in am ? ?Dallie Piles ,PharmD ?Clinical Pharmacist ?04/20/2021 7:05 AM ? ?

## 2021-04-20 NOTE — ED Notes (Signed)
Pt transported to ICU via stretcher on monitor with RN. Critical at transport ?

## 2021-04-20 NOTE — Progress Notes (Signed)
*  PRELIMINARY RESULTS* ?Echocardiogram ?2D Echocardiogram has been performed. ? ?Sheryle Vice, Sonia Side ?04/20/2021, 2:09 PM ?

## 2021-04-20 NOTE — Progress Notes (Addendum)
eLink Physician-Brief Progress Note ?Patient Name: Ariana Riggs ?DOB: Sep 20, 1960 ?MRN: [id number] ? ? ?Date of Service ? 04/20/2021  ?HPI/Events of Note ? 62 year old woman in ICU with fever, A fib with RVR, anxiety, diarrhea, belly cramps. CT without significant findings to explain sepsis. LA normal. Given some fluids and labs also suggested thyrotoxicosis for which the bedside team is starting therapy. On camera she has HR 122, MAP is 74, o2 sat is 98 and she is awake and eating ice chips from a cup.   ?eICU Interventions ? ER notee reviewed, PCCM admitting  ?Has chronic A fib on eliquis  ?Given multiple antibiotics, unclear if septic at all as thyroid illness can explain all the above  ?Follow cultures and de escalate antibiotics ?Consider Endo evaluation for her thyroid disease in AM ?Resume eliquis if no urgent contraindications ?Call E link if needed. PCCM note is pending at this time.   ? ? ? ?Intervention Category ?Major Interventions: Arrhythmia - evaluation and management ?Evaluation Type: New Patient Evaluation ? ?Aveion Nguyen G Keontae Levingston ?04/20/2021, 2:17 AM ? ?Addendum at 3:45 am ?D/w ICU NP, note reviewed in chart. Normal WBC, lactate, benign belly exam, nothing infectious appearing on the CT so agree with holding further Abx for now. Follow cultures. Send stool studies.  If hypotensive then we will cover for GI organisms. She surely has underlying thyroid disease even from levels in the past (though TSH was always high then) so this needs further investigating.  ?

## 2021-04-20 NOTE — Plan of Care (Signed)
Care plan reviewed with patient.

## 2021-04-20 NOTE — Sepsis Progress Note (Signed)
Notified provider, Issacs, MD, of need to order more fluid per sepsis protocol based off of patients weight. Was informed that the patient has a history of CHF, and "will be very cautious with fluids". Will continue to monitor code sepsis.  ?

## 2021-04-20 NOTE — ED Notes (Signed)
When going into the room to medicate pt with Ativan, she is sitting straight up in the bed, restless, stating she cannot breathe. O2/2L applied and Ativan administered. After a few minutes, pt has calmed down and states she feels like she can "breathe better now". ?
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
## 2021-04-21 ENCOUNTER — Other Ambulatory Visit: Payer: Self-pay

## 2021-04-21 DIAGNOSIS — E0591 Thyrotoxicosis, unspecified with thyrotoxic crisis or storm: Secondary | ICD-10-CM | POA: Diagnosis present

## 2021-04-21 LAB — CBC WITH DIFFERENTIAL/PLATELET
Abs Immature Granulocytes: 0.01 10*3/uL (ref 0.00–0.07)
Basophils Absolute: 0 10*3/uL (ref 0.0–0.1)
Basophils Relative: 0 %
Eosinophils Absolute: 0 10*3/uL (ref 0.0–0.5)
Eosinophils Relative: 0 %
HCT: 36.1 % (ref 36.0–46.0)
Hemoglobin: 11.8 g/dL — ABNORMAL LOW (ref 12.0–15.0)
Immature Granulocytes: 0 %
Lymphocytes Relative: 17 %
Lymphs Abs: 0.8 10*3/uL (ref 0.7–4.0)
MCH: 28.5 pg (ref 26.0–34.0)
MCHC: 32.7 g/dL (ref 30.0–36.0)
MCV: 87.2 fL (ref 80.0–100.0)
Monocytes Absolute: 0.3 10*3/uL (ref 0.1–1.0)
Monocytes Relative: 7 %
Neutro Abs: 3.7 10*3/uL (ref 1.7–7.7)
Neutrophils Relative %: 76 %
Platelets: 120 10*3/uL — ABNORMAL LOW (ref 150–400)
RBC: 4.14 MIL/uL (ref 3.87–5.11)
RDW: 14.9 % (ref 11.5–15.5)
WBC: 4.9 10*3/uL (ref 4.0–10.5)
nRBC: 0 % (ref 0.0–0.2)

## 2021-04-21 LAB — BASIC METABOLIC PANEL
Anion gap: 10 (ref 5–15)
BUN: 21 mg/dL (ref 8–23)
CO2: 21 mmol/L — ABNORMAL LOW (ref 22–32)
Calcium: 8.9 mg/dL (ref 8.9–10.3)
Chloride: 106 mmol/L (ref 98–111)
Creatinine, Ser: 0.69 mg/dL (ref 0.44–1.00)
GFR, Estimated: >60 mL/min (ref >60)
Glucose, Bld: 148 mg/dL — ABNORMAL HIGH (ref 70–99)
Potassium: 4.2 mmol/L (ref 3.5–5.1)
Sodium: 137 mmol/L (ref 135–145)

## 2021-04-21 LAB — GLUCOSE, CAPILLARY
Glucose-Capillary: 139 mg/dL — ABNORMAL HIGH (ref 70–99)
Glucose-Capillary: 142 mg/dL — ABNORMAL HIGH (ref 70–99)
Glucose-Capillary: 140 mg/dL — ABNORMAL HIGH (ref 70–99)
Glucose-Capillary: 135 mg/dL — ABNORMAL HIGH (ref 70–99)
Glucose-Capillary: 131 mg/dL — ABNORMAL HIGH (ref 70–99)
Glucose-Capillary: 130 mg/dL — ABNORMAL HIGH (ref 70–99)
Glucose-Capillary: 121 mg/dL — ABNORMAL HIGH (ref 70–99)

## 2021-04-21 LAB — PHOSPHORUS: Phosphorus: 3.7 mg/dL (ref 2.5–4.6)

## 2021-04-21 LAB — MAGNESIUM: Magnesium: 2.6 mg/dL — ABNORMAL HIGH (ref 1.7–2.4)

## 2021-04-21 LAB — PROCALCITONIN: Procalcitonin: 0.82 ng/mL

## 2021-04-21 LAB — AFP TUMOR MARKER: AFP, Serum, Tumor Marker: <1.8 ng/mL (ref 0.0–9.2)

## 2021-04-21 MED ORDER — LORAZEPAM 2 MG/ML IJ SOLN: 1.0000 mg | INTRAMUSCULAR | Status: DC | PRN

## 2021-04-21 MED ORDER — PHENOBARBITAL 32.4 MG PO TABS: 32.4000 mg | ORAL_TABLET | Freq: Three times a day (TID) | ORAL | Status: DC

## 2021-04-21 MED ORDER — SODIUM CHLORIDE 0.9 % IV SOLN: INTRAVENOUS | Status: DC | PRN

## 2021-04-21 MED ORDER — PHENOBARBITAL 32.4 MG PO TABS
97.2000 mg | ORAL_TABLET | Freq: Three times a day (TID) | ORAL | Status: AC
  Administered 2021-04-21 – 2021-04-23 (×6): 97.2 mg via ORAL
  Filled 2021-04-21 (×6): qty 3

## 2021-04-21 MED ORDER — PHENOBARBITAL 32.4 MG PO TABS
64.8000 mg | ORAL_TABLET | Freq: Three times a day (TID) | ORAL | Status: DC
  Administered 2021-04-23 – 2021-04-24 (×4): 64.8 mg via ORAL
  Filled 2021-04-21: qty 1
  Filled 2021-04-21 (×5): qty 2

## 2021-04-21 MED ORDER — DIGOXIN 250 MCG PO TABS
0.2500 mg | ORAL_TABLET | Freq: Once | ORAL | Status: AC
  Administered 2021-04-21: 0.25 mg via ORAL
  Filled 2021-04-21: qty 1

## 2021-04-21 MED ORDER — SUVOREXANT 20 MG PO TABS
20.0000 mg | ORAL_TABLET | Freq: Every day | ORAL | Status: DC
  Administered 2021-04-21 – 2021-04-23 (×3): 20 mg via ORAL
  Filled 2021-04-21 (×5): qty 1

## 2021-04-21 MED ORDER — DIGOXIN 125 MCG PO TABS
0.1250 mg | ORAL_TABLET | Freq: Every day | ORAL | Status: DC
  Administered 2021-04-22 – 2021-04-24 (×3): 0.125 mg via ORAL
  Filled 2021-04-21 (×3): qty 1

## 2021-04-21 MED ORDER — ORAL CARE MOUTH RINSE
15.0000 mL | Freq: Two times a day (BID) | OROMUCOSAL | Status: DC
  Administered 2021-04-21: 15 mL via OROMUCOSAL

## 2021-04-21 MED ORDER — CITALOPRAM HYDROBROMIDE 20 MG PO TABS
20.0000 mg | ORAL_TABLET | Freq: Every day | ORAL | Status: DC
  Administered 2021-04-21 – 2021-04-24 (×4): 20 mg via ORAL
  Filled 2021-04-21 (×4): qty 1

## 2021-04-21 NOTE — Plan of Care (Signed)

## 2021-04-21 NOTE — Progress Notes (Signed)
PHARMACY CONSULT NOTE ? ?Pharmacy Consult for Electrolyte Monitoring and Replacement  ? ?Recent Labs: ?Potassium (mmol/L)  ?Date Value  ?04/21/2021 4.2  ? ?Magnesium (mg/dL)  ?Date Value  ?04/21/2021 2.6 (H)  ? ?Calcium (mg/dL)  ?Date Value  ?04/21/2021 8.9  ? ?Albumin (g/dL)  ?Date Value  ?04/19/2021 4.0  ? ?Phosphorus (mg/dL)  ?Date Value  ?04/21/2021 3.7  ? ?Sodium (mmol/L)  ?Date Value  ?04/21/2021 137  ?10/13/2018 141  ? ? ?Assessment: 61 y.o female with significant PMH of mixed ischemic and nonischemic ischemic cardiomyopathy, CAD, alcoholic cirrhosis with ascites, hyperlipidemia, COPD, CKD stage III, combined systolic and diastolic CHF persistent atrial fibrillation on Eliquis, GERD, bipolar disorder, anxiety and depression, RLS, type 2 diabetes mellitus, thyroid disorder, tobacco abuse, EtOH abuse who presented to the ED with chief complaints of diarrhea, nausea, vomiting, abdominal pain, fever x 3 days.  ? ?Goal of Therapy:  ?Potassium 4.0 - 5.1 mmol/L ?Magnesium 2.0 - 2.4 mg/dL ?All Other Electrolytes WNL ? ?Plan:  ?K 3.8>4.2 after 20 mEq oral KCl x1 (yesterday) ?No further repletion today ?Mg 1.9>2.6 after 2 grams IV magnesium sulfate x 1 (yesterday) ?No further repletion today. ?Recheck electrolytes in am ? ?Lorna Dibble ,PharmD ?Clinical Pharmacist ?04/21/2021 8:32 AM ? ?

## 2021-04-21 NOTE — Progress Notes (Signed)
Patient found standing at the room door, removed herself from monitoring equipment and very agitated. Assisted patient back to bed and scheduled phenobarbital given. Bed exit alarm armed. Will continue to monitor.  ?

## 2021-04-21 NOTE — Progress Notes (Signed)
Patient BP 88/62 at this time. Resting quietly. Propanolol not given at this time. Will continue to monitor.  ?

## 2021-04-21 NOTE — Progress Notes (Signed)
Progress Note  Patient Name: Ariana Riggs Date of Encounter: 04/21/2021  [facility] Cardiologist: Nelva Bush, MD   Subjective   Patient is overall feeling better. Still in afib with elevated rates.   Inpatient Medications    Scheduled Meds:  allopurinol  300 mg Oral Daily   apixaban  5 mg Oral BID   Chlorhexidine Gluconate Cloth  6 each Topical Daily   cholestyramine  4 g Oral BID   citalopram  20 mg Oral Daily   folic acid  1 mg Oral Daily   hydrocortisone sod succinate (SOLU-CORTEF) inj  100 mg Intravenous Q8H   insulin aspart  0-15 Units Subcutaneous Q4H   mouth rinse  15 mL Mouth Rinse BID   mouth rinse  15 mL Mouth Rinse BID   multivitamin with minerals  1 tablet Oral Daily   phenobarbital  97.2 mg Oral Q8H   Followed by   [START ON 04/23/2021] phenobarbital  64.8 mg Oral Q8H   Followed by   [START ON 04/25/2021] phenobarbital  32.4 mg Oral Q8H   potassium iodide  5 drop Oral Q6H   propranolol  60 mg Oral Q4H   propylthiouracil  250 mg Oral Q4H   thiamine  100 mg Oral Daily   traZODone  50 mg Oral QHS   Continuous Infusions:  sodium chloride Stopped (04/21/21 1010)   famotidine (PEPCID) IV 20 mg (04/21/21 0913)   PRN Meds: sodium chloride, colchicine, docusate sodium, LORazepam, polyethylene glycol   Vital Signs    Vitals:   04/21/21 1012 04/21/21 1040 04/21/21 1200 04/21/21 1204  BP: 111/74 110/80  120/87  Pulse: (!) 118   (!) 108  Resp: (!) '21 19  18  '$ Temp:   98 F (36.7 C) 98 F (36.7 C)  TempSrc:   Oral Oral  SpO2:      Weight:      Height:        Intake/Output Summary (Last 24 hours) at 04/21/2021 1430 Last data filed at 04/21/2021 1312 Gross per 24 hour  Intake 1563.99 ml  Output 2800 ml  Net -1236.01 ml   Last 3 Weights 04/21/2021 04/20/2021 04/19/2021  Weight (lbs) 163 lb 9.3 oz 171 lb 8.3 oz 165 lb  Weight (kg) 74.2 kg 77.8 kg 74.844 kg      Telemetry    Afib HR 100-120bpm - Personally Reviewed  ECG    Afib, 122bpm, IVCD -  Personally Reviewed  Physical Exam   GEN: No acute distress.   Neck: No JVD Cardiac: Irreg Irreg, no murmurs, rubs, or gallops.  Respiratory: Clear to auscultation bilaterally. GI: Soft, nontender, non-distended  MS: No edema; No deformity. Neuro:  Nonfocal  Psych: Normal affect   Labs    High Sensitivity Troponin:   Recent Labs  Lab 04/20/21 0211 04/20/21 0849  TROPONINIHS 8 7     Chemistry Recent Labs  Lab 04/19/21 1941 04/20/21 0211 04/21/21 0440  NA 139 137 137  K 4.1 3.8 4.2  CL 105 106 106  CO2 24 23 21*  GLUCOSE 100* 108* 148*  BUN '14 14 21  '$ CREATININE 0.71 0.68 0.69  CALCIUM 9.1 8.5* 8.9  MG  --  1.9 2.6*  PROT 7.5  --   --   ALBUMIN 4.0  --   --   AST 21  --   --   ALT 15  --   --   ALKPHOS 128*  --   --   BILITOT 1.2  --   --  GFRNONAA >60 >60 >60  ANIONGAP '10 8 10    '$ Lipids No results for input(s): CHOL, TRIG, HDL, LABVLDL, LDLCALC, CHOLHDL in the last 168 hours.  Hematology Recent Labs  Lab 04/19/21 1941 04/20/21 0211 04/21/21 0440  WBC 4.3 4.5 4.9  RBC 4.27 3.86* 4.14  HGB 12.3 11.3* 11.8*  HCT 37.0 34.3* 36.1  MCV 86.7 88.9 87.2  MCH 28.8 29.3 28.5  MCHC 33.2 32.9 32.7  RDW 14.9 15.0 14.9  PLT 120* 123* 120*   Thyroid  Recent Labs  Lab 04/19/21 1941  TSH <0.010*  FREET4 2.02*    BNP Recent Labs  Lab 04/19/21 1941  BNP 423.5*    DDimer No results for input(s): DDIMER in the last 168 hours.   Radiology    CT ABDOMEN PELVIS W CONTRAST  Result Date: 04/19/2021 CLINICAL DATA:  Acute abdominal pain on the right, initial encounter EXAM: CT ABDOMEN AND PELVIS WITH CONTRAST TECHNIQUE: Multidetector CT imaging of the abdomen and pelvis was performed using the standard protocol following bolus administration of intravenous contrast. RADIATION DOSE REDUCTION: This exam was performed according to the departmental dose-optimization program which includes automated exposure control, adjustment of the mA and/or kV according to patient  size and/or use of iterative reconstruction technique. CONTRAST:  124m OMNIPAQUE IOHEXOL 300 MG/ML  SOLN COMPARISON:  Ultrasound from 03/16/2021 FINDINGS: Lower chest: No acute abnormality. Hepatobiliary: Gallbladder has been surgically removed. Liver demonstrates some heterogeneity without discrete mass. Prominence of the caudate lobe is noted as well consistent with cirrhotic change. This is likely the etiology of the heterogeneity. Pancreas: Unremarkable. No pancreatic ductal dilatation or surrounding inflammatory changes. Spleen: Spleen is prominent and stable from prior exams. Adrenals/Urinary Tract: Adrenal glands are within normal limits. Kidneys are well visualize within normal enhancement pattern. No renal mass is seen. No calculi or obstructive changes are noted. Ureters are within normal limits. Bladder is partially distended. Stomach/Bowel: No obstructive or inflammatory changes of the colon are seen. Mild diverticular changes noted. Fluid is noted within the colon and small bowel which may be related to an underlying diarrheal state. No inflammatory changes are noted. The stomach is within normal limits. Vascular/Lymphatic: Aortic atherosclerosis. No enlarged abdominal or pelvic lymph nodes. Reproductive: Uterus and bilateral adnexa are unremarkable. Other: Mild ascites is noted likely related to the underlying cirrhosis. Musculoskeletal: Degenerative changes of lumbar spine are noted. IMPRESSION: Changes consistent with cirrhosis of the liver with associated splenomegaly and mild ascites. The cirrhosis likely contributes to the heterogeneity of the liver. No discrete mass is noted. Fluid within the colon and small bowel which may be related to a diarrheal state. Clinical correlation is recommended. No other focal abnormality is noted. Electronically Signed   By: MInez CatalinaM.D.   On: 04/19/2021 21:10   DG Chest Port 1 View  Result Date: 04/19/2021 CLINICAL DATA:  Right-sided chest and abdominal  pain, initial encounter EXAM: PORTABLE CHEST 1 VIEW COMPARISON:  02/16/2021 FINDINGS: Cardiac shadow is mildly prominent but stable. Postsurgical changes are again seen. The lungs are well aerated bilaterally. No acute bony abnormality is noted. IMPRESSION: No active disease. Electronically Signed   By: MInez CatalinaM.D.   On: 04/19/2021 19:52   ECHOCARDIOGRAM COMPLETE  Result Date: 04/20/2021    ECHOCARDIOGRAM REPORT   Patient Name:   CNOHELY WHITEHORNDate of Exam: 04/20/2021 Medical Rec #:  [id number]         Height:       66.0 in Accession #:  [id number]         Weight:       171.5 lb Date of Birth:  1960-11-13          BSA:          1.873 m Patient Age:    76 years           BP:           91/69 mmHg Patient Gender: F                  HR:           115 bpm. Exam Location:  [facility] Procedure: 2D Echo, Cardiac Doppler and Color Doppler Indications:     Abnormal ECG R94.31  History:         Patient has prior history of Echocardiogram examinations, most                  recent 06/16/2019. CAD, COPD; Risk Factors:Diabetes.  Sonographer:     Sherrie Sport Referring Phys:  [id number] Teressa Lower Diagnosing Phys: Kathlyn Sacramento MD IMPRESSIONS  1. Left ventricular ejection fraction, by estimation, is 35 to 40%. The left ventricle has moderately decreased function. The left ventricle demonstrates global hypokinesis. The left ventricular internal cavity size was mildly dilated. Left ventricular diastolic parameters are indeterminate.  2. Right ventricular systolic function is normal. The right ventricular size is mildly enlarged. There is normal pulmonary artery systolic pressure.  3. Left atrial size was severely dilated.  4. Right atrial size was moderately dilated.  5. The mitral valve is normal in structure. Moderate mitral valve regurgitation. No evidence of mitral stenosis.  6. Tricuspid valve regurgitation is moderate to severe.  7. The aortic valve is normal in structure. Aortic valve regurgitation is not visualized.  Aortic valve sclerosis/calcification is present, without any evidence of aortic stenosis. FINDINGS  Left Ventricle: Left ventricular ejection fraction, by estimation, is 35 to 40%. The left ventricle has moderately decreased function. The left ventricle demonstrates global hypokinesis. The left ventricular internal cavity size was mildly dilated. There is no left ventricular hypertrophy. Left ventricular diastolic parameters are indeterminate. Right Ventricle: The right ventricular size is mildly enlarged. No increase in right ventricular wall thickness. Right ventricular systolic function is normal. There is normal pulmonary artery systolic pressure. The tricuspid regurgitant velocity is 2.71  m/s, and with an assumed right atrial pressure of 5 mmHg, the estimated right ventricular systolic pressure is 90.2 mmHg. Left Atrium: Left atrial size was severely dilated. Right Atrium: Right atrial size was moderately dilated. Pericardium: There is no evidence of pericardial effusion. Mitral Valve: The mitral valve is normal in structure. Moderate mitral valve regurgitation, with posteriorly-directed jet. No evidence of mitral valve stenosis. Tricuspid Valve: The tricuspid valve is normal in structure. Tricuspid valve regurgitation is moderate to severe. No evidence of tricuspid stenosis. Aortic Valve: The aortic valve is normal in structure. Aortic valve regurgitation is not visualized. Aortic valve sclerosis/calcification is present, without any evidence of aortic stenosis. Aortic valve mean gradient measures 2.0 mmHg. Aortic valve peak  gradient measures 3.4 mmHg. Aortic valve area, by VTI measures 2.72 cm. Pulmonic Valve: The pulmonic valve was normal in structure. Pulmonic valve regurgitation is not visualized. No evidence of pulmonic stenosis. Aorta: The aortic root is normal in size and structure. Venous: The inferior vena cava was not well visualized. IAS/Shunts: No atrial level shunt detected by color flow  Doppler.  LEFT VENTRICLE PLAX 2D LVIDd:  5.10 cm LVIDs:         3.70 cm LV PW:         1.20 cm LV IVS:        0.85 cm LVOT diam:     2.00 cm LV SV:         37 LV SV Index:   20 LVOT Area:     3.14 cm  RIGHT VENTRICLE RV Basal diam:  4.60 cm RV S prime:     9.03 cm/s TAPSE (M-mode): 1.2 cm LEFT ATRIUM              Index        RIGHT ATRIUM           Index LA diam:        6.10 cm  3.26 cm/m   RA Area:     23.70 cm LA Vol (A2C):   113.0 ml 60.32 ml/m  RA Volume:   78.30 ml  41.80 ml/m LA Vol (A4C):   91.0 ml  48.57 ml/m LA Biplane Vol: 101.0 ml 53.91 ml/m  AORTIC VALVE                    PULMONIC VALVE AV Area (Vmax):    2.17 cm     PV Vmax:        0.61 m/s AV Area (Vmean):   1.97 cm     PV Vmean:       39.850 cm/s AV Area (VTI):     2.72 cm     PV VTI:         0.108 m AV Vmax:           91.80 cm/s   PV Peak grad:   1.5 mmHg AV Vmean:          68.500 cm/s  PV Mean grad:   1.0 mmHg AV VTI:            0.135 m      RVOT Peak grad: 2 mmHg AV Peak Grad:      3.4 mmHg AV Mean Grad:      2.0 mmHg LVOT Vmax:         63.30 cm/s LVOT Vmean:        42.900 cm/s LVOT VTI:          0.117 m LVOT/AV VTI ratio: 0.87  AORTA Ao Root diam: 3.10 cm MITRAL VALVE                TRICUSPID VALVE MV Area (PHT): 6.12 cm     TR Peak grad:   29.4 mmHg MV Decel Time: 124 msec     TR Vmax:        271.00 cm/s MV E velocity: 129.00 cm/s                             SHUNTS                             Systemic VTI:  0.12 m                             Systemic Diam: 2.00 cm                             Pulmonic VTI:  0.109 m Kathlyn Sacramento MD Electronically signed by Kathlyn Sacramento MD Signature Date/Time: 04/20/2021/5:56:08 PM    Final    US Abdomen Limited RUQ (LIVER/GB)  Result Date: 04/20/2021 CLINICAL DATA:  Right upper quadrant pain EXAM: ULTRASOUND ABDOMEN LIMITED RIGHT UPPER QUADRANT COMPARISON:  CT from the previous day. FINDINGS: Gallbladder: Surgically removed Common bile duct: Diameter: 4.9 mm Liver: Increased in echogenicity  consistent with the given clinical history of cirrhosis. Mild nodularity is noted. No focal mass is noted. Portal vein is patent on color Doppler imaging with normal direction of blood flow towards the liver. Other: Mild ascites IMPRESSION: Changes of cirrhosis with mild ascites. Status post cholecystectomy. Electronically Signed   By: Inez Catalina M.D.   On: 04/20/2021 03:14    Cardiac Studies   Echo 04/20/21  1. Left ventricular ejection fraction, by estimation, is 35 to 40%. The  left ventricle has moderately decreased function. The left ventricle  demonstrates global hypokinesis. The left ventricular internal cavity size  was mildly dilated. Left ventricular  diastolic parameters are indeterminate.   2. Right ventricular systolic function is normal. The right ventricular  size is mildly enlarged. There is normal pulmonary artery systolic  pressure.   3. Left atrial size was severely dilated.   4. Right atrial size was moderately dilated.   5. The mitral valve is normal in structure. Moderate mitral valve  regurgitation. No evidence of mitral stenosis.   6. Tricuspid valve regurgitation is moderate to severe.   7. The aortic valve is normal in structure. Aortic valve regurgitation is  not visualized. Aortic valve sclerosis/calcification is present, without  any evidence of aortic stenosis.   Cardiac cath 11/2020 Conclusion  Conclusion: Nonobstructive coronary artery disease with 50-60% proximal LCx stenosis that is not hemodynamically significant (iFR 1.0) and mild plaquing in the proximal RCA. Widely patent mid/distal LCx stent. Mildly reduced left ventricular systolic function (EF 48-54%) with global hypokinesis and mildly elevated filling pressure (LVEDP 15-20 mmHg).   Recommendations: Continue secondary prevention of coronary artery disease. If no evidence of bleeding or vascular injury at right radial arteriotomy site, anticipate restarting apixaban 5 mg twice daily tomorrow  morning.  Defer aspirin in the setting of long-term apixaban use. Escalate goal-directed medical therapy for treatment of nonischemic cardiomyopathy, as tolerated. Consider placement of ambulatory cardiac monitor at follow-up visit to assess for paroxysmal atrial fibrillation leading to intermittent chest pain.   Nelva Bush, MD [facility]    Patient Profile     61 y.o. female with a hx of mixed ischemic and nonischemic cardiomyopathy, heart failure with improved ejection fraction, CAD status post circumflex stenting in May 2021, polysubstance abuse, abdominal ascites, hyperlipidemia, tobacco abuse, COPD, depression, GERD, bipolar disorder, persistent A-fib on amiodarone and Eliquis who is being seen 04/20/2021 for the evaluation of afib   Assessment & Plan   Afib RVR - in the setting of Thyroid storm - amiodarone stopped December 2022 for possible contribution to hyperthyroidism - started on IV dilt, however Bps could not tolerate, needing pressor support. Dilt was stopped - PTA metoprolol '50mg'$  daily>>held for hypotension, now on propranolol '60mg'$  Q4H - Echo showed reduced LVEF 35-40% - Eliquis held for concern for dark stools, however Hgb 11.3 on admission and Eliquis was restarted. - BP still low with elevated rates. Continue rate control. Suspect improvement of rates as thyroid is treated. If she doesn't convert may need cardioversion.  - difficult situation for rhythm control options, CAD limiting options. 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  - No anginal symptoms reported - PTA BB, Arb, and statin held>>restart as able - No ASA with Eliquis   Alcohol use Cirrhosis with mild ascites - reports she quit drinking 2 weeks ago   Tobacco use - smoking 2 packs per week - cessation advised  For questions or updates, please contact [facility] Please consult [email or web address] for contact info under        Signed, Harvin Konicek Ninfa Meeker, PA-C  04/21/2021, 2:30 PM

## 2021-04-21 NOTE — Plan of Care (Incomplete Revision)
Patient alert and oriented today, but forgetful. She states was confused during the night and attributes this to having received Ambien last night. Attempts OOB without assistance, bed alarm in use. Tolerates activity well, but is tremulous in hands and legs and also unsteady on her feet. Husband at bedside. Pt reports that she stopped drinking 2 weeks ago abruptly at home, and experienced no withdrawal symptoms. She was not hospitalized, and denies ever having experienced DT's. Prior to stopping drinking states she was consuming 24 beers per week. States used crack cocaine for 10 years but stopped in 2010. Discussed with patient and husband that there are medications are available to her for anxiety or alcohol withdrawal if she should need it while in hospital. Celexa to be restarted pt very appreciative. Also informed that she can have a nicotine patch if she wants that as well, but she declines. Sat 92-94 on 2 liter O2. Breath sounds with exp wheezes; holding off on bronchodilators with elevated HR. HR 120's on propranolol. Potassium iodide and propylthiouricil also being given. Ate well at breakfast.  ? ?Problem: Education: ?Goal: Knowledge of General Education information will improve ?Description: Including pain rating scale, medication(s)/side effects and non-pharmacologic comfort measures ?Outcome: Progressing ?  ?Problem: Health Behavior/Discharge Planning: ?Goal: Ability to manage health-related needs will improve ?Outcome: Progressing ?  ?Problem: Clinical Measurements: ?Goal: Ability to maintain clinical measurements within normal limits will improve ?Outcome: Progressing ?Goal: Will remain free from infection ?Outcome: Progressing ?Goal: Diagnostic test results will improve ?Outcome: Progressing ?Goal: Respiratory complications will improve ?Outcome: Progressing ?Goal: Cardiovascular complication will be avoided ?Outcome: Progressing ?  ?Problem: Activity: ?Goal: Risk for activity intolerance will  decrease ?Outcome: Progressing ?  ?Problem: Coping: ?Goal: Level of anxiety will decrease ?Outcome: Progressing ?  ?Problem: Pain Managment: ?Goal: General experience of comfort will improve ?Outcome: Progressing ?  ?Problem: Safety: ?Goal: Ability to remain free from injury will improve ?Outcome: Progressing ?  ?Problem: Skin Integrity: ?Goal: Risk for impaired skin integrity will decrease ?Outcome: Progressing ?  ?
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
## 2021-04-22 DIAGNOSIS — I5020 Unspecified systolic (congestive) heart failure: Secondary | ICD-10-CM | POA: Diagnosis present

## 2021-04-22 LAB — GLUCOSE, CAPILLARY
Glucose-Capillary: 105 mg/dL — ABNORMAL HIGH (ref 70–99)
Glucose-Capillary: 109 mg/dL — ABNORMAL HIGH (ref 70–99)
Glucose-Capillary: 126 mg/dL — ABNORMAL HIGH (ref 70–99)
Glucose-Capillary: 130 mg/dL — ABNORMAL HIGH (ref 70–99)
Glucose-Capillary: 138 mg/dL — ABNORMAL HIGH (ref 70–99)
Glucose-Capillary: 157 mg/dL — ABNORMAL HIGH (ref 70–99)

## 2021-04-22 LAB — BASIC METABOLIC PANEL
Anion gap: 9 (ref 5–15)
BUN: 29 mg/dL — ABNORMAL HIGH (ref 8–23)
CO2: 20 mmol/L — ABNORMAL LOW (ref 22–32)
Calcium: 8.9 mg/dL (ref 8.9–10.3)
Chloride: 109 mmol/L (ref 98–111)
Creatinine, Ser: 0.9 mg/dL (ref 0.44–1.00)
GFR, Estimated: >60 mL/min (ref >60)
Glucose, Bld: 126 mg/dL — ABNORMAL HIGH (ref 70–99)
Potassium: 4.3 mmol/L (ref 3.5–5.1)
Sodium: 138 mmol/L (ref 135–145)

## 2021-04-22 LAB — NOROVIRUS GROUP 1 & 2 BY PCR, STOOL
Norovirus 1 by PCR: NEGATIVE
Norovirus 2  by PCR: POSITIVE — AB

## 2021-04-22 LAB — CBC
HCT: 36 % (ref 36.0–46.0)
Hemoglobin: 11.9 g/dL — ABNORMAL LOW (ref 12.0–15.0)
MCH: 28.9 pg (ref 26.0–34.0)
MCHC: 33.1 g/dL (ref 30.0–36.0)
MCV: 87.4 fL (ref 80.0–100.0)
Platelets: 120 10*3/uL — ABNORMAL LOW (ref 150–400)
RBC: 4.12 MIL/uL (ref 3.87–5.11)
RDW: 15.2 % (ref 11.5–15.5)
WBC: 6.3 10*3/uL (ref 4.0–10.5)
nRBC: 0 % (ref 0.0–0.2)

## 2021-04-22 LAB — PHOSPHORUS: Phosphorus: 4.1 mg/dL (ref 2.5–4.6)

## 2021-04-22 LAB — MAGNESIUM: Magnesium: 2.6 mg/dL — ABNORMAL HIGH (ref 1.7–2.4)

## 2021-04-22 MED ORDER — PROPRANOLOL HCL 20 MG PO TABS
80.0000 mg | ORAL_TABLET | ORAL | Status: DC
  Administered 2021-04-22 – 2021-04-23 (×7): 80 mg via ORAL
  Filled 2021-04-22 (×3): qty 4

## 2021-04-22 MED ORDER — ATORVASTATIN CALCIUM 10 MG PO TABS
10.0000 mg | ORAL_TABLET | Freq: Every day | ORAL | Status: DC
  Administered 2021-04-22 – 2021-04-24 (×3): 10 mg via ORAL
  Filled 2021-04-22 (×3): qty 1

## 2021-04-22 NOTE — Progress Notes (Signed)
PHARMACY CONSULT NOTE ? ?Pharmacy Consult for Electrolyte Monitoring and Replacement  ? ?Recent Labs: ?Potassium (mmol/L)  ?Date Value  ?04/22/2021 4.3  ? ?Magnesium (mg/dL)  ?Date Value  ?04/22/2021 2.6 (H)  ? ?Calcium (mg/dL)  ?Date Value  ?04/22/2021 8.9  ? ?Albumin (g/dL)  ?Date Value  ?04/19/2021 4.0  ? ?Phosphorus (mg/dL)  ?Date Value  ?04/22/2021 4.1  ? ?Sodium (mmol/L)  ?Date Value  ?04/22/2021 138  ?10/13/2018 141  ? ? ?Assessment: 61 y.o female with significant PMH of mixed ischemic and nonischemic ischemic cardiomyopathy, CAD, alcoholic cirrhosis with ascites, hyperlipidemia, COPD, CKD stage III, combined systolic and diastolic CHF persistent atrial fibrillation on Eliquis, GERD, bipolar disorder, anxiety and depression, RLS, type 2 diabetes mellitus, thyroid disorder, tobacco abuse, EtOH abuse who presented to the ED with chief complaints of diarrhea, nausea, vomiting, abdominal pain, fever x 3 days.  ? ?Goal of Therapy:  ?Potassium 4.0 - 5.1 mmol/L ?Magnesium 2.0 - 2.4 mg/dL ?All Other Electrolytes WNL ? ?Plan:  ?Lytes stable/WNL today. Scr however 0.69>0.9. CTM w/ AM labs tomorrow. ?K4.2>4.3: no repletion at this time. ?Mg 2.6>2.6 No further repletion today. ?Recheck electrolytes in am ? ?Lorna Dibble ,PharmD ?Clinical Pharmacist ?04/22/2021 8:44 AM ? ?

## 2021-04-22 NOTE — Progress Notes (Addendum)
Cardiology Progress Note   Patient Name: Ariana Riggs Date of Encounter: 04/22/2021  Primary Cardiologist: Nelva Bush, MD  Subjective   Woke up last night and was disoriented.  Second night in a row - bothered by this.  Otw, feels a little off balance when up in room.  No presyncope, "just wobbly."  Also noted DOE, which seems worse than usual.  HRs trending ~ 1-teens.  Denies palpitations or chest pain.  Inpatient Medications    Scheduled Meds:  allopurinol  300 mg Oral Daily   apixaban  5 mg Oral BID   Chlorhexidine Gluconate Cloth  6 each Topical Daily   cholestyramine  4 g Oral BID   citalopram  20 mg Oral Daily   digoxin  0.125 mg Oral Daily   folic acid  1 mg Oral Daily   hydrocortisone sod succinate (SOLU-CORTEF) inj  100 mg Intravenous Q8H   insulin aspart  0-15 Units Subcutaneous Q4H   mouth rinse  15 mL Mouth Rinse BID   multivitamin with minerals  1 tablet Oral Daily   phenobarbital  97.2 mg Oral Q8H   Followed by   [START ON 04/23/2021] phenobarbital  64.8 mg Oral Q8H   Followed by   Derrill Memo ON 04/25/2021] phenobarbital  32.4 mg Oral Q8H   propranolol  80 mg Oral Q4H   propylthiouracil  250 mg Oral Q4H   Suvorexant  20 mg Oral QHS   thiamine  100 mg Oral Daily   traZODone  50 mg Oral QHS   Continuous Infusions:  sodium chloride Stopped (04/21/21 0958)   famotidine (PEPCID) IV 20 mg (04/22/21 0856)   PRN Meds: sodium chloride, colchicine, docusate sodium, LORazepam, polyethylene glycol   Vital Signs    Vitals:   04/22/21 0600 04/22/21 0754 04/22/21 0800 04/22/21 1200  BP: 103/71 90/74 113/80 125/75  Pulse: (!) 107 (!) 127 (!) 114 72  Resp: (!) 25   (!) 27  Temp:   98.2 F (36.8 C) 98.3 F (36.8 C)  TempSrc:   Oral Oral  SpO2:   97% 98%  Weight:      Height:        Intake/Output Summary (Last 24 hours) at 04/22/2021 1324 Last data filed at 04/22/2021 0500 Gross per 24 hour  Intake 1301.45 ml  Output --  Net 1301.45 ml   Filed Weights    04/20/21 0500 04/21/21 0400 04/22/21 0316  Weight: 77.8 kg 74.2 kg 78.9 kg    Physical Exam   GEN: Well nourished, well developed, in no acute distress.  HEENT: Grossly normal.  Neck: Supple, no JVD, carotid bruits, or masses. Cardiac: IR, IR, tachy, no murmurs, rubs, or gallops. No clubbing, cyanosis, edema.  Radials 2+, DP/PT 2+ and equal bilaterally.  Respiratory:  Respirations regular and unlabored, coarse breath sounds/rhonchi throughout.  Occas insp/exp wheeze bilat. GI: Soft, nontender, nondistended, BS + x 4. MS: no deformity or atrophy. Skin: warm and dry, no rash. Neuro:  Strength and sensation are intact. Psych: AAOx3.  Normal affect.  Labs    Chemistry Recent Labs  Lab 04/19/21 1941 04/20/21 0211 04/21/21 0440 04/22/21 0450  NA 139 137 137 138  K 4.1 3.8 4.2 4.3  CL 105 106 106 109  CO2 24 23 21* 20*  GLUCOSE 100* 108* 148* 126*  BUN '14 14 21 '$ 29*  CREATININE 0.71 0.68 0.69 0.90  CALCIUM 9.1 8.5* 8.9 8.9  PROT 7.5  --   --   --  ALBUMIN 4.0  --   --   --   AST 21  --   --   --   ALT 15  --   --   --   ALKPHOS 128*  --   --   --   BILITOT 1.2  --   --   --   GFRNONAA >60 >60 >60 >60  ANIONGAP '10 8 10 9     '$ Hematology Recent Labs  Lab 04/20/21 0211 04/21/21 0440 04/22/21 0450  WBC 4.5 4.9 6.3  RBC 3.86* 4.14 4.12  HGB 11.3* 11.8* 11.9*  HCT 34.3* 36.1 36.0  MCV 88.9 87.2 87.4  MCH 29.3 28.5 28.9  MCHC 32.9 32.7 33.1  RDW 15.0 14.9 15.2  PLT 123* 120* 120*    Cardiac Enzymes  Recent Labs  Lab 04/20/21 0211 04/20/21 0849  TROPONINIHS 8 7      BNP Recent Labs  Lab 04/19/21 1941  BNP 423.5*    Lipids  Lab Results  Component Value Date   CHOL 116 06/16/2019   HDL 32 (L) 06/16/2019   LDLCALC 38 06/16/2019   TRIG 231 (H) 06/16/2019   CHOLHDL 3.6 06/16/2019    HbA1c  Lab Results  Component Value Date   HGBA1C 5.4 04/20/2021    Radiology    CT ABDOMEN PELVIS W CONTRAST  Result Date: 04/19/2021 CLINICAL DATA:  Acute  abdominal pain on the right, initial encounter EXAM: CT ABDOMEN AND PELVIS WITH CONTRAST TECHNIQUE: Multidetector CT imaging of the abdomen and pelvis was performed using the standard protocol following bolus administration of intravenous contrast. RADIATION DOSE REDUCTION: This exam was performed according to the departmental dose-optimization program which includes automated exposure control, adjustment of the mA and/or kV according to patient size and/or use of iterative reconstruction technique. CONTRAST:  121m OMNIPAQUE IOHEXOL 300 MG/ML  SOLN COMPARISON:  Ultrasound from 03/16/2021 FINDINGS: Lower chest: No acute abnormality. Hepatobiliary: Gallbladder has been surgically removed. Liver demonstrates some heterogeneity without discrete mass. Prominence of the caudate lobe is noted as well consistent with cirrhotic change. This is likely the etiology of the heterogeneity. Pancreas: Unremarkable. No pancreatic ductal dilatation or surrounding inflammatory changes. Spleen: Spleen is prominent and stable from prior exams. Adrenals/Urinary Tract: Adrenal glands are within normal limits. Kidneys are well visualize within normal enhancement pattern. No renal mass is seen. No calculi or obstructive changes are noted. Ureters are within normal limits. Bladder is partially distended. Stomach/Bowel: No obstructive or inflammatory changes of the colon are seen. Mild diverticular changes noted. Fluid is noted within the colon and small bowel which may be related to an underlying diarrheal state. No inflammatory changes are noted. The stomach is within normal limits. Vascular/Lymphatic: Aortic atherosclerosis. No enlarged abdominal or pelvic lymph nodes. Reproductive: Uterus and bilateral adnexa are unremarkable. Other: Mild ascites is noted likely related to the underlying cirrhosis. Musculoskeletal: Degenerative changes of lumbar spine are noted. IMPRESSION: Changes consistent with cirrhosis of the liver with associated  splenomegaly and mild ascites. The cirrhosis likely contributes to the heterogeneity of the liver. No discrete mass is noted. Fluid within the colon and small bowel which may be related to a diarrheal state. Clinical correlation is recommended. No other focal abnormality is noted. Electronically Signed   By: MInez CatalinaM.D.   On: 04/19/2021 21:10   DG Chest Port 1 View  Result Date: 04/19/2021 CLINICAL DATA:  Right-sided chest and abdominal pain, initial encounter EXAM: PORTABLE CHEST 1 VIEW COMPARISON:  02/16/2021 FINDINGS: Cardiac shadow is  mildly prominent but stable. Postsurgical changes are again seen. The lungs are well aerated bilaterally. No acute bony abnormality is noted. IMPRESSION: No active disease. Electronically Signed   By: Inez Catalina M.D.   On: 04/19/2021 19:52   US Abdomen Limited RUQ (LIVER/GB)  Result Date: 04/20/2021 CLINICAL DATA:  Right upper quadrant pain EXAM: ULTRASOUND ABDOMEN LIMITED RIGHT UPPER QUADRANT COMPARISON:  CT from the previous day. FINDINGS: Gallbladder: Surgically removed Common bile duct: Diameter: 4.9 mm Liver: Increased in echogenicity consistent with the given clinical history of cirrhosis. Mild nodularity is noted. No focal mass is noted. Portal vein is patent on color Doppler imaging with normal direction of blood flow towards the liver. Other: Mild ascites IMPRESSION: Changes of cirrhosis with mild ascites. Status post cholecystectomy. Electronically Signed   By: Inez Catalina M.D.   On: 04/20/2021 03:14    Telemetry    Afib, 1-teens - Personally Reviewed  Cardiac Studies   Cardiac cath 11/2020 Conclusion   Conclusion: Nonobstructive coronary artery disease with 50-60% proximal LCx stenosis that is not hemodynamically significant (iFR 1.0) and mild plaquing in the proximal RCA. Widely patent mid/distal LCx stent. Mildly reduced left ventricular systolic function (EF 15-17%) with global hypokinesis and mildly elevated filling pressure (LVEDP 15-20  mmHg).   Recommendations: Continue secondary prevention of coronary artery disease. If no evidence of bleeding or vascular injury at right radial arteriotomy site, anticipate restarting apixaban 5 mg twice daily tomorrow morning.  Defer aspirin in the setting of long-term apixaban use. Escalate goal-directed medical therapy for treatment of nonischemic cardiomyopathy, as tolerated. Consider placement of ambulatory cardiac monitor at follow-up visit to assess for paroxysmal atrial fibrillation leading to intermittent chest pain. _____________   Echo 04/20/21  1. Left ventricular ejection fraction, by estimation, is 35 to 40%. The  left ventricle has moderately decreased function. The left ventricle  demonstrates global hypokinesis. The left ventricular internal cavity size  was mildly dilated. Left ventricular  diastolic parameters are indeterminate.   2. Right ventricular systolic function is normal. The right ventricular  size is mildly enlarged. There is normal pulmonary artery systolic  pressure.   3. Left atrial size was severely dilated.   4. Right atrial size was moderately dilated.   5. The mitral valve is normal in structure. Moderate mitral valve  regurgitation. No evidence of mitral stenosis.   6. Tricuspid valve regurgitation is moderate to severe.   7. The aortic valve is normal in structure. Aortic valve regurgitation is  not visualized. Aortic valve sclerosis/calcification is present, without  any evidence of aortic stenosis.  _____________   Patient Profile     61 y.o. female with a hx of mixed ischemic and nonischemic cardiomyopathy, heart failure with improved ejection fraction, CAD status post circumflex stenting in May 2021, polysubstance abuse, abdominal ascites, hyperlipidemia, tobacco abuse, COPD, depression, GERD, bipolar disorder, persistent A-fib on amiodarone and Eliquis who was admitted 3/5 w/ thyroid storm and afib w/ RVR.  Assessment & Plan    1.  Atrial  fibrillation with rapid ventricular response: Rapid atrial fibrillation in the setting of thyroid storm.  Previously on amiodarone which was discontinued in December 2022.  Heart rates have been trending in the 1 teens and thus far, she has tolerated propranolol and digoxin.  Pressures variable.  Propranolol increased to 80 mg every 4 hours today.  Pending response, will look to consolidate, preferably to guideline directed beta-blocker therapy in the setting of LV dysfunction.  She is anticoagulated with Eliquis.  2.  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  TSH < 0.010, FT4 2.02.  Management per critical care medicine team.  3.  Cardiomyopathy: EF 35 to 40% by echo in the setting of rapid atrial fibrillation.  Euvolemic on examination.  Continue beta-blocker therapy with plan to transition to long-acting metoprolol if tolerated.  Soft blood pressures currently otherwise limiting GDMT.  Plan to follow-up echo at some point in the future, once sinus rhythm reliably restored.  4.  Coronary artery disease: Status post prior circumflex stenting in May 2021.  Patent circumflex stent by catheterization October 2022 with otherwise nonobstructive circumflex disease.  No aspirin in the setting of Eliquis.  Continue beta-blocker.  Resume home dose of atorvastatin.  5.  Alcohol abuse:  Says she quit drinking 2 wks ago.    6.  Tobacco abuse: Says that she is done with cigarettes, though was smoking up until admission.  Signed, Murray Hodgkins, NP  04/22/2021, 1:24 PM    For questions or updates, please contact   Please consult [email or web address] for contact info under Cardiology/STEMI.

## 2021-04-22 NOTE — Progress Notes (Signed)
Patient awakened very disoriented, only oriented to person. Reorientation and redirection provided. Patient placed back on monitor and back in bed. Bed alarm armed. Will continue to monitor.  ?
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
## 2021-04-23 DIAGNOSIS — I4819 Other persistent atrial fibrillation: Secondary | ICD-10-CM

## 2021-04-23 DIAGNOSIS — F1010 Alcohol abuse, uncomplicated: Secondary | ICD-10-CM | POA: Diagnosis present

## 2021-04-23 DIAGNOSIS — K7460 Unspecified cirrhosis of liver: Secondary | ICD-10-CM

## 2021-04-23 DIAGNOSIS — F319 Bipolar disorder, unspecified: Secondary | ICD-10-CM

## 2021-04-23 DIAGNOSIS — I4891 Unspecified atrial fibrillation: Secondary | ICD-10-CM

## 2021-04-23 DIAGNOSIS — I5020 Unspecified systolic (congestive) heart failure: Secondary | ICD-10-CM

## 2021-04-23 DIAGNOSIS — R188 Other ascites: Secondary | ICD-10-CM

## 2021-04-23 DIAGNOSIS — E0591 Thyrotoxicosis, unspecified with thyrotoxic crisis or storm: Principal | ICD-10-CM

## 2021-04-23 DIAGNOSIS — A0811 Acute gastroenteropathy due to Norwalk agent: Secondary | ICD-10-CM | POA: Diagnosis present

## 2021-04-23 DIAGNOSIS — E663 Overweight: Secondary | ICD-10-CM

## 2021-04-23 DIAGNOSIS — A419 Sepsis, unspecified organism: Secondary | ICD-10-CM

## 2021-04-23 LAB — CBC
HCT: 38.5 % (ref 36.0–46.0)
Hemoglobin: 12.2 g/dL (ref 12.0–15.0)
MCH: 28.4 pg (ref 26.0–34.0)
MCHC: 31.7 g/dL (ref 30.0–36.0)
MCV: 89.5 fL (ref 80.0–100.0)
Platelets: 122 10*3/uL — ABNORMAL LOW (ref 150–400)
RBC: 4.3 MIL/uL (ref 3.87–5.11)
RDW: 15.2 % (ref 11.5–15.5)
WBC: 3.9 10*3/uL — ABNORMAL LOW (ref 4.0–10.5)
nRBC: 0 % (ref 0.0–0.2)

## 2021-04-23 LAB — HEPATIC FUNCTION PANEL
ALT: 15 U/L (ref 0–44)
AST: 17 U/L (ref 15–41)
Albumin: 3.6 g/dL (ref 3.5–5.0)
Alkaline Phosphatase: 113 U/L (ref 38–126)
Bilirubin, Direct: 0.3 mg/dL — ABNORMAL HIGH (ref 0.0–0.2)
Indirect Bilirubin: 0.5 mg/dL (ref 0.3–0.9)
Total Bilirubin: 0.8 mg/dL (ref 0.3–1.2)
Total Protein: 6.3 g/dL — ABNORMAL LOW (ref 6.5–8.1)

## 2021-04-23 LAB — BASIC METABOLIC PANEL
Anion gap: 12 (ref 5–15)
BUN: 26 mg/dL — ABNORMAL HIGH (ref 8–23)
CO2: 19 mmol/L — ABNORMAL LOW (ref 22–32)
Calcium: 8.9 mg/dL (ref 8.9–10.3)
Chloride: 108 mmol/L (ref 98–111)
Creatinine, Ser: 0.8 mg/dL (ref 0.44–1.00)
GFR, Estimated: >60 mL/min (ref >60)
Glucose, Bld: 128 mg/dL — ABNORMAL HIGH (ref 70–99)
Potassium: 4.1 mmol/L (ref 3.5–5.1)
Sodium: 139 mmol/L (ref 135–145)

## 2021-04-23 LAB — GLUCOSE, CAPILLARY
Glucose-Capillary: 112 mg/dL — ABNORMAL HIGH (ref 70–99)
Glucose-Capillary: 113 mg/dL — ABNORMAL HIGH (ref 70–99)
Glucose-Capillary: 126 mg/dL — ABNORMAL HIGH (ref 70–99)
Glucose-Capillary: 129 mg/dL — ABNORMAL HIGH (ref 70–99)
Glucose-Capillary: 137 mg/dL — ABNORMAL HIGH (ref 70–99)
Glucose-Capillary: 147 mg/dL — ABNORMAL HIGH (ref 70–99)

## 2021-04-23 LAB — T3: T3, Total: 51 ng/dL — ABNORMAL LOW (ref 71–180)

## 2021-04-23 LAB — THYROID PANEL WITH TSH
Free Thyroxine Index: 3 (ref 1.2–4.9)
T3 Uptake Ratio: 38 % (ref 24–39)
T4, Total: 7.9 ug/dL (ref 4.5–12.0)
TSH: <0.005 u[IU]/mL — ABNORMAL LOW (ref 0.450–4.500)

## 2021-04-23 LAB — MAGNESIUM: Magnesium: 2.5 mg/dL — ABNORMAL HIGH (ref 1.7–2.4)

## 2021-04-23 LAB — PHOSPHORUS: Phosphorus: 3.9 mg/dL (ref 2.5–4.6)

## 2021-04-23 LAB — T4, FREE: Free T4: 1.76 ng/dL — ABNORMAL HIGH (ref 0.61–1.12)

## 2021-04-23 MED ORDER — PROPRANOLOL HCL ER 80 MG PO CP24
160.0000 mg | ORAL_CAPSULE | Freq: Two times a day (BID) | ORAL | Status: DC
  Filled 2021-04-23: qty 2

## 2021-04-23 MED ORDER — PROPRANOLOL HCL 40 MG PO TABS
80.0000 mg | ORAL_TABLET | Freq: Once | ORAL | Status: AC
  Administered 2021-04-23: 19:00:00 80 mg via ORAL
  Filled 2021-04-23: qty 2

## 2021-04-23 MED ORDER — PROPRANOLOL HCL ER 80 MG PO CP24
160.0000 mg | ORAL_CAPSULE | Freq: Two times a day (BID) | ORAL | Status: DC
Start: 2021-04-23 — End: 2021-04-24
  Administered 2021-04-24: 160 mg via ORAL
  Filled 2021-04-23 (×3): qty 2

## 2021-04-23 NOTE — Assessment & Plan Note (Signed)
Meets criteria BMI greater than 25 

## 2021-04-23 NOTE — Assessment & Plan Note (Signed)
Incidental.  Stable.

## 2021-04-23 NOTE — Assessment & Plan Note (Addendum)
Cardiology on board.  Amiodarone discontinued.  Continue propranolol and digoxin and Eliquis.  Propranolol in place of her home metoprolol.  Secondary to hyperthyroidism as above. ?

## 2021-04-23 NOTE — Assessment & Plan Note (Signed)
Stable

## 2021-04-23 NOTE — Hospital Course (Addendum)
61 year old female with past medical history of alcohol cirrhosis with ascites, COPD, atrial fibrillation on Eliquis chronic systolic/diastolic heart failure, CAD and diabetes mellitus presented to the emergency room on 3/5 with complaints of nausea vomiting, diarrhea and abdominal pain x3 days and initially thought to be in septic shock secondary to colitis.  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  Sepsis was ruled out.  Over next few days, able to be weaned off of pressor support.  Hospital course was complicated by alcohol withdrawal requiring phenobarbital therapy as well as atrial fibrillation with rapid ventricular rate.   ?

## 2021-04-23 NOTE — Progress Notes (Signed)
Cardiology Progress Note   Patient Name: Ariana Riggs Date of Encounter: 04/23/2021  Primary Cardiologist: Nelva Bush, MD  Subjective   Feels well.  Coughing some, mildly productive.  Heart rates remain ~ 100-110.  Inpatient Medications    Scheduled Meds:  allopurinol  300 mg Oral Daily   apixaban  5 mg Oral BID   atorvastatin  10 mg Oral Daily   Chlorhexidine Gluconate Cloth  6 each Topical Daily   cholestyramine  4 g Oral BID   citalopram  20 mg Oral Daily   digoxin  0.125 mg Oral Daily   folic acid  1 mg Oral Daily   hydrocortisone sod succinate (SOLU-CORTEF) inj  100 mg Intravenous Q8H   insulin aspart  0-15 Units Subcutaneous Q4H   mouth rinse  15 mL Mouth Rinse BID   multivitamin with minerals  1 tablet Oral Daily   phenobarbital  64.8 mg Oral Q8H   Followed by   Derrill Memo ON 04/25/2021] phenobarbital  32.4 mg Oral Q8H   propranolol  80 mg Oral Q4H   propylthiouracil  250 mg Oral Q4H   Suvorexant  20 mg Oral QHS   thiamine  100 mg Oral Daily   traZODone  50 mg Oral QHS   Continuous Infusions:  sodium chloride Stopped (04/21/21 0958)   famotidine (PEPCID) IV 20 mg (04/23/21 0918)   PRN Meds: sodium chloride, colchicine, docusate sodium, LORazepam, polyethylene glycol   Vital Signs    Vitals:   04/23/21 0900 04/23/21 0915 04/23/21 1000 04/23/21 1130  BP: 95/77 95/77 126/85 (!) 151/111  Pulse: (!) 38 (!) 115 74 (!) 118  Resp: 15  (!) 24   Temp:      TempSrc:      SpO2:      Weight:      Height:        Intake/Output Summary (Last 24 hours) at 04/23/2021 1132 Last data filed at 04/23/2021 1000 Gross per 24 hour  Intake 850 ml  Output --  Net 850 ml   Filed Weights   04/20/21 0500 04/21/21 0400 04/22/21 0316  Weight: 77.8 kg 74.2 kg 78.9 kg    Physical Exam   GEN: Well nourished, well developed, in no acute distress.  HEENT: Grossly normal.  Neck: Supple, no JVD, carotid bruits, or masses. Cardiac: IR, IR, tachy, no murmurs, rubs, or  gallops. No clubbing, cyanosis, edema.  Radials 2+, DP/PT 2+ and equal bilaterally.  Respiratory:  Respirations regular and unlabored, coarse breath sounds/rhonchi throughout.   GI: Soft, nontender, nondistended, BS + x 4. MS: no deformity or atrophy. Skin: warm and dry, no rash. Neuro:  Strength and sensation are intact. Psych: AAOx3.  Normal affect.  Labs    Chemistry Recent Labs  Lab 04/19/21 1941 04/20/21 0211 04/21/21 0440 04/22/21 0450 04/23/21 0535  NA 139   < > 137 138 139  K 4.1   < > 4.2 4.3 4.1  CL 105   < > 106 109 108  CO2 24   < > 21* 20* 19*  GLUCOSE 100*   < > 148* 126* 128*  BUN 14   < > 21 29* 26*  CREATININE 0.71   < > 0.69 0.90 0.80  CALCIUM 9.1   < > 8.9 8.9 8.9  PROT 7.5  --   --   --  6.3*  ALBUMIN 4.0  --   --   --  3.6  AST 21  --   --   --  17  ALT 15  --   --   --  15  ALKPHOS 128*  --   --   --  113  BILITOT 1.2  --   --   --  0.8  GFRNONAA >60   < > >60 >60 >60  ANIONGAP 10   < > '10 9 12   '$ < > = values in this interval not displayed.     Hematology Recent Labs  Lab 04/21/21 0440 04/22/21 0450 04/23/21 0535  WBC 4.9 6.3 3.9*  RBC 4.14 4.12 4.30  HGB 11.8* 11.9* 12.2  HCT 36.1 36.0 38.5  MCV 87.2 87.4 89.5  MCH 28.5 28.9 28.4  MCHC 32.7 33.1 31.7  RDW 14.9 15.2 15.2  PLT 120* 120* 122*    Cardiac Enzymes  Recent Labs  Lab 04/20/21 0211 04/20/21 0849  TROPONINIHS 8 7      BNP Recent Labs  Lab 04/19/21 1941  BNP 423.5*     Lipids  Lab Results  Component Value Date   CHOL 116 06/16/2019   HDL 32 (L) 06/16/2019   LDLCALC 38 06/16/2019   TRIG 231 (H) 06/16/2019   CHOLHDL 3.6 06/16/2019    HbA1c  Lab Results  Component Value Date   HGBA1C 5.4 04/20/2021    Radiology    CT ABDOMEN PELVIS W CONTRAST  Result Date: 04/19/2021 CLINICAL DATA:  Acute abdominal pain on the right, initial encounter EXAM: CT ABDOMEN AND PELVIS WITH CONTRAST TECHNIQUE: Multidetector CT imaging of the abdomen and pelvis was performed  using the standard protocol following bolus administration of intravenous contrast. RADIATION DOSE REDUCTION: This exam was performed according to the departmental dose-optimization program which includes automated exposure control, adjustment of the mA and/or kV according to patient size and/or use of iterative reconstruction technique. CONTRAST:  161m OMNIPAQUE IOHEXOL 300 MG/ML  SOLN COMPARISON:  Ultrasound from 03/16/2021 FINDINGS: Lower chest: No acute abnormality. Hepatobiliary: Gallbladder has been surgically removed. Liver demonstrates some heterogeneity without discrete mass. Prominence of the caudate lobe is noted as well consistent with cirrhotic change. This is likely the etiology of the heterogeneity. Pancreas: Unremarkable. No pancreatic ductal dilatation or surrounding inflammatory changes. Spleen: Spleen is prominent and stable from prior exams. Adrenals/Urinary Tract: Adrenal glands are within normal limits. Kidneys are well visualize within normal enhancement pattern. No renal mass is seen. No calculi or obstructive changes are noted. Ureters are within normal limits. Bladder is partially distended. Stomach/Bowel: No obstructive or inflammatory changes of the colon are seen. Mild diverticular changes noted. Fluid is noted within the colon and small bowel which may be related to an underlying diarrheal state. No inflammatory changes are noted. The stomach is within normal limits. Vascular/Lymphatic: Aortic atherosclerosis. No enlarged abdominal or pelvic lymph nodes. Reproductive: Uterus and bilateral adnexa are unremarkable. Other: Mild ascites is noted likely related to the underlying cirrhosis. Musculoskeletal: Degenerative changes of lumbar spine are noted. IMPRESSION: Changes consistent with cirrhosis of the liver with associated splenomegaly and mild ascites. The cirrhosis likely contributes to the heterogeneity of the liver. No discrete mass is noted. Fluid within the colon and small bowel  which may be related to a diarrheal state. Clinical correlation is recommended. No other focal abnormality is noted. Electronically Signed   By: MInez CatalinaM.D.   On: 04/19/2021 21:10   DG Chest Port 1 View  Result Date: 04/19/2021 CLINICAL DATA:  Right-sided chest and abdominal pain, initial encounter EXAM: PORTABLE CHEST 1 VIEW COMPARISON:  02/16/2021 FINDINGS: Cardiac shadow  is mildly prominent but stable. Postsurgical changes are again seen. The lungs are well aerated bilaterally. No acute bony abnormality is noted. IMPRESSION: No active disease. Electronically Signed   By: Inez Catalina M.D.   On: 04/19/2021 19:52   US Abdomen Limited RUQ (LIVER/GB)  Result Date: 04/20/2021 CLINICAL DATA:  Right upper quadrant pain EXAM: ULTRASOUND ABDOMEN LIMITED RIGHT UPPER QUADRANT COMPARISON:  CT from the previous day. FINDINGS: Gallbladder: Surgically removed Common bile duct: Diameter: 4.9 mm Liver: Increased in echogenicity consistent with the given clinical history of cirrhosis. Mild nodularity is noted. No focal mass is noted. Portal vein is patent on color Doppler imaging with normal direction of blood flow towards the liver. Other: Mild ascites IMPRESSION: Changes of cirrhosis with mild ascites. Status post cholecystectomy. Electronically Signed   By: Inez Catalina M.D.   On: 04/20/2021 03:14    Telemetry    Afib, 100-110 - Personally Reviewed  Cardiac Studies   Cardiac cath 11/2020 Conclusion   Conclusion: Nonobstructive coronary artery disease with 50-60% proximal LCx stenosis that is not hemodynamically significant (iFR 1.0) and mild plaquing in the proximal RCA. Widely patent mid/distal LCx stent. Mildly reduced left ventricular systolic function (EF 73-53%) with global hypokinesis and mildly elevated filling pressure (LVEDP 15-20 mmHg).   Recommendations: Continue secondary prevention of coronary artery disease. If no evidence of bleeding or vascular injury at right radial arteriotomy  site, anticipate restarting apixaban 5 mg twice daily tomorrow morning.  Defer aspirin in the setting of long-term apixaban use. Escalate goal-directed medical therapy for treatment of nonischemic cardiomyopathy, as tolerated. Consider placement of ambulatory cardiac monitor at follow-up visit to assess for paroxysmal atrial fibrillation leading to intermittent chest pain. _____________    Echo 04/20/21  1. Left ventricular ejection fraction, by estimation, is 35 to 40%. The  left ventricle has moderately decreased function. The left ventricle  demonstrates global hypokinesis. The left ventricular internal cavity size  was mildly dilated. Left ventricular  diastolic parameters are indeterminate.   2. Right ventricular systolic function is normal. The right ventricular  size is mildly enlarged. There is normal pulmonary artery systolic  pressure.   3. Left atrial size was severely dilated.   4. Right atrial size was moderately dilated.   5. The mitral valve is normal in structure. Moderate mitral valve  regurgitation. No evidence of mitral stenosis.   6. Tricuspid valve regurgitation is moderate to severe.   7. The aortic valve is normal in structure. Aortic valve regurgitation is  not visualized. Aortic valve sclerosis/calcification is present, without  any evidence of aortic stenosis.  _____________   Patient Profile     61 y.o. female with a hx of mixed ischemic and nonischemic cardiomyopathy, heart failure with improved ejection fraction, CAD status post circumflex stenting in May 2021, polysubstance abuse, abdominal ascites, hyperlipidemia, tobacco abuse, COPD, depression, GERD, bipolar disorder, persistent A-fib on amiodarone and Eliquis who was admitted 3/5 w/ thyroid storm and afib w/ RVR.  Assessment & Plan    1.  Persistent AFib w/ RVR:  Rapid afib in setting of thyroid storm.  Prev on amio, d/c'd in 01/2021.  Currently on digoxin 0.25 daily and propranolol 80 q4h.  Will need to  start consolidating ? blocker as q4h dosing won't be tenable as outpt.  Also w/ cardiomyopathy, would prefer to switch to metoprolol succinate, as BP tolerates.  Cont OAC w/ eliquis.  Once thyroid status improved, can look to use an alternate AAD and pursue DCCV if necessary.  With reduced EF, AAD options limited to tikosyn.  2.  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  TSH < 0.010, FT4 2.02.  Management per critical care medicine team.  3.  Cardiomyopathy:  EF 35 to 40% by echo in the setting of rapid atrial fibrillation.  Euvolemic on examination.  + 490 yesterday and + 3428 since admission.  Continue beta-blocker therapy with plan to transition to long-acting metoprolol if tolerated.  Soft blood pressures currently otherwise limiting GDMT.  Plan to follow-up echo at some point in the future, once sinus rhythm reliably restored.  4.  CAD:  Status post prior circumflex stenting in May 2021.  Patent circumflex stent by catheterization October 2022 with otherwise nonobstructive circumflex disease.  No aspirin in the setting of Eliquis.  Continue beta-blocker and atorvastatin.  5.  ETOH Abuse:  quit drinking 2 wks ago.  6.  Tob Abuse:  Was smoking up until admission.  Says she's done w/ cigarettes.   Signed, Murray Hodgkins, NP  04/23/2021, 11:32 AM    For questions or updates, please contact   Please consult [email or web address] for contact info under Cardiology/STEMI.

## 2021-04-23 NOTE — Assessment & Plan Note (Signed)
Cardiac cath in 2021 noted ejection fraction of 35-40%.  Currently euvolemic.  Once heart rate is under control, will check repeat echocardiogram.  Cardiology following. ?

## 2021-04-23 NOTE — Assessment & Plan Note (Addendum)
Started on phenobarbital, although her last drink was 2 weeks ago.  I do not think that she has alcohol withdrawal rather her symptoms are more due to thyroid storm.  Prior to discharge, patient had 2 more days of phenobarbital taper.  Discussed with pharmacy and since she normally takes Ativan twice daily which she had not been getting during the hospital, no need to further continue phenobarbital and she just needs to resume her Ativan at home. ?

## 2021-04-23 NOTE — Assessment & Plan Note (Addendum)
Started on PTU, SSKI, propranolol and hydrocortisone.  PTU converted over to methimazole.  Hydrocortisone converted to p.o. prednisone with taper.  Patient will be continued on beta-blocker.  Once thyroid evens out, beta-blocker can be weaned off. ?

## 2021-04-23 NOTE — Progress Notes (Signed)
PHARMACY CONSULT NOTE ? ?Pharmacy Consult for Electrolyte Monitoring and Replacement  ? ?Recent Labs: ?Potassium (mmol/L)  ?Date Value  ?04/23/2021 4.1  ? ?Magnesium (mg/dL)  ?Date Value  ?04/23/2021 2.5 (H)  ? ?Calcium (mg/dL)  ?Date Value  ?04/23/2021 8.9  ? ?Albumin (g/dL)  ?Date Value  ?04/23/2021 3.6  ? ?Phosphorus (mg/dL)  ?Date Value  ?04/23/2021 3.9  ? ?Sodium (mmol/L)  ?Date Value  ?04/23/2021 139  ?10/13/2018 141  ? ? ?Assessment: 61 y.o female with significant PMH of mixed ischemic and nonischemic ischemic cardiomyopathy, CAD, alcoholic cirrhosis with ascites, hyperlipidemia, COPD, CKD stage III, combined systolic and diastolic CHF persistent atrial fibrillation on Eliquis, GERD, bipolar disorder, anxiety and depression, RLS, type 2 diabetes mellitus, thyroid disorder, tobacco abuse, EtOH abuse who presented to the ED with chief complaints of diarrhea, nausea, vomiting, abdominal pain, fever x 3 days.  ? ?Goal of Therapy:  ?Potassium 4.0 - 5.1 mmol/L ?Magnesium 2.0 - 2.4 mg/dL ?All Other Electrolytes WNL ? ?Plan:  ?Lytes stable/WNL today. Scr however 0.69>0.9>0.8. CTM w/ AM labs tomorrow. ?K4.2>4.3: no repletion at this time. ?Mg 2.6>2.6 No further repletion today. ?Recheck electrolytes in am ? ?Lorna Dibble, PharmD, BCCP ?Clinical Pharmacist ?04/23/2021 9:12 AM ? ? ?

## 2021-04-23 NOTE — Progress Notes (Addendum)
[facility] Progress Note  Patient: Ariana Riggs    VQM:[id number]  DOA: 04/19/2021    Date of Service: the patient was seen and examined on 04/23/2021  Brief hospital course: 61 year old female with past medical history of alcohol cirrhosis with ascites, COPD, atrial fibrillation on Eliquis chronic systolic/diastolic heart failure, CAD and diabetes mellitus presented to the emergency room on 3/5 with complaints of nausea vomiting, diarrhea and abdominal pain x3 days and initially thought to be in septic shock secondary to colitis.  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  Sepsis was ruled out.  Over next few days, able to be weaned off of pressor support.  Hospital course was complicated by alcohol withdrawal requiring phenobarbital therapy as well as atrial fibrillation with rapid ventricular rate.    Assessment and Plan: Assessment and Plan: * Hyperthyroidism with storm Started on PTU, SSKI, propranolol and hydrocortisone.  Checking free T4  Atrial fibrillation with rapid ventricular response [facility]) Cardiology on board.  Amiodarone discontinued.  Continue propranolol and digoxin and Eliquis.  Convert propranolol to metoprolol.  Secondary to hyperthyroidism as above.  HFrEF (heart failure with reduced ejection fraction) ([place]) Cardiac cath in 2021 noted ejection fraction of 35-40%.  Currently euvolemic.  Once heart rate is under control, will check repeat echocardiogram.  Cardiology following.  Alcohol abuse Started on phenobarbital, although her last drink was 2 weeks ago.  I do not think that she has alcohol withdrawal rather her symptoms are more due to thyroid storm.  Hepatic cirrhosis (HCC) Stable  COPD (chronic obstructive pulmonary disease)  (HCC) Stable.  Norovirus Incidental.  Stable.  Overweight (BMI 25.0-29.9) Meets criteria BMI greater than 25  Patient thought to have chronic kidney disease, but renal function actually improved and normal now.  Chronic kidney disease ruled out.     Body mass index is 28.08 kg/m.        Consultants: Cardiology Critical care  Procedures: Echocardiogram done 3/6: Ejection fraction 35 to 40%  Antimicrobials: None  Code Status: Full code   Subjective: Patient doing well.  No complaints.  Denies any shortness of breath, pain or dizziness.  Objective: Borderline tachycardia, noted some elevated blood pressures Vitals:   04/23/21 1500 04/23/21 1625  BP:  112/79  Pulse: (!) 129 99  Resp: 17 16  Temp:  97.6 F (36.4 C)  SpO2: 98% 100%    Intake/Output Summary (Last 24 hours) at 04/23/2021 1647 Last data filed at 04/23/2021 1300 Gross per 24 hour  Intake 1210 ml  Output --  Net 1210 ml   Filed Weights   04/20/21 0500 04/21/21 0400 04/22/21 0316  Weight: 77.8 kg 74.2 kg 78.9 kg   Body mass index is 28.08 kg/m.  Exam:  General: Alert and oriented x3, no acute distress HEENT: Normocephalic, atraumatic, mucous membranes slightly dry Cardiovascular: Irregular rhythm, tachycardic Respiratory: Clear to auscultation bilaterally Abdomen: Soft, nontender, nondistended, positive bowel sounds Musculoskeletal: No clubbing or cyanosis, trace pitting edema Skin: No skin breaks, tears or lesions Psychiatry: Appropriate, no evidence of psychoses Neurology: No focal deficits  Data Reviewed: Slightly low platelet count of 122.  Disposition:  Status is: Inpatient Remains inpatient appropriate because: Stabilization of heart rate    Family Communication: Husband at the bedside DVT Prophylaxis:  apixaban (ELIQUIS) tablet 5 mg    Author:  Annita Brod ,MD 04/23/2021 4:47 PM  To reach On-call, see care teams to locate the attending and reach out via  [email or web address]. Between 7PM-7AM, please contact night-coverage If you still have difficulty reaching the attending provider, please page the [facility] (Director on Call) for [facility] on amion for assistance.

## 2021-04-24 LAB — CBC
HCT: 38.3 % (ref 36.0–46.0)
Hemoglobin: 12.5 g/dL (ref 12.0–15.0)
MCH: 29.1 pg (ref 26.0–34.0)
MCHC: 32.6 g/dL (ref 30.0–36.0)
MCV: 89.1 fL (ref 80.0–100.0)
Platelets: 143 10*3/uL — ABNORMAL LOW (ref 150–400)
RBC: 4.3 MIL/uL (ref 3.87–5.11)
RDW: 15.3 % (ref 11.5–15.5)
WBC: 4.3 10*3/uL (ref 4.0–10.5)
nRBC: 0.5 % — ABNORMAL HIGH (ref 0.0–0.2)

## 2021-04-24 LAB — BASIC METABOLIC PANEL
Anion gap: 6 (ref 5–15)
BUN: 26 mg/dL — ABNORMAL HIGH (ref 8–23)
CO2: 22 mmol/L (ref 22–32)
Calcium: 8.7 mg/dL — ABNORMAL LOW (ref 8.9–10.3)
Chloride: 111 mmol/L (ref 98–111)
Creatinine, Ser: 0.87 mg/dL (ref 0.44–1.00)
GFR, Estimated: >60 mL/min (ref >60)
Glucose, Bld: 98 mg/dL (ref 70–99)
Potassium: 4.1 mmol/L (ref 3.5–5.1)
Sodium: 139 mmol/L (ref 135–145)

## 2021-04-24 LAB — MAGNESIUM: Magnesium: 2.7 mg/dL — ABNORMAL HIGH (ref 1.7–2.4)

## 2021-04-24 LAB — HEPATIC FUNCTION PANEL
ALT: 14 U/L (ref 0–44)
AST: 17 U/L (ref 15–41)
Albumin: 3.6 g/dL (ref 3.5–5.0)
Alkaline Phosphatase: 110 U/L (ref 38–126)
Bilirubin, Direct: 0.2 mg/dL (ref 0.0–0.2)
Indirect Bilirubin: 0.5 mg/dL (ref 0.3–0.9)
Total Bilirubin: 0.7 mg/dL (ref 0.3–1.2)
Total Protein: 6.4 g/dL — ABNORMAL LOW (ref 6.5–8.1)

## 2021-04-24 LAB — GLUCOSE, CAPILLARY
Glucose-Capillary: 103 mg/dL — ABNORMAL HIGH (ref 70–99)
Glucose-Capillary: 118 mg/dL — ABNORMAL HIGH (ref 70–99)

## 2021-04-24 LAB — PHOSPHORUS: Phosphorus: 4.1 mg/dL (ref 2.5–4.6)

## 2021-04-24 LAB — CULTURE, BLOOD (ROUTINE X 2)
Culture: NO GROWTH
Special Requests: ADEQUATE

## 2021-04-24 MED ORDER — PROPRANOLOL HCL ER 160 MG PO CP24: 160.0000 mg | ORAL_CAPSULE | Freq: Two times a day (BID) | ORAL | 1 refills | Status: DC

## 2021-04-24 MED ORDER — DIGOXIN 125 MCG PO TABS: 0.1250 mg | ORAL_TABLET | Freq: Every day | ORAL | 1 refills | Status: DC

## 2021-04-24 MED ORDER — PREDNISONE 50 MG PO TABS: 60.0000 mg | ORAL_TABLET | Freq: Every day | ORAL | Status: DC

## 2021-04-24 MED ORDER — METHIMAZOLE 10 MG PO TABS: 20.0000 mg | ORAL_TABLET | Freq: Two times a day (BID) | ORAL | 1 refills | Status: DC

## 2021-04-24 MED ORDER — PREDNISONE 20 MG PO TABS: ORAL_TABLET | ORAL | 0 refills | Status: AC

## 2021-04-24 MED ORDER — METHIMAZOLE 10 MG PO TABS
20.0000 mg | ORAL_TABLET | Freq: Two times a day (BID) | ORAL | Status: DC
  Administered 2021-04-24: 20 mg via ORAL
  Filled 2021-04-24 (×2): qty 2

## 2021-04-24 NOTE — Progress Notes (Signed)
[name] to be D/C'd Home per MD order.  Discussed prescriptions and follow up appointments with the patient and husband. Prescriptions given to patient, medication list explained in detail. Pt verbalized understanding. ? ?Allergies as of 04/24/2021   ? ?   Reactions  ? Melatonin Hives  ? Doxycycline Nausea And Vomiting  ? Erythromycin Nausea And Vomiting  ? Paxil [paroxetine Hcl] Hives  ? Penicillins Hives  ? Sulfa Antibiotics Hives  ? ?  ? ?  ?Medication List  ?  ? ?STOP taking these medications   ? ?metoprolol succinate 25 MG 24 hr tablet ?Commonly known as: TOPROL-XL ?  ? ?  ? ?TAKE these medications   ? ?albuterol 108 (90 Base) MCG/ACT inhaler ?Commonly known as: VENTOLIN HFA ?Inhale 2 puffs into the lungs every 6 (six) hours as needed for wheezing or shortness of breath. ?  ?allopurinol 300 MG tablet ?Commonly known as: ZYLOPRIM ?Take 300 mg by mouth daily. ?  ?apixaban 5 MG Tabs tablet ?Commonly known as: ELIQUIS ?Take 1 tablet (5 mg total) by mouth 2 (two) times daily. ?  ?atorvastatin 10 MG tablet ?Commonly known as: LIPITOR ?TAKE 1 TABLET BY MOUTH EVERY DAY ?  ?Belsomra 20 MG Tabs ?Generic drug: Suvorexant ?Take 20 mg by mouth at bedtime. ?  ?citalopram 20 MG tablet ?Commonly known as: CELEXA ?Take 20 mg by mouth daily. ?  ?Clear Eyes for Dry Eyes 1-0.25 % Soln ?Generic drug: Carboxymethylcellul-Glycerin ?Place 1 drop into both eyes daily. ?  ?colchicine 0.6 MG tablet ?Take 0.6 mg by mouth 2 (two) times daily as needed (Gout). ?  ?digoxin 0.125 MG tablet ?Commonly known as: LANOXIN ?Take 1 tablet (0.125 mg total) by mouth daily. ?Start taking on: April 25, 2021 ?  ?fluticasone 50 MCG/ACT nasal spray ?Commonly known as: FLONASE ?Place 2 sprays into both nostrils daily as needed for allergies. ?  ?furosemide 40 MG tablet ?Commonly known as: LASIX ?Take 1 tablet (40 mg total) by mouth 2 (two) times daily. ?  ?lansoprazole 30 MG capsule ?Commonly known as: PREVACID ?Take 30 mg by mouth daily at 12  noon. ?  ?Lantus SoloStar 100 UNIT/ML Solostar Pen ?Generic drug: insulin glargine ?Inject 5 Units into the skin at bedtime. ?  ?LORazepam 1 MG tablet ?Commonly known as: ATIVAN ?Take 1 mg by mouth 2 (two) times daily. ?  ?losartan 25 MG tablet ?Commonly known as: COZAAR ?TAKE 1/2 TABLET BY MOUTH EVERY DAY ?  ?methimazole 10 MG tablet ?Commonly known as: TAPAZOLE ?Take 2 tablets (20 mg total) by mouth 2 (two) times daily. ?  ?montelukast 10 MG tablet ?Commonly known as: SINGULAIR ?Take 10 mg by mouth at bedtime. ?  ?nitroGLYCERIN 0.4 MG SL tablet ?Commonly known as: NITROSTAT ?Place 1 tablet (0.4 mg total) under the tongue every 5 (five) minutes as needed for chest pain. ?  ?potassium chloride SA 20 MEQ tablet ?Commonly known as: KLOR-CON M ?Take 1 tablet by mouth 2 (two) times daily. ?  ?predniSONE 20 MG tablet ?Commonly known as: DELTASONE ?Take 3 tablets (60 mg total) by mouth daily with breakfast for 3 days, THEN 2.5 tablets (50 mg total) daily with breakfast for 3 days, THEN 2 tablets (40 mg total) daily with breakfast for 3 days, THEN 1.5 tablets (30 mg total) daily with breakfast for 3 days, THEN 1 tablet (20 mg total) daily with breakfast for 3 days, THEN 0.5 tablets (10 mg total) daily with breakfast for 4 days. ?Start taking on: April 25, 2021 ?  ?  propranolol ER 160 MG SR capsule ?Commonly known as: INDERAL LA ?Take 1 capsule (160 mg total) by mouth 2 (two) times daily. ?  ?spironolactone 25 MG tablet ?Commonly known as: ALDACTONE ?Take 1 tablet (25 mg total) by mouth daily. ?  ?traZODone 50 MG tablet ?Commonly known as: DESYREL ?Take 50 mg by mouth at bedtime. ?  ?umeclidinium-vilanterol 62.5-25 MCG/INH Aepb ?Commonly known as: ANORO ELLIPTA ?Inhale 1 puff into the lungs daily. ?  ?Xifaxan 550 MG Tabs tablet ?Generic drug: rifaximin ?Take 550 mg by mouth 2 (two) times daily. ?  ? ?  ? ? ?Vitals:  ? 04/24/21 0754 04/24/21 1255  ?BP: (!) 112/95 116/88  ?Pulse: (!) 103 98  ?Resp: 19 20  ?Temp: 98.1 ?F (36.7  ?C) 98.3 ?F (36.8 ?C)  ?SpO2: 99% 98%  ? ? ?Tele box removed and returned. Skin clean, dry and intact without evidence of skin break down, no evidence of skin tears noted. IV catheter discontinued intact. Site without signs and symptoms of complications. Dressing and pressure applied. Pt denies pain at this time. No complaints noted. ? ?An After Visit Summary was printed and given to the patient. ?Patient escorted via [place], and D/C home via private auto. ? ?Rolley Sims  ?

## 2021-04-24 NOTE — Plan of Care (Signed)

## 2021-04-24 NOTE — Progress Notes (Signed)
PHARMACY CONSULT NOTE ? ?Pharmacy Consult for Electrolyte Monitoring and Replacement  ? ?Recent Labs: ?Potassium (mmol/L)  ?Date Value  ?04/24/2021 4.1  ? ?Magnesium (mg/dL)  ?Date Value  ?04/24/2021 2.7 (H)  ? ?Calcium (mg/dL)  ?Date Value  ?04/24/2021 8.7 (L)  ? ?Albumin (g/dL)  ?Date Value  ?04/24/2021 3.6  ? ?Phosphorus (mg/dL)  ?Date Value  ?04/24/2021 4.1  ? ?Sodium (mmol/L)  ?Date Value  ?04/24/2021 139  ?10/13/2018 141  ? ? ?Assessment: 61 y.o female with significant PMH of mixed ischemic and nonischemic ischemic cardiomyopathy, CAD, alcoholic cirrhosis with ascites, hyperlipidemia, COPD, CKD stage III, combined systolic and diastolic CHF persistent atrial fibrillation on Eliquis, GERD, bipolar disorder, anxiety and depression, RLS, type 2 diabetes mellitus, thyroid disorder, tobacco abuse, EtOH abuse who presented to the ED with chief complaints of diarrhea, nausea, vomiting, abdominal pain, fever x 3 days.  ? ?Goal of Therapy:  ?Potassium 4.0 - 5.1 mmol/L ?Magnesium 2.0 - 2.4 mg/dL ?All Other Electrolytes WNL ? ?Plan:  ?Electrolytes stable/WNL today. Scr stable as well. Patient transferred to 2A last night with last Mg and K replacement ordered >3 days ago.  ?K4.2>4.3>4.1: No replacement needed ?Mg 2.6>2.6>2.7 No further replacement ?Phos , and Na within normal limits ?Will d/c CCM electrolytes replacement consult per protocol ? ?Exander Shaul Rodriguez-Guzman PharmD, BCPS ?04/24/2021 8:06 AM ? ? ?
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
## 2021-04-24 NOTE — Progress Notes (Signed)
? ?Cardiology Progress Note  ? ?Patient Name: Ariana Riggs ?Date of Encounter: 04/24/2021 ? ?Primary Cardiologist: Nelva Bush, MD ? ?Subjective  ? ?No chest pain, dyspnea, or palpitations.  Eager to go home.  Coughing less. ? ?Inpatient Medications  ?  ?Scheduled Meds: ? allopurinol  300 mg Oral Daily  ? apixaban  5 mg Oral BID  ? atorvastatin  10 mg Oral Daily  ? Chlorhexidine Gluconate Cloth  6 each Topical Daily  ? citalopram  20 mg Oral Daily  ? digoxin  0.125 mg Oral Daily  ? folic acid  1 mg Oral Daily  ? hydrocortisone sod succinate (SOLU-CORTEF) inj  100 mg Intravenous Q8H  ? insulin aspart  0-15 Units Subcutaneous Q4H  ? mouth rinse  15 mL Mouth Rinse BID  ? methimazole  20 mg Oral BID  ? multivitamin with minerals  1 tablet Oral Daily  ? PHENobarbital  64.8 mg Oral Q8H  ? Followed by  ? [START ON 04/25/2021] phenobarbital  32.4 mg Oral Q8H  ? [START ON 04/25/2021] predniSONE  60 mg Oral Q breakfast  ? propranolol ER  160 mg Oral BID  ? Suvorexant  20 mg Oral QHS  ? thiamine  100 mg Oral Daily  ? traZODone  50 mg Oral QHS  ? ?Continuous Infusions: ? sodium chloride Stopped (04/21/21 0958)  ? famotidine (PEPCID) IV 20 mg (04/24/21 0910)  ? ?PRN Meds: ?sodium chloride, colchicine, docusate sodium, LORazepam, polyethylene glycol  ? ?Vital Signs  ?  ?Vitals:  ? 04/23/21 2009 04/23/21 2344 04/24/21 5974 04/24/21 0754  ?BP: 118/81 101/73 111/81 (!) 112/95  ?Pulse: 88 (!) 109 99 (!) 103  ?Resp: '20 20 19 19  '$ ?Temp: 98.5 ?F (36.9 ?C) (!) 97.4 ?F (36.3 ?C) 97.9 ?F (36.6 ?C) 98.1 ?F (36.7 ?C)  ?TempSrc: Oral Oral Oral Oral  ?SpO2: 96% 95% 96% 99%  ?Weight:      ?Height:      ? ? ?Intake/Output Summary (Last 24 hours) at 04/24/2021 1214 ?Last data filed at 04/24/2021 0700 ?Gross per 24 hour  ?Intake 610 ml  ?Output 0 ml  ?Net 610 ml  ? ?Filed Weights  ? 04/20/21 0500 04/21/21 0400 04/22/21 0316  ?Weight: 77.8 kg 74.2 kg 78.9 kg  ? ? ?Physical Exam  ? ?GEN: Well nourished, well developed, in no acute distress.   ?HEENT: Grossly normal.  ?Neck: Supple, no JVD, carotid bruits, or masses. ?Cardiac: RRR, no murmurs, rubs, or gallops. No clubbing, cyanosis, edema.  Radials 2+, DP/PT 2+ and equal bilaterally.  ?Respiratory:  Respirations regular and unlabored, clear to auscultation bilaterally. ?GI: Soft, nontender, nondistended, BS + x 4. ?MS: no deformity or atrophy. ?Skin: warm and dry, no rash. ?Neuro:  Strength and sensation are intact. ?Psych: AAOx3.  Normal affect. ? ?Labs  ?  ?Chemistry ?Recent Labs  ?Lab 04/19/21 ?1941 04/20/21 ?0211 04/22/21 ?0450 04/23/21 ?1638 04/24/21 ?0431  ?NA 139   < > 138 139 139  ?K 4.1   < > 4.3 4.1 4.1  ?CL 105   < > 109 108 111  ?CO2 24   < > 20* 19* 22  ?GLUCOSE 100*   < > 126* 128* 98  ?BUN 14   < > 29* 26* 26*  ?CREATININE 0.71   < > 0.90 0.80 0.87  ?CALCIUM 9.1   < > 8.9 8.9 8.7*  ?PROT 7.5  --   --  6.3* 6.4*  ?ALBUMIN 4.0  --   --  3.6  3.6  ?AST 21  --   --  17 17  ?ALT 15  --   --  15 14  ?ALKPHOS 128*  --   --  113 110  ?BILITOT 1.2  --   --  0.8 0.7  ?GFRNONAA >60   < > >60 >60 >60  ?ANIONGAP 10   < > '9 12 6  '$ ? < > = values in this interval not displayed.  ?  ? ?Hematology ?Recent Labs  ?Lab 04/22/21 ?0450 04/23/21 ?6979 04/24/21 ?0431  ?WBC 6.3 3.9* 4.3  ?RBC 4.12 4.30 4.30  ?HGB 11.9* 12.2 12.5  ?HCT 36.0 38.5 38.3  ?MCV 87.4 89.5 89.1  ?MCH 28.9 28.4 29.1  ?MCHC 33.1 31.7 32.6  ?RDW 15.2 15.2 15.3  ?PLT 120* 122* 143*  ? ? ?Cardiac Enzymes  ?Recent Labs  ?Lab 04/20/21 ?0211 04/20/21 ?0849  ?TROPONINIHS 8 7  ?   ? ?BNPBNP ?   ?Component Value Date/Time  ? BNP 423.5 (H) 04/19/2021 1941  ? ? ?Lipids  ?Lab Results  ?Component Value Date  ? CHOL 116 06/16/2019  ? HDL 32 (L) 06/16/2019  ? Parkman 38 06/16/2019  ? TRIG 231 (H) 06/16/2019  ? CHOLHDL 3.6 06/16/2019  ? ? ?HbA1c  ?Lab Results  ?Component Value Date  ? HGBA1C 5.4 04/20/2021  ? ? ?Radiology  ? ?--------------------- ? ?Telemetry  ?  ?Afib, 105-115 - Personally Reviewed ? ?Cardiac Studies  ? ?Cardiac cath 11/2020 ?Conclusion ?   ?Conclusion: ?Nonobstructive coronary artery disease with 50-60% proximal LCx stenosis that is not hemodynamically significant (iFR 1.0) and mild plaquing in the proximal RCA. ?Widely patent mid/distal LCx stent. ?Mildly reduced left ventricular systolic function (EF 48-01%) with global hypokinesis and mildly elevated filling pressure (LVEDP 15-20 mmHg). ?  ?Recommendations: ?Continue secondary prevention of coronary artery disease. ?If no evidence of bleeding or vascular injury at right radial arteriotomy site, anticipate restarting apixaban 5 mg twice daily tomorrow morning.  Defer aspirin in the setting of long-term apixaban use. ?Escalate goal-directed medical therapy for treatment of nonischemic cardiomyopathy, as tolerated. ?Consider placement of ambulatory cardiac monitor at follow-up visit to assess for paroxysmal atrial fibrillation leading to intermittent chest pain. ?_____________  ?  ?Echo 04/20/21 ? 1. Left ventricular ejection fraction, by estimation, is 35 to 40%. The  ?left ventricle has moderately decreased function. The left ventricle  ?demonstrates global hypokinesis. The left ventricular internal cavity size  ?was mildly dilated. Left ventricular  ?diastolic parameters are indeterminate.  ? 2. Right ventricular systolic function is normal. The right ventricular  ?size is mildly enlarged. There is normal pulmonary artery systolic  ?pressure.  ? 3. Left atrial size was severely dilated.  ? 4. Right atrial size was moderately dilated.  ? 5. The mitral valve is normal in structure. Moderate mitral valve  ?regurgitation. No evidence of mitral stenosis.  ? 6. Tricuspid valve regurgitation is moderate to severe.  ? 7. The aortic valve is normal in structure. Aortic valve regurgitation is  ?not visualized. Aortic valve sclerosis/calcification is present, without  ?any evidence of aortic stenosis.  ?_____________  ? ?Patient Profile  ?   ?61 y.o. female with a hx of mixed ischemic and nonischemic  cardiomyopathy, heart failure with improved ejection fraction, CAD status post circumflex stenting in May 2021, polysubstance abuse, abdominal ascites, hyperlipidemia, tobacco abuse, COPD, depression, GERD, bipolar disorder, persistent A-fib on amiodarone and Eliquis who was admitted 3/5 w/ thyroid storm and afib w/ RVR. ? ?Assessment & Plan  ?  ?  1.  Persistent atrial fibrillation with RVR: Rapid A-fib in the setting of thyroid storm.  Previously on amiodarone which was discontinued in December 2022.  Switched from short-acting to long-acting propranolol March 9, and tolerating well.  Also remains on digoxin 0.125 mg daily.  Digoxin level is pending this morning.  Rates low 100s and asymptomatic.  Continue current doses of beta-blocker and digoxin.  Continue Eliquis.  Once thyroid status improved, we can look to use alternate antiarrhythmic drug and proceed with cardioversion if necessary.  With reduced EF, antiarrhythmic drug options likely limited to Tikosyn.  She has follow-up in our clinic with me next week. ? ?2.  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  TSH less than 0.010, free T4 2.02.  Management per medicine team. ? ?3.  Cardiomyopathy: EF 35 to 40% by echo in the setting of rapid atrial fibrillation.  She remains euvolemic on examination.  Now on long-acting propranolol.  Soft blood pressures otherwise limiting guideline directed medical therapy, though this can be reassessed in the outpatient setting.  Following restoration of sinus rhythm at some point in the future, will require repeat echo. ? ?4.  Coronary artery disease: Status post circumflex stenting in May 2021.  Patent circumflex stent by catheterization October 2022 with otherwise nonobstructive disease.  No aspirin in the setting of Eliquis.  Continue beta-blocker and atorvastatin. ? ?5.  EtOH abuse: Quit drinking 2 weeks ago. ? ?6.  Tobacco abuse: Was smoking up until admission.  Says she is done with  cigarettes. ? ?Signed, ?Murray Hodgkins, NP  ?04/24/2021, 12:14 PM   ? ?For questions or updates, please contact   ?Please consult [email or web address] for contact info under Cardiology/STEMI.  ?

## 2021-04-24 NOTE — TOC CM/SW Note (Addendum)
CSW acknowledges consult for medication assistance. Patient has insurance so will be unable to get prescriptions from [facility]. If there is a more specific need, please consult again. ? ?Dayton Scrape, [place] ?[phone] ? ?2:08 pm: Patient has orders to discharge home today. Chart reviewed. PCP is Gaetano Net, NP. On room air. No wounds. No TOC needs identified. CSW signing off. ? ?Dayton Scrape, [place]cee ?[phone] ? ?

## 2021-04-25 LAB — CULTURE, BLOOD (ROUTINE X 2)
Culture: NO GROWTH
Special Requests: ADEQUATE

## 2021-04-25 NOTE — Discharge Summary (Signed)
Physician Discharge Summary   Patient: Ariana Riggs MRN: [id number] DOB: October 27, 1960  Admit date:     04/19/2021  Discharge date: 04/24/2021  Discharge Physician: Annita Brod   PCP: Sallee Lange, NP   Recommendations at discharge:   New medication: Tapazole 20 mg p.o. twice daily New medication: Prednisone taper starting at 60 mg and then decreasing by 10 mg every 3 days New medication: Propranolol 160 mg p.o. twice daily, this will be in place of her metoprolol  Discharge Diagnoses: Principal Problem:   Hyperthyroidism with storm Active Problems:   Atrial fibrillation with rapid ventricular response (HCC)   HFrEF (heart failure with reduced ejection fraction) (HCC)   Alcohol abuse   Hepatic cirrhosis (HCC)   COPD (chronic obstructive pulmonary disease) ([place])   Norovirus   Bipolar disorder ([place])   Overweight (BMI 25.0-29.9)  Resolved Problems:   * No resolved hospital problems. *  Hospital Course: 61 year old female with past medical history of alcohol cirrhosis with ascites, COPD, atrial fibrillation on Eliquis chronic systolic/diastolic heart failure, CAD and diabetes mellitus presented to the emergency room on 3/5 with complaints of nausea vomiting, diarrhea and abdominal pain x3 days and initially thought to be in septic shock secondary to colitis.  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  Sepsis was ruled out.  Over next few days, able to be weaned off of pressor support.  Hospital course was complicated by alcohol withdrawal requiring phenobarbital therapy as well as atrial fibrillation with rapid ventricular rate.    Assessment and Plan: * Hyperthyroidism with storm Started on PTU, SSKI, propranolol and hydrocortisone.  PTU  converted over to methimazole.  Hydrocortisone converted to p.o. prednisone with taper.  Patient will be continued on beta-blocker.  Once thyroid evens out, beta-blocker can be weaned off.  Atrial fibrillation with rapid ventricular response [facility]) Cardiology on board.  Amiodarone discontinued.  Continue propranolol and digoxin and Eliquis.  Propranolol in place of her home metoprolol.  Secondary to hyperthyroidism as above.  HFrEF (heart failure with reduced ejection fraction) ([place]) Cardiac cath in 2021 noted ejection fraction of 35-40%.  Currently euvolemic.  Once heart rate is under control, will check repeat echocardiogram.  Cardiology following.  Alcohol abuse Started on phenobarbital, although her last drink was 2 weeks ago.  I do not think that she has alcohol withdrawal rather her symptoms are more due to thyroid storm.  Prior to discharge, patient had 2 more days of phenobarbital taper.  Discussed with pharmacy and since she normally takes Ativan twice daily which she had not been getting during the hospital, no need to further continue phenobarbital and she just needs to resume her Ativan at home.  Hepatic cirrhosis (HCC) Stable  COPD (chronic obstructive pulmonary disease) (HCC) Stable.  Norovirus Incidental.  Stable.  Overweight (BMI 25.0-29.9) Meets criteria BMI greater than 25         Consultants: Cardiology, critical care Procedures performed: Echocardiogram noting ejection fraction of 35 to 40% Disposition: Home Diet recommendation:  Discharge Diet Orders (From admission, onward)     Start     Ordered   04/24/21 0000  Diet - low sodium heart healthy        04/24/21 1404           Cardiac diet DISCHARGE MEDICATION: Allergies as  of 04/24/2021       Reactions   Melatonin Hives   Doxycycline Nausea And Vomiting   Erythromycin Nausea And Vomiting   Paxil [paroxetine Hcl] Hives   Penicillins Hives   Sulfa Antibiotics Hives        Medication List      STOP taking these medications    metoprolol succinate 25 MG 24 hr tablet Commonly known as: TOPROL-XL       TAKE these medications    albuterol 108 (90 Base) MCG/ACT inhaler Commonly known as: VENTOLIN HFA Inhale 2 puffs into the lungs every 6 (six) hours as needed for wheezing or shortness of breath.   allopurinol 300 MG tablet Commonly known as: ZYLOPRIM Take 300 mg by mouth daily.   apixaban 5 MG Tabs tablet Commonly known as: ELIQUIS Take 1 tablet (5 mg total) by mouth 2 (two) times daily.   atorvastatin 10 MG tablet Commonly known as: LIPITOR TAKE 1 TABLET BY MOUTH EVERY DAY   Belsomra 20 MG Tabs Generic drug: Suvorexant Take 20 mg by mouth at bedtime.   citalopram 20 MG tablet Commonly known as: CELEXA Take 20 mg by mouth daily.   Clear Eyes for Dry Eyes 1-0.25 % Soln Generic drug: Carboxymethylcellul-Glycerin Place 1 drop into both eyes daily.   colchicine 0.6 MG tablet Take 0.6 mg by mouth 2 (two) times daily as needed (Gout).   digoxin 0.125 MG tablet Commonly known as: LANOXIN Take 1 tablet (0.125 mg total) by mouth daily.   fluticasone 50 MCG/ACT nasal spray Commonly known as: FLONASE Place 2 sprays into both nostrils daily as needed for allergies.   furosemide 40 MG tablet Commonly known as: LASIX Take 1 tablet (40 mg total) by mouth 2 (two) times daily.   lansoprazole 30 MG capsule Commonly known as: PREVACID Take 30 mg by mouth daily at 12 noon.   Lantus SoloStar 100 UNIT/ML Solostar Pen Generic drug: insulin glargine Inject 5 Units into the skin at bedtime.   LORazepam 1 MG tablet Commonly known as: ATIVAN Take 1 mg by mouth 2 (two) times daily.   losartan 25 MG tablet Commonly known as: COZAAR TAKE 1/2 TABLET BY MOUTH EVERY DAY   methimazole 10 MG tablet Commonly known as: TAPAZOLE Take 2 tablets (20 mg total) by mouth 2 (two) times daily.   montelukast 10 MG tablet Commonly known as: SINGULAIR Take 10 mg by mouth at  bedtime.   nitroGLYCERIN 0.4 MG SL tablet Commonly known as: NITROSTAT Place 1 tablet (0.4 mg total) under the tongue every 5 (five) minutes as needed for chest pain.   potassium chloride SA 20 MEQ tablet Commonly known as: KLOR-CON M Take 1 tablet by mouth 2 (two) times daily.   predniSONE 20 MG tablet Commonly known as: DELTASONE Take 3 tablets (60 mg total) by mouth daily with breakfast for 3 days, THEN 2.5 tablets (50 mg total) daily with breakfast for 3 days, THEN 2 tablets (40 mg total) daily with breakfast for 3 days, THEN 1.5 tablets (30 mg total) daily with breakfast for 3 days, THEN 1 tablet (20 mg total) daily with breakfast for 3 days, THEN 0.5 tablets (10 mg total) daily with breakfast for 4 days. Start taking on: April 25, 2021   propranolol ER 160 MG SR capsule Commonly known as: INDERAL LA Take 1 capsule (160 mg total) by mouth 2 (two) times daily.   spironolactone 25 MG tablet Commonly known as: ALDACTONE Take 1 tablet (25 mg total) by  mouth daily.   traZODone 50 MG tablet Commonly known as: DESYREL Take 50 mg by mouth at bedtime.   umeclidinium-vilanterol 62.5-25 MCG/INH Aepb Commonly known as: ANORO ELLIPTA Inhale 1 puff into the lungs daily.   Xifaxan 550 MG Tabs tablet Generic drug: rifaximin Take 550 mg by mouth 2 (two) times daily.        Discharge Exam: Filed Weights   04/20/21 0500 04/21/21 0400 04/22/21 0316  Weight: 77.8 kg 74.2 kg 78.9 kg   General: Alert and oriented x3, no acute distress Cardiovascular: Irregular rhythm, rate controlled Lungs: Clear to auscultation bilaterally  Condition at discharge: good  The results of significant diagnostics from this hospitalization (including imaging, microbiology, ancillary and laboratory) are listed below for reference.   Imaging Studies: CT ABDOMEN PELVIS W CONTRAST  Result Date: 04/19/2021 CLINICAL DATA:  Acute abdominal pain on the right, initial encounter EXAM: CT ABDOMEN AND PELVIS WITH  CONTRAST TECHNIQUE: Multidetector CT imaging of the abdomen and pelvis was performed using the standard protocol following bolus administration of intravenous contrast. RADIATION DOSE REDUCTION: This exam was performed according to the departmental dose-optimization program which includes automated exposure control, adjustment of the mA and/or kV according to patient size and/or use of iterative reconstruction technique. CONTRAST:  160m OMNIPAQUE IOHEXOL 300 MG/ML  SOLN COMPARISON:  Ultrasound from 03/16/2021 FINDINGS: Lower chest: No acute abnormality. Hepatobiliary: Gallbladder has been surgically removed. Liver demonstrates some heterogeneity without discrete mass. Prominence of the caudate lobe is noted as well consistent with cirrhotic change. This is likely the etiology of the heterogeneity. Pancreas: Unremarkable. No pancreatic ductal dilatation or surrounding inflammatory changes. Spleen: Spleen is prominent and stable from prior exams. Adrenals/Urinary Tract: Adrenal glands are within normal limits. Kidneys are well visualize within normal enhancement pattern. No renal mass is seen. No calculi or obstructive changes are noted. Ureters are within normal limits. Bladder is partially distended. Stomach/Bowel: No obstructive or inflammatory changes of the colon are seen. Mild diverticular changes noted. Fluid is noted within the colon and small bowel which may be related to an underlying diarrheal state. No inflammatory changes are noted. The stomach is within normal limits. Vascular/Lymphatic: Aortic atherosclerosis. No enlarged abdominal or pelvic lymph nodes. Reproductive: Uterus and bilateral adnexa are unremarkable. Other: Mild ascites is noted likely related to the underlying cirrhosis. Musculoskeletal: Degenerative changes of lumbar spine are noted. IMPRESSION: Changes consistent with cirrhosis of the liver with associated splenomegaly and mild ascites. The cirrhosis likely contributes to the  heterogeneity of the liver. No discrete mass is noted. Fluid within the colon and small bowel which may be related to a diarrheal state. Clinical correlation is recommended. No other focal abnormality is noted. Electronically Signed   By: MInez CatalinaM.D.   On: 04/19/2021 21:10   DG Chest Port 1 View  Result Date: 04/19/2021 CLINICAL DATA:  Right-sided chest and abdominal pain, initial encounter EXAM: PORTABLE CHEST 1 VIEW COMPARISON:  02/16/2021 FINDINGS: Cardiac shadow is mildly prominent but stable. Postsurgical changes are again seen. The lungs are well aerated bilaterally. No acute bony abnormality is noted. IMPRESSION: No active disease. Electronically Signed   By: MInez CatalinaM.D.   On: 04/19/2021 19:52   ECHOCARDIOGRAM COMPLETE  Result Date: 04/20/2021    ECHOCARDIOGRAM REPORT   Patient Name:   CEIRA ALPERTDate of Exam: 04/20/2021 Medical Rec #:  [id number]         Height:       66.0 in Accession #:  [id number]         Weight:       171.5 lb Date of Birth:  07-20-1960          BSA:          1.873 m Patient Age:    30 years           BP:           91/69 mmHg Patient Gender: F                  HR:           115 bpm. Exam Location:  [facility] Procedure: 2D Echo, Cardiac Doppler and Color Doppler Indications:     Abnormal ECG R94.31  History:         Patient has prior history of Echocardiogram examinations, most                  recent 06/16/2019. CAD, COPD; Risk Factors:Diabetes.  Sonographer:     Sherrie Sport Referring Phys:  [id number] Teressa Lower Diagnosing Phys: Kathlyn Sacramento MD IMPRESSIONS  1. Left ventricular ejection fraction, by estimation, is 35 to 40%. The left ventricle has moderately decreased function. The left ventricle demonstrates global hypokinesis. The left ventricular internal cavity size was mildly dilated. Left ventricular diastolic parameters are indeterminate.  2. Right ventricular systolic function is normal. The right ventricular size is mildly enlarged. There is normal pulmonary  artery systolic pressure.  3. Left atrial size was severely dilated.  4. Right atrial size was moderately dilated.  5. The mitral valve is normal in structure. Moderate mitral valve regurgitation. No evidence of mitral stenosis.  6. Tricuspid valve regurgitation is moderate to severe.  7. The aortic valve is normal in structure. Aortic valve regurgitation is not visualized. Aortic valve sclerosis/calcification is present, without any evidence of aortic stenosis. FINDINGS  Left Ventricle: Left ventricular ejection fraction, by estimation, is 35 to 40%. The left ventricle has moderately decreased function. The left ventricle demonstrates global hypokinesis. The left ventricular internal cavity size was mildly dilated. There is no left ventricular hypertrophy. Left ventricular diastolic parameters are indeterminate. Right Ventricle: The right ventricular size is mildly enlarged. No increase in right ventricular wall thickness. Right ventricular systolic function is normal. There is normal pulmonary artery systolic pressure. The tricuspid regurgitant velocity is 2.71  m/s, and with an assumed right atrial pressure of 5 mmHg, the estimated right ventricular systolic pressure is 66.0 mmHg. Left Atrium: Left atrial size was severely dilated. Right Atrium: Right atrial size was moderately dilated. Pericardium: There is no evidence of pericardial effusion. Mitral Valve: The mitral valve is normal in structure. Moderate mitral valve regurgitation, with posteriorly-directed jet. No evidence of mitral valve stenosis. Tricuspid Valve: The tricuspid valve is normal in structure. Tricuspid valve regurgitation is moderate to severe. No evidence of tricuspid stenosis. Aortic Valve: The aortic valve is normal in structure. Aortic valve regurgitation is not visualized. Aortic valve sclerosis/calcification is present, without any evidence of aortic stenosis. Aortic valve mean gradient measures 2.0 mmHg. Aortic valve peak  gradient  measures 3.4 mmHg. Aortic valve area, by VTI measures 2.72 cm. Pulmonic Valve: The pulmonic valve was normal in structure. Pulmonic valve regurgitation is not visualized. No evidence of pulmonic stenosis. Aorta: The aortic root is normal in size and structure. Venous: The inferior vena cava was not well visualized. IAS/Shunts: No atrial level shunt detected by color flow Doppler.  LEFT VENTRICLE PLAX 2D LVIDd:  5.10 cm LVIDs:         3.70 cm LV PW:         1.20 cm LV IVS:        0.85 cm LVOT diam:     2.00 cm LV SV:         37 LV SV Index:   20 LVOT Area:     3.14 cm  RIGHT VENTRICLE RV Basal diam:  4.60 cm RV S prime:     9.03 cm/s TAPSE (M-mode): 1.2 cm LEFT ATRIUM              Index        RIGHT ATRIUM           Index LA diam:        6.10 cm  3.26 cm/m   RA Area:     23.70 cm LA Vol (A2C):   113.0 ml 60.32 ml/m  RA Volume:   78.30 ml  41.80 ml/m LA Vol (A4C):   91.0 ml  48.57 ml/m LA Biplane Vol: 101.0 ml 53.91 ml/m  AORTIC VALVE                    PULMONIC VALVE AV Area (Vmax):    2.17 cm     PV Vmax:        0.61 m/s AV Area (Vmean):   1.97 cm     PV Vmean:       39.850 cm/s AV Area (VTI):     2.72 cm     PV VTI:         0.108 m AV Vmax:           91.80 cm/s   PV Peak grad:   1.5 mmHg AV Vmean:          68.500 cm/s  PV Mean grad:   1.0 mmHg AV VTI:            0.135 m      RVOT Peak grad: 2 mmHg AV Peak Grad:      3.4 mmHg AV Mean Grad:      2.0 mmHg LVOT Vmax:         63.30 cm/s LVOT Vmean:        42.900 cm/s LVOT VTI:          0.117 m LVOT/AV VTI ratio: 0.87  AORTA Ao Root diam: 3.10 cm MITRAL VALVE                TRICUSPID VALVE MV Area (PHT): 6.12 cm     TR Peak grad:   29.4 mmHg MV Decel Time: 124 msec     TR Vmax:        271.00 cm/s MV E velocity: 129.00 cm/s                             SHUNTS                             Systemic VTI:  0.12 m                             Systemic Diam: 2.00 cm                             Pulmonic VTI:  0.109 m Kathlyn Sacramento MD Electronically signed by  Kathlyn Sacramento MD Signature Date/Time: 04/20/2021/5:56:08 PM    Final    US Abdomen Limited RUQ (LIVER/GB)  Result Date: 04/20/2021 CLINICAL DATA:  Right upper quadrant pain EXAM: ULTRASOUND ABDOMEN LIMITED RIGHT UPPER QUADRANT COMPARISON:  CT from the previous day. FINDINGS: Gallbladder: Surgically removed Common bile duct: Diameter: 4.9 mm Liver: Increased in echogenicity consistent with the given clinical history of cirrhosis. Mild nodularity is noted. No focal mass is noted. Portal vein is patent on color Doppler imaging with normal direction of blood flow towards the liver. Other: Mild ascites IMPRESSION: Changes of cirrhosis with mild ascites. Status post cholecystectomy. Electronically Signed   By: Inez Catalina M.D.   On: 04/20/2021 03:14    Microbiology: Results for orders placed or performed during the hospital encounter of 04/19/21  Culture, blood (Routine x 2)     Status: None   Collection Time: 04/19/21  7:41 PM   Specimen: BLOOD  Result Value Ref Range Status   Specimen Description BLOOD RIGHT ANTECUBITAL  Final   Special Requests   Final    BOTTLES DRAWN AEROBIC AND ANAEROBIC Blood Culture adequate volume   Culture   Final    NO GROWTH 5 DAYS Performed at [facility], [address] [place], [place] [address]    Report Status 04/24/2021 FINAL  Final  Resp Panel by RT-PCR (Flu A&B, Covid) Nasopharyngeal Swab     Status: None   Collection Time: 04/19/21 11:48 PM   Specimen: Nasopharyngeal Swab; Nasopharyngeal(NP) swabs in vial transport medium  Result Value Ref Range Status   SARS Coronavirus 2 by RT PCR NEGATIVE NEGATIVE Final    Comment: (NOTE) SARS-CoV-2 target nucleic acids are NOT DETECTED.  The SARS-CoV-2 RNA is generally detectable in upper respiratory specimens during the acute phase of infection. The lowest concentration of SARS-CoV-2 viral copies this assay can detect is 138 copies/mL. A negative result does not preclude SARS-Cov-2 infection and should  not be used as the sole basis for treatment or other patient management decisions. A negative result may occur with  improper specimen collection/handling, submission of specimen other than nasopharyngeal swab, presence of viral mutation(s) within the areas targeted by this assay, and inadequate number of viral copies(<138 copies/mL). A negative result must be combined with clinical observations, patient history, and epidemiological information. The expected result is Negative.  Fact Sheet for Patients:  EntrepreneurPulse.com.au  Fact Sheet for Healthcare Providers:  IncredibleEmployment.be  This test is no t yet approved or cleared by the [place] FDA and  has been authorized for detection and/or diagnosis of SARS-CoV-2 by FDA under an Emergency Use Authorization (EUA). This EUA will remain  in effect (meaning this test can be used) for the duration of the COVID-19 declaration under Section 564(b)(1) of the Act, 21 U.S.C.section 360bbb-3(b)(1), unless the authorization is terminated  or revoked sooner.       Influenza A by PCR NEGATIVE NEGATIVE Final   Influenza B by PCR NEGATIVE NEGATIVE Final    Comment: (NOTE) The Xpert Xpress SARS-CoV-2/FLU/RSV plus assay is intended as an aid in the diagnosis of influenza from Nasopharyngeal swab specimens and should not be used as a sole basis for treatment. Nasal washings and aspirates are unacceptable for Xpert Xpress SARS-CoV-2/FLU/RSV testing.  Fact Sheet for Patients: EntrepreneurPulse.com.au  Fact Sheet for Healthcare Providers: IncredibleEmployment.be  This test is not yet approved or cleared by the [place] FDA and has been authorized for detection and/or diagnosis  of SARS-CoV-2 by FDA under an Emergency Use Authorization (EUA). This EUA will remain in effect (meaning this test can be used) for the duration of the COVID-19 declaration under  Section 564(b)(1) of the Act, 21 U.S.C. section 360bbb-3(b)(1), unless the authorization is terminated or revoked.  Performed at [facility], [address] [place], [place] [address]   MRSA Next Gen by PCR, Nasal     Status: None   Collection Time: 04/20/21  1:38 AM   Specimen: Nasal Mucosa; Nasal Swab  Result Value Ref Range Status   MRSA by PCR Next Gen NOT DETECTED NOT DETECTED Final    Comment: (NOTE) The GeneXpert MRSA Assay (FDA approved for NASAL specimens only), is one component of a comprehensive MRSA colonization surveillance program. It is not intended to diagnose MRSA infection nor to guide or monitor treatment for MRSA infections. Test performance is not FDA approved in patients less than 39 years old. Performed at [facility], [address] [place], [place] [address]   Culture, blood (Routine X 2) w Reflex to ID Panel     Status: None   Collection Time: 04/20/21  2:11 AM   Specimen: BLOOD LEFT HAND  Result Value Ref Range Status   Specimen Description BLOOD LEFT HAND  Final   Special Requests   Final    BOTTLES DRAWN AEROBIC AND ANAEROBIC Blood Culture adequate volume   Culture   Final    NO GROWTH 5 DAYS Performed at [facility], [address] [place], [place] [address]    Report Status 04/25/2021 FINAL  Final    Labs: CBC: Recent Labs  Lab 04/19/21 1941 04/20/21 0211 04/21/21 0440 04/22/21 0450 04/23/21 0535 04/24/21 0431  WBC 4.3 4.5 4.9 6.3 3.9* 4.3  NEUTROABS 3.4  --  3.7  --   --   --   HGB 12.3 11.3* 11.8* 11.9* 12.2 12.5  HCT 37.0 34.3* 36.1 36.0 38.5 38.3  MCV 86.7 88.9 87.2 87.4 89.5 89.1  PLT 120* 123* 120* 120* 122* 497*   Basic Metabolic Panel: Recent Labs  Lab 04/20/21 0211 04/21/21 0440 04/22/21 0450 04/23/21 0535 04/24/21 0431  NA 137 137 138 139 139  K 3.8 4.2 4.3 4.1 4.1  CL 106 106 109 108 111  CO2 23 21* 20* 19* 22  GLUCOSE 108* 148* 126* 128* 98  BUN 14 21 29* 26* 26*   CREATININE 0.68 0.69 0.90 0.80 0.87  CALCIUM 8.5* 8.9 8.9 8.9 8.7*  MG 1.9 2.6* 2.6* 2.5* 2.7*  PHOS 3.8 3.7 4.1 3.9 4.1   Liver Function Tests: Recent Labs  Lab 04/19/21 1941 04/23/21 0535 04/24/21 0431  AST '21 17 17  '$ ALT '15 15 14  '$ ALKPHOS 128* 113 110  BILITOT 1.2 0.8 0.7  PROT 7.5 6.3* 6.4*  ALBUMIN 4.0 3.6 3.6   CBG: Recent Labs  Lab 04/23/21 1626 04/23/21 2008 04/23/21 2345 04/24/21 0427 04/24/21 0754  GLUCAP 126* 147* 113* 103* 118*    Discharge time spent: greater than 30 minutes.  Signed: Annita Brod, MD [facility] 04/25/2021

## 2021-04-30 ENCOUNTER — Other Ambulatory Visit: Payer: Self-pay

## 2021-04-30 ENCOUNTER — Other Ambulatory Visit
Admission: RE | Admit: 2021-04-30 | Discharge: 2021-04-30 | Disposition: A | Discharge status: Home / Self Care | Payer: 59 | Source: Ambulatory Visit | Attending: Nurse Practitioner | Admitting: Nurse Practitioner

## 2021-04-30 ENCOUNTER — Ambulatory Visit (INDEPENDENT_AMBULATORY_CARE_PROVIDER_SITE_OTHER): Payer: 59 | Admitting: Nurse Practitioner

## 2021-04-30 ENCOUNTER — Encounter: Payer: Self-pay | Admitting: Nurse Practitioner

## 2021-04-30 VITALS — BP 100/60 | HR 87 | Ht 65.0 in | Wt 174.0 lb

## 2021-04-30 DIAGNOSIS — I480 Paroxysmal atrial fibrillation: Secondary | ICD-10-CM | POA: Diagnosis present

## 2021-04-30 DIAGNOSIS — I2510 Atherosclerotic heart disease of native coronary artery without angina pectoris: Secondary | ICD-10-CM | POA: Insufficient documentation

## 2021-04-30 DIAGNOSIS — E0591 Thyrotoxicosis, unspecified with thyrotoxic crisis or storm: Secondary | ICD-10-CM | POA: Diagnosis not present

## 2021-04-30 DIAGNOSIS — I4819 Other persistent atrial fibrillation: Secondary | ICD-10-CM | POA: Diagnosis not present

## 2021-04-30 DIAGNOSIS — I420 Dilated cardiomyopathy: Secondary | ICD-10-CM | POA: Diagnosis not present

## 2021-04-30 LAB — DIGOXIN LEVEL: Digoxin Level: 0.9 ng/mL (ref 0.8–2.0)

## 2021-04-30 MED ORDER — SPIRONOLACTONE 25 MG PO TABS: 25.0000 mg | ORAL_TABLET | Freq: Every day | ORAL | 3 refills | Status: DC

## 2021-04-30 MED ORDER — PROPRANOLOL HCL ER 160 MG PO CP24: 160.0000 mg | ORAL_CAPSULE | Freq: Two times a day (BID) | ORAL | 3 refills | Status: DC

## 2021-04-30 MED ORDER — LOSARTAN POTASSIUM 25 MG PO TABS: 25.0000 mg | ORAL_TABLET | Freq: Every day | ORAL | 3 refills | Status: DC

## 2021-04-30 MED ORDER — DIGOXIN 125 MCG PO TABS: 0.1250 mg | ORAL_TABLET | Freq: Every day | ORAL | 3 refills | Status: DC

## 2021-04-30 NOTE — Patient Instructions (Signed)
Medication Instructions:  ?No changes at this time.  ? ?*If you need a refill on your cardiac medications before your next appointment, please call your pharmacy* ? ? ?Lab Work: ?Dig level today over at [employer]. Stop at registration and they will guide you to location.  ? ?If you have labs (blood work) drawn today and your tests are completely normal, you will receive your results only by: ?MyChart Message (if you have MyChart) OR ?A paper copy in the mail ?If you have any lab test that is abnormal or we need to change your treatment, we will call you to review the results. ? ? ?Testing/Procedures: ?None ? ? ?Follow-Up: ?At [facility], you and your health needs are our priority.  As part of our continuing mission to provide you with exceptional heart care, we have created designated Provider Care Teams.  These Care Teams include your primary Cardiologist (physician) and Advanced Practice Providers (APPs -  Physician Assistants and Nurse Practitioners) who all work together to provide you with the care you need, when you need it. ? ? ?Your next appointment:   ?6 week(s) ? ?The format for your next appointment:   ?In Person ? ?Provider:   ?Nelva Bush, MD or Murray Hodgkins, NP ?
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
## 2021-05-21 ENCOUNTER — Other Ambulatory Visit
Admission: RE | Admit: 2021-05-21 | Discharge: 2021-05-21 | Disposition: A | Discharge status: Home / Self Care | Payer: 59 | Source: Ambulatory Visit | Attending: Internal Medicine | Admitting: Internal Medicine

## 2021-05-21 ENCOUNTER — Other Ambulatory Visit: Payer: Self-pay

## 2021-05-21 ENCOUNTER — Telehealth: Payer: Self-pay | Admitting: Internal Medicine

## 2021-05-21 DIAGNOSIS — I4819 Other persistent atrial fibrillation: Secondary | ICD-10-CM

## 2021-05-21 LAB — TSH: TSH: 0.044 u[IU]/mL — ABNORMAL LOW (ref 0.350–4.500)

## 2021-05-21 LAB — COMPREHENSIVE METABOLIC PANEL
ALT: 19 U/L (ref 0–44)
AST: 21 U/L (ref 15–41)
Albumin: 4.1 g/dL (ref 3.5–5.0)
Alkaline Phosphatase: 135 U/L — ABNORMAL HIGH (ref 38–126)
Anion gap: 13 (ref 5–15)
BUN: 14 mg/dL (ref 8–23)
CO2: 25 mmol/L (ref 22–32)
Calcium: 8.9 mg/dL (ref 8.9–10.3)
Chloride: 104 mmol/L (ref 98–111)
Creatinine, Ser: 0.77 mg/dL (ref 0.44–1.00)
GFR, Estimated: >60 mL/min (ref >60)
Glucose, Bld: 110 mg/dL — ABNORMAL HIGH (ref 70–99)
Potassium: 3.6 mmol/L (ref 3.5–5.1)
Sodium: 142 mmol/L (ref 135–145)
Total Bilirubin: 1.4 mg/dL — ABNORMAL HIGH (ref 0.3–1.2)
Total Protein: 7.2 g/dL (ref 6.5–8.1)

## 2021-05-21 LAB — CBC
HCT: 36.9 % (ref 36.0–46.0)
Hemoglobin: 12.3 g/dL (ref 12.0–15.0)
MCH: 29.2 pg (ref 26.0–34.0)
MCHC: 33.3 g/dL (ref 30.0–36.0)
MCV: 87.6 fL (ref 80.0–100.0)
Platelets: 128 10*3/uL — ABNORMAL LOW (ref 150–400)
RBC: 4.21 MIL/uL (ref 3.87–5.11)
RDW: 16.1 % — ABNORMAL HIGH (ref 11.5–15.5)
WBC: 3.5 10*3/uL — ABNORMAL LOW (ref 4.0–10.5)
nRBC: 0 % (ref 0.0–0.2)

## 2021-05-21 LAB — T4, FREE: Free T4: 1.8 ng/dL — ABNORMAL HIGH (ref 0.61–1.12)

## 2021-05-21 LAB — DIGOXIN LEVEL: Digoxin Level: 0.4 ng/mL — ABNORMAL LOW (ref 0.8–2.0)

## 2021-05-21 NOTE — Telephone Encounter (Signed)
Called patient back, advised of blood work, she will go there today.  ? ?Blood work orders placed.  ? ?Patent verbalized understanding to report to ED if symptoms continue or worsen.  ? ?
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
## 2021-05-21 NOTE — Telephone Encounter (Signed)
Given her history of atrial fibrillation and thyroid disease, propranolol LA 160 mg BID is a reasonable regimen.  In light of her symptoms, I recommend that she come in for STAT labs at [facility] today, including CBC, TSH, free T4, CMP, and digoxin level.  If her weakness/confusion worsen, she should go to the ED as they could be a sign of worsening of her liver disease/hepatic encephalopathy. ? ?Nelva Bush, MD ?[place] ?

## 2021-05-21 NOTE — Telephone Encounter (Signed)
Contacted patient to discuss further.  ?Left call back number.  ? ?

## 2021-05-21 NOTE — Telephone Encounter (Signed)
Transferred call to nurse
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
## 2021-05-25 ENCOUNTER — Ambulatory Visit: Admit: 2021-05-25 | Payer: 59

## 2021-05-25 SURGERY — ESOPHAGOGASTRODUODENOSCOPY (EGD) WITH PROPOFOL
Anesthesia: General

## 2021-05-31 ENCOUNTER — Other Ambulatory Visit: Payer: Self-pay | Admitting: Internal Medicine

## 2021-06-11 ENCOUNTER — Ambulatory Visit (INDEPENDENT_AMBULATORY_CARE_PROVIDER_SITE_OTHER): Payer: 59 | Admitting: Internal Medicine

## 2021-06-11 ENCOUNTER — Encounter: Payer: Self-pay | Admitting: Internal Medicine

## 2021-06-11 VITALS — BP 106/70 | HR 77 | Ht 66.0 in | Wt 172.0 lb

## 2021-06-11 DIAGNOSIS — I2510 Atherosclerotic heart disease of native coronary artery without angina pectoris: Secondary | ICD-10-CM | POA: Diagnosis not present

## 2021-06-11 DIAGNOSIS — E785 Hyperlipidemia, unspecified: Secondary | ICD-10-CM

## 2021-06-11 DIAGNOSIS — R188 Other ascites: Secondary | ICD-10-CM

## 2021-06-11 DIAGNOSIS — K7460 Unspecified cirrhosis of liver: Secondary | ICD-10-CM

## 2021-06-11 DIAGNOSIS — I4819 Other persistent atrial fibrillation: Secondary | ICD-10-CM

## 2021-06-11 DIAGNOSIS — I5022 Chronic systolic (congestive) heart failure: Secondary | ICD-10-CM | POA: Diagnosis not present

## 2021-06-11 DIAGNOSIS — E0590 Thyrotoxicosis, unspecified without thyrotoxic crisis or storm: Secondary | ICD-10-CM | POA: Insufficient documentation

## 2021-06-11 NOTE — Patient Instructions (Signed)
Medication Instructions:  ? ?Your physician recommends that you continue on your current medications as directed. Please refer to the Current Medication list given to you today. ? ?*If you need a refill on your cardiac medications before your next appointment, please call your pharmacy* ? ? ?Lab Work: ? ?None ordered ? ?Testing/Procedures: ? ?Your physician has requested that you have an echocardiogram in 6 WEEKS. Echocardiography is a painless test that uses sound waves to create images of your heart. It provides your doctor with information about the size and shape of your heart and how well your heart?s chambers and valves are working. This procedure takes approximately one hour. There are no restrictions for this procedure. ? ? ? ?Follow-Up: ?At [facility], you and your health needs are our priority.  As part of our continuing mission to provide you with exceptional heart care, we have created designated Provider Care Teams.  These Care Teams include your primary Cardiologist (physician) and Advanced Practice Providers (APPs -  Physician Assistants and Nurse Practitioners) who all work together to provide you with the care you need, when you need it. ? ?We recommend signing up for the patient portal called "MyChart".  Sign up information is provided on this After Visit Summary.  MyChart is used to connect with patients for Virtual Visits (Telemedicine).  Patients are able to view lab/test results, encounter notes, upcoming appointments, etc.  Non-urgent messages can be sent to your provider as well.   ?To learn more about what you can do with MyChart, go to [email or web address].   ? ?Your next appointment:   ? ?Follow up in 6 weeks shortly AFTER echo ? ?The format for your next appointment:   ?In Person ? ?Provider:   ?You may see Nelva Bush, MD or one of the following Advanced Practice Providers on your designated Care Team:   ?Murray Hodgkins, NP ?Christell Faith, PA-C ?Cadence Kathlen Mody,  PA-C{ ? ?Important Information About [place] ? ? ? ? ? ? ?
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
## 2021-07-23 ENCOUNTER — Ambulatory Visit (INDEPENDENT_AMBULATORY_CARE_PROVIDER_SITE_OTHER): Payer: 59

## 2021-07-23 DIAGNOSIS — I5022 Chronic systolic (congestive) heart failure: Secondary | ICD-10-CM

## 2021-07-23 LAB — ECHOCARDIOGRAM LIMITED
Area-P 1/2: 2.91 cm2
Calc EF: 50.3 %
S' Lateral: 4.6 cm
Single Plane A2C EF: 52.8 %
Single Plane A4C EF: 45.6 %

## 2021-07-27 ENCOUNTER — Other Ambulatory Visit: Payer: Self-pay | Admitting: Gerontology

## 2021-07-27 DIAGNOSIS — R599 Enlarged lymph nodes, unspecified: Secondary | ICD-10-CM

## 2021-07-27 DIAGNOSIS — N6320 Unspecified lump in the left breast, unspecified quadrant: Secondary | ICD-10-CM

## 2021-07-28 ENCOUNTER — Ambulatory Visit
Admission: RE | Admit: 2021-07-28 | Discharge: 2021-07-28 | Disposition: A | Discharge status: Home / Self Care | Payer: 59 | Source: Ambulatory Visit | Attending: Gerontology | Admitting: Gerontology

## 2021-07-28 DIAGNOSIS — N6320 Unspecified lump in the left breast, unspecified quadrant: Secondary | ICD-10-CM | POA: Diagnosis present

## 2021-07-28 DIAGNOSIS — R599 Enlarged lymph nodes, unspecified: Secondary | ICD-10-CM

## 2021-07-28 DIAGNOSIS — G8929 Other chronic pain: Secondary | ICD-10-CM | POA: Insufficient documentation

## 2021-07-31 DIAGNOSIS — Z853 Personal history of malignant neoplasm of breast: Secondary | ICD-10-CM | POA: Insufficient documentation

## 2021-08-04 ENCOUNTER — Other Ambulatory Visit: Payer: Self-pay | Admitting: Gerontology

## 2021-08-04 DIAGNOSIS — R928 Other abnormal and inconclusive findings on diagnostic imaging of breast: Secondary | ICD-10-CM

## 2021-08-04 DIAGNOSIS — N63 Unspecified lump in unspecified breast: Secondary | ICD-10-CM

## 2021-08-11 ENCOUNTER — Other Ambulatory Visit: Payer: 59

## 2021-08-13 ENCOUNTER — Ambulatory Visit: Payer: 59 | Admitting: Internal Medicine

## 2021-08-13 ENCOUNTER — Ambulatory Visit
Admission: RE | Admit: 2021-08-13 | Discharge: 2021-08-13 | Disposition: A | Discharge status: Home / Self Care | Payer: 59 | Source: Ambulatory Visit | Attending: Gerontology | Admitting: Gerontology

## 2021-08-13 DIAGNOSIS — Z170 Estrogen receptor positive status [ER+]: Secondary | ICD-10-CM

## 2021-08-13 DIAGNOSIS — C50812 Malignant neoplasm of overlapping sites of left female breast: Secondary | ICD-10-CM | POA: Diagnosis not present

## 2021-08-13 DIAGNOSIS — N63 Unspecified lump in unspecified breast: Secondary | ICD-10-CM

## 2021-08-13 DIAGNOSIS — R928 Other abnormal and inconclusive findings on diagnostic imaging of breast: Secondary | ICD-10-CM

## 2021-08-13 DIAGNOSIS — C50412 Malignant neoplasm of upper-outer quadrant of left female breast: Secondary | ICD-10-CM

## 2021-08-13 HISTORY — DX: Estrogen receptor positive status (ER+): Z17.0

## 2021-08-13 HISTORY — DX: Estrogen receptor positive status (ER+): C50.412

## 2021-08-13 HISTORY — PX: BREAST BIOPSY: SHX20

## 2021-08-14 LAB — SURGICAL PATHOLOGY

## 2021-08-19 ENCOUNTER — Encounter: Payer: Self-pay | Admitting: *Deleted

## 2021-08-19 ENCOUNTER — Other Ambulatory Visit: Payer: Self-pay | Admitting: *Deleted

## 2021-08-19 DIAGNOSIS — C50919 Malignant neoplasm of unspecified site of unspecified female breast: Secondary | ICD-10-CM

## 2021-08-19 NOTE — Progress Notes (Signed)
Received referral for newly diagnosed left breast cancer from [facility] Radiology.  Navigation initiated.  Med Onc and Surgical consults scheduled. Dr. Janese Banks 7/10 and Dr. Bary Castilla 7/11.

## 2021-08-21 ENCOUNTER — Encounter: Payer: Self-pay | Admitting: Internal Medicine

## 2021-08-21 ENCOUNTER — Ambulatory Visit (INDEPENDENT_AMBULATORY_CARE_PROVIDER_SITE_OTHER): Payer: 59 | Admitting: Internal Medicine

## 2021-08-21 VITALS — BP 100/70 | HR 87 | Ht 66.0 in | Wt 174.0 lb

## 2021-08-21 DIAGNOSIS — C50919 Malignant neoplasm of unspecified site of unspecified female breast: Secondary | ICD-10-CM | POA: Diagnosis not present

## 2021-08-21 DIAGNOSIS — I5022 Chronic systolic (congestive) heart failure: Secondary | ICD-10-CM | POA: Diagnosis not present

## 2021-08-21 DIAGNOSIS — I2510 Atherosclerotic heart disease of native coronary artery without angina pectoris: Secondary | ICD-10-CM

## 2021-08-21 DIAGNOSIS — I4819 Other persistent atrial fibrillation: Secondary | ICD-10-CM

## 2021-08-21 DIAGNOSIS — Z79899 Other long term (current) drug therapy: Secondary | ICD-10-CM
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
## 2021-08-21 NOTE — Patient Instructions (Signed)
Medication Instructions:   Your physician recommends that you continue on your current medications as directed. Please refer to the Current Medication list given to you today.  *If you need a refill on your cardiac medications before your next appointment, please call your pharmacy*   Lab Work:  We have ordered a Digoxin level and will request that it be drawn at the [facility] with your upcoming labs  Testing/Procedures:  None ordered   Follow-Up: At [facility], you and your health needs are our priority.  As part of our continuing mission to provide you with exceptional heart care, we have created designated Provider Care Teams.  These Care Teams include your primary Cardiologist (physician) and Advanced Practice Providers (APPs -  Physician Assistants and Nurse Practitioners) who all work together to provide you with the care you need, when you need it.  We recommend signing up for the patient portal called "MyChart".  Sign up information is provided on this After Visit Summary.  MyChart is used to connect with patients for Virtual Visits (Telemedicine).  Patients are able to view lab/test results, encounter notes, upcoming appointments, etc.  Non-urgent messages can be sent to your provider as well.   To learn more about what you can do with MyChart, go to [email or web address].    Your next appointment:   6 week(s)  The format for your next appointment:   In Person  Provider:   You may see Nelva Bush, MD or one of the following Advanced Practice Providers on your designated Care Team:   Murray Hodgkins, NP Christell Faith, PA-C Cadence Kathlen Mody, PA-C{   Important Information About Sugar

## 2021-08-23 ENCOUNTER — Encounter: Payer: Self-pay | Admitting: Internal Medicine

## 2021-08-23 DIAGNOSIS — C50919 Malignant neoplasm of unspecified site of unspecified female breast: Secondary | ICD-10-CM | POA: Insufficient documentation

## 2021-08-24 ENCOUNTER — Encounter: Payer: Self-pay | Admitting: Oncology

## 2021-08-24 ENCOUNTER — Encounter: Payer: Self-pay | Admitting: *Deleted

## 2021-08-24 ENCOUNTER — Inpatient Hospital Stay: Payer: 59 | Attending: Oncology | Admitting: Oncology

## 2021-08-24 ENCOUNTER — Telehealth: Payer: Self-pay | Admitting: *Deleted

## 2021-08-24 ENCOUNTER — Inpatient Hospital Stay: Payer: 59

## 2021-08-24 VITALS — BP 85/64 | HR 63 | Temp 97.4°F | Resp 16 | Ht 66.0 in | Wt 174.4 lb

## 2021-08-24 DIAGNOSIS — Z951 Presence of aortocoronary bypass graft: Secondary | ICD-10-CM | POA: Diagnosis not present

## 2021-08-24 DIAGNOSIS — Z170 Estrogen receptor positive status [ER+]: Secondary | ICD-10-CM

## 2021-08-24 DIAGNOSIS — Z8042 Family history of malignant neoplasm of prostate: Secondary | ICD-10-CM | POA: Insufficient documentation

## 2021-08-24 DIAGNOSIS — Z7901 Long term (current) use of anticoagulants: Secondary | ICD-10-CM | POA: Diagnosis not present

## 2021-08-24 DIAGNOSIS — C50812 Malignant neoplasm of overlapping sites of left female breast: Secondary | ICD-10-CM | POA: Insufficient documentation

## 2021-08-24 DIAGNOSIS — I4819 Other persistent atrial fibrillation: Secondary | ICD-10-CM

## 2021-08-24 DIAGNOSIS — C50412 Malignant neoplasm of upper-outer quadrant of left female breast: Secondary | ICD-10-CM | POA: Insufficient documentation

## 2021-08-24 DIAGNOSIS — Z79899 Other long term (current) drug therapy: Secondary | ICD-10-CM | POA: Diagnosis not present

## 2021-08-24 DIAGNOSIS — E039 Hypothyroidism, unspecified: Secondary | ICD-10-CM | POA: Insufficient documentation

## 2021-08-24 DIAGNOSIS — Z7189 Other specified counseling: Secondary | ICD-10-CM

## 2021-08-24 DIAGNOSIS — I25110 Atherosclerotic heart disease of native coronary artery with unstable angina pectoris: Secondary | ICD-10-CM | POA: Diagnosis not present

## 2021-08-24 DIAGNOSIS — Z794 Long term (current) use of insulin: Secondary | ICD-10-CM | POA: Insufficient documentation

## 2021-08-24 DIAGNOSIS — F17210 Nicotine dependence, cigarettes, uncomplicated: Secondary | ICD-10-CM | POA: Diagnosis not present

## 2021-08-24 NOTE — Progress Notes (Unsigned)
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 normal. Ultrasound over the right  axilla is normal. IMPRESSION: 1. Suspicious 1.1 cm mass over the 12 o'clock position of the left breast accounting for patient's palpable abnormality. No left axillary adenopathy. 2. No focal abnormality over the 9 o'clock nor 4 o'clock positions of the left breast to account for patient's other 2 palpable areas. 3. No focal abnormality over the right axilla to account for patient's palpable abnormality. RECOMMENDATION: 1. Recommend ultrasound-guided core needle biopsy of the suspicious mass at the 12 o'clock position of the left breast. 2. Recommend continued management of patient's additional left breast palpable abnormalities and right axillary palpable abnormality on a clinical basis. I have discussed the findings and recommendations with the patient. If applicable, a reminder letter will be sent to the patient regarding the next appointment. BI-RADS CATEGORY  5: Highly suggestive of malignancy. Biopsy will be scheduled by the administrative staff at the [facility]. Electronically  Signed   By: Marin Olp M.D.   On: 07/28/2021 15:36   Assessment and plan- Patient is a 61 y.o. female ***   Thank you for this kind referral and the opportunity to participate in the care of this patient   Visit Diagnosis 1. Malignant neoplasm of upper-outer quadrant of left breast in female, estrogen receptor positive ([place])   2. Goals of care, counseling/discussion     Dr. Randa Evens, MD, MPH [facility] [phone] 08/24/2021

## 2021-08-24 NOTE — Telephone Encounter (Signed)
Changed digoxin level lab order to be done at the medical mall.  Pt did not require labs at the cancer center today.  Spoke with pt and she will have done tomorrow morning.

## 2021-08-24 NOTE — Progress Notes (Signed)
Accompanied patient and husband to initial medical oncology appointment.   Reviewed Breast Cancer treatment handbook.   Care plan summary given to patient.

## 2021-08-25 ENCOUNTER — Telehealth: Payer: Self-pay | Admitting: *Deleted

## 2021-08-25 ENCOUNTER — Other Ambulatory Visit: Payer: Self-pay | Admitting: General Surgery

## 2021-08-25 ENCOUNTER — Other Ambulatory Visit
Admission: RE | Admit: 2021-08-25 | Discharge: 2021-08-25 | Disposition: A | Discharge status: Home / Self Care | Payer: 59 | Attending: Internal Medicine | Admitting: Internal Medicine

## 2021-08-25 DIAGNOSIS — I4819 Other persistent atrial fibrillation: Secondary | ICD-10-CM | POA: Insufficient documentation

## 2021-08-25 DIAGNOSIS — Z79899 Other long term (current) drug therapy: Secondary | ICD-10-CM

## 2021-08-25 DIAGNOSIS — Z170 Estrogen receptor positive status [ER+]: Secondary | ICD-10-CM

## 2021-08-25 LAB — DIGOXIN LEVEL: Digoxin Level: 1.1 ng/mL (ref 0.8–2.0)

## 2021-08-25 MED ORDER — DIGOXIN 125 MCG PO TABS: 0.0625 mg | ORAL_TABLET | Freq: Every day | ORAL | Status: DC

## 2021-08-25 NOTE — Telephone Encounter (Signed)
-----   Message from Nelva Bush, MD sent at 08/25/2021  4:10 PM EDT ----- Please let Ariana Riggs know that her digoxin level is borderline high.  I recommend that we decrease her digoxin dose to 0.0625 mg (1/2 tablet) daily and recheck a digoxin level in 1-2 weeks.

## 2021-08-25 NOTE — Telephone Encounter (Signed)
Spoke with pt. Notified of lab results and Dr. Darnelle Bos recc below.  Pt voiced understanding. Pt will:    - Decrease digoxin dose to 0.0625 mg (will take 1/2 tablet) daily    - Repeat digoxin level in 1-2 weeks  Lab orders placed.  Pt will have labs at the medical mall at [facility].   Pt has no further questions at this time.

## 2021-08-26 ENCOUNTER — Other Ambulatory Visit: Payer: Self-pay | Admitting: General Surgery

## 2021-08-26 DIAGNOSIS — K7030 Alcoholic cirrhosis of liver without ascites: Secondary | ICD-10-CM

## 2021-08-26 NOTE — Progress Notes (Signed)
Progress Notes - documented in this encounter Dayan Desa, Geronimo Boot, MD - 08/25/2021 8:15 AM EDT Formatting of this note is different from the original. Images from the original note were not included. Subjective:   Patient ID: Ariana Riggs is a 61 y.o. female.  HPI  The following portions of the patient's history were reviewed and updated as appropriate.  This a new patient is here today for: office visit. Here for evaluation of new diagnosis of left breast cancer referred by Thornton Dales NP. She first noticed the left breast mass about one week before her mammogram, prior mammogram was in 2020.  She had a left breast biopsy on 08-13-21. Her oncology appointment was with Dr Janese Banks 08-24-21.  She is here with her husband, Liliane Channel.  She states her bra size is a   Review of Systems  Constitutional: Negative for chills and fever.  Respiratory: Negative for cough.   Chief Complaint  Patient presents with  New Patient    BP 100/68  Pulse 83  Temp 36.3 C (97.3 F)  Ht 165.1 cm ($RemoveB'5\' 5"'ORAMrssq$ )  Wt 78.5 kg (173 lb)  SpO2 93%  BMI 28.79 kg/m   Past Medical History:  Diagnosis Date  Abnormal TSH 08/01/2018  TSH 9.347 04/20/2018, 6.670 06/20/2018; likely due to amiodarone  Aortic atherosclerosis (CMS-HCC) 10/14/2016  On 10/14/2016 CT of chest  Bipolar disorder (CMS-HCC)  Dr. 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 Kasandra Knudsen  Diabetes (CMS-HCC)  Gastroesophageal reflux  History of closed head injury  1975; in coma for 3 days  History of cocaine abuse  (CMS-HCC)  crack cocaine; none since 08/2008  Hyperlipidemia  Impaired glucose tolerance 09/07/2016  HgbA1c 5.7% on 09/07/2016  Lymphedema 04/10/2018  Obstructive sleep apnea 04/04/2015  On 04/04/2015 sleep study; CPAP 10 cm  Obstructive sleep apnea 04/04/2015  On 04/04/2015 sleep study; CPAP 10 cm  Persistent atrial fibrillation (CMS-HCC) 08/31/2018  Reflux esophagitis 07/09/2015  Tenosynovitis of right hand 02/19/2014  DeQuervain's    Past Surgical History:  Procedure Laterality Date  repair of skull fracture Right 1975  3 surgeries following head injury in [place] W/CP[place]  reanastomosis of fallopian tubes Bilateral 3536  complicated by abdominal hematoma  EGD 07/09/2015  Reflux esophagitis/No Repeat/Dr Oh  COLONOSCOPY 09/21/2018  Fair colon prep/FHx CP/Repeat 35yrs/MUS  EGD 09/21/2018  Gastritis/Esophagitis/Repeat 34yrs/MUS  coronary stent 06/18/2019  left heart cath Left 11/28/2020  angiography  BREAST EXCISIONAL BIOPSY Left 08/13/2021  COLONOSCOPY  LAPAROSCOPIC TUBAL LIGATION Bilateral  LAPAROSCOPIC TUBAL LIGATION Bilateral  repeat  UPPER GASTROINTESTINAL ENDOSCOPY    OB History   Gravida  2  Para  2  Term   Preterm   AB   Living     SAB   IAB   Ectopic   Molar   Multiple   Live Births     Obstetric Comments  Age at first period 51 Age of first pregnancy 59      Social History   Socioeconomic History  Marital status: Married  Tobacco Use  Smoking status: Some Days  Packs/day:  0.50  Years: 48.00  Pack years: 24.00  Types: Cigarettes  Smokeless tobacco: Never  Tobacco comments:  Last attempt to quit smoking 03-13-2018  Vaping Use  Vaping Use: Never used  Substance and Sexual Activity  Alcohol use: Not Currently  Alcohol/week: 0.0 standard drinks  Comment: Quit 08/2008  Drug use: Not Currently  Comment: history of crack cocaine use-- none since 08/2008  Sexual  activity: Not Currently  Social History Narrative  Divorced and remarried twice; divorced then remarried her ex; lives with husband    Allergies  Allergen Reactions  Aspirin Other (See Comments)  Relatively contraindicated with Eliquis  Doxycycline Nausea And Vomiting  Melatonin Hives  Paxil [Paroxetine Hcl] Rash and Hives  Penicillin Rash  Sulfa (Sulfonamide Antibiotics) Rash  Erythromycin Nausea And Vomiting   Current Outpatient Medications  Medication Sig Dispense Refill  albuterol 90 mcg/actuation inhaler Inhale 2 inhalations into the lungs every 4 (four) hours as needed for Wheezing 3 g 2  ANORO ELLIPTA 62.5-25 mcg/actuation inhaler USE 1 INHALATION BY MOUTH INTO THE LUNGS ONCE DAILY 60 each 0  atorvastatin (LIPITOR) 10 MG tablet TAKE 1 TABLET BY MOUTH ONCE DAILY FOR CHOLESTEROL 90 tablet 1  BELSOMRA 20 mg Tab Take 20 mg by mouth nightly  busPIRone (BUSPAR) 10 MG tablet  citalopram (CELEXA) 20 MG tablet Take 20 mg by mouth once daily  colchicine 0.6 mg Cap TAKE 1 CAPSULE BY MOUTH 2 (TWO) TIMES DAILY AS NEEDED (GOUT PAIN) 40 capsule 1  digoxin (LANOXIN) 0.125 MG tablet Take 0.125 mg by mouth once daily  ELIQUIS 5 mg tablet TAKE 1 TABLET BY MOUTH EVERY 12 HOURS AS BLOOD THINNER 180 tablet 3  fluticasone propionate (FLONASE) 50 mcg/actuation nasal spray Place 2 sprays into both nostrils once daily as needed for Allergies 48 g 2  FOLIC ACID/MULTIVIT-MIN/LUTEIN (CENTRUM SILVER ORAL) Take 1 tablet by mouth once daily.  FUROsemide (LASIX) 40 MG tablet Take 1 tablet (40 mg total) by mouth 2 (two) times daily every am and every afternoon, for fluid 60 tablet 11  lancets (ONETOUCH DELICA PLUS LANCET) once daily Check sugars 100 each 3  lansoprazole (PREVACID) 30 MG DR capsule Take 1 capsule (30 mg total) by mouth once daily for reflux 90 capsule 3  LANTUS SOLOSTAR U-100 INSULIN pen injector (concentration 100 units/mL) Inject 8 Units subcutaneously nightly Reduce Lantus dosing to 5 units  nightly (08/29/2020)  LORazepam (ATIVAN) 1 MG tablet TAKE 1 TABLET BY MOUTH EVERY MORNING AND EVERY EVENING AS DIRECTED  losartan (COZAAR) 25 MG tablet Take 1 tablet (25 mg total) by mouth once daily For heart (Patient taking differently: Take 25 mg by mouth once daily Pt reports that she is taking half tablet (12.5 mg) daily (08/29/2020)) 90 tablet 3  methIMAzole (TAPAZOLE) 10 MG tablet Take 2 tablets (20 mg total) by mouth 2 (two) times daily 120 tablet 6  metoprolol succinate (TOPROL-XL) 50 MG XL tablet  montelukast (SINGULAIR) 10 mg tablet TAKE 1 TABLET BY MOUTH AT NIGHT 90 tablet 3  nitroGLYcerin (NITROSTAT) 0.4 MG SL tablet Place 1 tablet (0.4 mg total) under the tongue every 5 (five) minutes as needed  pen needle, diabetic (BD NANO 2ND GEN PEN NEEDLE) 32 gauge x 5/32" Ndle Use once daily with Lantus Solostar pen 100 each 1  potassium chloride (KLOR-CON) 20 mEq packet Take 20 mEq by mouth 2 (two) times daily Often forgets this  propranoloL (INDERAL LA) 160 mg LA capsule Take 160 mg by mouth 2 (two) times daily  rifAXIMin (XIFAXAN) 550 mg tablet Take 1 tablet (550 mg total) by mouth 2 (two) times daily 60 tablet 11  spironolactone (ALDACTONE) 25 MG tablet Take 1 tablet (25 mg total) by mouth once daily For heart and fluid 90 tablet 3  traZODone (DESYREL) 50 MG tablet Take 50 mg by mouth nightly  allopurinoL (ZYLOPRIM) 300 MG tablet TAKE 1 TABLET BY MOUTH ONCE DAILY TO PREVENT GOUT FLARES (Patient not taking: Reported on 07/24/2021) 90 tablet 1  blood glucose diagnostic test strip 1 each (1 strip total) 2 (two) times daily Use as instructed. 200 each 3  lactulose (ENULOSE) 10 gram/15 mL oral solution Take 30 mLs by mouth once daily for 90 days 2700 mL 1   No current facility-administered medications for this visit.   Family History  Problem Relation Age of Onset  High blood pressure (Hypertension) Mother  Diabetes type II Mother  Hyperlipidemia (Elevated cholesterol) Mother  Peripheral  Vascular Disease (PVD or blocked arteries in arms and legs) Mother  Prostate cancer Father  Dementia Father  Peripheral Vascular Disease (PVD or blocked arteries in arms and legs) Sister  Myocardial Infarction (Heart attack) Sister  Other Son  ITP S/P splenectomy  Breast cancer Cousin 28  maternal   Labs and Radiology:  MELD score: Based on July 20, 2021 labs.  Dialysis at least twice in the past week Or CVVHD for =24 hours in the past week: No  Creatinine Cr >4.0 mg/dL is automatically assigned a value of 4.0 .9 mg/dL  Bilirubin 1.3 mg/dL  INR 1.6 Sodium 069 mEq/L  24 points MELD Score (2016)* 19.6%  August 25, 2021 in office ultrasound:  At the 12 o'clock position, 5 cm from the nipple there is a ill-defined hypoechoic mass measuring 1.3 x 1.5 x 1.56 cm. Minimal increased adjacent flow. In the axilla at the level of the manubrium a node with somewhat abnormal architecture measuring 0.7 x 1.16 x 1.6 cm is noted. This may be inflammatory postbiopsy related hematoma. BI-RADS-6.  Left breast biopsy August 13, 2021:  A. BREAST, LEFT AT 12:00, 5 CM FROM THE NIPPLE; ULTRASOUND-GUIDED CORE  NEEDLE BIOPSY:  - INVASIVE MAMMARY CARCINOMA, NO SPECIAL TYPE.   Size of invasive carcinoma: 4 mm in this sample  Histologic grade of invasive carcinoma: Grade 1  Glandular/tubular differentiation score: 2  Nuclear pleomorphism score: 2  Mitotic rate score: 1  Total score: 5  Estrogen Receptor (ER) Status: POSITIVE  Percentage of cells with nuclear positivity: Greater than 90%  Average intensity of staining: Strong   Progesterone Receptor (PgR) Status: POSITIVE  Percentage of cells with nuclear positivity: Greater than 90%  Average intensity of staining: Strong   HER2 (by immunohistochemistry): NEGATIVE (Score 1+)  Ki-67: Not performed   Laboratory review:  August 25, 2021 digoxin:  Digoxin Level 0.8 - 2.0 ng/mL 1.1 0.4 Low CM 0.9 CM   July 20, 2021 laboratory:  TSH - [facility]  0.450 - 4.500 uIU/mL 9.130 High  Free T4 - [facility] 0.82 - 1.77 ng/dL 0.73    Component Ref Range & Units 1 mo ago  T3, Free - [facility] 2.0 - 4.4 pg/mL 2.6     Component Ref Range & Units 1 mo ago  Ammonia - [facility] 34 - 178 ug/dL 40   Glucose 70 - 175 mg/dL 80  Sodium 128 - 505 mmol/L 141  Potassium 3.6 - 5.1 mmol/L 5.1  Chloride 97 - 109 mmol/L 102  Carbon Dioxide (CO2) 22.0 - 32.0 mmol/L 33.9 High  Calcium 8.7 - 10.3 mg/dL 9.8  Urea Nitrogen (BUN) 7 - 25 mg/dL 13  Creatinine 0.6 - 1.1 mg/dL 0.9  Glomerular Filtration Rate (eGFR), MDRD Estimate >60 mL/min/1.73sq m 64  BUN/Crea Ratio 6.0 - 20.0 14.4  Anion Gap w/K 6.0 - 16.0 10.2   Protein, Total 6.1 - 7.9 g/dL 7.2  Albumin 3.5 - 4.8 g/dL 4.8  Bilirubin, Total 0.3 - 1.2 mg/dL 1.3 High  Bilirubin, Conjugated 0.00 - 0.20 mg/dL 0.41 High  Alk Phos (alkaline Phosphatase) 34 - 104 U/L 198 High  AST 8 - 39 U/L 19  ALT 5 - 38 U/L 15    Component Ref Range & Units 1 mo ago  Prothrombin Time 10.6 - 13.0 Sec 17.4 High  Prothrombin INR 2.0 - 3.0 1.6 Low    WBC (White Blood Cell Count) 4.1 - 10.2 10^3/uL 5.1  RBC (Red Blood Cell Count) 4.04 - 5.48 10^6/uL 4.46  Hemoglobin 12.0 - 15.0 gm/dL 13.9  Hematocrit 35.0 - 47.0 % 42.2  MCV (Mean Corpuscular Volume) 80.0 - 100.0 fl 94.6  MCH (Mean Corpuscular Hemoglobin) 27.0 - 31.2 pg 31.2  MCHC (Mean Corpuscular Hemoglobin Concentration) 32.0 - 36.0 gm/dL 32.9  Platelet Count 150 - 450 10^3/uL 124 Low  RDW-CV (Red Cell Distribution Width) 11.6 - 14.8 % 15.9 High  MPV (Mean Platelet Volume) 9.4 - 12.4 fl 9.1 Low  Neutrophils 1.50 - 7.80 10^3/uL 3.36  Lymphocytes 1.00 - 3.60 10^3/uL 1.31  Monocytes 0.00 - 1.50 10^3/uL 0.40  Eosinophils 0.00 - 0.55 10^3/uL 0.04  Basophils 0.00 - 0.09 10^3/uL 0.01  Neutrophil % 32.0 - 70.0 % 65.5  Lymphocyte % 10.0 - 50.0 % 25.5  Monocyte % 4.0 - 13.0 % 7.8  Eosinophil % 1.0 - 5.0 % 0.8 Low  Basophil% 0.0 - 2.0 % 0.2  Immature Granulocyte % <=0.7 %  0.2  Immature Granulocyte Count <=0.06 10^3/L 0.01   Noted elevated INR 1.6, low platelet count at 124,000.  June 11, 2021 ECG:  Atrial fibrillation. No interval change from April 30, 2021. Incomplete right bundle branch block.   Objective:  Physical Exam Exam conducted with a chaperone present.  Constitutional:  Appearance: Normal appearance.  Cardiovascular:  Rate and Rhythm: Normal rate. Rhythm irregularly irregular.  Pulses: Normal pulses.  Heart sounds: Normal heart sounds.  Pulmonary:  Effort: Pulmonary effort is normal.  Breath sounds: Normal breath sounds.  Chest:   Comments: Extensive bruising in the 12 o'clock position at the site of her recent biopsy. Firm nodular area with indistinct borders 12:00. Musculoskeletal:  Cervical back: Neck supple.  Right lower leg: No edema.  Left lower leg: No edema.  Skin: General: Skin is warm and dry.  Neurological:  Mental Status: She is alert and oriented to person, place, and time.  Psychiatric:  Mood and Affect: Mood normal.  Behavior: Behavior normal.    Assessment:   Newly diagnosed left breast cancer.  Cirrhosis with elevated risk factors.  Chronic atrial fibrillation on anticoagulation therapy.  History of heart failure. COPD. History of sleep apnea, not presently using CPAP. (60 pound weight loss). History of gout Plan:   Medical oncology notes of August 24, 2021 reviewed.  Cardiology notes of August 21, 2021 reviewed: Echo showed a ejection fraction of 50 to 55%.  Breast cancer and preoperative cardiovascular risk assessment: Ms. Brede appears well compensated from a heart failure and atrial fibrillation standpoint. I think it would be appropriate for her to move forward with elective breast procedures, if necessary,  from a heart standpoint. Given her underlying liver disease, consultation with her hepatologist may also be beneficial before undergoing general anesthesia. If surgery is pursued, apixaban should  be held 2 days before the procedure and restarted as soon as is it is safe to do so from a postoperative standpoint. Given PCI in 2021, I would recommend using aspirin 81 mg daily while apixaban is on hold.   With her histologic grade 1, stage I ER/PR positive tumor she is a candidate for upfront surgery.  Increased risk of bleeding due to her low platelet count and this modest ongoing anticoagulation from hepatic insufficiency as well as Eliquis.  Breast conservation and mastectomy were presented is therapeutically equivalent. Role of radiation for post lumpectomy treatment reviewed. Potential candidate for antiestrogen therapy based on renal function.    This note is partially prepared by Karie Fetch, RN, acting as a scribe in the presence of Dr. Hervey Ard, MD.  The documentation recorded by the scribe accurately reflects the service I personally performed and the decisions made by me.   Robert Bellow, MD FACS  Electronically signed by Mayer Masker, MD at 08/26/2021 7:33 AM EDT

## 2021-08-27 ENCOUNTER — Other Ambulatory Visit: Payer: Self-pay

## 2021-08-27 ENCOUNTER — Encounter: Payer: Self-pay | Admitting: General Surgery

## 2021-08-27 ENCOUNTER — Encounter
Admission: RE | Admit: 2021-08-27 | Discharge: 2021-08-27 | Disposition: A | Discharge status: Home / Self Care | Payer: 59 | Source: Ambulatory Visit | Attending: General Surgery | Admitting: General Surgery

## 2021-08-27 VITALS — Ht 66.0 in | Wt 174.0 lb

## 2021-08-27 DIAGNOSIS — E119 Type 2 diabetes mellitus without complications: Secondary | ICD-10-CM

## 2021-08-27 NOTE — Patient Instructions (Signed)
Your procedure is scheduled on: 08/31/21 Report to [place] .AT 8:15 AM AS INSTRUCTED  Remember: Instructions that are not followed completely may result in serious medical risk, up to and including death, or upon the discretion of your surgeon and anesthesiologist your surgery may need to be rescheduled.     _X__ 1. Do not eat food after midnight the night before your procedure.                 No gum chewing or hard candies. You may drink clear liquids up to 2 hours                 before you are scheduled to arrive for your surgery-                  Diabetics water only  __X__2.  On the morning of surgery brush your teeth with toothpaste and water, you                 may rinse your mouth with mouthwash if you wish.  Do not swallow any              toothpaste of mouthwash.     _X__ 3.  No Alcohol for 24 hours before or after surgery.   _X__ 4.  Do Not Smoke or use e-cigarettes For 24 Hours Prior to Your Surgery.                 Do not use any chewable tobacco products for at least 6 hours prior to                 surgery.  ____  5.  Bring all medications with you on the day of surgery if instructed.   __X__  6.  Notify your doctor if there is any change in your medical condition      (cold, fever, infections).     Do not wear jewelry, make-up, hairpins, clips or nail polish. Do not wear lotions, powders, or perfumes. NO DEODORANT Do not shave BODY HAIR 48 hours prior to surgery. Men may shave face and neck. Do not bring valuables to the hospital.    [facility] is not responsible for any belongings or valuables.  Contacts, dentures/partials or body piercings may not be worn into surgery. Bring a case for your contacts, glasses or hearing aids, a denture cup will be supplied. Leave your suitcase in the car. After surgery it may be brought to your room. For patients admitted to the hospital, discharge time is determined by your treatment  team.   Patients discharged the day of surgery will not be allowed to drive home.   Please read over the following fact sheets that you were given:     __X__ Take these medicines the morning of surgery with A SIP OF WATER:    1. atorvastatin (LIPITOR) 10 MG tablet  2. busPIRone (BUSPAR) 10 MG tablet  3. citalopram (CELEXA) 20 MG tablet  4. digoxin (LANOXIN) 1/2 TABLET  5. lansoprazole (PREVACID) 30 MG capsule  6. LORazepam (ATIVAN) 1 MG tablet  7. methimazole (TAPAZOLE) 10 MG tablet  8. propranolol ER (INDERAL LA) 160 MG SR capsule  9. XIFAXAN 550 MG TABS tablet  ____ Fleet Enema (as directed)   ____ Use CHG Soap/SAGE wipes as directed   __X__ Use inhalers on the day of surgery  ____ Stop metformin/Janumet/Farxiga 2 days prior to surgery  ____ Take 1/2 of usual insulin dose the night before surgery. No insulin the morning          of surgery.   ____ Stop Blood Thinners Coumadin/Plavix/Xarelto/Pleta/Pradaxa/Eliquis/Effient/Aspirin  on   Or contact your Surgeon, Cardiologist or Medical Doctor regarding  ability to stop your blood thinners  __X__ Stop Anti-inflammatories 7 days before surgery such as Advil, Ibuprofen, Motrin,  BC or Goodies Powder, Naprosyn, Naproxen, Aleve   __X__ Stop all herbals and  supplements, fish oil or vitamins  until after surgery.    ____ Bring C-Pap to the hospital.   SHOWER DAILY INCLUDING DAY OF SURGERY WITH GOLD BAR DIAL SOAP  TAKE LACTULOSE AS INSTRUCTED  ASPIRIN 105 MG TO BE TAKEN IN PLACE OF ELIQUIS WHICH IS TO BE HELD SATURDAY AND SUNDAY
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
## 2021-08-28 ENCOUNTER — Encounter: Payer: Self-pay | Admitting: General Surgery

## 2021-08-31 ENCOUNTER — Ambulatory Visit: Payer: 59

## 2021-08-31 ENCOUNTER — Ambulatory Visit
Admission: RE | Admit: 2021-08-31 | Discharge: 2021-08-31 | Disposition: A | Discharge status: Home / Self Care | Payer: 59 | Source: Ambulatory Visit | Attending: General Surgery | Admitting: General Surgery

## 2021-08-31 ENCOUNTER — Other Ambulatory Visit: Payer: Self-pay

## 2021-08-31 ENCOUNTER — Ambulatory Visit: Payer: 59 | Admitting: Urgent Care

## 2021-08-31 ENCOUNTER — Encounter
Admission: RE | Disposition: A | Discharge status: Home / Self Care | Payer: Self-pay | Source: Home / Self Care | Attending: General Surgery

## 2021-08-31 ENCOUNTER — Ambulatory Visit
Admission: RE | Admit: 2021-08-31 | Discharge: 2021-08-31 | Disposition: A | Discharge status: Home / Self Care | Payer: 59 | Attending: General Surgery | Admitting: General Surgery

## 2021-08-31 ENCOUNTER — Encounter: Payer: Self-pay | Admitting: General Surgery

## 2021-08-31 DIAGNOSIS — C50912 Malignant neoplasm of unspecified site of left female breast: Secondary | ICD-10-CM | POA: Diagnosis present

## 2021-08-31 DIAGNOSIS — Z7901 Long term (current) use of anticoagulants: Secondary | ICD-10-CM | POA: Insufficient documentation

## 2021-08-31 DIAGNOSIS — E119 Type 2 diabetes mellitus without complications: Secondary | ICD-10-CM

## 2021-08-31 DIAGNOSIS — Z79899 Other long term (current) drug therapy: Secondary | ICD-10-CM | POA: Insufficient documentation

## 2021-08-31 DIAGNOSIS — F419 Anxiety disorder, unspecified: Secondary | ICD-10-CM | POA: Insufficient documentation

## 2021-08-31 DIAGNOSIS — I509 Heart failure, unspecified: Secondary | ICD-10-CM | POA: Insufficient documentation

## 2021-08-31 DIAGNOSIS — J449 Chronic obstructive pulmonary disease, unspecified: Secondary | ICD-10-CM | POA: Insufficient documentation

## 2021-08-31 DIAGNOSIS — C50412 Malignant neoplasm of upper-outer quadrant of left female breast: Secondary | ICD-10-CM

## 2021-08-31 DIAGNOSIS — F319 Bipolar disorder, unspecified: Secondary | ICD-10-CM | POA: Insufficient documentation

## 2021-08-31 DIAGNOSIS — I2510 Atherosclerotic heart disease of native coronary artery without angina pectoris: Secondary | ICD-10-CM | POA: Insufficient documentation

## 2021-08-31 DIAGNOSIS — I4819 Other persistent atrial fibrillation: Secondary | ICD-10-CM | POA: Insufficient documentation

## 2021-08-31 DIAGNOSIS — F17210 Nicotine dependence, cigarettes, uncomplicated: Secondary | ICD-10-CM | POA: Diagnosis not present

## 2021-08-31 DIAGNOSIS — Z955 Presence of coronary angioplasty implant and graft: Secondary | ICD-10-CM | POA: Diagnosis not present

## 2021-08-31 DIAGNOSIS — K219 Gastro-esophageal reflux disease without esophagitis: Secondary | ICD-10-CM | POA: Insufficient documentation

## 2021-08-31 DIAGNOSIS — I110 Hypertensive heart disease with heart failure: Secondary | ICD-10-CM | POA: Insufficient documentation

## 2021-08-31 DIAGNOSIS — K7460 Unspecified cirrhosis of liver: Secondary | ICD-10-CM | POA: Diagnosis not present

## 2021-08-31 DIAGNOSIS — I081 Rheumatic disorders of both mitral and tricuspid valves: Secondary | ICD-10-CM | POA: Insufficient documentation

## 2021-08-31 HISTORY — DX: Gastro-esophageal reflux disease with esophagitis, without bleeding: K21.00

## 2021-08-31 HISTORY — DX: Endocarditis, valve unspecified: I38

## 2021-08-31 HISTORY — DX: Unspecified cirrhosis of liver: K74.60

## 2021-08-31 HISTORY — DX: Chronic kidney disease, stage 3 unspecified: N18.30

## 2021-08-31 HISTORY — DX: Cocaine abuse, in remission: F14.11

## 2021-08-31 HISTORY — DX: Complete loss of teeth, unspecified cause, unspecified class: K08.109

## 2021-08-31 HISTORY — DX: Unspecified right bundle-branch block: I45.10

## 2021-08-31 HISTORY — DX: Sleep apnea, unspecified: G47.30

## 2021-08-31 HISTORY — DX: Insomnia, unspecified: G47.00

## 2021-08-31 HISTORY — DX: Other ascites: R18.8

## 2021-08-31 HISTORY — DX: Lymphedema, not elsewhere classified: I89.0

## 2021-08-31 HISTORY — DX: Gout, unspecified: M10.9

## 2021-08-31 HISTORY — DX: Radial styloid tenosynovitis (de quervain): M65.4

## 2021-08-31 HISTORY — DX: Long term (current) use of anticoagulants: Z79.01

## 2021-08-31 HISTORY — PX: BREAST LUMPECTOMY WITH SENTINEL LYMPH NODE BIOPSY: SHX5597

## 2021-08-31 HISTORY — DX: Other specified abnormal findings of blood chemistry: R79.89

## 2021-08-31 HISTORY — DX: Atherosclerosis of aorta: I70.0

## 2021-08-31 LAB — GLUCOSE, CAPILLARY: Glucose-Capillary: 141 mg/dL — ABNORMAL HIGH (ref 70–99)

## 2021-08-31 SURGERY — BREAST LUMPECTOMY WITH SENTINEL LYMPH NODE BX
Anesthesia: General | Laterality: Left

## 2021-08-31 MED ORDER — OXYCODONE HCL 5 MG PO TABS: 5.0000 mg | ORAL_TABLET | Freq: Once | ORAL | Status: DC | PRN

## 2021-08-31 MED ORDER — ONDANSETRON HCL 4 MG/2ML IJ SOLN
INTRAMUSCULAR | Status: DC | PRN
  Administered 2021-08-31: 4 mg via INTRAVENOUS

## 2021-08-31 MED ORDER — LIDOCAINE HCL (CARDIAC) PF 100 MG/5ML IV SOSY
PREFILLED_SYRINGE | INTRAVENOUS | Status: DC | PRN
  Administered 2021-08-31: 50 mg via INTRAVENOUS

## 2021-08-31 MED ORDER — OXYCODONE HCL 5 MG PO TABS: 5.0000 mg | ORAL_TABLET | ORAL | 0 refills | Status: DC | PRN

## 2021-08-31 MED ORDER — LACTATED RINGERS IV SOLN
INTRAVENOUS | Status: DC
Start: 2021-08-31 — End: 2021-08-31

## 2021-08-31 MED ORDER — FENTANYL CITRATE (PF) 100 MCG/2ML IJ SOLN: 25.0000 ug | INTRAMUSCULAR | Status: DC | PRN

## 2021-08-31 MED ORDER — CEFAZOLIN SODIUM-DEXTROSE 2-4 GM/100ML-% IV SOLN
2.0000 g | INTRAVENOUS | Status: AC
  Administered 2021-08-31: 2 g via INTRAVENOUS

## 2021-08-31 MED ORDER — ACETAMINOPHEN 10 MG/ML IV SOLN
INTRAVENOUS | Status: DC | PRN
  Administered 2021-08-31: 1000 mg via INTRAVENOUS

## 2021-08-31 MED ORDER — ORAL CARE MOUTH RINSE: 15.0000 mL | Freq: Once | OROMUCOSAL | Status: AC

## 2021-08-31 MED ORDER — EPHEDRINE SULFATE (PRESSORS) 50 MG/ML IJ SOLN
INTRAMUSCULAR | Status: DC | PRN
  Administered 2021-08-31: 10 mg via INTRAVENOUS
  Administered 2021-08-31 (×3): 5 mg via INTRAVENOUS

## 2021-08-31 MED ORDER — OXYCODONE HCL 5 MG/5ML PO SOLN: 5.0000 mg | Freq: Once | ORAL | Status: DC | PRN

## 2021-08-31 MED ORDER — SODIUM CHLORIDE 0.9 % IV SOLN: INTRAVENOUS | Status: DC

## 2021-08-31 MED ORDER — BUPIVACAINE-EPINEPHRINE (PF) 0.5% -1:200000 IJ SOLN
INTRAMUSCULAR | Status: DC | PRN
  Administered 2021-08-31: 30 mL

## 2021-08-31 MED ORDER — PHENYLEPHRINE HCL (PRESSORS) 10 MG/ML IV SOLN
INTRAVENOUS | Status: DC | PRN
  Administered 2021-08-31 (×14): 80 ug via INTRAVENOUS
  Administered 2021-08-31: 160 ug via INTRAVENOUS

## 2021-08-31 MED ORDER — DEXMEDETOMIDINE HCL IN NACL 200 MCG/50ML IV SOLN
INTRAVENOUS | Status: DC | PRN
  Administered 2021-08-31 (×2): 4 ug via INTRAVENOUS

## 2021-08-31 MED ORDER — EPHEDRINE 5 MG/ML INJ
INTRAVENOUS | Status: AC
  Filled 2021-08-31: qty 5

## 2021-08-31 MED ORDER — METHYLENE BLUE 1 % INJ SOLN
INTRAVENOUS | Status: AC
Start: 2021-08-31 — End: ?
  Filled 2021-08-31: qty 10

## 2021-08-31 MED ORDER — CHLORHEXIDINE GLUCONATE 0.12 % MT SOLN
OROMUCOSAL | Status: AC
  Administered 2021-08-31: 15 mL via OROMUCOSAL
  Filled 2021-08-31: qty 15

## 2021-08-31 MED ORDER — METHYLENE BLUE 1 % INJ SOLN
INTRAVENOUS | Status: DC | PRN
  Administered 2021-08-31: 3 mL via INTRADERMAL

## 2021-08-31 MED ORDER — PHENYLEPHRINE 80 MCG/ML (10ML) SYRINGE FOR IV PUSH (FOR BLOOD PRESSURE SUPPORT)
PREFILLED_SYRINGE | INTRAVENOUS | Status: AC
  Filled 2021-08-31: qty 10

## 2021-08-31 MED ORDER — GLYCOPYRROLATE 0.2 MG/ML IJ SOLN
INTRAMUSCULAR | Status: AC
  Filled 2021-08-31: qty 1

## 2021-08-31 MED ORDER — PROPOFOL 10 MG/ML IV BOLUS
INTRAVENOUS | Status: DC | PRN
  Administered 2021-08-31: 150 mg via INTRAVENOUS

## 2021-08-31 MED ORDER — ACETAMINOPHEN 10 MG/ML IV SOLN: 1000.0000 mg | Freq: Once | INTRAVENOUS | Status: DC | PRN

## 2021-08-31 MED ORDER — DEXAMETHASONE SODIUM PHOSPHATE 10 MG/ML IJ SOLN
INTRAMUSCULAR | Status: DC | PRN
  Administered 2021-08-31: 8 mg via INTRAVENOUS

## 2021-08-31 MED ORDER — CHLORHEXIDINE GLUCONATE CLOTH 2 % EX PADS: 6.0000 | MEDICATED_PAD | Freq: Once | CUTANEOUS | Status: DC

## 2021-08-31 MED ORDER — TECHNETIUM TC 99M TILMANOCEPT KIT
1.0000 | PACK | Freq: Once | INTRAVENOUS | Status: AC | PRN
  Administered 2021-08-31: 0.984 via INTRADERMAL

## 2021-08-31 MED ORDER — FENTANYL CITRATE (PF) 100 MCG/2ML IJ SOLN
INTRAMUSCULAR | Status: AC
  Filled 2021-08-31: qty 2

## 2021-08-31 MED ORDER — PROPOFOL 10 MG/ML IV BOLUS
INTRAVENOUS | Status: AC
  Filled 2021-08-31: qty 20

## 2021-08-31 MED ORDER — CHLORHEXIDINE GLUCONATE 0.12 % MT SOLN: 15.0000 mL | Freq: Once | OROMUCOSAL | Status: AC

## 2021-08-31 MED ORDER — APIXABAN 5 MG PO TABS: 5.0000 mg | ORAL_TABLET | Freq: Two times a day (BID) | ORAL | 1 refills | Status: DC

## 2021-08-31 MED ORDER — MIDAZOLAM HCL 2 MG/2ML IJ SOLN
INTRAMUSCULAR | Status: AC
  Filled 2021-08-31: qty 2

## 2021-08-31 MED ORDER — ACETAMINOPHEN 10 MG/ML IV SOLN
INTRAVENOUS | Status: AC
  Filled 2021-08-31: qty 100

## 2021-08-31 MED ORDER — MIDAZOLAM HCL 2 MG/2ML IJ SOLN
INTRAMUSCULAR | Status: DC | PRN
  Administered 2021-08-31: 2 mg via INTRAVENOUS

## 2021-08-31 MED ORDER — BUPIVACAINE-EPINEPHRINE (PF) 0.5% -1:200000 IJ SOLN
INTRAMUSCULAR | Status: AC
  Filled 2021-08-31: qty 30

## 2021-08-31 MED ORDER — GLYCOPYRROLATE 0.2 MG/ML IJ SOLN
INTRAMUSCULAR | Status: DC | PRN
  Administered 2021-08-31: .2 mg via INTRAVENOUS

## 2021-08-31 MED ORDER — CEFAZOLIN SODIUM-DEXTROSE 2-4 GM/100ML-% IV SOLN
INTRAVENOUS | Status: AC
  Filled 2021-08-31: qty 100

## 2021-08-31 MED ORDER — ONDANSETRON HCL 4 MG/2ML IJ SOLN
INTRAMUSCULAR | Status: AC
  Filled 2021-08-31: qty 2

## 2021-08-31 MED ORDER — FENTANYL CITRATE (PF) 100 MCG/2ML IJ SOLN
INTRAMUSCULAR | Status: DC | PRN
  Administered 2021-08-31 (×2): 25 ug via INTRAVENOUS
  Administered 2021-08-31: 50 ug via INTRAVENOUS

## 2021-08-31 MED ORDER — ONDANSETRON HCL 4 MG/2ML IJ SOLN: 4.0000 mg | Freq: Once | INTRAMUSCULAR | Status: DC | PRN

## 2021-08-31 MED ORDER — DEXAMETHASONE SODIUM PHOSPHATE 10 MG/ML IJ SOLN
INTRAMUSCULAR | Status: AC
  Filled 2021-08-31: qty 1

## 2021-08-31 SURGICAL SUPPLY — 62 items
APL PRP STRL LF DISP 70% ISPRP (MISCELLANEOUS) ×1
BINDER BREAST LRG (GAUZE/BANDAGES/DRESSINGS) ×1 IMPLANT
BLADE BOVIE TIP EXT 4 (BLADE) IMPLANT
BLADE PHOTON ILLUMINATED (MISCELLANEOUS) ×1 IMPLANT
BLADE SURG 15 STRL SS SAFETY (BLADE) ×4 IMPLANT
BULB RESERV EVAC DRAIN JP 100C (MISCELLANEOUS) IMPLANT
CHLORAPREP W/TINT 26 (MISCELLANEOUS) ×2 IMPLANT
CNTNR SPEC 2.5X3XGRAD LEK (MISCELLANEOUS)
CONT SPEC 4OZ STER OR WHT (MISCELLANEOUS)
CONT SPEC 4OZ STRL OR WHT (MISCELLANEOUS)
CONTAINER SPEC 2.5X3XGRAD LEK (MISCELLANEOUS) IMPLANT
COVER PROBE FLX POLY STRL (MISCELLANEOUS) ×2 IMPLANT
DEVICE DUBIN SPECIMEN MAMMOGRA (MISCELLANEOUS) ×2 IMPLANT
DRAIN CHANNEL JP 15F RND 16 (MISCELLANEOUS) IMPLANT
DRAPE LAPAROTOMY TRNSV 106X77 (MISCELLANEOUS) ×2 IMPLANT
DRSG GAUZE FLUFF 36X18 (GAUZE/BANDAGES/DRESSINGS) ×4 IMPLANT
DRSG TELFA 3X8 NADH (GAUZE/BANDAGES/DRESSINGS) ×2 IMPLANT
ELECT CAUTERY BLADE TIP 2.5 (TIP) ×2
ELECT REM PT RETURN 9FT ADLT (ELECTROSURGICAL) ×2
ELECTRODE CAUTERY BLDE TIP 2.5 (TIP) ×1 IMPLANT
ELECTRODE REM PT RTRN 9FT ADLT (ELECTROSURGICAL) ×1 IMPLANT
GAUZE 4X4 16PLY ~~LOC~~+RFID DBL (SPONGE) ×2 IMPLANT
GLOVE BIO SURGEON STRL SZ7.5 (GLOVE) ×2 IMPLANT
GLOVE SURG UNDER LTX SZ8 (GLOVE) ×4 IMPLANT
GOWN STRL REUS W/ TWL LRG LVL3 (GOWN DISPOSABLE) ×2 IMPLANT
GOWN STRL REUS W/TWL LRG LVL3 (GOWN DISPOSABLE) ×4
KIT TURNOVER KIT A (KITS) ×2 IMPLANT
LABEL OR SOLS (LABEL) ×2 IMPLANT
MANIFOLD NEPTUNE II (INSTRUMENTS) ×2 IMPLANT
MARGIN MAP 10MM (MISCELLANEOUS) ×2 IMPLANT
NDL HYPO 25X1 1.5 SAFETY (NEEDLE) ×2 IMPLANT
NDL SPNL 20GX3.5 QUINCKE YW (NEEDLE) IMPLANT
NEEDLE HYPO 22GX1.5 SAFETY (NEEDLE) ×2 IMPLANT
NEEDLE HYPO 25X1 1.5 SAFETY (NEEDLE) ×4 IMPLANT
NEEDLE SPNL 20GX3.5 QUINCKE YW (NEEDLE) IMPLANT
PACK BASIN MINOR ARMC (MISCELLANEOUS) ×2 IMPLANT
PAD DRESSING TELFA 3X8 NADH (GAUZE/BANDAGES/DRESSINGS) ×1 IMPLANT
PENCIL ELECTRO HAND CTR (MISCELLANEOUS) ×2 IMPLANT
RETRACTOR RING XSMALL (MISCELLANEOUS) IMPLANT
RTRCTR WOUND ALEXIS 13CM XS SH (MISCELLANEOUS)
SHEARS FOC LG CVD HARMONIC 17C (MISCELLANEOUS) IMPLANT
SHEARS HARMONIC 9CM CVD (BLADE) IMPLANT
SLEVE PROBE SENORX GAMMA FIND (MISCELLANEOUS) IMPLANT
STRIP CLOSURE SKIN 1/2X4 (GAUZE/BANDAGES/DRESSINGS) ×2 IMPLANT
SUT ETHILON 3-0 FS-10 30 BLK (SUTURE) ×2
SUT SILK 2 0 (SUTURE) ×2
SUT SILK 2-0 18XBRD TIE 12 (SUTURE) ×1 IMPLANT
SUT VIC AB 2-0 CT1 27 (SUTURE) ×4
SUT VIC AB 2-0 CT1 TAPERPNT 27 (SUTURE) ×2 IMPLANT
SUT VIC AB 3-0 54X BRD REEL (SUTURE) ×1 IMPLANT
SUT VIC AB 3-0 BRD 54 (SUTURE) ×2
SUT VIC AB 3-0 SH 27 (SUTURE) ×4
SUT VIC AB 3-0 SH 27X BRD (SUTURE) ×2 IMPLANT
SUT VIC AB 4-0 FS2 27 (SUTURE) ×4 IMPLANT
SUTURE EHLN 3-0 FS-10 30 BLK (SUTURE) ×1 IMPLANT
SWABSTK COMLB BENZOIN TINCTURE (MISCELLANEOUS) ×2 IMPLANT
SYR 10ML LL (SYRINGE) ×2 IMPLANT
SYR BULB IRRIG 60ML STRL (SYRINGE) ×2 IMPLANT
TAPE TRANSPORE STRL 2 31045 (GAUZE/BANDAGES/DRESSINGS) ×2 IMPLANT
TRAP NEPTUNE SPECIMEN COLLECT (MISCELLANEOUS) ×2 IMPLANT
WATER STERILE IRR 1000ML POUR (IV SOLUTION) ×2 IMPLANT
WATER STERILE IRR 500ML POUR (IV SOLUTION) ×2 IMPLANT

## 2021-08-31 NOTE — Anesthesia Postprocedure Evaluation (Signed)
Anesthesia Post Note  Patient: Ariana Riggs  Procedure(s) Performed: BREAST LUMPECTOMY WITH SENTINEL LYMPH NODE BX (Left)  Patient location during evaluation: PACU Anesthesia Type: General Level of consciousness: awake and alert Pain management: pain level controlled Vital Signs Assessment: post-procedure vital signs reviewed and stable Respiratory status: spontaneous breathing, nonlabored ventilation, respiratory function stable and patient connected to nasal cannula oxygen Cardiovascular status: blood pressure returned to baseline and stable Postop Assessment: no apparent nausea or vomiting Anesthetic complications: no   No notable events documented.   Last Vitals:  Vitals:   08/31/21 1217 08/31/21 1225  BP: 98/67 96/61  Pulse:  89  Resp:  16  Temp:  (!) 36.4 C  SpO2:  94%    Last Pain:  Vitals:   08/31/21 1225  TempSrc: Oral  PainSc: 0-No pain                 Martha Clan

## 2021-08-31 NOTE — Discharge Instructions (Signed)

## 2021-08-31 NOTE — Transfer of Care (Signed)
Immediate Anesthesia Transfer of Care Note  Patient: Eulamae Greenstein  Procedure(s) Performed: BREAST LUMPECTOMY WITH SENTINEL LYMPH NODE BX (Left)  Patient Location: PACU  Anesthesia Type:General  Level of Consciousness: drowsy and patient cooperative  Airway & Oxygen Therapy: Patient Spontanous Breathing and Patient connected to face mask oxygen  Post-op Assessment: Report given to RN and Post -op Vital signs reviewed and stable  Post vital signs: Reviewed and stable  Last Vitals:  Vitals Value Taken Time  BP 94/56 08/31/21 1148  Temp 36.3 C 08/31/21 1145  Pulse 68 08/31/21 1149  Resp 11 08/31/21 1149  SpO2 93 % 08/31/21 1149  Vitals shown include unvalidated device data.  Last Pain:  Vitals:   08/31/21 0929  TempSrc: Temporal         Complications: No notable events documented.

## 2021-08-31 NOTE — Op Note (Addendum)
Preoperative diagnosis: Invasive mammary carcinoma of the left breast.  Postoperative diagnosis: Same.  Operative procedure: Left breast wide excision with ultrasound guidance, tissue transfer 15 cm area, sentinel lymph node biopsy.  Operating surgeon: Hervey Ard, MD.  Assistant: Elsie Stain, RNFA  Anesthesia: General by LMA, Marcaine 0.5% with 1: 200,000 units of epinephrine, 30 cc local and pectoral block.  Estimated blood loss: 20 cc.  Clinical note: This 61 year old woman with cirrhosis, elevated PT/INR of 1.7, depressed serum platelets of 140,000 and a MELD score of 18 recently identified with invasive mammary carcinoma of the left breast.  In consultation with both GI and anesthesia she was felt to be a candidate for surgical resection.  She desires breast conservation.  She underwent injection with technetium sulfur colloid the morning of the procedure.  She did experience significant discomfort with the injection in spite of the use of Emla cream.  She received Ancef on induction of anesthesia.  SCD stockings for DVT prevention.  Eliquis had been held for 2 days.  History of atrial fibrillation, sinus rhythm on exam.  The patient underwent general anesthesia and tolerated this well.  The periareolar skin was cleansed with alcohol and 3 cc of 0.5% methylene blue was injected in the subareolar plexus.  The breast chest and axilla was then cleansed with ChloraPrep and draped.  Ultrasound was used to outline the boundaries of the irregular mass in the 12 o'clock position of the left breast.  This came to within 6 mm of the overlying skin and it was elected to make use of an elliptical incision to minimize the chance for a positive anterior margin.  Local anesthesia was infiltrated at the site of planned resection as well as a pectoral block in the axilla.  The skin was incised sharply and the remaining dissection completed with electrocautery.  The block of tissue eventually measured  approximately 4 x 4 x 4 cm including the underlying pectoralis fascia.  Specimen radiograph confirmed previous clip placement removal.  While the breast specimen was being processed attention was turned to the axilla.  The node seeker device was utilized and local anesthesia infiltrated at the inferior margin of the axillary envelope.  A transverse incision was made.  The skin was incised sharply and then deep tissue divided with cautery.  2 blue lymphatics were identified and followed down to a lymph node with a count of 7000 and an immediately adjacent node with counts of 5000.  High in the axilla and additional lymph node was identified with counts of 22,000.  1 nonsentinel node was removed and sent separately with a small amount of axillary fat.  Hemostasis was electrocautery.  The axillary envelope was closed with interrupted 2-0 Vicryl figure-of-eight sutures.  The adipose layer was closed with a running 2-0 Vicryl suture.  The skin closed with a running 4-0 Vicryl subcuticular suture.  Attention was returned to the breast.  Pathology reported no gross margin involvement.  The breast was then elevated off the underlying pectoralis muscle circumferentially for a distance of at least 5 cm.  The deep tissue was then approximated with interrupted 2-0 Vicryl figure-of-eight sutures.  The subcutaneous fat was approximated in similar fashion.  The skin was closed with a running 4-0 Vicryl subcuticular suture.  Benzoin and Steri-Strips followed by Telfa, fluff gauze and a compressive wrap were applied.  The patient tolerated the procedure well and was taken to the PACU in stable condition.
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
## 2021-08-31 NOTE — Anesthesia Procedure Notes (Signed)
Procedure Name: LMA Insertion Date/Time: 08/31/2021 9:58 AM  Performed by: Jonna Clark, CRNAPre-anesthesia Checklist: Patient identified, Patient being monitored, Timeout performed, Emergency Drugs available and Suction available Patient Re-evaluated:Patient Re-evaluated prior to induction Oxygen Delivery Method: Circle system utilized Preoxygenation: Pre-oxygenation with 100% oxygen Induction Type: IV induction Ventilation: Mask ventilation without difficulty LMA: LMA inserted LMA Size: 4.0 Tube type: Oral Number of attempts: 1 Placement Confirmation: positive ETCO2 and breath sounds checked- equal and bilateral Tube secured with: Tape Dental Injury: Teeth and Oropharynx as per pre-operative assessment

## 2021-08-31 NOTE — Anesthesia Preprocedure Evaluation (Addendum)
Anesthesia Evaluation  Patient identified by MRN, date of birth, ID band Patient awake    Reviewed: Allergy & Precautions, NPO status , Patient's Chart, lab work & pertinent test results  History of Anesthesia Complications Negative for: history of anesthetic complications  Airway Mallampati: IV   Neck ROM: Full    Dental  (+) Upper Dentures, Lower Dentures   Pulmonary asthma , COPD, Current Smoker (4-5 cigarettes per day) and Patient abstained from smoking.,    Pulmonary exam normal breath sounds clear to auscultation       Cardiovascular + CAD (s/p stents) and +CHF (cardiomyopathy, EF 35-40%)  Normal cardiovascular exam+ dysrhythmias (a fib on Eliquis) + Valvular Problems/Murmurs (ASD s/p repair)  Rhythm:Regular Rate:Normal  ECG 08/21/21:  Atrial fibrillation; IRBBB Axis (leads I and aVF): Normal Intervals: QRS 108 ms. QTc 507 ms. ST segment and T wave changes: Nonspecific ST and T wave changes. Comparison: Similar to previous tracing obtained on 06/11/2021  Echo 07/23/21:  1. Left ventricular ejection fraction, by estimation, is 50 to 55%. The left ventricle has low normal function. The left ventricle has no regional wall motion abnormalities. The left ventricular internal cavity size was mildly dilated. Left ventricular diastolic parameters are indeterminate.  2. Right ventricular systolic function is normal. The right ventricular size is normal. There is mildly elevated pulmonary artery systolic pressure. The estimated right ventricular systolic pressure is 64.3 mmHg.  3. Left atrial size was moderately dilated.  4. The mitral valve is normal in structure. Moderate to severe mitral valve regurgitation. No evidence of mitral stenosis.  5. Tricuspid valve regurgitation is moderate.  6. The aortic valve is normal in structure. Aortic valve regurgitation is not visualized. No aortic stenosis is present.  7. The inferior vena cava is  normal in size with greater than 50% respiratory variability, suggesting right atrial pressure of 3 mmHg.  8.  Atrial fibrillation noted  Myocardial perfusion 04/28/18:  1. Normal left ventricular systolic function with an EF of 55% 2. Normal wall motion 3. Mild aortic arch calcification noted on attenuation correction CT 4. Small fixed defect of mild intensity in the inferoapical region consistent with attenuation artifact 5. No evidence of significant stress-induced myocardial ischemia or arrhythmia; no scintigraphic evidence of scar 6. Study determined to be normal and low risk   Neuro/Psych PSYCHIATRIC DISORDERS Anxiety Depression Bipolar Disorder Hx alcohol use disorder, last intake 03/2021; hx cocaine use disorder, last use 2010    GI/Hepatic GERD  ,(+) Cirrhosis       ,   Endo/Other  diabetes, Type 2  Renal/GU      Musculoskeletal  (+) Arthritis ,   Abdominal   Peds  Hematology negative hematology ROS (+)   Anesthesia Other Findings Reviewed and agree with Bayard Males pre-anesthesia clinical review note.   Cardiology note 04/30/21:  1.  Persistent atrial fibrillation with rapid ventricular response: Patient recently admitted with rapid atrial fibrillation in the setting of sepsis and thyroid storm.  Previously on amiodarone, which was discontinued in December 2022 in the setting of hyperthyroidism.  During her hospitalization, she was managed with digoxin and propranolol.  Heart rate today is 87.  She has had some fatigue at home but overall has been relatively asymptomatic from a cardiac standpoint.  We again discussed deferring reinitiation of antiarrhythmic therapy or pursuing cardioversion in the setting of ongoing treatment for hyperthyroidism (TSH less than 0.05 on March 8).  I will check a digoxin level today and otherwise plan to continue current  doses of beta-blocker and digoxin.  2.  Cardiomyopathy: EF 35 to 40% by echocardiogram in the setting of rapid atrial  fibrillation during recent admission.  She is euvolemic on exam and not experiencing any significant dyspnea at home.  She is on long-acting propranolol therapy, losartan, spironolactone, and digoxin.  Plan to repeat echocardiogram following restoration of sinus rhythm in the future.  3.  Coronary artery disease: Status post circumflex stenting in May 2021.  Patent circumflex stent by catheterization October 2022 with otherwise nonobstructive disease.  She remains on beta-blocker and atorvastatin therapy.  No aspirin in the setting of Eliquis.  4.  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  She is now being managed by endocrinology and is on methimazole therapy.  5.  Alcohol abuse: She quit in late February and has remained off of alcohol since hospital discharge.  I congratulated her on this and encouraged her to remain off alcohol.  6.  Tobacco abuse: Patient has not gone back to cigarette since discharge from the hospital.  I congratulated her on this and encouraged her to remain off of cigarettes.  7.  Disposition: Follow-up digoxin level today.  Follow-up in approximately 6 weeks or sooner if necessary.  Reproductive/Obstetrics                            Anesthesia Physical Anesthesia Plan  ASA: 3  Anesthesia Plan: General   Post-op Pain Management:    Induction: Intravenous  PONV Risk Score and Plan: 2 and Ondansetron, Dexamethasone and Treatment may vary due to age or medical condition  Airway Management Planned: LMA  Additional Equipment:   Intra-op Plan:   Post-operative Plan: Extubation in OR  Informed Consent: I have reviewed the patients History and Physical, chart, labs and discussed the procedure including the risks, benefits and alternatives for the proposed anesthesia with the patient or authorized representative who has indicated his/her understanding and acceptance.     Dental advisory  given  Plan Discussed with: CRNA  Anesthesia Plan Comments: (Patient consented for risks of anesthesia including but not limited to:  - adverse reactions to medications - damage to eyes, teeth, lips or other oral mucosa - nerve damage due to positioning  - sore throat or hoarseness - damage to heart, brain, nerves, lungs, other parts of body or loss of life  Informed patient about role of CRNA in peri- and intra-operative care.  Patient voiced understanding.)        Anesthesia Quick Evaluation

## 2021-09-01 ENCOUNTER — Encounter: Payer: Self-pay | Admitting: General Surgery

## 2021-09-02 ENCOUNTER — Other Ambulatory Visit: Payer: Self-pay | Admitting: Pathology

## 2021-09-02 LAB — SURGICAL PATHOLOGY

## 2021-09-07 ENCOUNTER — Encounter: Payer: Self-pay | Admitting: *Deleted

## 2021-09-07 NOTE — Patient Instructions (Signed)
Call placed to [employer] to obtain prior authorization for Oncotype DX testing for patient. Prior Ariana Riggs is approved. Approval # [id number], valid 09/07/21-12/06/21. Approval has been faxed to [facility] at [phone].

## 2021-09-10 ENCOUNTER — Inpatient Hospital Stay (HOSPITAL_BASED_OUTPATIENT_CLINIC_OR_DEPARTMENT_OTHER): Payer: 59 | Admitting: Licensed Clinical Social Worker

## 2021-09-10 ENCOUNTER — Inpatient Hospital Stay: Payer: 59

## 2021-09-10 ENCOUNTER — Encounter: Payer: Self-pay | Admitting: Licensed Clinical Social Worker

## 2021-09-10 DIAGNOSIS — Z8049 Family history of malignant neoplasm of other genital organs: Secondary | ICD-10-CM

## 2021-09-10 DIAGNOSIS — Z800 Family history of malignant neoplasm of digestive organs: Secondary | ICD-10-CM

## 2021-09-10 DIAGNOSIS — C50412 Malignant neoplasm of upper-outer quadrant of left female breast: Secondary | ICD-10-CM | POA: Diagnosis not present

## 2021-09-10 DIAGNOSIS — Z170 Estrogen receptor positive status [ER+]: Secondary | ICD-10-CM

## 2021-09-10 DIAGNOSIS — Z803 Family history of malignant neoplasm of breast: Secondary | ICD-10-CM

## 2021-09-10 DIAGNOSIS — Z8042 Family history of malignant neoplasm of prostate: Secondary | ICD-10-CM
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
## 2021-09-11 ENCOUNTER — Encounter: Payer: Self-pay | Admitting: Oncology

## 2021-09-14 ENCOUNTER — Encounter: Payer: Self-pay | Admitting: Oncology

## 2021-09-15 ENCOUNTER — Ambulatory Visit
Admission: RE | Admit: 2021-09-15 | Discharge: 2021-09-15 | Disposition: A | Discharge status: Home / Self Care | Payer: 59 | Source: Ambulatory Visit | Attending: Oncology | Admitting: Oncology

## 2021-09-15 DIAGNOSIS — K7460 Unspecified cirrhosis of liver: Secondary | ICD-10-CM | POA: Insufficient documentation

## 2021-09-15 DIAGNOSIS — Z8042 Family history of malignant neoplasm of prostate: Secondary | ICD-10-CM | POA: Insufficient documentation

## 2021-09-15 DIAGNOSIS — E785 Hyperlipidemia, unspecified: Secondary | ICD-10-CM | POA: Insufficient documentation

## 2021-09-15 DIAGNOSIS — N183 Chronic kidney disease, stage 3 unspecified: Secondary | ICD-10-CM | POA: Insufficient documentation

## 2021-09-15 DIAGNOSIS — Z170 Estrogen receptor positive status [ER+]: Secondary | ICD-10-CM | POA: Diagnosis present

## 2021-09-15 DIAGNOSIS — C50412 Malignant neoplasm of upper-outer quadrant of left female breast: Secondary | ICD-10-CM | POA: Insufficient documentation

## 2021-09-15 DIAGNOSIS — I5022 Chronic systolic (congestive) heart failure: Secondary | ICD-10-CM | POA: Insufficient documentation

## 2021-09-15 DIAGNOSIS — Z7901 Long term (current) use of anticoagulants: Secondary | ICD-10-CM | POA: Insufficient documentation

## 2021-09-15 DIAGNOSIS — E1122 Type 2 diabetes mellitus with diabetic chronic kidney disease: Secondary | ICD-10-CM | POA: Insufficient documentation

## 2021-09-15 DIAGNOSIS — I2510 Atherosclerotic heart disease of native coronary artery without angina pectoris: Secondary | ICD-10-CM | POA: Insufficient documentation

## 2021-09-15 DIAGNOSIS — F17210 Nicotine dependence, cigarettes, uncomplicated: Secondary | ICD-10-CM | POA: Insufficient documentation

## 2021-09-15 DIAGNOSIS — Z8774 Personal history of (corrected) congenital malformations of heart and circulatory system: Secondary | ICD-10-CM | POA: Insufficient documentation

## 2021-09-15 DIAGNOSIS — I4819 Other persistent atrial fibrillation: Secondary | ICD-10-CM | POA: Insufficient documentation

## 2021-09-15 DIAGNOSIS — K219 Gastro-esophageal reflux disease without esophagitis: Secondary | ICD-10-CM | POA: Insufficient documentation

## 2021-09-15 DIAGNOSIS — Z510 Encounter for antineoplastic radiation therapy: Secondary | ICD-10-CM | POA: Insufficient documentation

## 2021-09-15 DIAGNOSIS — G4730 Sleep apnea, unspecified: Secondary | ICD-10-CM | POA: Insufficient documentation

## 2021-09-15 DIAGNOSIS — Z8616 Personal history of COVID-19: Secondary | ICD-10-CM | POA: Insufficient documentation

## 2021-09-15 DIAGNOSIS — Z79899 Other long term (current) drug therapy: Secondary | ICD-10-CM | POA: Insufficient documentation

## 2021-09-15 DIAGNOSIS — J449 Chronic obstructive pulmonary disease, unspecified: Secondary | ICD-10-CM | POA: Insufficient documentation

## 2021-09-15 DIAGNOSIS — Z803 Family history of malignant neoplasm of breast: Secondary | ICD-10-CM | POA: Insufficient documentation

## 2021-09-15 DIAGNOSIS — Z79811 Long term (current) use of aromatase inhibitors: Secondary | ICD-10-CM | POA: Insufficient documentation

## 2021-09-16 ENCOUNTER — Other Ambulatory Visit: Payer: Self-pay | Admitting: *Deleted

## 2021-09-16 ENCOUNTER — Ambulatory Visit
Admission: RE | Admit: 2021-09-16 | Discharge: 2021-09-16 | Disposition: A | Discharge status: Home / Self Care | Payer: 59 | Source: Ambulatory Visit | Attending: Radiation Oncology | Admitting: Radiation Oncology

## 2021-09-16 ENCOUNTER — Inpatient Hospital Stay (HOSPITAL_BASED_OUTPATIENT_CLINIC_OR_DEPARTMENT_OTHER): Payer: 59 | Admitting: Oncology

## 2021-09-16 ENCOUNTER — Encounter: Payer: Self-pay | Admitting: Oncology

## 2021-09-16 VITALS — BP 110/70 | HR 88 | Temp 98.7°F | Resp 20 | Wt 181.3 lb

## 2021-09-16 DIAGNOSIS — J449 Chronic obstructive pulmonary disease, unspecified: Secondary | ICD-10-CM | POA: Insufficient documentation

## 2021-09-16 DIAGNOSIS — I2510 Atherosclerotic heart disease of native coronary artery without angina pectoris: Secondary | ICD-10-CM | POA: Insufficient documentation

## 2021-09-16 DIAGNOSIS — N183 Chronic kidney disease, stage 3 unspecified: Secondary | ICD-10-CM | POA: Insufficient documentation

## 2021-09-16 DIAGNOSIS — I4819 Other persistent atrial fibrillation: Secondary | ICD-10-CM | POA: Insufficient documentation

## 2021-09-16 DIAGNOSIS — F17210 Nicotine dependence, cigarettes, uncomplicated: Secondary | ICD-10-CM | POA: Insufficient documentation

## 2021-09-16 DIAGNOSIS — Z79811 Long term (current) use of aromatase inhibitors: Secondary | ICD-10-CM | POA: Insufficient documentation

## 2021-09-16 DIAGNOSIS — Z170 Estrogen receptor positive status [ER+]: Secondary | ICD-10-CM | POA: Insufficient documentation

## 2021-09-16 DIAGNOSIS — E785 Hyperlipidemia, unspecified: Secondary | ICD-10-CM | POA: Insufficient documentation

## 2021-09-16 DIAGNOSIS — Z803 Family history of malignant neoplasm of breast: Secondary | ICD-10-CM | POA: Insufficient documentation

## 2021-09-16 DIAGNOSIS — C50812 Malignant neoplasm of overlapping sites of left female breast: Secondary | ICD-10-CM | POA: Insufficient documentation

## 2021-09-16 DIAGNOSIS — Z8616 Personal history of COVID-19: Secondary | ICD-10-CM | POA: Insufficient documentation

## 2021-09-16 DIAGNOSIS — Z8774 Personal history of (corrected) congenital malformations of heart and circulatory system: Secondary | ICD-10-CM | POA: Insufficient documentation

## 2021-09-16 DIAGNOSIS — C50412 Malignant neoplasm of upper-outer quadrant of left female breast: Secondary | ICD-10-CM

## 2021-09-16 DIAGNOSIS — Z510 Encounter for antineoplastic radiation therapy: Secondary | ICD-10-CM | POA: Diagnosis not present

## 2021-09-16 DIAGNOSIS — K7460 Unspecified cirrhosis of liver: Secondary | ICD-10-CM | POA: Insufficient documentation

## 2021-09-16 DIAGNOSIS — K219 Gastro-esophageal reflux disease without esophagitis: Secondary | ICD-10-CM | POA: Insufficient documentation

## 2021-09-16 DIAGNOSIS — Z7189 Other specified counseling: Secondary | ICD-10-CM

## 2021-09-16 DIAGNOSIS — G4730 Sleep apnea, unspecified: Secondary | ICD-10-CM | POA: Insufficient documentation

## 2021-09-16 DIAGNOSIS — Z7901 Long term (current) use of anticoagulants: Secondary | ICD-10-CM | POA: Insufficient documentation

## 2021-09-16 DIAGNOSIS — Z8042 Family history of malignant neoplasm of prostate: Secondary | ICD-10-CM | POA: Insufficient documentation

## 2021-09-16 DIAGNOSIS — Z79899 Other long term (current) drug therapy: Secondary | ICD-10-CM | POA: Insufficient documentation

## 2021-09-16 DIAGNOSIS — E1122 Type 2 diabetes mellitus with diabetic chronic kidney disease: Secondary | ICD-10-CM | POA: Insufficient documentation

## 2021-09-16 DIAGNOSIS — I5022 Chronic systolic (congestive) heart failure: Secondary | ICD-10-CM | POA: Insufficient documentation

## 2021-09-16 MED ORDER — ANASTROZOLE 1 MG PO TABS: 1.0000 mg | ORAL_TABLET | Freq: Every day | ORAL | 3 refills | Status: DC
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
## 2021-09-21 ENCOUNTER — Ambulatory Visit: Payer: 59 | Admitting: Medical

## 2021-09-22 ENCOUNTER — Other Ambulatory Visit: Payer: Self-pay | Admitting: Internal Medicine

## 2021-09-24 ENCOUNTER — Telehealth: Payer: Self-pay | Admitting: Internal Medicine

## 2021-09-24 NOTE — Telephone Encounter (Signed)
Patient called states she received a call last week about coming in for lab work.  She wants to make sure if it's okay to come in this coming Monday 8/14.

## 2021-09-24 NOTE — Telephone Encounter (Signed)
Pt notified ok to have labs Monday. This is for repeat digoxin level. Pt was unable to travel to [place] this week and will have repeat lab done Monday same day she has to be here for another procedure.

## 2021-09-28 ENCOUNTER — Other Ambulatory Visit
Admission: RE | Admit: 2021-09-28 | Discharge: 2021-09-28 | Disposition: A | Discharge status: Home / Self Care | Payer: 59 | Attending: Internal Medicine | Admitting: Internal Medicine

## 2021-09-28 ENCOUNTER — Ambulatory Visit
Admission: RE | Admit: 2021-09-28 | Discharge: 2021-09-28 | Disposition: A | Discharge status: Home / Self Care | Payer: 59 | Source: Ambulatory Visit | Attending: Radiation Oncology | Admitting: Radiation Oncology

## 2021-09-28 DIAGNOSIS — Z79899 Other long term (current) drug therapy: Secondary | ICD-10-CM | POA: Insufficient documentation

## 2021-09-28 DIAGNOSIS — I4819 Other persistent atrial fibrillation: Secondary | ICD-10-CM | POA: Diagnosis present

## 2021-09-28 DIAGNOSIS — Z510 Encounter for antineoplastic radiation therapy: Secondary | ICD-10-CM | POA: Diagnosis not present

## 2021-09-28 LAB — DIGOXIN LEVEL: Digoxin Level: 0.7 ng/mL — ABNORMAL LOW (ref 0.8–2.0)

## 2021-09-29 ENCOUNTER — Telehealth: Payer: Self-pay | Admitting: Licensed Clinical Social Worker

## 2021-09-29 ENCOUNTER — Encounter: Payer: Self-pay | Admitting: Licensed Clinical Social Worker

## 2021-09-29 ENCOUNTER — Ambulatory Visit: Payer: Self-pay | Admitting: Licensed Clinical Social Worker

## 2021-09-29 DIAGNOSIS — Z1379 Encounter for other screening for genetic and chromosomal anomalies: Secondary | ICD-10-CM

## 2021-09-29 NOTE — Telephone Encounter (Signed)
I contacted Ms. Scullin to discuss her genetic testing results. No pathogenic variants were identified in the 47 genes analyzed. Detailed clinic note to follow.   The test report has been scanned into EPIC and is located under the Molecular Pathology section of the Results Review tab.  A portion of the result report is included below for reference.       Faith Rogue, MS, [facility] Genetic Counselor [email or web address] Phone: [phone]

## 2021-09-29 NOTE — Progress Notes (Signed)
HPI:   Ms. Rekowski was previously seen in the [facility] clinic due to a personal and family history of cancer and concerns regarding a hereditary predisposition to cancer. Please refer to our prior cancer genetics clinic note for more information regarding our discussion, assessment and recommendations, at the time. Ms. Gershman recent genetic test results were disclosed to her, as were recommendations warranted by these results. These results and recommendations are discussed in more detail below.  CANCER HISTORY:  Oncology History  Malignant neoplasm of upper-outer quadrant of left breast in female, estrogen receptor positive ([place])  08/24/2021 Initial Diagnosis   Malignant neoplasm of upper-outer quadrant of left breast in female, estrogen receptor positive ([place])   08/24/2021 Cancer Staging   Staging form: Breast, AJCC 8th Edition - Clinical stage from 08/24/2021: Stage IA (cT1c, cN0, cM0, G1, ER+, PR+, HER2-) - Signed by Sindy Guadeloupe, MD on 08/24/2021 Histologic grading system: 3 grade system   09/16/2021 Cancer Staging   Staging form: Breast, AJCC 8th Edition - Pathologic stage from 09/16/2021: Stage IA (pT1b, pN0, cM0, G2, ER+, PR+, HER2-, Oncotype DX score: 16) - Signed by Sindy Guadeloupe, MD on 09/16/2021 Multigene prognostic tests performed: Oncotype DX Recurrence score range: Greater than or equal to 11 Histologic grading system: 3 grade system    Genetic Testing   Negative genetic testing. No pathogenic variants identified on the [facility] Common Hereditary Cancers+RNA panel. 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 The report date is 09/27/2021.  The Common Hereditary Cancers Panel + RNA offered by [facility] includes sequencing and/or deletion duplication testing of the following 47 genes: APC, ATM, AXIN2, BARD1, BMPR1A, BRCA1, BRCA2, BRIP1, CDH1, CDKN2A (p14ARF), CDKN2A (p16INK4a), CKD4, CHEK2, CTNNA1, DICER1, EPCAM (Deletion/duplication testing only), GREM1 (promoter region  deletion/duplication testing only), KIT, MEN1, MLH1, MSH2, MSH3, MSH6, MUTYH, NBN, NF1, NHTL1, PALB2, PDGFRA, PMS2, POLD1, POLE, PTEN, RAD50, RAD51C, RAD51D, SDHB, SDHC, SDHD, SMAD4, SMARCA4. STK11, TP53, TSC1, TSC2, and VHL.  The following genes were evaluated for sequence changes only: SDHA and HOXB13 c.251G>A variant only.     FAMILY HISTORY:  We obtained a detailed, 4-generation family history.  Significant diagnoses are listed below: Family History  Problem Relation Age of Onset   Hypertension Mother    Diabetes Mother    Peripheral Artery Disease Mother    Dementia Father    Hypertension Father    Prostate cancer Father        dx 35s   Heart attack Sister    Peripheral Artery Disease Sister    Thyroid cancer Maternal Grandmother    Breast cancer Cousin 57   Uterine cancer Cousin        dx 67s   Pancreatic cancer Cousin   Ms. Pancoast has 2 sons, 59 and 69. She has 3 brothers, 1 sister, no cancers.   Ms. Corrigan mother passed at 65. Patient had 2 maternal aunts, 1 uncle. A maternal cousin had breast cancer at 58, another cousin had pancreatic cancer and passed from it, and another cousin had uterine cancer in her 84s. Maternal grandmother had thyroid cancer.    Ms. Snare father had prostate cancer in his 84s and is living at 41. No other known cancers on this side of the family.   Ms. Mattera is unaware of previous family history of genetic testing for hereditary cancer risks. There is no reported Ashkenazi Jewish ancestry. There is no known consanguinity.    GENETIC TEST RESULTS:  The [facility] Common Hereditary Cancers+RNA Panel found  no pathogenic mutations.   The Common Hereditary Cancers Panel + RNA offered by [facility] includes sequencing and/or deletion duplication testing of the following 47 genes: APC, ATM, AXIN2, BARD1, BMPR1A, BRCA1, BRCA2, BRIP1, CDH1, CDKN2A (p14ARF), CDKN2A (p16INK4a), CKD4, CHEK2, CTNNA1, DICER1, EPCAM (Deletion/duplication testing only), GREM1  (promoter region deletion/duplication testing only), KIT, MEN1, MLH1, MSH2, MSH3, MSH6, MUTYH, NBN, NF1, NHTL1, PALB2, PDGFRA, PMS2, POLD1, POLE, PTEN, RAD50, RAD51C, RAD51D, SDHB, SDHC, SDHD, SMAD4, SMARCA4. STK11, TP53, TSC1, TSC2, and VHL.  The following genes were evaluated for sequence changes only: SDHA and HOXB13 c.251G>A variant only.  The test report has been scanned into EPIC and is located under the Molecular Pathology section of the Results Review tab.  A portion of the result report is included below for reference. Genetic testing reported out on 09/27/2021.      Genetic testing identified a variant of uncertain significance (VUS) in the MSH6 gene called c.535G>T.  At this time, it is unknown if this variant is associated with an increased risk for cancer or if it is benign, but most uncertain variants are reclassified to benign. It should not be used to make medical management decisions. With time, we suspect the laboratory will determine the significance of this variant, if any. If the laboratory reclassifies this variant, we will attempt to contact Ms. Kyler to discuss it further.   Even though a pathogenic variant was not identified, possible explanations for the cancer in the family may include: There may be no hereditary risk for cancer in the family. The cancers in Ms. Biebel and/or her family may be sporadic/familial or due to other genetic and environmental factors. There may be a gene mutation in one of these genes that current testing methods cannot detect but that chance is small. There could be another gene that has not yet been discovered, or that we have not yet tested, that is responsible for the cancer diagnoses in the family.  It is also possible there is a hereditary cause for the cancer in the family that Ms. Ohaver did not inherit. The variant of uncertain significance detected in the MSH6 gene may be reclassified as a pathogenic variant in the future. At this time, we do  not know if this variant increases the risk for cancer.  Therefore, it is important to remain in touch with cancer genetics in the future so that we can continue to offer Ms. Peddy the most up to date genetic testing.   ADDITIONAL GENETIC TESTING:  We discussed with Ms. Arcand that her genetic testing was fairly extensive.  If there are additional relevant genes identified to increase cancer risk that can be analyzed in the future, we would be happy to discuss and coordinate this testing at that time.    CANCER SCREENING RECOMMENDATIONS:  Ms. Landa test result is considered negative (normal).  This means that we have not identified a hereditary cause for her personal and family history of cancer at this time.   An individual's cancer risk and medical management are not determined by genetic test results alone. Overall cancer risk assessment incorporates additional factors, including personal medical history, family history, and any available genetic information that may result in a personalized plan for cancer prevention and surveillance. Therefore, it is recommended she continue to follow the cancer management and screening guidelines provided by her oncology and primary healthcare provider.  RECOMMENDATIONS FOR FAMILY MEMBERS:   Since she did not inherit a identifiable mutation in a cancer predisposition gene included on  this panel, her children could not have inherited a known mutation from her in one of these genes. Individuals in this family might be at some increased risk of developing cancer, over the general population risk, due to the family history of cancer.  Individuals in the family should notify their providers of the family history of cancer. We recommend women in this family have a yearly mammogram beginning at age 79, or 77 years younger than the earliest onset of cancer, an annual clinical breast exam, and perform monthly breast self-exams.  Family members should have colonoscopies  by at age 52, or earlier, as recommended by their providers. Other members of the family may still carry a pathogenic variant in one of these genes that Ms. Bazinet did not inherit. Based on the family history, we recommend her maternal cousins who have had cancer have genetic counseling and testing. Ms. Denison will let us know if we can be of any assistance in coordinating genetic counseling and/or testing for this family member.   We do not recommend familial testing for the MSH6 variant of uncertain significance (VUS).  FOLLOW-UP:  Lastly, we discussed with Ms. Posner that cancer genetics is a rapidly advancing field and it is possible that new genetic tests will be appropriate for her and/or her family members in the future. We encouraged her to remain in contact with cancer genetics on an annual basis so we can update her personal and family histories and let her know of advances in cancer genetics that may benefit this family.   Our contact number was provided. Ms. Quint questions were answered to her satisfaction, and she knows she is welcome to call us at anytime with additional questions or concerns.    Faith Rogue, MS, [facility] Genetic Counselor [email or web address] Phone: [phone]

## 2021-09-30 DIAGNOSIS — Z510 Encounter for antineoplastic radiation therapy: Secondary | ICD-10-CM | POA: Diagnosis not present

## 2021-10-02 ENCOUNTER — Other Ambulatory Visit: Payer: Self-pay | Admitting: *Deleted

## 2021-10-02 ENCOUNTER — Ambulatory Visit: Payer: 59 | Admitting: Internal Medicine

## 2021-10-02 DIAGNOSIS — Z170 Estrogen receptor positive status [ER+]: Secondary | ICD-10-CM

## 2021-10-04 ENCOUNTER — Other Ambulatory Visit: Payer: Self-pay | Admitting: Internal Medicine

## 2021-10-06 ENCOUNTER — Ambulatory Visit
Admission: RE | Admit: 2021-10-06 | Discharge: 2021-10-06 | Disposition: A | Discharge status: Home / Self Care | Payer: 59 | Source: Ambulatory Visit | Attending: Radiation Oncology | Admitting: Radiation Oncology

## 2021-10-06 DIAGNOSIS — Z510 Encounter for antineoplastic radiation therapy: Secondary | ICD-10-CM | POA: Diagnosis not present

## 2021-10-07 ENCOUNTER — Ambulatory Visit
Admission: RE | Admit: 2021-10-07 | Discharge: 2021-10-07 | Disposition: A | Discharge status: Home / Self Care | Payer: 59 | Source: Ambulatory Visit | Attending: Radiation Oncology | Admitting: Radiation Oncology

## 2021-10-07 ENCOUNTER — Other Ambulatory Visit: Payer: Self-pay

## 2021-10-07 DIAGNOSIS — Z510 Encounter for antineoplastic radiation therapy: Secondary | ICD-10-CM | POA: Diagnosis not present

## 2021-10-07 LAB — RAD ONC ARIA SESSION SUMMARY
Course Elapsed Days: 0
Plan Fractions Treated to Date: 1
Plan Prescribed Dose Per Fraction: 2.66 Gy
Plan Total Fractions Prescribed: 16
Plan Total Prescribed Dose: 42.56 Gy
Reference Point Dosage Given to Date: 2.66 Gy
Reference Point Session Dosage Given: 2.66 Gy
Session Number: 1

## 2021-10-08 ENCOUNTER — Ambulatory Visit
Admission: RE | Admit: 2021-10-08 | Discharge: 2021-10-08 | Disposition: A | Discharge status: Home / Self Care | Payer: 59 | Source: Ambulatory Visit | Attending: Radiation Oncology | Admitting: Radiation Oncology

## 2021-10-08 ENCOUNTER — Encounter: Payer: Self-pay | Admitting: *Deleted

## 2021-10-08 ENCOUNTER — Other Ambulatory Visit: Payer: Self-pay

## 2021-10-08 DIAGNOSIS — Z510 Encounter for antineoplastic radiation therapy: Secondary | ICD-10-CM | POA: Diagnosis not present

## 2021-10-08 LAB — RAD ONC ARIA SESSION SUMMARY
Course Elapsed Days: 1
Plan Fractions Treated to Date: 2
Plan Prescribed Dose Per Fraction: 2.66 Gy
Plan Total Fractions Prescribed: 16
Plan Total Prescribed Dose: 42.56 Gy
Reference Point Dosage Given to Date: 5.32 Gy
Reference Point Session Dosage Given: 2.66 Gy
Session Number: 2

## 2021-10-09 ENCOUNTER — Other Ambulatory Visit: Payer: Self-pay

## 2021-10-09 ENCOUNTER — Ambulatory Visit
Admission: RE | Admit: 2021-10-09 | Discharge: 2021-10-09 | Disposition: A | Discharge status: Home / Self Care | Payer: 59 | Source: Ambulatory Visit | Attending: Radiation Oncology | Admitting: Radiation Oncology

## 2021-10-09 DIAGNOSIS — Z510 Encounter for antineoplastic radiation therapy: Secondary | ICD-10-CM | POA: Diagnosis not present

## 2021-10-09 LAB — RAD ONC ARIA SESSION SUMMARY
Course Elapsed Days: 2
Plan Fractions Treated to Date: 3
Plan Prescribed Dose Per Fraction: 2.66 Gy
Plan Total Fractions Prescribed: 16
Plan Total Prescribed Dose: 42.56 Gy
Reference Point Dosage Given to Date: 7.98 Gy
Reference Point Session Dosage Given: 2.66 Gy
Session Number: 3

## 2021-10-12 ENCOUNTER — Other Ambulatory Visit: Payer: Self-pay

## 2021-10-12 ENCOUNTER — Ambulatory Visit
Admission: RE | Admit: 2021-10-12 | Discharge: 2021-10-12 | Disposition: A | Discharge status: Home / Self Care | Payer: 59 | Source: Ambulatory Visit | Attending: Radiation Oncology | Admitting: Radiation Oncology

## 2021-10-12 DIAGNOSIS — Z510 Encounter for antineoplastic radiation therapy: Secondary | ICD-10-CM | POA: Diagnosis not present

## 2021-10-12 LAB — RAD ONC ARIA SESSION SUMMARY
Course Elapsed Days: 5
Plan Fractions Treated to Date: 4
Plan Prescribed Dose Per Fraction: 2.66 Gy
Plan Total Fractions Prescribed: 16
Plan Total Prescribed Dose: 42.56 Gy
Reference Point Dosage Given to Date: 10.64 Gy
Reference Point Session Dosage Given: 2.66 Gy
Session Number: 4

## 2021-10-13 ENCOUNTER — Other Ambulatory Visit: Payer: Self-pay

## 2021-10-13 ENCOUNTER — Ambulatory Visit
Admission: RE | Admit: 2021-10-13 | Discharge: 2021-10-13 | Disposition: A | Discharge status: Home / Self Care | Payer: 59 | Source: Ambulatory Visit | Attending: Radiation Oncology | Admitting: Radiation Oncology

## 2021-10-13 ENCOUNTER — Encounter: Payer: Self-pay | Admitting: Genetic Counselor

## 2021-10-13 DIAGNOSIS — Z510 Encounter for antineoplastic radiation therapy: Secondary | ICD-10-CM | POA: Diagnosis not present

## 2021-10-13 LAB — RAD ONC ARIA SESSION SUMMARY
Course Elapsed Days: 6
Plan Fractions Treated to Date: 5
Plan Prescribed Dose Per Fraction: 2.66 Gy
Plan Total Fractions Prescribed: 16
Plan Total Prescribed Dose: 42.56 Gy
Reference Point Dosage Given to Date: 13.3 Gy
Reference Point Session Dosage Given: 2.66 Gy
Session Number: 5

## 2021-10-14 ENCOUNTER — Inpatient Hospital Stay: Payer: 59

## 2021-10-14 ENCOUNTER — Other Ambulatory Visit: Payer: Self-pay

## 2021-10-14 ENCOUNTER — Ambulatory Visit
Admission: RE | Admit: 2021-10-14 | Discharge: 2021-10-14 | Disposition: A | Discharge status: Home / Self Care | Payer: 59 | Source: Ambulatory Visit | Attending: Radiation Oncology | Admitting: Radiation Oncology

## 2021-10-14 DIAGNOSIS — Z510 Encounter for antineoplastic radiation therapy: Secondary | ICD-10-CM | POA: Diagnosis not present

## 2021-10-14 DIAGNOSIS — Z170 Estrogen receptor positive status [ER+]: Secondary | ICD-10-CM

## 2021-10-14 LAB — CBC
HCT: 40.4 % (ref 36.0–46.0)
Hemoglobin: 14.4 g/dL (ref 12.0–15.0)
MCH: 33 pg (ref 26.0–34.0)
MCHC: 35.6 g/dL (ref 30.0–36.0)
MCV: 92.4 fL (ref 80.0–100.0)
Platelets: 138 10*3/uL — ABNORMAL LOW (ref 150–400)
RBC: 4.37 MIL/uL (ref 3.87–5.11)
RDW: 14.8 % (ref 11.5–15.5)
WBC: 6.1 10*3/uL (ref 4.0–10.5)
nRBC: 0 % (ref 0.0–0.2)

## 2021-10-14 LAB — RAD ONC ARIA SESSION SUMMARY
Course Elapsed Days: 7
Plan Fractions Treated to Date: 6
Plan Prescribed Dose Per Fraction: 2.66 Gy
Plan Total Fractions Prescribed: 16
Plan Total Prescribed Dose: 42.56 Gy
Reference Point Dosage Given to Date: 15.96 Gy
Reference Point Session Dosage Given: 2.66 Gy
Session Number: 6

## 2021-10-15 ENCOUNTER — Other Ambulatory Visit: Payer: Self-pay

## 2021-10-15 ENCOUNTER — Ambulatory Visit
Admission: RE | Admit: 2021-10-15 | Discharge: 2021-10-15 | Disposition: A | Discharge status: Home / Self Care | Payer: 59 | Source: Ambulatory Visit | Attending: Radiation Oncology | Admitting: Radiation Oncology

## 2021-10-15 DIAGNOSIS — Z510 Encounter for antineoplastic radiation therapy: Secondary | ICD-10-CM | POA: Diagnosis not present

## 2021-10-15 LAB — RAD ONC ARIA SESSION SUMMARY
Course Elapsed Days: 8
Plan Fractions Treated to Date: 7
Plan Prescribed Dose Per Fraction: 2.66 Gy
Plan Total Fractions Prescribed: 16
Plan Total Prescribed Dose: 42.56 Gy
Reference Point Dosage Given to Date: 18.62 Gy
Reference Point Session Dosage Given: 2.66 Gy
Session Number: 7

## 2021-10-16 ENCOUNTER — Ambulatory Visit
Admission: RE | Admit: 2021-10-16 | Discharge: 2021-10-16 | Disposition: A | Discharge status: Home / Self Care | Payer: 59 | Source: Ambulatory Visit | Attending: Radiation Oncology | Admitting: Radiation Oncology

## 2021-10-16 ENCOUNTER — Other Ambulatory Visit: Payer: Self-pay

## 2021-10-16 DIAGNOSIS — K219 Gastro-esophageal reflux disease without esophagitis: Secondary | ICD-10-CM | POA: Insufficient documentation

## 2021-10-16 DIAGNOSIS — Z170 Estrogen receptor positive status [ER+]: Secondary | ICD-10-CM | POA: Insufficient documentation

## 2021-10-16 DIAGNOSIS — G4730 Sleep apnea, unspecified: Secondary | ICD-10-CM | POA: Insufficient documentation

## 2021-10-16 DIAGNOSIS — I4819 Other persistent atrial fibrillation: Secondary | ICD-10-CM | POA: Insufficient documentation

## 2021-10-16 DIAGNOSIS — C50812 Malignant neoplasm of overlapping sites of left female breast: Secondary | ICD-10-CM | POA: Insufficient documentation

## 2021-10-16 DIAGNOSIS — K7460 Unspecified cirrhosis of liver: Secondary | ICD-10-CM | POA: Insufficient documentation

## 2021-10-16 DIAGNOSIS — N183 Chronic kidney disease, stage 3 unspecified: Secondary | ICD-10-CM | POA: Insufficient documentation

## 2021-10-16 DIAGNOSIS — Z79811 Long term (current) use of aromatase inhibitors: Secondary | ICD-10-CM | POA: Insufficient documentation

## 2021-10-16 DIAGNOSIS — Z8774 Personal history of (corrected) congenital malformations of heart and circulatory system: Secondary | ICD-10-CM | POA: Insufficient documentation

## 2021-10-16 DIAGNOSIS — C50412 Malignant neoplasm of upper-outer quadrant of left female breast: Secondary | ICD-10-CM | POA: Insufficient documentation

## 2021-10-16 DIAGNOSIS — J449 Chronic obstructive pulmonary disease, unspecified: Secondary | ICD-10-CM | POA: Insufficient documentation

## 2021-10-16 DIAGNOSIS — Z8616 Personal history of COVID-19: Secondary | ICD-10-CM | POA: Insufficient documentation

## 2021-10-16 DIAGNOSIS — E785 Hyperlipidemia, unspecified: Secondary | ICD-10-CM | POA: Insufficient documentation

## 2021-10-16 DIAGNOSIS — I2510 Atherosclerotic heart disease of native coronary artery without angina pectoris: Secondary | ICD-10-CM | POA: Insufficient documentation

## 2021-10-16 DIAGNOSIS — Z8042 Family history of malignant neoplasm of prostate: Secondary | ICD-10-CM | POA: Insufficient documentation

## 2021-10-16 DIAGNOSIS — Z510 Encounter for antineoplastic radiation therapy: Secondary | ICD-10-CM | POA: Insufficient documentation

## 2021-10-16 DIAGNOSIS — Z79899 Other long term (current) drug therapy: Secondary | ICD-10-CM | POA: Insufficient documentation

## 2021-10-16 DIAGNOSIS — F17210 Nicotine dependence, cigarettes, uncomplicated: Secondary | ICD-10-CM | POA: Insufficient documentation

## 2021-10-16 DIAGNOSIS — Z803 Family history of malignant neoplasm of breast: Secondary | ICD-10-CM | POA: Insufficient documentation

## 2021-10-16 DIAGNOSIS — E1122 Type 2 diabetes mellitus with diabetic chronic kidney disease: Secondary | ICD-10-CM | POA: Insufficient documentation

## 2021-10-16 DIAGNOSIS — I5022 Chronic systolic (congestive) heart failure: Secondary | ICD-10-CM | POA: Insufficient documentation

## 2021-10-16 DIAGNOSIS — Z7901 Long term (current) use of anticoagulants: Secondary | ICD-10-CM | POA: Insufficient documentation

## 2021-10-16 LAB — RAD ONC ARIA SESSION SUMMARY
Course Elapsed Days: 9
Plan Fractions Treated to Date: 8
Plan Prescribed Dose Per Fraction: 2.66 Gy
Plan Total Fractions Prescribed: 16
Plan Total Prescribed Dose: 42.56 Gy
Reference Point Dosage Given to Date: 21.28 Gy
Reference Point Session Dosage Given: 2.66 Gy
Session Number: 8

## 2021-10-20 ENCOUNTER — Ambulatory Visit
Admission: RE | Admit: 2021-10-20 | Discharge: 2021-10-20 | Disposition: A | Discharge status: Home / Self Care | Payer: 59 | Source: Ambulatory Visit | Attending: Radiation Oncology | Admitting: Radiation Oncology

## 2021-10-20 ENCOUNTER — Other Ambulatory Visit: Payer: Self-pay

## 2021-10-20 DIAGNOSIS — Z510 Encounter for antineoplastic radiation therapy: Secondary | ICD-10-CM | POA: Diagnosis not present

## 2021-10-20 LAB — RAD ONC ARIA SESSION SUMMARY
Course Elapsed Days: 13
Plan Fractions Treated to Date: 9
Plan Prescribed Dose Per Fraction: 2.66 Gy
Plan Total Fractions Prescribed: 16
Plan Total Prescribed Dose: 42.56 Gy
Reference Point Dosage Given to Date: 23.94 Gy
Reference Point Session Dosage Given: 2.66 Gy
Session Number: 9

## 2021-10-21 ENCOUNTER — Ambulatory Visit
Admission: RE | Admit: 2021-10-21 | Discharge: 2021-10-21 | Disposition: A | Discharge status: Home / Self Care | Payer: 59 | Source: Ambulatory Visit | Attending: Radiation Oncology | Admitting: Radiation Oncology

## 2021-10-21 ENCOUNTER — Other Ambulatory Visit: Payer: Self-pay

## 2021-10-21 DIAGNOSIS — Z510 Encounter for antineoplastic radiation therapy: Secondary | ICD-10-CM | POA: Diagnosis not present

## 2021-10-21 LAB — RAD ONC ARIA SESSION SUMMARY
Course Elapsed Days: 14
Plan Fractions Treated to Date: 10
Plan Prescribed Dose Per Fraction: 2.66 Gy
Plan Total Fractions Prescribed: 16
Plan Total Prescribed Dose: 42.56 Gy
Reference Point Dosage Given to Date: 26.6 Gy
Reference Point Session Dosage Given: 2.66 Gy
Session Number: 10

## 2021-10-22 ENCOUNTER — Ambulatory Visit
Admission: RE | Admit: 2021-10-22 | Discharge: 2021-10-22 | Disposition: A | Discharge status: Home / Self Care | Payer: 59 | Source: Ambulatory Visit | Attending: Radiation Oncology | Admitting: Radiation Oncology

## 2021-10-22 ENCOUNTER — Other Ambulatory Visit: Payer: Self-pay

## 2021-10-22 DIAGNOSIS — Z510 Encounter for antineoplastic radiation therapy: Secondary | ICD-10-CM | POA: Diagnosis not present

## 2021-10-22 LAB — RAD ONC ARIA SESSION SUMMARY
Course Elapsed Days: 15
Plan Fractions Treated to Date: 11
Plan Prescribed Dose Per Fraction: 2.66 Gy
Plan Total Fractions Prescribed: 16
Plan Total Prescribed Dose: 42.56 Gy
Reference Point Dosage Given to Date: 29.26 Gy
Reference Point Session Dosage Given: 2.66 Gy
Session Number: 11

## 2021-10-23 ENCOUNTER — Other Ambulatory Visit: Payer: Self-pay

## 2021-10-23 ENCOUNTER — Ambulatory Visit
Admission: RE | Admit: 2021-10-23 | Discharge: 2021-10-23 | Disposition: A | Discharge status: Home / Self Care | Payer: 59 | Source: Ambulatory Visit | Attending: Radiation Oncology | Admitting: Radiation Oncology

## 2021-10-23 DIAGNOSIS — Z510 Encounter for antineoplastic radiation therapy: Secondary | ICD-10-CM | POA: Diagnosis not present

## 2021-10-23 LAB — RAD ONC ARIA SESSION SUMMARY
Course Elapsed Days: 16
Plan Fractions Treated to Date: 12
Plan Prescribed Dose Per Fraction: 2.66 Gy
Plan Total Fractions Prescribed: 16
Plan Total Prescribed Dose: 42.56 Gy
Reference Point Dosage Given to Date: 31.92 Gy
Reference Point Session Dosage Given: 2.66 Gy
Session Number: 12

## 2021-10-26 ENCOUNTER — Other Ambulatory Visit: Payer: Self-pay

## 2021-10-26 ENCOUNTER — Ambulatory Visit
Admission: RE | Admit: 2021-10-26 | Discharge: 2021-10-26 | Disposition: A | Discharge status: Home / Self Care | Payer: 59 | Source: Ambulatory Visit | Attending: Radiation Oncology | Admitting: Radiation Oncology

## 2021-10-26 DIAGNOSIS — Z510 Encounter for antineoplastic radiation therapy: Secondary | ICD-10-CM | POA: Diagnosis not present

## 2021-10-26 LAB — RAD ONC ARIA SESSION SUMMARY
Course Elapsed Days: 19
Plan Fractions Treated to Date: 13
Plan Prescribed Dose Per Fraction: 2.66 Gy
Plan Total Fractions Prescribed: 16
Plan Total Prescribed Dose: 42.56 Gy
Reference Point Dosage Given to Date: 34.58 Gy
Reference Point Session Dosage Given: 2.66 Gy
Session Number: 13

## 2021-10-27 ENCOUNTER — Other Ambulatory Visit: Payer: Self-pay

## 2021-10-27 ENCOUNTER — Ambulatory Visit
Admission: RE | Admit: 2021-10-27 | Discharge: 2021-10-27 | Disposition: A | Discharge status: Home / Self Care | Payer: 59 | Source: Ambulatory Visit | Attending: Radiation Oncology | Admitting: Radiation Oncology

## 2021-10-27 DIAGNOSIS — Z510 Encounter for antineoplastic radiation therapy: Secondary | ICD-10-CM | POA: Diagnosis not present

## 2021-10-27 LAB — RAD ONC ARIA SESSION SUMMARY
Course Elapsed Days: 20
Plan Fractions Treated to Date: 14
Plan Prescribed Dose Per Fraction: 2.66 Gy
Plan Total Fractions Prescribed: 16
Plan Total Prescribed Dose: 42.56 Gy
Reference Point Dosage Given to Date: 37.24 Gy
Reference Point Session Dosage Given: 2.66 Gy
Session Number: 14

## 2021-10-28 ENCOUNTER — Other Ambulatory Visit: Payer: Self-pay

## 2021-10-28 ENCOUNTER — Inpatient Hospital Stay: Payer: 59 | Attending: Oncology

## 2021-10-28 ENCOUNTER — Ambulatory Visit
Admission: RE | Admit: 2021-10-28 | Discharge: 2021-10-28 | Disposition: A | Discharge status: Home / Self Care | Payer: 59 | Source: Ambulatory Visit | Attending: Radiation Oncology | Admitting: Radiation Oncology

## 2021-10-28 DIAGNOSIS — Z510 Encounter for antineoplastic radiation therapy: Secondary | ICD-10-CM | POA: Insufficient documentation

## 2021-10-28 DIAGNOSIS — Z170 Estrogen receptor positive status [ER+]: Secondary | ICD-10-CM | POA: Insufficient documentation

## 2021-10-28 DIAGNOSIS — C50412 Malignant neoplasm of upper-outer quadrant of left female breast: Secondary | ICD-10-CM | POA: Insufficient documentation

## 2021-10-28 DIAGNOSIS — I2510 Atherosclerotic heart disease of native coronary artery without angina pectoris: Secondary | ICD-10-CM | POA: Insufficient documentation

## 2021-10-28 DIAGNOSIS — Z79811 Long term (current) use of aromatase inhibitors: Secondary | ICD-10-CM | POA: Insufficient documentation

## 2021-10-28 DIAGNOSIS — J449 Chronic obstructive pulmonary disease, unspecified: Secondary | ICD-10-CM | POA: Insufficient documentation

## 2021-10-28 DIAGNOSIS — F17210 Nicotine dependence, cigarettes, uncomplicated: Secondary | ICD-10-CM | POA: Insufficient documentation

## 2021-10-28 DIAGNOSIS — C50812 Malignant neoplasm of overlapping sites of left female breast: Secondary | ICD-10-CM | POA: Insufficient documentation

## 2021-10-28 LAB — RAD ONC ARIA SESSION SUMMARY
Course Elapsed Days: 21
Plan Fractions Treated to Date: 15
Plan Prescribed Dose Per Fraction: 2.66 Gy
Plan Total Fractions Prescribed: 16
Plan Total Prescribed Dose: 42.56 Gy
Reference Point Dosage Given to Date: 39.9 Gy
Reference Point Session Dosage Given: 2.66 Gy
Session Number: 15

## 2021-10-28 LAB — CBC
HCT: 39.3 % (ref 36.0–46.0)
Hemoglobin: 14 g/dL (ref 12.0–15.0)
MCH: 33 pg (ref 26.0–34.0)
MCHC: 35.6 g/dL (ref 30.0–36.0)
MCV: 92.7 fL (ref 80.0–100.0)
Platelets: 131 10*3/uL — ABNORMAL LOW (ref 150–400)
RBC: 4.24 MIL/uL (ref 3.87–5.11)
RDW: 14.6 % (ref 11.5–15.5)
WBC: 6.1 10*3/uL (ref 4.0–10.5)
nRBC: 0 % (ref 0.0–0.2)

## 2021-10-29 ENCOUNTER — Other Ambulatory Visit: Payer: Self-pay | Admitting: Internal Medicine

## 2021-10-29 ENCOUNTER — Other Ambulatory Visit: Payer: Self-pay

## 2021-10-29 ENCOUNTER — Ambulatory Visit
Admission: RE | Admit: 2021-10-29 | Discharge: 2021-10-29 | Disposition: A | Discharge status: Home / Self Care | Payer: 59 | Source: Ambulatory Visit | Attending: Radiation Oncology | Admitting: Radiation Oncology

## 2021-10-29 DIAGNOSIS — Z510 Encounter for antineoplastic radiation therapy: Secondary | ICD-10-CM | POA: Diagnosis not present

## 2021-10-29 LAB — RAD ONC ARIA SESSION SUMMARY
Course Elapsed Days: 22
Plan Fractions Treated to Date: 16
Plan Prescribed Dose Per Fraction: 2.66 Gy
Plan Total Fractions Prescribed: 16
Plan Total Prescribed Dose: 42.56 Gy
Reference Point Dosage Given to Date: 42.56 Gy
Reference Point Session Dosage Given: 2.66 Gy
Session Number: 16

## 2021-10-30 ENCOUNTER — Ambulatory Visit
Admission: RE | Admit: 2021-10-30 | Discharge: 2021-10-30 | Disposition: A | Discharge status: Home / Self Care | Payer: 59 | Source: Ambulatory Visit | Attending: Radiation Oncology | Admitting: Radiation Oncology

## 2021-10-30 ENCOUNTER — Other Ambulatory Visit: Payer: Self-pay

## 2021-10-30 DIAGNOSIS — Z510 Encounter for antineoplastic radiation therapy: Secondary | ICD-10-CM | POA: Diagnosis not present

## 2021-10-30 LAB — RAD ONC ARIA SESSION SUMMARY
Course Elapsed Days: 23
Plan Fractions Treated to Date: 1
Plan Prescribed Dose Per Fraction: 2 Gy
Plan Total Fractions Prescribed: 5
Plan Total Prescribed Dose: 10 Gy
Reference Point Dosage Given to Date: 2 Gy
Reference Point Session Dosage Given: 2 Gy
Session Number: 17

## 2021-11-02 ENCOUNTER — Other Ambulatory Visit: Payer: Self-pay

## 2021-11-02 ENCOUNTER — Ambulatory Visit
Admission: RE | Admit: 2021-11-02 | Discharge: 2021-11-02 | Disposition: A | Discharge status: Home / Self Care | Payer: 59 | Source: Ambulatory Visit | Attending: Radiation Oncology | Admitting: Radiation Oncology

## 2021-11-02 DIAGNOSIS — Z510 Encounter for antineoplastic radiation therapy: Secondary | ICD-10-CM | POA: Diagnosis not present

## 2021-11-02 LAB — RAD ONC ARIA SESSION SUMMARY
Course Elapsed Days: 26
Plan Fractions Treated to Date: 2
Plan Prescribed Dose Per Fraction: 2 Gy
Plan Total Fractions Prescribed: 5
Plan Total Prescribed Dose: 10 Gy
Reference Point Dosage Given to Date: 4 Gy
Reference Point Session Dosage Given: 2 Gy
Session Number: 18

## 2021-11-03 ENCOUNTER — Ambulatory Visit
Admission: RE | Admit: 2021-11-03 | Discharge: 2021-11-03 | Disposition: A | Discharge status: Home / Self Care | Payer: 59 | Source: Ambulatory Visit | Attending: Radiation Oncology | Admitting: Radiation Oncology

## 2021-11-03 ENCOUNTER — Other Ambulatory Visit: Payer: Self-pay

## 2021-11-03 DIAGNOSIS — Z510 Encounter for antineoplastic radiation therapy: Secondary | ICD-10-CM | POA: Diagnosis not present

## 2021-11-03 LAB — RAD ONC ARIA SESSION SUMMARY
Course Elapsed Days: 27
Plan Fractions Treated to Date: 3
Plan Prescribed Dose Per Fraction: 2 Gy
Plan Total Fractions Prescribed: 5
Plan Total Prescribed Dose: 10 Gy
Reference Point Dosage Given to Date: 6 Gy
Reference Point Session Dosage Given: 2 Gy
Session Number: 19

## 2021-11-04 ENCOUNTER — Ambulatory Visit
Admission: RE | Admit: 2021-11-04 | Discharge: 2021-11-04 | Disposition: A | Discharge status: Home / Self Care | Payer: 59 | Source: Ambulatory Visit | Attending: Radiation Oncology | Admitting: Radiation Oncology

## 2021-11-04 ENCOUNTER — Other Ambulatory Visit: Payer: Self-pay

## 2021-11-04 DIAGNOSIS — Z510 Encounter for antineoplastic radiation therapy: Secondary | ICD-10-CM | POA: Diagnosis not present

## 2021-11-04 LAB — RAD ONC ARIA SESSION SUMMARY
Course Elapsed Days: 28
Plan Fractions Treated to Date: 4
Plan Prescribed Dose Per Fraction: 2 Gy
Plan Total Fractions Prescribed: 5
Plan Total Prescribed Dose: 10 Gy
Reference Point Dosage Given to Date: 8 Gy
Reference Point Session Dosage Given: 2 Gy
Session Number: 20

## 2021-11-05 ENCOUNTER — Other Ambulatory Visit: Payer: Self-pay

## 2021-11-05 ENCOUNTER — Ambulatory Visit
Admission: RE | Admit: 2021-11-05 | Discharge: 2021-11-05 | Disposition: A | Discharge status: Home / Self Care | Payer: 59 | Source: Ambulatory Visit | Attending: Radiation Oncology | Admitting: Radiation Oncology

## 2021-11-05 DIAGNOSIS — Z510 Encounter for antineoplastic radiation therapy: Secondary | ICD-10-CM | POA: Diagnosis not present

## 2021-11-05 LAB — RAD ONC ARIA SESSION SUMMARY
Course Elapsed Days: 29
Plan Fractions Treated to Date: 5
Plan Prescribed Dose Per Fraction: 2 Gy
Plan Total Fractions Prescribed: 5
Plan Total Prescribed Dose: 10 Gy
Reference Point Dosage Given to Date: 10 Gy
Reference Point Session Dosage Given: 2 Gy
Session Number: 21

## 2021-11-06 ENCOUNTER — Encounter: Payer: Self-pay | Admitting: *Deleted

## 2021-11-09 ENCOUNTER — Other Ambulatory Visit: Payer: Self-pay | Admitting: Gastroenterology

## 2021-11-09 DIAGNOSIS — K7030 Alcoholic cirrhosis of liver without ascites: Secondary | ICD-10-CM

## 2021-11-16 ENCOUNTER — Ambulatory Visit
Admission: RE | Admit: 2021-11-16 | Discharge: 2021-11-16 | Disposition: A | Discharge status: Home / Self Care | Payer: 59 | Source: Ambulatory Visit | Attending: Gastroenterology | Admitting: Gastroenterology

## 2021-11-16 DIAGNOSIS — K7030 Alcoholic cirrhosis of liver without ascites: Secondary | ICD-10-CM | POA: Diagnosis present

## 2021-11-18 ENCOUNTER — Encounter: Payer: Self-pay | Admitting: Nurse Practitioner

## 2021-11-18 ENCOUNTER — Ambulatory Visit: Payer: 59 | Attending: Internal Medicine | Admitting: Nurse Practitioner

## 2021-11-18 ENCOUNTER — Other Ambulatory Visit
Admission: RE | Admit: 2021-11-18 | Discharge: 2021-11-18 | Disposition: A | Discharge status: Home / Self Care | Payer: 59 | Source: Ambulatory Visit | Attending: Nurse Practitioner | Admitting: Nurse Practitioner

## 2021-11-18 VITALS — BP 100/60 | HR 98 | Ht 66.0 in | Wt 186.2 lb

## 2021-11-18 DIAGNOSIS — I4819 Other persistent atrial fibrillation: Secondary | ICD-10-CM | POA: Diagnosis not present

## 2021-11-18 DIAGNOSIS — I255 Ischemic cardiomyopathy: Secondary | ICD-10-CM | POA: Diagnosis not present

## 2021-11-18 DIAGNOSIS — I480 Paroxysmal atrial fibrillation: Secondary | ICD-10-CM | POA: Insufficient documentation

## 2021-11-18 DIAGNOSIS — I2510 Atherosclerotic heart disease of native coronary artery without angina pectoris: Secondary | ICD-10-CM

## 2021-11-18 DIAGNOSIS — I5022 Chronic systolic (congestive) heart failure: Secondary | ICD-10-CM | POA: Diagnosis not present

## 2021-11-18 DIAGNOSIS — N183 Chronic kidney disease, stage 3 unspecified: Secondary | ICD-10-CM | POA: Diagnosis not present

## 2021-11-18 DIAGNOSIS — I340 Nonrheumatic mitral (valve) insufficiency: Secondary | ICD-10-CM

## 2021-11-18 LAB — DIGOXIN LEVEL: Digoxin Level: 0.3 ng/mL — ABNORMAL LOW (ref 0.8–2.0)

## 2021-11-18 MED ORDER — METOPROLOL SUCCINATE ER 50 MG PO TB24: 50.0000 mg | ORAL_TABLET | Freq: Two times a day (BID) | ORAL | 2 refills | Status: DC
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
## 2021-11-18 NOTE — Patient Instructions (Signed)
Medication Instructions:  Your physician has recommended you make the following change in your medication:   STOP Propanolol  START Metoprolol Succinate 50 mg twice a day. An Rx has been sent to your pharmacy  *If you need a refill on your cardiac medications before your next appointment, please call your pharmacy*   Lab Work: Digoxin level today.  Please have your labs drawn at the [facility]. Stop at the Registration desk to check in  If you have labs (blood work) drawn today and your tests are completely normal, you will receive your results only by: [place] (if you have MyChart) OR A paper copy in the mail If you have any lab test that is abnormal or we need to change your treatment, we will call you to review the results.   Testing/Procedures: None ordered   Follow-Up: At [facility], you and your health needs are our priority.  As part of our continuing mission to provide you with exceptional heart care, we have created designated Provider Care Teams.  These Care Teams include your primary Cardiologist (physician) and Advanced Practice Providers (APPs -  Physician Assistants and Nurse Practitioners) who all work together to provide you with the care you need, when you need it.  We recommend signing up for the patient portal called "MyChart".  Sign up information is provided on this After Visit Summary.  MyChart is used to connect with patients for Virtual Visits (Telemedicine).  Patients are able to view lab/test results, encounter notes, upcoming appointments, etc.  Non-urgent messages can be sent to your provider as well.   To learn more about what you can do with MyChart, go to [email or web address].    Your next appointment:   4 week(s)  The format for your next appointment:   In Person  Provider:   You may see Nelva Bush, MD or one of the following Advanced Practice Providers on your designated Care Team:   Murray Hodgkins,  NP   Other Instructions N/A  Important Information About Sugar

## 2021-12-02 ENCOUNTER — Other Ambulatory Visit: Payer: Self-pay | Admitting: Oncology

## 2021-12-07 ENCOUNTER — Ambulatory Visit
Admission: RE | Admit: 2021-12-07 | Discharge: 2021-12-07 | Disposition: A | Discharge status: Home / Self Care | Payer: 59 | Source: Ambulatory Visit | Attending: Oncology | Admitting: Oncology

## 2021-12-07 ENCOUNTER — Encounter: Payer: Self-pay | Admitting: Radiation Oncology

## 2021-12-07 VITALS — BP 107/68 | HR 109 | Temp 97.3°F | Resp 20 | Ht 66.0 in | Wt 188.6 lb

## 2021-12-07 DIAGNOSIS — C50412 Malignant neoplasm of upper-outer quadrant of left female breast: Secondary | ICD-10-CM | POA: Insufficient documentation

## 2021-12-07 DIAGNOSIS — Z170 Estrogen receptor positive status [ER+]: Secondary | ICD-10-CM | POA: Insufficient documentation

## 2021-12-07 NOTE — Progress Notes (Signed)
Radiation Oncology Follow up Note  Name: Ariana Riggs   Date:   12/07/2021 MRN:  [id number] DOB: Nov 16, 1960    This 61 y.o. female presents to the clinic today for 1 month follow-up status post whole breast radiation to her left breast for stage Ia ER/PR positive invasive mammary carcinoma status post wide local excision.  REFERRING PROVIDER: Latanya Maudlin, NP  HPI: Patient is a 61 year old female now out 1 month having completed whole breast radiation to her left breast for stage Ia ER/PR positive invasive mammary carcinoma.  She still having some significant pain and tenderness in her breast not sure the etiology her skin looks fine..  She has been started on Arimidex as according to patient causing her some personality changes.  She specifically [place] cough or bone pain.  COMPLICATIONS OF TREATMENT: none  FOLLOW UP COMPLIANCE: keeps appointments   PHYSICAL EXAM:  BP 107/68 (BP Location: Left Arm, Patient Position: Sitting, Cuff Size: Normal)   Pulse (!) 109   Temp (!) 97.3 F (36.3 C) (Tympanic)   Resp 20   Ht '5\' 6"'$  (1.676 m) Comment: stated ht  Wt 188 lb 9.6 oz (85.5 kg)   LMP  (LMP Unknown)   BMI 30.44 kg/m  Lungs are clear to A&P cardiac examination essentially unremarkable with regular rate and rhythm. No dominant mass or nodularity is noted in either breast in 2 positions examined. Incision is well-healed. No axillary or supraclavicular adenopathy is appreciated. Cosmetic result is excellent.  Well-developed well-nourished patient in NAD. HEENT reveals PERLA, EOMI, discs not visualized.  Oral cavity is clear. No oral mucosal lesions are identified. Neck is clear without evidence of cervical or supraclavicular adenopathy. Lungs are clear to A&P. Cardiac examination is essentially unremarkable with regular rate and rhythm without murmur rub or thrill. Abdomen is benign with no organomegaly or masses noted. Motor sensory and DTR levels are equal and symmetric in the upper  and lower extremities. Cranial nerves II through XII are grossly intact. Proprioception is intact. No peripheral adenopathy or edema is identified. No motor or sensory levels are noted. Crude visual fields are within normal range.  RADIOLOGY RESULTS: No current films for review  PLAN: Time patient is still recovering with some significant tenderness of her left breast although on exam it is perfectly normal skin is healed fine and the incision is well-healed.  She continues on Arimidex we will discuss her personality changes with medical oncology with next time she is seen.  I have asked to see her back in 6 months for follow-up.  Patient is to call with any concerns.  I would like to take this opportunity to thank you for allowing me to participate in the care of your patient.Noreene Filbert, MD

## 2021-12-16 NOTE — Progress Notes (Signed)
PCP notes requested.

## 2021-12-23 ENCOUNTER — Encounter: Payer: Self-pay | Admitting: Nurse Practitioner

## 2021-12-23 ENCOUNTER — Other Ambulatory Visit
Admission: RE | Admit: 2021-12-23 | Discharge: 2021-12-23 | Disposition: A | Discharge status: Home / Self Care | Payer: 59 | Source: Ambulatory Visit | Attending: Nurse Practitioner | Admitting: Nurse Practitioner

## 2021-12-23 ENCOUNTER — Ambulatory Visit: Payer: 59 | Attending: Nurse Practitioner | Admitting: Nurse Practitioner

## 2021-12-23 VITALS — BP 100/60 | HR 100 | Ht 66.0 in | Wt 188.8 lb

## 2021-12-23 DIAGNOSIS — N183 Chronic kidney disease, stage 3 unspecified: Secondary | ICD-10-CM

## 2021-12-23 DIAGNOSIS — I4819 Other persistent atrial fibrillation: Secondary | ICD-10-CM | POA: Insufficient documentation

## 2021-12-23 DIAGNOSIS — I5022 Chronic systolic (congestive) heart failure: Secondary | ICD-10-CM | POA: Insufficient documentation

## 2021-12-23 DIAGNOSIS — I2510 Atherosclerotic heart disease of native coronary artery without angina pectoris: Secondary | ICD-10-CM

## 2021-12-23 DIAGNOSIS — E785 Hyperlipidemia, unspecified: Secondary | ICD-10-CM

## 2021-12-23 DIAGNOSIS — E0590 Thyrotoxicosis, unspecified without thyrotoxic crisis or storm: Secondary | ICD-10-CM

## 2021-12-23 DIAGNOSIS — I255 Ischemic cardiomyopathy: Secondary | ICD-10-CM | POA: Insufficient documentation

## 2021-12-23 LAB — BASIC METABOLIC PANEL
Anion gap: 9 (ref 5–15)
BUN: 16 mg/dL (ref 8–23)
CO2: 27 mmol/L (ref 22–32)
Calcium: 9.3 mg/dL (ref 8.9–10.3)
Chloride: 104 mmol/L (ref 98–111)
Creatinine, Ser: 0.87 mg/dL (ref 0.44–1.00)
GFR, Estimated: >60 mL/min (ref >60)
Glucose, Bld: 129 mg/dL — ABNORMAL HIGH (ref 70–99)
Potassium: 4.1 mmol/L (ref 3.5–5.1)
Sodium: 140 mmol/L (ref 135–145)

## 2021-12-23 LAB — CBC
HCT: 38.9 % (ref 36.0–46.0)
Hemoglobin: 13.8 g/dL (ref 12.0–15.0)
MCH: 33.7 pg (ref 26.0–34.0)
MCHC: 35.5 g/dL (ref 30.0–36.0)
MCV: 94.9 fL (ref 80.0–100.0)
Platelets: 124 10*3/uL — ABNORMAL LOW (ref 150–400)
RBC: 4.1 MIL/uL (ref 3.87–5.11)
RDW: 14.9 % (ref 11.5–15.5)
WBC: 6.8 10*3/uL (ref 4.0–10.5)
nRBC: 0 % (ref 0.0–0.2)

## 2021-12-23 LAB — TSH: TSH: 1.97 u[IU]/mL (ref 0.350–4.500)

## 2021-12-23 MED ORDER — FUROSEMIDE 40 MG PO TABS: 80.0000 mg | ORAL_TABLET | Freq: Two times a day (BID) | ORAL | 2 refills | Status: DC
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
## 2021-12-23 NOTE — Patient Instructions (Addendum)
Medication Instructions:  Your physician has recommended you make the following change in your medication:   INCREASE Furosemide (Lasix) to 80 mg twice a day.   *If you need a refill on your cardiac medications before your next appointment, please call your pharmacy*   Lab Work: CBC, BMET, TSH to be done today. Go to the following location: [facility] Entrance at [facility] 1st desk on the right to check in (REGISTRATION)  Lab hours: Monday- Friday (7:30 am- 5:30 pm)  If you have labs (blood work) drawn today and your tests are completely normal, you will receive your results only by: [place] (if you have MyChart) OR A paper copy in the mail If you have any lab test that is abnormal or we need to change your treatment, we will call you to review the results.   Testing/Procedures: You are scheduled for a Cardioversion on 12/29/21 with Dr. Saunders Revel Please arrive at the [facility] at 7:00 a.m. on the day of your procedure.  DIET INSTRUCTIONS:  Nothing to eat or drink after midnight except your medications with a small sip of water.        Medications:  YOU MAY TAKE ALL of your medications with a small amount of water.  Must have a responsible person to drive you home.  Bring a current list of your medications and current insurance cards.    If you have any questions after you get home, please call the office at [phone]    Follow-Up: At [facility], you and your health needs are our priority.  As part of our continuing mission to provide you with exceptional heart care, we have created designated Provider Care Teams.  These Care Teams include your primary Cardiologist (physician) and Advanced Practice Providers (APPs -  Physician Assistants and Nurse Practitioners) who all work together to provide you with the care you need, when you need it.   Your next appointment:   2 week(s)  The format for your next appointment:   In Person  Provider:    Nelva Bush, MD or Murray Hodgkins, NP       Important Information About Sugar
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
## 2021-12-29 ENCOUNTER — Ambulatory Visit
Admission: RE | Admit: 2021-12-29 | Discharge: 2021-12-29 | Disposition: A | Discharge status: Home / Self Care | Payer: 59 | Attending: Internal Medicine | Admitting: Internal Medicine

## 2021-12-29 ENCOUNTER — Encounter: Payer: Self-pay | Admitting: Internal Medicine

## 2021-12-29 ENCOUNTER — Other Ambulatory Visit: Payer: Self-pay

## 2021-12-29 ENCOUNTER — Encounter
Admission: RE | Disposition: A | Discharge status: Home / Self Care | Payer: Self-pay | Source: Home / Self Care | Attending: Internal Medicine

## 2021-12-29 ENCOUNTER — Ambulatory Visit: Payer: 59 | Admitting: Anesthesiology

## 2021-12-29 DIAGNOSIS — Z7901 Long term (current) use of anticoagulants: Secondary | ICD-10-CM | POA: Insufficient documentation

## 2021-12-29 DIAGNOSIS — F32A Depression, unspecified: Secondary | ICD-10-CM | POA: Insufficient documentation

## 2021-12-29 DIAGNOSIS — I5022 Chronic systolic (congestive) heart failure: Secondary | ICD-10-CM | POA: Diagnosis not present

## 2021-12-29 DIAGNOSIS — J449 Chronic obstructive pulmonary disease, unspecified: Secondary | ICD-10-CM | POA: Diagnosis not present

## 2021-12-29 DIAGNOSIS — E0590 Thyrotoxicosis, unspecified without thyrotoxic crisis or storm: Secondary | ICD-10-CM | POA: Insufficient documentation

## 2021-12-29 DIAGNOSIS — F17210 Nicotine dependence, cigarettes, uncomplicated: Secondary | ICD-10-CM | POA: Insufficient documentation

## 2021-12-29 DIAGNOSIS — Z79899 Other long term (current) drug therapy: Secondary | ICD-10-CM | POA: Insufficient documentation

## 2021-12-29 DIAGNOSIS — R188 Other ascites: Secondary | ICD-10-CM | POA: Diagnosis not present

## 2021-12-29 DIAGNOSIS — Z923 Personal history of irradiation: Secondary | ICD-10-CM | POA: Diagnosis not present

## 2021-12-29 DIAGNOSIS — I2510 Atherosclerotic heart disease of native coronary artery without angina pectoris: Secondary | ICD-10-CM | POA: Diagnosis not present

## 2021-12-29 DIAGNOSIS — E1122 Type 2 diabetes mellitus with diabetic chronic kidney disease: Secondary | ICD-10-CM | POA: Diagnosis not present

## 2021-12-29 DIAGNOSIS — Z853 Personal history of malignant neoplasm of breast: Secondary | ICD-10-CM | POA: Insufficient documentation

## 2021-12-29 DIAGNOSIS — K219 Gastro-esophageal reflux disease without esophagitis: Secondary | ICD-10-CM | POA: Diagnosis not present

## 2021-12-29 DIAGNOSIS — I255 Ischemic cardiomyopathy: Secondary | ICD-10-CM | POA: Insufficient documentation

## 2021-12-29 DIAGNOSIS — Z9049 Acquired absence of other specified parts of digestive tract: Secondary | ICD-10-CM | POA: Insufficient documentation

## 2021-12-29 DIAGNOSIS — N183 Chronic kidney disease, stage 3 unspecified: Secondary | ICD-10-CM | POA: Diagnosis not present

## 2021-12-29 DIAGNOSIS — I4819 Other persistent atrial fibrillation: Secondary | ICD-10-CM

## 2021-12-29 DIAGNOSIS — J4489 Other specified chronic obstructive pulmonary disease: Secondary | ICD-10-CM | POA: Diagnosis not present

## 2021-12-29 DIAGNOSIS — E785 Hyperlipidemia, unspecified: Secondary | ICD-10-CM | POA: Insufficient documentation

## 2021-12-29 HISTORY — PX: CARDIOVERSION: SHX1299

## 2021-12-29 SURGERY — CARDIOVERSION
Anesthesia: General

## 2021-12-29 MED ORDER — PROPOFOL 10 MG/ML IV BOLUS
INTRAVENOUS | Status: DC | PRN
  Administered 2021-12-29: 20 mg via INTRAVENOUS
  Administered 2021-12-29: 40 mg via INTRAVENOUS

## 2021-12-29 MED ORDER — ACETAMINOPHEN 160 MG/5ML PO SOLN: 325.0000 mg | ORAL | Status: DC | PRN

## 2021-12-29 MED ORDER — SODIUM CHLORIDE 0.9 % IV SOLN: INTRAVENOUS | Status: DC

## 2021-12-29 MED ORDER — ACETAMINOPHEN 325 MG PO TABS: 650.0000 mg | ORAL_TABLET | Freq: Once | ORAL | Status: DC | PRN

## 2021-12-29 NOTE — Interval H&P Note (Signed)
History and Physical Interval Note:  12/29/2021 8:04 AM  Ariana Riggs  has presented today for surgery, with the diagnosis of persistent atrial fibrillation.  The various methods of treatment have been discussed with the patient and family. After consideration of risks, benefits and other options for treatment, the patient has consented to  Procedure(s): CARDIOVERSION (N/A) as a surgical intervention.  The patient's history has been reviewed, patient examined, no change in status, stable for surgery.  I have reviewed the patient's chart and labs.  Questions were answered to the patient's satisfaction.     [name]

## 2021-12-29 NOTE — CV Procedure (Signed)
    Cardioversion Note  Audreena Sachdeva [id number] Apr 30, 1960  Procedure: DC Cardioversion Indications: Persistent atrial fibrillation  Procedure Details Consent: Obtained Time Out: Verified patient identification, verified procedure, site/side was marked, verified correct patient position, special equipment/implants available, Radiology Safety Procedures followed,  medications/allergies/relevent history reviewed, required imaging and test results available.  Performed  The patient has been on adequate anticoagulation.  The patient received IV propofol by anesthesia for sedation.  Synchronous cardioversion was performed at 200 joules x 1.  The cardioversion was successful with restoration of sinus rhythm with PAC's.  Complications: No apparent complications Patient did tolerate procedure well.  Recommendations: Discontinue digoxin.  Continue current doses of metoprolol and apixaban.  Follow-up in office as scheduled on 01/13/2022.  Nelva Bush., MD 12/29/2021, 8:16 AM

## 2021-12-29 NOTE — Transfer of Care (Signed)
Immediate Anesthesia Transfer of Care Note  Patient: Ariana Riggs  Procedure(s) Performed: CARDIOVERSION  Patient Location: PACU and Specials recovery  Anesthesia Type:General  Level of Consciousness: awake, alert , and oriented  Airway & Oxygen Therapy: Patient Spontanous Breathing and Patient connected to nasal cannula oxygen  Post-op Assessment: Report given to RN and Post -op Vital signs reviewed and stable  Post vital signs: Reviewed and stable  Last Vitals:  Vitals Value Taken Time  BP    Temp    Pulse 96 12/29/21 0804  Resp    SpO2 94 % 12/29/21 0804  Vitals shown include unvalidated device data.  Last Pain:  Vitals:   12/29/21 0742  TempSrc: Oral  PainSc: 0-No pain         Complications: No notable events documented.

## 2021-12-29 NOTE — Anesthesia Preprocedure Evaluation (Addendum)
Anesthesia Evaluation  Patient identified by MRN, date of birth, ID band Patient awake    Reviewed: Allergy & Precautions, NPO status , Patient's Chart, lab work & pertinent test results  History of Anesthesia Complications Negative for: history of anesthetic complications  Airway Mallampati: IV   Neck ROM: Full    Dental  (+) Upper Dentures, Lower Dentures   Pulmonary asthma , COPD, Current Smoker and Patient abstained from smoking.   Pulmonary exam normal breath sounds clear to auscultation       Cardiovascular + CAD (s/p stents) and +CHF (cardiomyopathy, EF 35-40%)  + dysrhythmias (a fib on Eliquis) + Valvular Problems/Murmurs (ASD s/p repair)  Rhythm:Irregular Rate:Normal  ECG 12/23/21: atrial fibrillation, 100, incomplete right bundle branch block, anterolateral ST and T abnormalities- no acute changes.  Echo 07/23/21:  1. Left ventricular ejection fraction, by estimation, is 50 to 55%. The left ventricle has low normal function. The left ventricle has no regional wall motion abnormalities. The left ventricular internal cavity size was mildly dilated. Left ventricular diastolic parameters are indeterminate.  2. Right ventricular systolic function is normal. The right ventricular size is normal. There is mildly elevated pulmonary artery systolic pressure. The estimated right ventricular systolic pressure is 58.5 mmHg.  3. Left atrial size was moderately dilated.  4. The mitral valve is normal in structure. Moderate to severe mitral valve regurgitation. No evidence of mitral stenosis.  5. Tricuspid valve regurgitation is moderate.  6. The aortic valve is normal in structure. Aortic valve regurgitation is not visualized. No aortic stenosis is present.  7. The inferior vena cava is normal in size with greater than 50% respiratory variability, suggesting right atrial pressure of 3 mmHg.  8.  Atrial fibrillation noted  Myocardial  perfusion 04/28/18:  1. Normal left ventricular systolic function with an EF of 55% 2. Normal wall motion 3. Mild aortic arch calcification noted on attenuation correction CT 4. Small fixed defect of mild intensity in the inferoapical region consistent with attenuation artifact 5. No evidence of significant stress-induced myocardial ischemia or arrhythmia; no scintigraphic evidence of scar 6. Study determined to be normal and low risk   Neuro/Psych  PSYCHIATRIC DISORDERS Anxiety Depression Bipolar Disorder   Hx alcohol use disorder, last intake 03/2021; hx cocaine use disorder, last use 2010    GI/Hepatic ,GERD  ,,(+) Cirrhosis         Endo/Other  diabetes, Type 2    Renal/GU      Musculoskeletal  (+) Arthritis ,    Abdominal   Peds  Hematology negative hematology ROS (+)   Anesthesia Other Findings Cardiology note 12/23/21:  1.  Persistent atrial fibrillation: Patient admitted earlier this year with sepsis and thyroid storm with associated A-fib and RVR.  Amiodarone was discontinued and she has been rate controlled with digoxin and oral beta-blocker therapy.  Cardioversion has been deferred in the setting management of hyperthyroidism, initially with methimazole with subsequent discontinuation over the summer in the setting of markedly elevated TSH.  Most recent labs in October showed a TSH of 6.120 with a free T41.03 And free T3 of 2.6.  She has been followed by [facility] endocrinology.  She remains in atrial fibrillation today at a rate of 100 bpm.  Blood pressure is soft at 100/60.  We will continue current dose of digoxin (dose limited by mildly elevated digoxin level previously), and Toprol-XL.  She has been compliant with Eliquis and is interested in pursuing direct-current cardioversion.  Risks and benefits discussed in detail  and she is willing to proceed.  We will follow-up CBC, basic metabolic panel, and TSH today.  She understands that she may ultimately require initiation of an  alternate antiarrhythmic and we can look to have her follow-up with Dr. Quentin Ore post cardioversion.   2.  Mixed ischemic and nonischemic cardiomyopathy/HFrEF: EF 35 to 40% by echo in the setting of rapid atrial fibrillation with subsequent improvement to 50-55% by echo in June 2023.  She has noted some increase in dyspnea exertion as well as weight gain and increase in abdominal girth.  She does have mild JVD.  Abdominal ultrasound in early October did not show any significant ascites.  I will have her increase her Lasix to 80 mg twice daily for the time being.  I suspect volume will be easier to manage in sinus rhythm.  She remains on beta-blocker, ARB, spironolactone, and digoxin.   3.  Coronary artery disease: Status post circumflex stenting May 2021 with patent circumflex with catheterization October 2022, and otherwise nonobstructive disease.  She has not been having any chest pain consistent w/ angina (occasional fleeting pain since breast surgery).  She remains on beta-blocker and statin therapy.  No aspirin in setting of Eliquis.   4.  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  She is no longer on methimazole in the setting of elevated TSH over the summer with improvement in TSH to 6.120 in early October with normal free T4 and free T3 at that time.  She has been followed by endocrinology at [facility].  Follow-up TSH as we are obtaining blood work today in preparation for cardioversion.   5.  Alcohol tobacco abuse: No longer using either of these substances.   6.  Breast cancer: Status post radiation.  Followed by oncology.   7.  Hyperlipidemia: This has been followed by primary care.  She remains on statin therapy.  LDL of 32 in December 2022.    8.  Disposition: Follow-up lab work today in preparation for cardioversion next week.  Follow-up in clinic in approximately 2 weeks.   Reproductive/Obstetrics                               Anesthesia Physical Anesthesia Plan  ASA: 3  Anesthesia Plan: General   Post-op Pain Management:    Induction: Intravenous  PONV Risk Score and Plan: 2 and Treatment may vary due to age or medical condition, Propofol infusion and TIVA  Airway Management Planned: Natural Airway  Additional Equipment:   Intra-op Plan:   Post-operative Plan:   Informed Consent: I have reviewed the patients History and Physical, chart, labs and discussed the procedure including the risks, benefits and alternatives for the proposed anesthesia with the patient or authorized representative who has indicated his/her understanding and acceptance.       Plan Discussed with: CRNA  Anesthesia Plan Comments: (LMA/GETA backup discussed.  Patient consented for risks of anesthesia including but not limited to:  - adverse reactions to medications - damage to eyes, teeth, lips or other oral mucosa - nerve damage due to positioning  - sore throat or hoarseness - damage to heart, brain, nerves, lungs, other parts of body or loss of life  Informed patient about role of CRNA in peri- and intra-operative care.  Patient voiced understanding.)         Anesthesia Quick Evaluation

## 2021-12-29 NOTE — Anesthesia Procedure Notes (Signed)
Procedure Name: MAC Date/Time: 12/29/2021 8:05 AM  Performed by: Tollie Eth, CRNAPre-anesthesia Checklist: Patient identified, Emergency Drugs available, Suction available and Patient being monitored Patient Re-evaluated:Patient Re-evaluated prior to induction Oxygen Delivery Method: Nasal cannula Induction Type: IV induction Placement Confirmation: positive ETCO2

## 2021-12-30 ENCOUNTER — Inpatient Hospital Stay: Payer: 59

## 2021-12-30 ENCOUNTER — Inpatient Hospital Stay: Payer: 59 | Attending: Radiation Oncology | Admitting: Oncology

## 2021-12-30 ENCOUNTER — Other Ambulatory Visit: Payer: 59

## 2021-12-30 ENCOUNTER — Ambulatory Visit: Payer: 59 | Admitting: Oncology

## 2021-12-30 VITALS — BP 125/69 | HR 63 | Resp 18 | Wt 186.3 lb

## 2021-12-30 DIAGNOSIS — N183 Chronic kidney disease, stage 3 unspecified: Secondary | ICD-10-CM | POA: Diagnosis not present

## 2021-12-30 DIAGNOSIS — I110 Hypertensive heart disease with heart failure: Secondary | ICD-10-CM | POA: Insufficient documentation

## 2021-12-30 DIAGNOSIS — Z7901 Long term (current) use of anticoagulants: Secondary | ICD-10-CM | POA: Diagnosis not present

## 2021-12-30 DIAGNOSIS — E1122 Type 2 diabetes mellitus with diabetic chronic kidney disease: Secondary | ICD-10-CM | POA: Insufficient documentation

## 2021-12-30 DIAGNOSIS — Z170 Estrogen receptor positive status [ER+]: Secondary | ICD-10-CM

## 2021-12-30 DIAGNOSIS — Z5181 Encounter for therapeutic drug level monitoring: Secondary | ICD-10-CM | POA: Diagnosis not present

## 2021-12-30 DIAGNOSIS — Z803 Family history of malignant neoplasm of breast: Secondary | ICD-10-CM | POA: Diagnosis not present

## 2021-12-30 DIAGNOSIS — Z923 Personal history of irradiation: Secondary | ICD-10-CM | POA: Diagnosis not present

## 2021-12-30 DIAGNOSIS — K7460 Unspecified cirrhosis of liver: Secondary | ICD-10-CM | POA: Diagnosis not present

## 2021-12-30 DIAGNOSIS — C50412 Malignant neoplasm of upper-outer quadrant of left female breast: Secondary | ICD-10-CM | POA: Insufficient documentation

## 2021-12-30 DIAGNOSIS — I5022 Chronic systolic (congestive) heart failure: Secondary | ICD-10-CM | POA: Insufficient documentation

## 2021-12-30 DIAGNOSIS — Z79811 Long term (current) use of aromatase inhibitors: Secondary | ICD-10-CM | POA: Insufficient documentation

## 2021-12-30 DIAGNOSIS — Z800 Family history of malignant neoplasm of digestive organs: Secondary | ICD-10-CM | POA: Insufficient documentation

## 2021-12-30 DIAGNOSIS — I4819 Other persistent atrial fibrillation: Secondary | ICD-10-CM | POA: Diagnosis not present

## 2021-12-30 DIAGNOSIS — F17210 Nicotine dependence, cigarettes, uncomplicated: Secondary | ICD-10-CM | POA: Diagnosis not present

## 2021-12-30 DIAGNOSIS — Z8049 Family history of malignant neoplasm of other genital organs: Secondary | ICD-10-CM | POA: Insufficient documentation

## 2021-12-30 DIAGNOSIS — Z79899 Other long term (current) drug therapy: Secondary | ICD-10-CM | POA: Insufficient documentation

## 2021-12-30 LAB — COMPREHENSIVE METABOLIC PANEL
ALT: 15 U/L (ref 0–44)
AST: 23 U/L (ref 15–41)
Albumin: 4.7 g/dL (ref 3.5–5.0)
Alkaline Phosphatase: 126 U/L (ref 38–126)
Anion gap: 10 (ref 5–15)
BUN: 22 mg/dL (ref 8–23)
CO2: 27 mmol/L (ref 22–32)
Calcium: 9.3 mg/dL (ref 8.9–10.3)
Chloride: 99 mmol/L (ref 98–111)
Creatinine, Ser: 0.89 mg/dL (ref 0.44–1.00)
GFR, Estimated: >60 mL/min (ref >60)
Glucose, Bld: 162 mg/dL — ABNORMAL HIGH (ref 70–99)
Potassium: 3.3 mmol/L — ABNORMAL LOW (ref 3.5–5.1)
Sodium: 136 mmol/L (ref 135–145)
Total Bilirubin: 1.7 mg/dL — ABNORMAL HIGH (ref 0.3–1.2)
Total Protein: 7.6 g/dL (ref 6.5–8.1)

## 2021-12-30 LAB — CBC WITH DIFFERENTIAL/PLATELET
Abs Immature Granulocytes: 0.02 10*3/uL (ref 0.00–0.07)
Basophils Absolute: 0 10*3/uL (ref 0.0–0.1)
Basophils Relative: 0 %
Eosinophils Absolute: 0 10*3/uL (ref 0.0–0.5)
Eosinophils Relative: 1 %
HCT: 38 % (ref 36.0–46.0)
Hemoglobin: 13.4 g/dL (ref 12.0–15.0)
Immature Granulocytes: 0 %
Lymphocytes Relative: 21 %
Lymphs Abs: 1.3 10*3/uL (ref 0.7–4.0)
MCH: 33.7 pg (ref 26.0–34.0)
MCHC: 35.3 g/dL (ref 30.0–36.0)
MCV: 95.5 fL (ref 80.0–100.0)
Monocytes Absolute: 0.6 10*3/uL (ref 0.1–1.0)
Monocytes Relative: 10 %
Neutro Abs: 4.3 10*3/uL (ref 1.7–7.7)
Neutrophils Relative %: 68 %
Platelets: 112 10*3/uL — ABNORMAL LOW (ref 150–400)
RBC: 3.98 MIL/uL (ref 3.87–5.11)
RDW: 14.6 % (ref 11.5–15.5)
WBC: 6.3 10*3/uL (ref 4.0–10.5)
nRBC: 0 % (ref 0.0–0.2)
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
## 2021-12-30 NOTE — Progress Notes (Signed)
Pt will like to discuss ANASTROZOLE and her mood swings states ever since she started medication she has noticed her mood swings have been "off". Pt states she has been very snappy and not nice. Will like to discuss if mood swings will continue or if it will improve.

## 2021-12-30 NOTE — Anesthesia Postprocedure Evaluation (Signed)
Anesthesia Post Note  Patient: Ariana Riggs  Procedure(s) Performed: CARDIOVERSION  Patient location during evaluation: PACU Anesthesia Type: General Level of consciousness: awake and alert, oriented and patient cooperative Pain management: pain level controlled Vital Signs Assessment: post-procedure vital signs reviewed and stable Respiratory status: spontaneous breathing, nonlabored ventilation and respiratory function stable Cardiovascular status: blood pressure returned to baseline and stable Postop Assessment: adequate PO intake Anesthetic complications: no   No notable events documented.   Last Vitals:  Vitals:   12/29/21 0830 12/29/21 0846  BP: 105/71 113/72  Pulse: 99 64  Resp: (!) 23 18  Temp:    SpO2: 95% 92%    Last Pain:  Vitals:   12/29/21 0846  TempSrc:   PainSc: 0-No pain                 Darrin Nipper

## 2022-01-13 ENCOUNTER — Ambulatory Visit: Payer: 59 | Admitting: Nurse Practitioner
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
## 2022-01-14 ENCOUNTER — Ambulatory Visit: Payer: 59 | Attending: Nurse Practitioner | Admitting: Internal Medicine

## 2022-01-14 ENCOUNTER — Encounter: Payer: Self-pay | Admitting: Internal Medicine

## 2022-01-14 VITALS — BP 120/66 | HR 54 | Ht 66.0 in | Wt 182.0 lb

## 2022-01-14 DIAGNOSIS — I2510 Atherosclerotic heart disease of native coronary artery without angina pectoris: Secondary | ICD-10-CM | POA: Diagnosis not present

## 2022-01-14 DIAGNOSIS — R188 Other ascites: Secondary | ICD-10-CM

## 2022-01-14 DIAGNOSIS — E0590 Thyrotoxicosis, unspecified without thyrotoxic crisis or storm: Secondary | ICD-10-CM

## 2022-01-14 DIAGNOSIS — I5022 Chronic systolic (congestive) heart failure: Secondary | ICD-10-CM

## 2022-01-14 DIAGNOSIS — Z79899 Other long term (current) drug therapy: Secondary | ICD-10-CM

## 2022-01-14 DIAGNOSIS — I4819 Other persistent atrial fibrillation: Secondary | ICD-10-CM | POA: Diagnosis not present

## 2022-01-14 DIAGNOSIS — K7460 Unspecified cirrhosis of liver: Secondary | ICD-10-CM | POA: Diagnosis not present

## 2022-01-14 MED ORDER — POTASSIUM CHLORIDE CRYS ER 20 MEQ PO TBCR: EXTENDED_RELEASE_TABLET | ORAL | 3 refills | Status: DC

## 2022-01-14 NOTE — Patient Instructions (Addendum)
Medication Instructions:  INCREASE Potassium to 40 meq (2 tablet) every morning, and 20 meq (1 tablet) at night  *If you need a refill on your cardiac medications before your next appointment, please call your pharmacy*   Lab Work: Your provider would like for you to return in 1 week to have the following labs drawn: (BMP).   [facility] entrance and check in at the front desk.  You do not need an appointment.  They are open from 7am-6 pm.  You do not need to be fasting.  If you have labs (blood work) drawn today and your tests are completely normal, you will receive your results only by: [place] (if you have MyChart) OR A paper copy in the mail If you have any lab test that is abnormal or we need to change your treatment, we will call you to review the results.   Testing/Procedures: None ordered today   Follow-Up: At [facility], you and your health needs are our priority.  As part of our continuing mission to provide you with exceptional heart care, we have created designated Provider Care Teams.  These Care Teams include your primary Cardiologist (physician) and Advanced Practice Providers (APPs -  Physician Assistants and Nurse Practitioners) who all work together to provide you with the care you need, when you need it.  We recommend signing up for the patient portal called "MyChart".  Sign up information is provided on this After Visit Summary.  MyChart is used to connect with patients for Virtual Visits (Telemedicine).  Patients are able to view lab/test results, encounter notes, upcoming appointments, etc.  Non-urgent messages can be sent to your provider as well.   To learn more about what you can do with MyChart, go to [email or web address].    Your next appointment:   3 month(s)  The format for your next appointment:   In Person  Provider:   You may see Nelva Bush, MD or one of the following Advanced Practice Providers on  your designated Care Team:   Murray Hodgkins, NP Christell Faith, PA-C Cadence Kathlen Mody, PA-C Gerrie Nordmann, NP

## 2022-01-21 ENCOUNTER — Other Ambulatory Visit: Payer: Self-pay | Admitting: Internal Medicine

## 2022-01-21 ENCOUNTER — Other Ambulatory Visit
Admission: RE | Admit: 2022-01-21 | Discharge: 2022-01-21 | Disposition: A | Discharge status: Home / Self Care | Payer: 59 | Attending: Internal Medicine | Admitting: Internal Medicine

## 2022-01-21 DIAGNOSIS — I4819 Other persistent atrial fibrillation: Secondary | ICD-10-CM | POA: Diagnosis present

## 2022-01-21 DIAGNOSIS — Z79899 Other long term (current) drug therapy: Secondary | ICD-10-CM | POA: Diagnosis present

## 2022-01-21 DIAGNOSIS — I5022 Chronic systolic (congestive) heart failure: Secondary | ICD-10-CM | POA: Insufficient documentation

## 2022-01-21 LAB — BASIC METABOLIC PANEL
Anion gap: 7 (ref 5–15)
BUN: 20 mg/dL (ref 8–23)
CO2: 28 mmol/L (ref 22–32)
Calcium: 9.4 mg/dL (ref 8.9–10.3)
Chloride: 101 mmol/L (ref 98–111)
Creatinine, Ser: 0.87 mg/dL (ref 0.44–1.00)
GFR, Estimated: >60 mL/min (ref >60)
Glucose, Bld: 101 mg/dL — ABNORMAL HIGH (ref 70–99)
Potassium: 4 mmol/L (ref 3.5–5.1)
Sodium: 136 mmol/L (ref 135–145)

## 2022-01-21 NOTE — Telephone Encounter (Signed)
Refill request

## 2022-01-21 NOTE — Telephone Encounter (Signed)
Prescription refill request for Ariana Riggs received. Indication: AF Last office visit: 01/14/22  C End MD Scr: 0.89 on 12/30/21 Age: 61 Weight: 82.6kg  Based on above findings Ariana Riggs '5mg'$  twice daily is the appropriate dose.  Refill approved.

## 2022-02-11 ENCOUNTER — Other Ambulatory Visit: Payer: Self-pay | Admitting: Nurse Practitioner

## 2022-02-13 ENCOUNTER — Other Ambulatory Visit: Payer: Self-pay | Admitting: Nurse Practitioner

## 2022-02-22 ENCOUNTER — Other Ambulatory Visit (HOSPITAL_COMMUNITY): Payer: Self-pay

## 2022-03-27 ENCOUNTER — Encounter: Payer: Self-pay | Admitting: Internal Medicine

## 2022-03-27 DIAGNOSIS — I4819 Other persistent atrial fibrillation: Secondary | ICD-10-CM

## 2022-03-29 ENCOUNTER — Ambulatory Visit: Payer: 59 | Attending: Cardiovascular Disease | Admitting: Cardiovascular Disease

## 2022-03-29 ENCOUNTER — Encounter: Payer: Self-pay | Admitting: Cardiovascular Disease

## 2022-03-29 ENCOUNTER — Other Ambulatory Visit
Admission: RE | Admit: 2022-03-29 | Discharge: 2022-03-29 | Disposition: A | Discharge status: Home / Self Care | Payer: 59 | Source: Ambulatory Visit | Attending: Cardiovascular Disease | Admitting: Cardiovascular Disease

## 2022-03-29 VITALS — BP 110/60 | HR 108 | Ht 66.0 in | Wt 189.2 lb

## 2022-03-29 DIAGNOSIS — Z01812 Encounter for preprocedural laboratory examination: Secondary | ICD-10-CM | POA: Diagnosis not present

## 2022-03-29 DIAGNOSIS — I2510 Atherosclerotic heart disease of native coronary artery without angina pectoris: Secondary | ICD-10-CM

## 2022-03-29 LAB — BASIC METABOLIC PANEL
Anion gap: 12 (ref 5–15)
BUN: 22 mg/dL (ref 8–23)
CO2: 26 mmol/L (ref 22–32)
Calcium: 9.3 mg/dL (ref 8.9–10.3)
Chloride: 100 mmol/L (ref 98–111)
Creatinine, Ser: 1.04 mg/dL — ABNORMAL HIGH (ref 0.44–1.00)
GFR, Estimated: >60 mL/min (ref >60)
Glucose, Bld: 120 mg/dL — ABNORMAL HIGH (ref 70–99)
Potassium: 3.5 mmol/L (ref 3.5–5.1)
Sodium: 138 mmol/L (ref 135–145)

## 2022-03-29 LAB — CBC
HCT: 36.6 % (ref 36.0–46.0)
Hemoglobin: 13.2 g/dL (ref 12.0–15.0)
MCH: 35 pg — ABNORMAL HIGH (ref 26.0–34.0)
MCHC: 36.1 g/dL — ABNORMAL HIGH (ref 30.0–36.0)
MCV: 97.1 fL (ref 80.0–100.0)
Platelets: 140 10*3/uL — ABNORMAL LOW (ref 150–400)
RBC: 3.77 MIL/uL — ABNORMAL LOW (ref 3.87–5.11)
RDW: 14.6 % (ref 11.5–15.5)
WBC: 6.6 10*3/uL (ref 4.0–10.5)
nRBC: 0.3 % — ABNORMAL HIGH (ref 0.0–0.2)

## 2022-03-29 MED ORDER — APIXABAN 5 MG PO TABS: 5.0000 mg | ORAL_TABLET | Freq: Two times a day (BID) | ORAL | 3 refills | Status: DC

## 2022-03-29 NOTE — Telephone Encounter (Signed)
Prescription refill request for Eliquis received. Indication: Afib  Last office visit: 01/14/22 (End)  Scr: 0.87 (01/21/22)  Age: 61 Weight: 82.6kg  Appropriate dose. Refill sent.
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
## 2022-03-29 NOTE — Patient Instructions (Addendum)
We will set up a cardioversion for atrial fibrillation this Thursday  Referral to Dr. Quentin Ore, [place], for discussion of medication vs ablation   Medication Instructions:  Please increase the metoprolol up to 1 1/2 pills twice a day (75 mg twice a day)   If you need a refill on your cardiac medications before your next appointment, please call your pharmacy.   Lab work: Your physician recommends that you get lab work: [facility] at [facility] 1st desk on the right to check in (REGISTRATION)  Lab hours: Monday- Friday (7:30 am- 5:30 pm)   Testing/Procedures: Your physician has recommended that you have a Cardioversion (DCCV). Electrical Cardioversion uses a jolt of electricity to your heart either through paddles or wired patches attached to your chest. This is a controlled, usually prescheduled, procedure. Defibrillation is done under light anesthesia in the hospital, and you usually go home the day of the procedure. This is done to get your heart back into a normal rhythm. You are not awake for the procedure. Please see the instruction sheet given to you today.   You are scheduled for a Cardioversion on Thursday 04/01/22 with Dr. Rockey Situ Please arrive at the [facility] at 6:30 a.m. on the day of your procedure.  DIET INSTRUCTIONS:  Nothing to eat or drink after midnight except your medications with a sip of water.         Labs: today  Medications:  YOU MAY TAKE ALL except furosemide (LASIX) 40 MG tablet of your remaining medications with a small amount of water.  Must have a responsible person to drive you home.  Bring a current list of your medications and current insurance cards.    If you have any questions after you get home, please call the office at [phone]   Follow-Up: At [facility], you and your health needs are our priority.  As part of our continuing mission to provide you with exceptional heart care, we have created designated Provider  Care Teams.  These Care Teams include your primary Cardiologist (physician) and Advanced Practice Providers (APPs -  Physician Assistants and Nurse Practitioners) who all work together to provide you with the care you need, when you need it.  You will need a follow up appointment with Dr. Saunders Revel 2 months  Providers on your designated Care Team:   Murray Hodgkins, NP Christell Faith, PA-C Cadence Kathlen Mody, [place]  COVID-19 Vaccine Information can be found at: [email or web address] For questions related to vaccine distribution or appointments, please email [email or web address] or call [phone].
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
## 2022-03-30 ENCOUNTER — Other Ambulatory Visit (INDEPENDENT_AMBULATORY_CARE_PROVIDER_SITE_OTHER): Payer: Self-pay | Admitting: Nurse Practitioner

## 2022-03-30 DIAGNOSIS — I739 Peripheral vascular disease, unspecified: Secondary | ICD-10-CM

## 2022-03-31 ENCOUNTER — Ambulatory Visit (INDEPENDENT_AMBULATORY_CARE_PROVIDER_SITE_OTHER): Payer: 59 | Admitting: Nurse Practitioner

## 2022-03-31 ENCOUNTER — Other Ambulatory Visit: Payer: Self-pay | Admitting: Cardiovascular Disease

## 2022-03-31 ENCOUNTER — Ambulatory Visit (INDEPENDENT_AMBULATORY_CARE_PROVIDER_SITE_OTHER): Payer: 59

## 2022-03-31 ENCOUNTER — Encounter (INDEPENDENT_AMBULATORY_CARE_PROVIDER_SITE_OTHER): Payer: Self-pay | Admitting: Nurse Practitioner

## 2022-03-31 VITALS — BP 107/68 | HR 123 | Ht 66.0 in | Wt 191.0 lb

## 2022-03-31 DIAGNOSIS — I739 Peripheral vascular disease, unspecified: Secondary | ICD-10-CM | POA: Diagnosis not present

## 2022-03-31 DIAGNOSIS — E785 Hyperlipidemia, unspecified: Secondary | ICD-10-CM

## 2022-03-31 DIAGNOSIS — M79605 Pain in left leg: Secondary | ICD-10-CM

## 2022-03-31 DIAGNOSIS — I4891 Unspecified atrial fibrillation: Secondary | ICD-10-CM

## 2022-03-31 DIAGNOSIS — M79604 Pain in right leg: Secondary | ICD-10-CM | POA: Diagnosis not present

## 2022-03-31 MED ORDER — SODIUM CHLORIDE 0.9 % IV SOLN: INTRAVENOUS | Status: DC

## 2022-04-01 ENCOUNTER — Ambulatory Visit: Payer: 59 | Admitting: Anesthesiology

## 2022-04-01 ENCOUNTER — Encounter
Admission: RE | Disposition: A | Discharge status: Home / Self Care | Payer: Self-pay | Source: Home / Self Care | Attending: Cardiovascular Disease

## 2022-04-01 ENCOUNTER — Ambulatory Visit
Admission: RE | Admit: 2022-04-01 | Discharge: 2022-04-01 | Disposition: A | Discharge status: Home / Self Care | Payer: 59 | Attending: Cardiovascular Disease | Admitting: Cardiovascular Disease

## 2022-04-01 ENCOUNTER — Encounter: Payer: Self-pay | Admitting: Cardiovascular Disease

## 2022-04-01 ENCOUNTER — Other Ambulatory Visit: Payer: Self-pay | Admitting: *Deleted

## 2022-04-01 DIAGNOSIS — I5020 Unspecified systolic (congestive) heart failure: Secondary | ICD-10-CM | POA: Diagnosis not present

## 2022-04-01 DIAGNOSIS — K7460 Unspecified cirrhosis of liver: Secondary | ICD-10-CM | POA: Insufficient documentation

## 2022-04-01 DIAGNOSIS — I4819 Other persistent atrial fibrillation: Secondary | ICD-10-CM | POA: Diagnosis present

## 2022-04-01 DIAGNOSIS — Z7901 Long term (current) use of anticoagulants: Secondary | ICD-10-CM | POA: Diagnosis not present

## 2022-04-01 DIAGNOSIS — I4891 Unspecified atrial fibrillation: Secondary | ICD-10-CM

## 2022-04-01 DIAGNOSIS — Z8249 Family history of ischemic heart disease and other diseases of the circulatory system: Secondary | ICD-10-CM | POA: Insufficient documentation

## 2022-04-01 DIAGNOSIS — Z79899 Other long term (current) drug therapy: Secondary | ICD-10-CM | POA: Diagnosis not present

## 2022-04-01 DIAGNOSIS — I340 Nonrheumatic mitral (valve) insufficiency: Secondary | ICD-10-CM | POA: Insufficient documentation

## 2022-04-01 DIAGNOSIS — F17210 Nicotine dependence, cigarettes, uncomplicated: Secondary | ICD-10-CM | POA: Insufficient documentation

## 2022-04-01 DIAGNOSIS — E785 Hyperlipidemia, unspecified: Secondary | ICD-10-CM | POA: Insufficient documentation

## 2022-04-01 DIAGNOSIS — I255 Ischemic cardiomyopathy: Secondary | ICD-10-CM | POA: Insufficient documentation

## 2022-04-01 DIAGNOSIS — I2510 Atherosclerotic heart disease of native coronary artery without angina pectoris: Secondary | ICD-10-CM | POA: Insufficient documentation

## 2022-04-01 DIAGNOSIS — J449 Chronic obstructive pulmonary disease, unspecified: Secondary | ICD-10-CM | POA: Insufficient documentation

## 2022-04-01 DIAGNOSIS — R0602 Shortness of breath: Secondary | ICD-10-CM

## 2022-04-01 DIAGNOSIS — I480 Paroxysmal atrial fibrillation: Secondary | ICD-10-CM

## 2022-04-01 HISTORY — PX: CARDIOVERSION: SHX1299

## 2022-04-01 LAB — GLUCOSE, CAPILLARY: Glucose-Capillary: 132 mg/dL — ABNORMAL HIGH (ref 70–99)

## 2022-04-01 SURGERY — CARDIOVERSION
Anesthesia: General

## 2022-04-01 MED ORDER — ACETAMINOPHEN 325 MG PO TABS: 650.0000 mg | ORAL_TABLET | Freq: Once | ORAL | Status: DC | PRN

## 2022-04-01 MED ORDER — EPHEDRINE SULFATE (PRESSORS) 50 MG/ML IJ SOLN: INTRAMUSCULAR | Status: DC | PRN

## 2022-04-01 MED ORDER — PROPOFOL 10 MG/ML IV BOLUS
INTRAVENOUS | Status: DC | PRN
  Administered 2022-04-01: 10 mg via INTRAVENOUS
  Administered 2022-04-01: 40 mg via INTRAVENOUS
  Administered 2022-04-01 (×2): 20 mg via INTRAVENOUS
  Administered 2022-04-01: 30 mg via INTRAVENOUS

## 2022-04-01 MED ORDER — ACETAMINOPHEN 160 MG/5ML PO SOLN: 325.0000 mg | ORAL | Status: DC | PRN

## 2022-04-01 NOTE — Anesthesia Procedure Notes (Signed)
Date/Time: 04/01/2022 7:33 AM  Performed by: Johnna Acosta, CRNAPre-anesthesia Checklist: Patient identified, Emergency Drugs available, Suction available, Patient being monitored and Timeout performed Patient Re-evaluated:Patient Re-evaluated prior to induction Oxygen Delivery Method: Nasal cannula Preoxygenation: Pre-oxygenation with 100% oxygen Induction Type: IV induction

## 2022-04-01 NOTE — CV Procedure (Addendum)
Cardioversion procedure note For atrial fibrillation.  Procedure Details:  Consent: Risks of procedure as well as the alternatives and risks of each were explained to the (patient/caregiver).  Consent for procedure obtained.  Time Out: Verified patient identification, verified procedure, site/side was marked, verified correct patient position, special equipment/implants available, medications/allergies/relevent history reviewed, required imaging and test results available.  Performed  Patient placed on cardiac monitor, pulse oximetry, supplemental oxygen as necessary.   Sedation given: propofol IV, Dr.  Gomez Cleverly pads placed anterior and posterior chest.   Cardioverted 4 time(s).   Cardioverted at  150J. , 200J, 200J with manaul compression,  Pads  anterolateral 200J, Synchronized biphasic Converted to Paroxysmal atrial fibrillation   Evaluation: Findings: Post procedureWas predominantly atrial fibrillation with short runs of normal sinus At the time of discharge from specials was holding normal sinus  EKG shows: NSR Complications: None Patient did tolerate procedure well.  Time Spent Directly with the Patient: 20 minutes   Esmond Plants, M.D., Ph.D.

## 2022-04-01 NOTE — Transfer of Care (Signed)
Immediate Anesthesia Transfer of Care Note  Patient: Ariana Riggs  Procedure(s) Performed: CARDIOVERSION  Patient Location:  cardiac short stay  Anesthesia Type:General  Level of Consciousness: awake, alert , and oriented  Airway & Oxygen Therapy: Patient Spontanous Breathing and Patient connected to nasal cannula oxygen  Post-op Assessment: Report given to RN and Post -op Vital signs reviewed and stable  Post vital signs: Reviewed and stable  Last Vitals:  Vitals Value Taken Time  BP 101/69 04/01/22 0758  Temp    Pulse 66 04/01/22 0758  Resp 28 04/01/22 0758  SpO2 95 % 04/01/22 0758    Last Pain: There were no vitals filed for this visit.       Complications: No notable events documented.

## 2022-04-01 NOTE — Anesthesia Preprocedure Evaluation (Signed)
Anesthesia Evaluation  Patient identified by MRN, date of birth, ID band Patient awake    Reviewed: Allergy & Precautions, NPO status , Patient's Chart, lab work & pertinent test results  History of Anesthesia Complications Negative for: history of anesthetic complications  Airway Mallampati: IV   Neck ROM: Full    Dental  (+) Upper Dentures, Lower Dentures   Pulmonary asthma , COPD, Current Smoker and Patient abstained from smoking.   Pulmonary exam normal breath sounds clear to auscultation       Cardiovascular + CAD (s/p stents) and +CHF (cardiomyopathy, EF 35-40%)  + dysrhythmias (a fib on Eliquis) + Valvular Problems/Murmurs (ASD s/p repair) MR  Rhythm:Irregular Rate:Normal  Echo 07/23/21:  1. Left ventricular ejection fraction, by estimation, is 50 to 55%. The left ventricle has low normal function. The left ventricle has no regional wall motion abnormalities. The left ventricular internal cavity size was mildly dilated. Left ventricular diastolic parameters are indeterminate.  2. Right ventricular systolic function is normal. The right ventricular size is normal. There is mildly elevated pulmonary artery systolic pressure. The estimated right ventricular systolic pressure is [id number] mmHg.  3. Left atrial size was moderately dilated.  4. The mitral valve is normal in structure. Moderate to severe mitral valve regurgitation. No evidence of mitral stenosis.  5. Tricuspid valve regurgitation is moderate.  6. The aortic valve is normal in structure. Aortic valve regurgitation is not visualized. No aortic stenosis is present.  7. The inferior vena cava is normal in size with greater than 50% respiratory variability, suggesting right atrial pressure of 3 mmHg.  8.  Atrial fibrillation noted  Myocardial perfusion 04/28/18:  1. Normal left ventricular systolic function with an EF of 55% 2. Normal wall motion 3. Mild aortic arch  calcification noted on attenuation correction CT 4. Small fixed defect of mild intensity in the inferoapical region consistent with attenuation artifact 5. No evidence of significant stress-induced myocardial ischemia or arrhythmia; no scintigraphic evidence of scar 6. Study determined to be normal and low risk   Neuro/Psych  PSYCHIATRIC DISORDERS Anxiety Depression Bipolar Disorder   Hx alcohol use disorder, last intake 03/2021; hx cocaine use disorder, last use 2010    GI/Hepatic ,GERD  ,,(+) Cirrhosis         Endo/Other  diabetes, Type 2    Renal/GU      Musculoskeletal  (+) Arthritis ,    Abdominal   Peds  Hematology negative hematology ROS (+)   Anesthesia Other Findings Cardiology note 03/29/22:  Persistent atrial fibrillation Atrial fibrillation dating back to 2020, requiring cardioversion 2023 Not a good candidate for amiodarone given thyroid disease Recommend she increase metoprolol succinate up to 75 twice daily, we will schedule for cardioversion Referral placed to EP for consideration of Tikosyn versus ablation (may need repeat echocardiogram in normal sinus rhythm to evaluate mitral valve regurgitation) -Continue Eliquis 5 twice daily   Coronary artery disease Nonspecific EKG changes, denies anginal symptoms Cholesterol at goal based on lab work 2021 On Eliquis in place of aspirin, continue metoprolol, statin   Cardiomyopathy Possibly tachycardia mediated, had a low ejection fraction 35% up to 50 to 55% June 2023   Mitral valve regurgitation Would consider repeat echocardiogram once normal sinus rhythm restored  Reproductive/Obstetrics                              Anesthesia Physical Anesthesia Plan  ASA: 3  Anesthesia Plan: General  Post-op Pain Management:    Induction: Intravenous  PONV Risk Score and Plan: 2 and Treatment may vary due to age or medical condition, Propofol infusion and TIVA  Airway Management  Planned: Natural Airway  Additional Equipment:   Intra-op Plan:   Post-operative Plan:   Informed Consent: I have reviewed the patients History and Physical, chart, labs and discussed the procedure including the risks, benefits and alternatives for the proposed anesthesia with the patient or authorized representative who has indicated his/her understanding and acceptance.       Plan Discussed with: CRNA  Anesthesia Plan Comments: (LMA/GETA backup discussed.  Patient consented for risks of anesthesia including but not limited to:  - adverse reactions to medications - damage to eyes, teeth, lips or other oral mucosa - nerve damage due to positioning  - sore throat or hoarseness - damage to heart, brain, nerves, lungs, other parts of body or loss of life  Informed patient about role of CRNA in peri- and intra-operative care.  Patient voiced understanding.)         Anesthesia Quick Evaluation

## 2022-04-02 NOTE — H&P (Signed)
H&P Addendum, pre-cardioversion ° °Patient was seen and evaluated prior to -cardioversion procedure °Symptoms, prior testing details again confirmed with the patient °Patient examined, no significant change from prior exam °Lab work reviewed in detail personally by myself °Patient understands risk and benefit of the procedure,  °The risks (stroke, cardiac arrhythmias rarely resulting in the need for a temporary or permanent pacemaker, skin irritation or burns and complications associated with conscious sedation including aspiration, arrhythmia, respiratory failure and death), benefits (restoration of normal sinus rhythm) and alternatives of a direct current cardioversion were explained in detail °Patient willing to proceed. ° °Signed, °Tim Helix Lafontaine, MD, Ph.D °[facility]  °

## 2022-04-02 NOTE — Anesthesia Postprocedure Evaluation (Signed)
Anesthesia Post Note  Patient: Ariana Riggs  Procedure(s) Performed: CARDIOVERSION  Patient location during evaluation: PACU Anesthesia Type: General Level of consciousness: awake and alert, oriented and patient cooperative Pain management: pain level controlled Vital Signs Assessment: post-procedure vital signs reviewed and stable Respiratory status: spontaneous breathing, nonlabored ventilation and respiratory function stable Cardiovascular status: blood pressure returned to baseline and stable Postop Assessment: adequate PO intake Anesthetic complications: no   No notable events documented.   Last Vitals:  Vitals:   04/01/22 0840 04/01/22 0850  BP: 99/73 101/65  Pulse: 61 70  Resp: 15 19  Temp:    SpO2: 95% 92%    Last Pain: There were no vitals filed for this visit.               Darrin Nipper

## 2022-04-02 NOTE — Interval H&P Note (Signed)
History and Physical Interval Note:  04/02/2022 6:42 PM  Ariana Riggs  has presented today for surgery, with the diagnosis of Cardioversion   Afib.  The various methods of treatment have been discussed with the patient and family. After consideration of risks, benefits and other options for treatment, the patient has consented to  Procedure(s): CARDIOVERSION (N/A) as a surgical intervention.  The patient's history has been reviewed, patient examined, no change in status, stable for surgery.  I have reviewed the patient's chart and labs.  Questions were answered to the patient's satisfaction.     [name]

## 2022-04-04 ENCOUNTER — Encounter (INDEPENDENT_AMBULATORY_CARE_PROVIDER_SITE_OTHER): Payer: Self-pay | Admitting: Nurse Practitioner

## 2022-04-05 NOTE — Addendum Note (Signed)
Addended by: Michel Santee on: 04/05/2022 11:35 AM   Modules accepted: Orders

## 2022-04-07 ENCOUNTER — Other Ambulatory Visit: Payer: Self-pay | Admitting: Nurse Practitioner

## 2022-04-09 ENCOUNTER — Encounter: Payer: Self-pay | Admitting: Internal Medicine

## 2022-04-09 ENCOUNTER — Ambulatory Visit: Payer: 59 | Attending: Internal Medicine | Admitting: Internal Medicine

## 2022-04-09 VITALS — BP 100/48 | HR 47 | Ht 66.0 in | Wt 192.5 lb

## 2022-04-09 DIAGNOSIS — I480 Paroxysmal atrial fibrillation: Secondary | ICD-10-CM

## 2022-04-09 DIAGNOSIS — I5022 Chronic systolic (congestive) heart failure: Secondary | ICD-10-CM

## 2022-04-09 DIAGNOSIS — I4819 Other persistent atrial fibrillation: Secondary | ICD-10-CM

## 2022-04-09 DIAGNOSIS — I2510 Atherosclerotic heart disease of native coronary artery without angina pectoris: Secondary | ICD-10-CM

## 2022-04-09 DIAGNOSIS — K7460 Unspecified cirrhosis of liver: Secondary | ICD-10-CM

## 2022-04-09 DIAGNOSIS — E785 Hyperlipidemia, unspecified: Secondary | ICD-10-CM | POA: Diagnosis not present

## 2022-04-09 DIAGNOSIS — R188 Other ascites: Secondary | ICD-10-CM

## 2022-04-09 MED ORDER — METOPROLOL SUCCINATE ER 50 MG PO TB24: 50.0000 mg | ORAL_TABLET | Freq: Two times a day (BID) | ORAL | 3 refills | Status: DC

## 2022-04-09 MED ORDER — POTASSIUM CHLORIDE CRYS ER 20 MEQ PO TBCR: 40.0000 meq | EXTENDED_RELEASE_TABLET | Freq: Every day | ORAL | 3 refills | Status: DC

## 2022-04-09 MED ORDER — SPIRONOLACTONE 50 MG PO TABS: 50.0000 mg | ORAL_TABLET | Freq: Every day | ORAL | 3 refills | Status: DC
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
## 2022-04-09 NOTE — Patient Instructions (Signed)
Medication Instructions:  Your physician recommends the following medication changes.  DECREASE: Metoprolol Succinate to 50 mg by mouth daily Potassium to 40 meq by mouth daily  INCREASE: Spironolactone 50 mg by mouth daily   *If you need a refill on your cardiac medications before your next appointment, please call your pharmacy*   Lab Work: Your provider would like for you to return in 1 week to have the following labs drawn: (BMP).   Please go to the [facility] entrance and check in at the front desk.  You do not need an appointment.  They are open from 7am-6 pm.  You will not need to be fasting.  If you have labs (blood work) drawn today and your tests are completely normal, you will receive your results only by: [place] (if you have MyChart) OR A paper copy in the mail If you have any lab test that is abnormal or we need to change your treatment, we will call you to review the results.   Testing/Procedures: None ordered today   Follow-Up: At [facility], you and your health needs are our priority.  As part of our continuing mission to provide you with exceptional heart care, we have created designated Provider Care Teams.  These Care Teams include your primary Cardiologist (physician) and Advanced Practice Providers (APPs -  Physician Assistants and Nurse Practitioners) who all work together to provide you with the care you need, when you need it.  We recommend signing up for the patient portal called "MyChart".  Sign up information is provided on this After Visit Summary.  MyChart is used to connect with patients for Virtual Visits (Telemedicine).  Patients are able to view lab/test results, encounter notes, upcoming appointments, etc.  Non-urgent messages can be sent to your provider as well.   To learn more about what you can do with MyChart, go to [email or web address].    Your next appointment:   2 month(s)  Provider:   You may see  Nelva Bush, MD or one of the following Advanced Practice Providers on your designated Care Team:   Murray Hodgkins, NP Christell Faith, PA-C Cadence Kathlen Mody, PA-C Gerrie Nordmann, NP

## 2022-04-10 ENCOUNTER — Encounter: Payer: Self-pay | Admitting: Internal Medicine

## 2022-04-15 ENCOUNTER — Ambulatory Visit: Payer: 59 | Admitting: Internal Medicine

## 2022-04-19 ENCOUNTER — Other Ambulatory Visit: Payer: Self-pay

## 2022-04-19 ENCOUNTER — Other Ambulatory Visit
Admission: RE | Admit: 2022-04-19 | Discharge: 2022-04-19 | Disposition: A | Discharge status: Home / Self Care | Payer: 59 | Source: Ambulatory Visit | Attending: Internal Medicine | Admitting: Internal Medicine

## 2022-04-19 DIAGNOSIS — Z79899 Other long term (current) drug therapy: Secondary | ICD-10-CM | POA: Insufficient documentation

## 2022-04-19 LAB — BASIC METABOLIC PANEL
Anion gap: 11 (ref 5–15)
BUN: 19 mg/dL (ref 8–23)
CO2: 27 mmol/L (ref 22–32)
Calcium: 9.8 mg/dL (ref 8.9–10.3)
Chloride: 99 mmol/L (ref 98–111)
Creatinine, Ser: 0.9 mg/dL (ref 0.44–1.00)
GFR, Estimated: >60 mL/min (ref >60)
Glucose, Bld: 162 mg/dL — ABNORMAL HIGH (ref 70–99)
Potassium: 4.4 mmol/L (ref 3.5–5.1)
Sodium: 137 mmol/L (ref 135–145)
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
## 2022-04-27 NOTE — Progress Notes (Unsigned)
Electrophysiology Office Note:    Date:  04/28/2022   ID:  Ariana Riggs, DOB [birth year], MRN [id number]  [place] Cardiologist:  Nelva Bush, MD  [facility] Electrophysiologist:  Vickie Epley, MD   Referring MD: Minna Merritts, MD   Chief Complaint: Atrial fibrillation  History of Present Illness:    Ariana Riggs is a 62 y.o. female who I am seeing today for an evaluation of atrial fibrillation at the request of Dr. Saunders Revel.  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  The patient also has a history of surgical ASD repair.  There is a small Dacron patch on the superior portion of the septum..  The patient last saw Dr. Saunders Revel April 09, 2022.  The patient has a history of thyroid storm which is complicated her A-fib management.  At the appointment with Dr. Saunders Revel, her metoprolol was down titrated to see if this would help with fatigue.  Her last TSH on December 23, 2021 was 1.97.    Their past medical, social and family history was reveiwed.   ROS:   Please see the history of present illness.    All other systems reviewed and are negative.  EKGs/Labs/Other Studies Reviewed:    The following studies were reviewed today:  April 09, 2022 EKG reviewed and shows sinus bradycardia with a QTc of 505 ms.     Physical Exam:    VS:  BP 108/62   Pulse 65   Ht '5\' 6"'$  (1.676 m)   Wt 185 lb 9.6 oz (84.2 kg)   LMP  (LMP Unknown)   SpO2 97%   BMI 29.96 kg/m     Wt Readings from Last 3 Encounters:  04/28/22 185 lb 9.6 oz (84.2 kg)  04/09/22 192 lb 8 oz (87.3 kg)  04/01/22 191 lb (86.6 kg)     GEN:  Well nourished, well developed in no acute distress.  Obese CARDIAC: RRR, no murmurs, rubs, gallops RESPIRATORY:  Clear to auscultation without rales, wheezing or rhonchi       ASSESSMENT AND  PLAN:    1. Persistent atrial fibrillation ([place])   2. Chronic HFrEF (heart failure with reduced ejection fraction) ([place])   3. Cirrhosis of liver with ascites, unspecified hepatic cirrhosis type ([place])   4. H/O congenital atrial septal defect (ASD) repair     #Persistent atrial fibrillation Associated with systolic heart failure.  Rhythm control is indicated.  Options for rhythm control are limited because of QT prolongation and history of significant hyperthyroidism.  I discussed treatment options.  I do not think we have a reasonable antiarrhythmic drug option (sotalol and Tikosyn contraindicated because of QTc prolongation, amiodarone contraindicated because of recent thyroid issues, Multaq contraindicated because of heart failure history).  I discussed ablation in detail with the patient include the risks and recovery and likelihood of success and she wishes to proceed with scheduling.  We did discuss how her history of surgical ASD repair impacts risks and possible success rate of the ablation procedure.  I have reviewed her CT scan which shows the Dacron patch is superiorly positioned on the interatrial septum.  I believe there is a rim of tissue through which I can access the left atrium more inferior to the patch.  We discussed how if this is not the case, would abandon the procedure and plan for medical therapy.  Continue Eliquis for stroke prophylaxis  Discussed treatment options today for their AF including antiarrhythmic drug therapy and ablation. Discussed risks, recovery and likelihood of success. Discussed potential need for repeat ablation procedures and antiarrhythmic drugs after an initial ablation. They wish to proceed with scheduling.  Risk, benefits, and alternatives to EP study and radiofrequency ablation for afib were also discussed in detail today. These risks include but are not limited to stroke, bleeding, vascular damage, tamponade, perforation, damage to the esophagus,  lungs, and other structures, pulmonary vein stenosis, worsening renal function, and death. The patient understands these risk and wishes to proceed.  We will therefore proceed with catheter ablation at the next available time.  Carto, ICE, anesthesia are requested for the procedure.  Will also obtain CT PV protocol prior to the procedure to exclude LAA thrombus and further evaluate atrial anatomy.   #Prolonged QTc Avoid QT prolonging medications.     #History of cirrhosis Platelet count acceptable for above procedure.  Recent ultrasound of the liver shows normal echogenicity.    Signed, Hilton Cork. Quentin Ore, MD, [facility], [facility] 04/28/2022 4:17 PM    Electrophysiology  [facility]

## 2022-04-28 ENCOUNTER — Ambulatory Visit: Payer: 59 | Attending: Cardiology | Admitting: Cardiology

## 2022-04-28 ENCOUNTER — Encounter: Payer: Self-pay | Admitting: Cardiology

## 2022-04-28 VITALS — BP 108/62 | HR 65 | Ht 66.0 in | Wt 185.6 lb

## 2022-04-28 DIAGNOSIS — I5022 Chronic systolic (congestive) heart failure: Secondary | ICD-10-CM

## 2022-04-28 DIAGNOSIS — K7460 Unspecified cirrhosis of liver: Secondary | ICD-10-CM

## 2022-04-28 DIAGNOSIS — I4819 Other persistent atrial fibrillation: Secondary | ICD-10-CM | POA: Diagnosis not present

## 2022-04-28 DIAGNOSIS — Z8774 Personal history of (corrected) congenital malformations of heart and circulatory system: Secondary | ICD-10-CM | POA: Diagnosis not present

## 2022-04-28 DIAGNOSIS — R188 Other ascites: Secondary | ICD-10-CM

## 2022-04-28 NOTE — Patient Instructions (Addendum)
Medication Instructions:  Your physician recommends that you continue on your current medications as directed. Please refer to the Current Medication list given to you today.  *If you need a refill on your cardiac medications before your next appointment, please call your pharmacy*  Lab Work: BMET and CBC prior to CT scan and ablation (around June 20th) You will get your lab work at [facility]) hospital.  Your lab work will be done at [facility] next to [employer]. These are walk in labs- you will not need an appointment and you do not need to be fasting.    Testing/Procedures: Your physician has requested that you have cardiac CT. Cardiac computed tomography (CT) is a painless test that uses an x-ray machine to take clear, detailed pictures of your heart. For further information please visit [email or web address]. Please follow instruction sheet as given. We will call you to schedule your CT scan.   Your physician has recommended that you have an ablation. Catheter ablation is a medical procedure used to treat some cardiac arrhythmias (irregular heartbeats). During catheter ablation, a long, thin, flexible tube is put into a blood vessel in your groin (upper thigh), or neck. This tube is called an ablation catheter. It is then guided to your heart through the blood vessel. Radio frequency waves destroy small areas of heart tissue where abnormal heartbeats may cause an arrhythmia to start. Please see the instruction sheet given to you today. You are scheduled for Atrial Fibrillation Ablation on Friday, July 5 with Dr. Lars Mage.Please arrive at the Main Entrance A at [facility]: [address] [place], [place] [address] at 5:30 AM   Follow-Up: At [facility], you and your health needs are our priority.  As part of our continuing mission to provide you with exceptional heart care, we have created designated Provider Care Teams.  These Care Teams include  your primary Cardiologist (physician) and Advanced Practice Providers (APPs -  Physician Assistants and Nurse Practitioners) who all work together to provide you with the care you need, when you need it.  Your next appointment:   We will call you to arrange your follow up appointments

## 2022-05-04 ENCOUNTER — Inpatient Hospital Stay: Payer: 59 | Attending: Oncology

## 2022-05-04 ENCOUNTER — Inpatient Hospital Stay (HOSPITAL_BASED_OUTPATIENT_CLINIC_OR_DEPARTMENT_OTHER): Payer: 59 | Admitting: Oncology

## 2022-05-04 ENCOUNTER — Encounter: Payer: Self-pay | Admitting: Oncology

## 2022-05-04 VITALS — BP 125/62 | HR 63 | Temp 96.5°F | Resp 18 | Ht 66.0 in | Wt 191.9 lb

## 2022-05-04 DIAGNOSIS — Z79811 Long term (current) use of aromatase inhibitors: Secondary | ICD-10-CM | POA: Insufficient documentation

## 2022-05-04 DIAGNOSIS — F17210 Nicotine dependence, cigarettes, uncomplicated: Secondary | ICD-10-CM | POA: Insufficient documentation

## 2022-05-04 DIAGNOSIS — Z5181 Encounter for therapeutic drug level monitoring: Secondary | ICD-10-CM

## 2022-05-04 DIAGNOSIS — Z170 Estrogen receptor positive status [ER+]: Secondary | ICD-10-CM | POA: Diagnosis not present

## 2022-05-04 DIAGNOSIS — C50412 Malignant neoplasm of upper-outer quadrant of left female breast: Secondary | ICD-10-CM | POA: Diagnosis not present

## 2022-05-04 LAB — COMPREHENSIVE METABOLIC PANEL
ALT: 18 U/L (ref 0–44)
AST: 26 U/L (ref 15–41)
Albumin: 4.6 g/dL (ref 3.5–5.0)
Alkaline Phosphatase: 134 U/L — ABNORMAL HIGH (ref 38–126)
Anion gap: 10 (ref 5–15)
BUN: 22 mg/dL (ref 8–23)
CO2: 25 mmol/L (ref 22–32)
Calcium: 9.4 mg/dL (ref 8.9–10.3)
Chloride: 100 mmol/L (ref 98–111)
Creatinine, Ser: 0.98 mg/dL (ref 0.44–1.00)
GFR, Estimated: >60 mL/min (ref >60)
Glucose, Bld: 164 mg/dL — ABNORMAL HIGH (ref 70–99)
Potassium: 4.1 mmol/L (ref 3.5–5.1)
Sodium: 135 mmol/L (ref 135–145)
Total Bilirubin: 1.8 mg/dL — ABNORMAL HIGH (ref 0.3–1.2)
Total Protein: 8.2 g/dL — ABNORMAL HIGH (ref 6.5–8.1)
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
## 2022-05-04 NOTE — Progress Notes (Signed)
Patient wants to know when she should have mammogram, she states that she has some soreness on left breast.

## 2022-05-05 ENCOUNTER — Encounter: Payer: Self-pay | Admitting: Radiation Oncology

## 2022-05-05 ENCOUNTER — Ambulatory Visit
Admission: RE | Admit: 2022-05-05 | Discharge: 2022-05-05 | Disposition: A | Discharge status: Home / Self Care | Payer: 59 | Source: Ambulatory Visit | Attending: Radiation Oncology | Admitting: Radiation Oncology

## 2022-05-05 VITALS — BP 105/56 | HR 59 | Temp 98.4°F | Resp 20 | Ht 66.0 in | Wt 188.8 lb

## 2022-05-05 DIAGNOSIS — Z170 Estrogen receptor positive status [ER+]: Secondary | ICD-10-CM | POA: Diagnosis not present

## 2022-05-05 DIAGNOSIS — Z923 Personal history of irradiation: Secondary | ICD-10-CM | POA: Diagnosis not present

## 2022-05-05 DIAGNOSIS — Z79811 Long term (current) use of aromatase inhibitors: Secondary | ICD-10-CM | POA: Insufficient documentation

## 2022-05-05 DIAGNOSIS — C50412 Malignant neoplasm of upper-outer quadrant of left female breast: Secondary | ICD-10-CM | POA: Insufficient documentation

## 2022-05-05 NOTE — Progress Notes (Signed)
Survivorship Care Plan visit completed.  Treatment summary reviewed and given to patient.  ASCO answers booklet reviewed and given to patient.  CARE program and Cancer Transitions discussed with patient along with other resources cancer center offers to patients and caregivers.  Patient verbalized understanding.    

## 2022-05-06 NOTE — Progress Notes (Signed)
Radiation Oncology Follow up Note  Name: Ariana Riggs   Date:   05/05/2022 MRN:  [id number] DOB: 1960-04-14    This 62 y.o. female presents to the clinic today for 59-month follow-up status post whole breast radiation to her left breast for stage Ia ER/PR positive invasive mammary carcinoma.  REFERRING PROVIDER: Latanya Maudlin, NP  HPI: Patient is a 62 year old female now out 6 months having completed whole breast radiation to her left breast for stage Ia ER/PR positive invasive mammary carcinoma.  Seen today in routine follow-up she is doing well.  Specifically denies breast tenderness cough or bone pain..  She has mammogram scheduled for June.  She is currently on Arimidex tolerating it well without side effect.  COMPLICATIONS OF TREATMENT: none  FOLLOW UP COMPLIANCE: keeps appointments   PHYSICAL EXAM:  BP (!) 105/56 Comment: Recheck patient advised to call PCP for BP management  Pulse (!) 59   Temp 98.4 F (36.9 C) (Tympanic)   Resp 20   Ht 5\' 6"  (1.676 m)   Wt 188 lb 12.8 oz (85.6 kg)   LMP  (LMP Unknown)   BMI 30.47 kg/m  Lungs are clear to A&P cardiac examination essentially unremarkable with regular rate and rhythm. No dominant mass or nodularity is noted in either breast in 2 positions examined. Incision is well-healed. No axillary or supraclavicular adenopathy is appreciated. Cosmetic result is excellent.  Well-developed well-nourished patient in NAD. HEENT reveals PERLA, EOMI, discs not visualized.  Oral cavity is clear. No oral mucosal lesions are identified. Neck is clear without evidence of cervical or supraclavicular adenopathy. Lungs are clear to A&P. Cardiac examination is essentially unremarkable with regular rate and rhythm without murmur rub or thrill. Abdomen is benign with no organomegaly or masses noted. Motor sensory and DTR levels are equal and symmetric in the upper and lower extremities. Cranial nerves II through XII are grossly intact. Proprioception is  intact. No peripheral adenopathy or edema is identified. No motor or sensory levels are noted. Crude visual fields are within normal range.  RADIOLOGY RESULTS: No current films for review  PLAN: Present time patient is doing well 6 months out from whole breast radiation and pleased with her overall progress.  Asked to see her back in 6 months for follow-up.  Will review her mammograms when they become available.  She continues on Arimidex withoutSide effect patient is to call with any concerns.  I would like to take this opportunity to thank you for allowing me to participate in the care of your patient.Noreene Filbert, MD

## 2022-05-24 ENCOUNTER — Emergency Department: Payer: 59

## 2022-05-24 ENCOUNTER — Inpatient Hospital Stay
Admission: EM | Admit: 2022-05-24 | Discharge: 2022-05-27 | DRG: 193 | Disposition: A | Discharge status: Home / Self Care | Payer: 59 | Attending: Student | Admitting: Student

## 2022-05-24 ENCOUNTER — Other Ambulatory Visit: Payer: Self-pay

## 2022-05-24 DIAGNOSIS — E1122 Type 2 diabetes mellitus with diabetic chronic kidney disease: Secondary | ICD-10-CM | POA: Diagnosis present

## 2022-05-24 DIAGNOSIS — Z833 Family history of diabetes mellitus: Secondary | ICD-10-CM

## 2022-05-24 DIAGNOSIS — J440 Chronic obstructive pulmonary disease with acute lower respiratory infection: Secondary | ICD-10-CM | POA: Diagnosis present

## 2022-05-24 DIAGNOSIS — I5022 Chronic systolic (congestive) heart failure: Secondary | ICD-10-CM | POA: Diagnosis not present

## 2022-05-24 DIAGNOSIS — I4819 Other persistent atrial fibrillation: Secondary | ICD-10-CM

## 2022-05-24 DIAGNOSIS — F418 Other specified anxiety disorders: Secondary | ICD-10-CM

## 2022-05-24 DIAGNOSIS — F419 Anxiety disorder, unspecified: Secondary | ICD-10-CM | POA: Diagnosis present

## 2022-05-24 DIAGNOSIS — G4733 Obstructive sleep apnea (adult) (pediatric): Secondary | ICD-10-CM

## 2022-05-24 DIAGNOSIS — G9340 Encephalopathy, unspecified: Secondary | ICD-10-CM

## 2022-05-24 DIAGNOSIS — G9341 Metabolic encephalopathy: Secondary | ICD-10-CM | POA: Diagnosis present

## 2022-05-24 DIAGNOSIS — R319 Hematuria, unspecified: Secondary | ICD-10-CM | POA: Diagnosis present

## 2022-05-24 DIAGNOSIS — Z8249 Family history of ischemic heart disease and other diseases of the circulatory system: Secondary | ICD-10-CM

## 2022-05-24 DIAGNOSIS — I255 Ischemic cardiomyopathy: Secondary | ICD-10-CM | POA: Diagnosis present

## 2022-05-24 DIAGNOSIS — Z803 Family history of malignant neoplasm of breast: Secondary | ICD-10-CM

## 2022-05-24 DIAGNOSIS — Z955 Presence of coronary angioplasty implant and graft: Secondary | ICD-10-CM

## 2022-05-24 DIAGNOSIS — Z79899 Other long term (current) drug therapy: Secondary | ICD-10-CM

## 2022-05-24 DIAGNOSIS — Z9049 Acquired absence of other specified parts of digestive tract: Secondary | ICD-10-CM

## 2022-05-24 DIAGNOSIS — Z8616 Personal history of COVID-19: Secondary | ICD-10-CM

## 2022-05-24 DIAGNOSIS — Z1152 Encounter for screening for COVID-19: Secondary | ICD-10-CM

## 2022-05-24 DIAGNOSIS — F1911 Other psychoactive substance abuse, in remission: Secondary | ICD-10-CM | POA: Diagnosis present

## 2022-05-24 DIAGNOSIS — C50412 Malignant neoplasm of upper-outer quadrant of left female breast: Secondary | ICD-10-CM

## 2022-05-24 DIAGNOSIS — R188 Other ascites: Secondary | ICD-10-CM

## 2022-05-24 DIAGNOSIS — J9601 Acute respiratory failure with hypoxia: Secondary | ICD-10-CM | POA: Diagnosis not present

## 2022-05-24 DIAGNOSIS — Z6829 Body mass index (BMI) 29.0-29.9, adult: Secondary | ICD-10-CM

## 2022-05-24 DIAGNOSIS — I428 Other cardiomyopathies: Secondary | ICD-10-CM | POA: Diagnosis present

## 2022-05-24 DIAGNOSIS — Z880 Allergy status to penicillin: Secondary | ICD-10-CM

## 2022-05-24 DIAGNOSIS — E1169 Type 2 diabetes mellitus with other specified complication: Secondary | ICD-10-CM

## 2022-05-24 DIAGNOSIS — Z8042 Family history of malignant neoplasm of prostate: Secondary | ICD-10-CM

## 2022-05-24 DIAGNOSIS — Z7901 Long term (current) use of anticoagulants: Secondary | ICD-10-CM

## 2022-05-24 DIAGNOSIS — N182 Chronic kidney disease, stage 2 (mild): Secondary | ICD-10-CM | POA: Diagnosis present

## 2022-05-24 DIAGNOSIS — Z800 Family history of malignant neoplasm of digestive organs: Secondary | ICD-10-CM

## 2022-05-24 DIAGNOSIS — J122 Parainfluenza virus pneumonia: Principal | ICD-10-CM | POA: Diagnosis present

## 2022-05-24 DIAGNOSIS — I130 Hypertensive heart and chronic kidney disease with heart failure and stage 1 through stage 4 chronic kidney disease, or unspecified chronic kidney disease: Secondary | ICD-10-CM | POA: Diagnosis present

## 2022-05-24 DIAGNOSIS — Z888 Allergy status to other drugs, medicaments and biological substances status: Secondary | ICD-10-CM

## 2022-05-24 DIAGNOSIS — Z9989 Dependence on other enabling machines and devices: Secondary | ICD-10-CM

## 2022-05-24 DIAGNOSIS — F1010 Alcohol abuse, uncomplicated: Secondary | ICD-10-CM

## 2022-05-24 DIAGNOSIS — Z8774 Personal history of (corrected) congenital malformations of heart and circulatory system: Secondary | ICD-10-CM

## 2022-05-24 DIAGNOSIS — Z79811 Long term (current) use of aromatase inhibitors: Secondary | ICD-10-CM

## 2022-05-24 DIAGNOSIS — E669 Obesity, unspecified: Secondary | ICD-10-CM | POA: Diagnosis present

## 2022-05-24 DIAGNOSIS — I2510 Atherosclerotic heart disease of native coronary artery without angina pectoris: Secondary | ICD-10-CM | POA: Diagnosis present

## 2022-05-24 DIAGNOSIS — Z881 Allergy status to other antibiotic agents status: Secondary | ICD-10-CM

## 2022-05-24 DIAGNOSIS — Z882 Allergy status to sulfonamides status: Secondary | ICD-10-CM

## 2022-05-24 DIAGNOSIS — E861 Hypovolemia: Secondary | ICD-10-CM | POA: Diagnosis present

## 2022-05-24 DIAGNOSIS — Z7951 Long term (current) use of inhaled steroids: Secondary | ICD-10-CM

## 2022-05-24 DIAGNOSIS — J441 Chronic obstructive pulmonary disease with (acute) exacerbation: Principal | ICD-10-CM

## 2022-05-24 DIAGNOSIS — K7031 Alcoholic cirrhosis of liver with ascites: Secondary | ICD-10-CM | POA: Diagnosis present

## 2022-05-24 DIAGNOSIS — Z792 Long term (current) use of antibiotics: Secondary | ICD-10-CM

## 2022-05-24 DIAGNOSIS — K7460 Unspecified cirrhosis of liver: Secondary | ICD-10-CM

## 2022-05-24 DIAGNOSIS — R9431 Abnormal electrocardiogram [ECG] [EKG]: Secondary | ICD-10-CM

## 2022-05-24 DIAGNOSIS — F319 Bipolar disorder, unspecified: Secondary | ICD-10-CM | POA: Diagnosis present

## 2022-05-24 DIAGNOSIS — E785 Hyperlipidemia, unspecified: Secondary | ICD-10-CM | POA: Diagnosis present

## 2022-05-24 DIAGNOSIS — Z8049 Family history of malignant neoplasm of other genital organs: Secondary | ICD-10-CM

## 2022-05-24 DIAGNOSIS — Z808 Family history of malignant neoplasm of other organs or systems: Secondary | ICD-10-CM

## 2022-05-24 DIAGNOSIS — F17210 Nicotine dependence, cigarettes, uncomplicated: Secondary | ICD-10-CM | POA: Diagnosis present

## 2022-05-24 DIAGNOSIS — R509 Fever, unspecified: Secondary | ICD-10-CM

## 2022-05-24 DIAGNOSIS — E876 Hypokalemia: Secondary | ICD-10-CM | POA: Diagnosis present

## 2022-05-24 DIAGNOSIS — Z170 Estrogen receptor positive status [ER+]: Secondary | ICD-10-CM

## 2022-05-24 LAB — BLOOD GAS, VENOUS
Acid-Base Excess: 3.7 mmol/L — ABNORMAL HIGH (ref 0.0–2.0)
Bicarbonate: 29.7 mmol/L — ABNORMAL HIGH (ref 20.0–28.0)
O2 Saturation: 70.4 %
Patient temperature: 37
pCO2, Ven: 49 mmHg (ref 44–60)
pH, Ven: 7.39 (ref 7.25–7.43)
pO2, Ven: 44 mmHg (ref 32–45)

## 2022-05-24 LAB — COMPREHENSIVE METABOLIC PANEL
ALT: 21 U/L (ref 0–44)
AST: 43 U/L — ABNORMAL HIGH (ref 15–41)
Albumin: 4.8 g/dL (ref 3.5–5.0)
Alkaline Phosphatase: 121 U/L (ref 38–126)
Anion gap: 12 (ref 5–15)
BUN: 23 mg/dL (ref 8–23)
CO2: 27 mmol/L (ref 22–32)
Calcium: 8.8 mg/dL — ABNORMAL LOW (ref 8.9–10.3)
Chloride: 95 mmol/L — ABNORMAL LOW (ref 98–111)
Creatinine, Ser: 0.96 mg/dL (ref 0.44–1.00)
GFR, Estimated: >60 mL/min (ref >60)
Glucose, Bld: 164 mg/dL — ABNORMAL HIGH (ref 70–99)
Potassium: 3.5 mmol/L (ref 3.5–5.1)
Sodium: 134 mmol/L — ABNORMAL LOW (ref 135–145)
Total Bilirubin: 2.8 mg/dL — ABNORMAL HIGH (ref 0.3–1.2)
Total Protein: 8.3 g/dL — ABNORMAL HIGH (ref 6.5–8.1)

## 2022-05-24 LAB — URINALYSIS, ROUTINE W REFLEX MICROSCOPIC
Bacteria, UA: NONE SEEN
Bilirubin Urine: NEGATIVE
Glucose, UA: NEGATIVE mg/dL
Ketones, ur: NEGATIVE mg/dL
Leukocytes,Ua: NEGATIVE
Nitrite: NEGATIVE
Protein, ur: NEGATIVE mg/dL
Specific Gravity, Urine: 1.005 (ref 1.005–1.030)
pH: 5 (ref 5.0–8.0)

## 2022-05-24 LAB — CBC WITH DIFFERENTIAL/PLATELET
Abs Immature Granulocytes: 0.03 10*3/uL (ref 0.00–0.07)
Basophils Absolute: 0 10*3/uL (ref 0.0–0.1)
Basophils Relative: 0 %
Eosinophils Absolute: 0 10*3/uL (ref 0.0–0.5)
Eosinophils Relative: 0 %
HCT: 36 % (ref 36.0–46.0)
Hemoglobin: 12.8 g/dL (ref 12.0–15.0)
Immature Granulocytes: 0 %
Lymphocytes Relative: 6 %
Lymphs Abs: 0.4 10*3/uL — ABNORMAL LOW (ref 0.7–4.0)
MCH: 33.3 pg (ref 26.0–34.0)
MCHC: 35.6 g/dL (ref 30.0–36.0)
MCV: 93.8 fL (ref 80.0–100.0)
Monocytes Absolute: 0.2 10*3/uL (ref 0.1–1.0)
Monocytes Relative: 3 %
Neutro Abs: 6.3 10*3/uL (ref 1.7–7.7)
Neutrophils Relative %: 91 %
Platelets: 107 10*3/uL — ABNORMAL LOW (ref 150–400)
RBC: 3.84 MIL/uL — ABNORMAL LOW (ref 3.87–5.11)
RDW: 16 % — ABNORMAL HIGH (ref 11.5–15.5)
WBC: 7 10*3/uL (ref 4.0–10.5)
nRBC: 0 % (ref 0.0–0.2)

## 2022-05-24 LAB — TROPONIN I (HIGH SENSITIVITY)
Troponin I (High Sensitivity): 31 ng/L — ABNORMAL HIGH (ref <18)
Troponin I (High Sensitivity): 30 ng/L — ABNORMAL HIGH (ref <18)

## 2022-05-24 LAB — BRAIN NATRIURETIC PEPTIDE: B Natriuretic Peptide: 289 pg/mL — ABNORMAL HIGH (ref 0.0–100.0)

## 2022-05-24 LAB — RESP PANEL BY RT-PCR (RSV, FLU A&B, COVID)  RVPGX2
Influenza A by PCR: NEGATIVE
Influenza B by PCR: NEGATIVE
Resp Syncytial Virus by PCR: NEGATIVE
SARS Coronavirus 2 by RT PCR: NEGATIVE

## 2022-05-24 LAB — LACTIC ACID, PLASMA
Lactic Acid, Venous: 1.6 mmol/L (ref 0.5–1.9)
Lactic Acid, Venous: 0.9 mmol/L (ref 0.5–1.9)

## 2022-05-24 LAB — PROCALCITONIN: Procalcitonin: 0.27 ng/mL

## 2022-05-24 MED ORDER — IPRATROPIUM-ALBUTEROL 0.5-2.5 (3) MG/3ML IN SOLN
3.0000 mL | Freq: Four times a day (QID) | RESPIRATORY_TRACT | Status: DC
  Administered 2022-05-24 – 2022-05-25 (×4): 3 mL via RESPIRATORY_TRACT
  Filled 2022-05-24 (×5): qty 3

## 2022-05-24 MED ORDER — PREDNISONE 20 MG PO TABS
40.0000 mg | ORAL_TABLET | Freq: Every day | ORAL | Status: DC
  Administered 2022-05-25 – 2022-05-27 (×3): 40 mg via ORAL
  Filled 2022-05-24 (×3): qty 2

## 2022-05-24 MED ORDER — FOLIC ACID 1 MG PO TABS
1.0000 mg | ORAL_TABLET | Freq: Every day | ORAL | Status: DC
  Administered 2022-05-25 – 2022-05-27 (×3): 1 mg via ORAL
  Filled 2022-05-24 (×3): qty 1

## 2022-05-24 MED ORDER — IPRATROPIUM-ALBUTEROL 0.5-2.5 (3) MG/3ML IN SOLN
3.0000 mL | Freq: Once | RESPIRATORY_TRACT | Status: AC
  Administered 2022-05-24: 3 mL via RESPIRATORY_TRACT
  Filled 2022-05-24: qty 3

## 2022-05-24 MED ORDER — ANASTROZOLE 1 MG PO TABS
1.0000 mg | ORAL_TABLET | Freq: Every day | ORAL | Status: DC
  Administered 2022-05-25 – 2022-05-27 (×3): 1 mg via ORAL
  Filled 2022-05-24 (×3): qty 1

## 2022-05-24 MED ORDER — APIXABAN 5 MG PO TABS
5.0000 mg | ORAL_TABLET | Freq: Two times a day (BID) | ORAL | Status: DC
  Administered 2022-05-25 – 2022-05-27 (×6): 5 mg via ORAL
  Filled 2022-05-24 (×6): qty 1

## 2022-05-24 MED ORDER — CITALOPRAM HYDROBROMIDE 10 MG PO TABS
20.0000 mg | ORAL_TABLET | Freq: Every day | ORAL | Status: DC
  Administered 2022-05-25 – 2022-05-27 (×3): 20 mg via ORAL
  Filled 2022-05-24 (×3): qty 2

## 2022-05-24 MED ORDER — BUSPIRONE HCL 10 MG PO TABS
10.0000 mg | ORAL_TABLET | Freq: Two times a day (BID) | ORAL | Status: DC
  Administered 2022-05-25 – 2022-05-27 (×6): 10 mg via ORAL
  Filled 2022-05-24: qty 1
  Filled 2022-05-24: qty 2
  Filled 2022-05-24 (×4): qty 1

## 2022-05-24 MED ORDER — LACTATED RINGERS IV BOLUS (SEPSIS)
1000.0000 mL | Freq: Once | INTRAVENOUS | Status: AC
  Administered 2022-05-24: 1000 mL via INTRAVENOUS

## 2022-05-24 MED ORDER — ATORVASTATIN CALCIUM 10 MG PO TABS
10.0000 mg | ORAL_TABLET | Freq: Every day | ORAL | Status: DC
  Administered 2022-05-25 – 2022-05-27 (×3): 10 mg via ORAL
  Filled 2022-05-24 (×3): qty 1

## 2022-05-24 MED ORDER — INSULIN ASPART 100 UNIT/ML IJ SOLN: 0.0000 [IU] | Freq: Three times a day (TID) | INTRAMUSCULAR | Status: DC

## 2022-05-24 MED ORDER — KETOROLAC TROMETHAMINE 15 MG/ML IJ SOLN
15.0000 mg | Freq: Once | INTRAMUSCULAR | Status: AC
  Administered 2022-05-24: 15 mg via INTRAVENOUS
  Filled 2022-05-24: qty 1

## 2022-05-24 MED ORDER — ACETAMINOPHEN 650 MG RE SUPP: 650.0000 mg | Freq: Four times a day (QID) | RECTAL | Status: DC | PRN

## 2022-05-24 MED ORDER — ONDANSETRON HCL 4 MG/2ML IJ SOLN: 4.0000 mg | Freq: Four times a day (QID) | INTRAMUSCULAR | Status: DC | PRN

## 2022-05-24 MED ORDER — LORAZEPAM 1 MG PO TABS
1.0000 mg | ORAL_TABLET | Freq: Two times a day (BID) | ORAL | Status: DC
  Administered 2022-05-25 – 2022-05-27 (×5): 1 mg via ORAL
  Filled 2022-05-24 (×5): qty 1

## 2022-05-24 MED ORDER — THIAMINE MONONITRATE 100 MG PO TABS
100.0000 mg | ORAL_TABLET | Freq: Every day | ORAL | Status: DC
  Administered 2022-05-25 – 2022-05-27 (×3): 100 mg via ORAL
  Filled 2022-05-24 (×3): qty 1

## 2022-05-24 MED ORDER — METOPROLOL SUCCINATE ER 50 MG PO TB24
50.0000 mg | ORAL_TABLET | Freq: Two times a day (BID) | ORAL | Status: DC
  Administered 2022-05-25 – 2022-05-27 (×5): 50 mg via ORAL
  Filled 2022-05-24 (×5): qty 1

## 2022-05-24 MED ORDER — SODIUM CHLORIDE 0.9% FLUSH
3.0000 mL | Freq: Two times a day (BID) | INTRAVENOUS | Status: DC
  Administered 2022-05-24 – 2022-05-27 (×6): 3 mL via INTRAVENOUS

## 2022-05-24 MED ORDER — ACETAMINOPHEN 325 MG PO TABS: 650.0000 mg | ORAL_TABLET | Freq: Four times a day (QID) | ORAL | Status: DC | PRN

## 2022-05-24 MED ORDER — SODIUM CHLORIDE 0.9 % IV SOLN
2.0000 g | INTRAVENOUS | Status: DC
  Administered 2022-05-24 – 2022-05-25 (×2): 2 g via INTRAVENOUS
  Filled 2022-05-24 (×2): qty 20

## 2022-05-24 MED ORDER — RIFAXIMIN 550 MG PO TABS
550.0000 mg | ORAL_TABLET | Freq: Two times a day (BID) | ORAL | Status: DC
  Administered 2022-05-24 – 2022-05-26 (×4): 550 mg via ORAL
  Filled 2022-05-24 (×4): qty 1

## 2022-05-24 MED ORDER — LOSARTAN POTASSIUM 25 MG PO TABS
12.5000 mg | ORAL_TABLET | Freq: Every day | ORAL | Status: DC
  Administered 2022-05-25 – 2022-05-27 (×3): 12.5 mg via ORAL
  Filled 2022-05-24 (×3): qty 1

## 2022-05-24 MED ORDER — ONDANSETRON HCL 4 MG PO TABS: 4.0000 mg | ORAL_TABLET | Freq: Four times a day (QID) | ORAL | Status: DC | PRN

## 2022-05-24 MED ORDER — LACTULOSE 10 GM/15ML PO SOLN
20.0000 g | Freq: Every day | ORAL | Status: DC
  Administered 2022-05-25 – 2022-05-26 (×2): 20 g via ORAL
  Filled 2022-05-24 (×2): qty 30

## 2022-05-24 MED ORDER — SODIUM CHLORIDE 0.9 % IV SOLN
500.0000 mg | INTRAVENOUS | Status: DC
  Administered 2022-05-24 – 2022-05-25 (×2): 500 mg via INTRAVENOUS
  Filled 2022-05-24 (×2): qty 5

## 2022-05-24 NOTE — Assessment & Plan Note (Addendum)
Patient's husband notes that patient was delirious earlier in the day, describing her as "loopy" and disoriented.  Since arriving to the ED, she seems significantly better but still not quite to her baseline. No asterixis on exam to suggest hepatic encephalopathy.  Most likely secondary to febrile illness with fever up to 104 on EMS arrival.  No findings at this time to suggest meningitis  - Delirium precautions - Restart home lactulose, once daily - Continue home rifaximin - Management of pneumonia as noted above - Hold home centrally acting medications as able

## 2022-05-24 NOTE — Assessment & Plan Note (Signed)
Currently diet controlled only.  - Given steroid use, SSI sensitive scale ordered

## 2022-05-24 NOTE — ED Notes (Signed)
Lab to add on urine.  °

## 2022-05-24 NOTE — Assessment & Plan Note (Addendum)
Patient is presenting with 1 day history of generalized malaise, shortness of breath, productive cough and fever concerning for community-acquired pneumonia.  Chest x-ray nondiagnostic, so CT chest has been ordered.  No evidence of sepsis on admission, however high risk  - Continue supplemental oxygen to maintain oxygen saturation above 88% - Wean as tolerated - Telemetry monitoring - Continue Ceftriaxone and azithromycin - RVP and procalcitonin pending - Strep pneumo and Legionella urinary antigens pending - Blood cultures pending - CT chest without contrast

## 2022-05-24 NOTE — Assessment & Plan Note (Signed)
QTc on EKG obtained today elevated at 469.  Will monitor closely while receiving QT prolonging agents  - Telemetry monitoring

## 2022-05-24 NOTE — ED Provider Notes (Signed)
[facility] Provider Note    Event Date/Time   First MD Initiated Contact with Patient 05/24/22 1953     (approximate)   History   Shortness of Breath   HPI  Ariana Riggs is a 62 y.o. female with a history of COPD, CAD, CKD, diabetes, hyperlipidemia, sleep apnea on CPAP, liver cirrhosis, and atrial fibrillation on Eliquis, and breast cancer who presents with worsening shortness of breath over the last few days.  Per EMS the patient went to an outpatient clinic today and was diagnosed with COPD exacerbation and pneumonia.  She was started on steroid and antibiotic, but then her husband noted her to be confused and she was febrile apparently to 104.  The patient endorses cough, shortness of breath, and lightheadedness but no chest pain and no nausea or vomiting.  I reviewed the past medical records per the patient's most recent outpatient encounters were on 3/19 and 3/20 with medical oncology and radiation oncology.  She has no recent ED visits.  She was most recently hospitalized 1 year ago in March 2023 for hyperthyroidism with thyroid storm and atrial fibrillation with RVR.   Physical Exam   Triage Vital Signs: ED Triage Vitals  Enc Vitals Group     BP      Pulse      Resp      Temp      Temp src      SpO2      Weight      Height      Head Circumference      Peak Flow      Pain Score      Pain Loc      Pain Edu?      Excl. in GC?     Most recent vital signs: Vitals:   05/24/22 2130 05/24/22 2230  BP:    Pulse: 94 84  Resp: 19 20  Temp:    SpO2: 93% 95%     General: Alert and oriented, uncomfortable appearing but in no acute distress. CV:  Good peripheral perfusion.  Resp:  Increased respiratory effort.  Diffuse wheezing and rhonchi bilaterally. Abd:  No distention.  Other:  No significant peripheral edema.  EOMI.  PERRLA.  No facial droop.  Somewhat dry mucous membranes.  Motor intact in all extremities.   ED Results /  Procedures / Treatments   Labs (all labs ordered are listed, but only abnormal results are displayed) Labs Reviewed  COMPREHENSIVE METABOLIC PANEL - Abnormal; Notable for the following components:      Result Value   Sodium 134 (*)    Chloride 95 (*)    Glucose, Bld 164 (*)    Calcium 8.8 (*)    Total Protein 8.3 (*)    AST 43 (*)    Total Bilirubin 2.8 (*)    All other components within normal limits  CBC WITH DIFFERENTIAL/PLATELET - Abnormal; Notable for the following components:   RBC 3.84 (*)    RDW 16.0 (*)    Platelets 107 (*)    Lymphs Abs 0.4 (*)    All other components within normal limits  BRAIN NATRIURETIC PEPTIDE - Abnormal; Notable for the following components:   B Natriuretic Peptide 289.0 (*)    All other components within normal limits  URINALYSIS, ROUTINE W REFLEX MICROSCOPIC - Abnormal; Notable for the following components:   Color, Urine YELLOW (*)    APPearance CLEAR (*)    Hgb urine dipstick  MODERATE (*)    All other components within normal limits  BLOOD GAS, VENOUS - Abnormal; Notable for the following components:   Bicarbonate 29.7 (*)    Acid-Base Excess 3.7 (*)    All other components within normal limits  TROPONIN I (HIGH SENSITIVITY) - Abnormal; Notable for the following components:   Troponin I (High Sensitivity) 31 (*)    All other components within normal limits  TROPONIN I (HIGH SENSITIVITY) - Abnormal; Notable for the following components:   Troponin I (High Sensitivity) 30 (*)    All other components within normal limits  RESP PANEL BY RT-PCR (RSV, FLU A&B, COVID)  RVPGX2  CULTURE, BLOOD (ROUTINE X 2)  CULTURE, BLOOD (ROUTINE X 2)  LACTIC ACID, PLASMA  LACTIC ACID, PLASMA  PROCALCITONIN     EKG  ED ECG REPORT I, Dionne BucySebastian Malu Pellegrini, the attending physician, personally viewed and interpreted this ECG.  Date: 05/24/2022 EKG Time: 1957 Rate: 94 Rhythm: normal sinus rhythm QRS Axis: normal Intervals: Incomplete RBBB ST/T Wave  abnormalities: Nonspecific ST abnormalities diffusely Narrative Interpretation: no evidence of acute ischemia; no significant change when compared to EKG of 04/28/2022    RADIOLOGY  Chest x-ray: I independently viewed and interpreted the images; there is possible right lower lobe faint opacity but no focal consolidation or edema   PROCEDURES:  Critical Care performed: Yes, see critical care procedure note(s)  .Critical Care  Performed by: Dionne BucySiadecki, Jarissa Sheriff, MD Authorized by: Dionne BucySiadecki, Devora Tortorella, MD   Critical care provider statement:    Critical care time (minutes):  30   Critical care time was exclusive of:  Separately billable procedures and treating other patients   Critical care was necessary to treat or prevent imminent or life-threatening deterioration of the following conditions:  Respiratory failure and sepsis   Critical care was time spent personally by me on the following activities:  Development of treatment plan with patient or surrogate, discussions with consultants, evaluation of patient's response to treatment, examination of patient, ordering and review of laboratory studies, ordering and review of radiographic studies, ordering and performing treatments and interventions, pulse oximetry, re-evaluation of patient's condition, review of old charts and obtaining history from patient or surrogate   Care discussed with: admitting provider      MEDICATIONS ORDERED IN ED: Medications  cefTRIAXone (ROCEPHIN) 2 g in sodium chloride 0.9 % 100 mL IVPB (0 g Intravenous Stopped 05/24/22 2054)  azithromycin (ZITHROMAX) 500 mg in sodium chloride 0.9 % 250 mL IVPB (0 mg Intravenous Stopped 05/24/22 2221)  lactated ringers bolus 1,000 mL (0 mLs Intravenous Stopped 05/24/22 2116)  ipratropium-albuterol (DUONEB) 0.5-2.5 (3) MG/3ML nebulizer solution 3 mL (3 mLs Nebulization Given 05/24/22 2013)  ipratropium-albuterol (DUONEB) 0.5-2.5 (3) MG/3ML nebulizer solution 3 mL (3 mLs Nebulization  Given 05/24/22 2013)  ketorolac (TORADOL) 15 MG/ML injection 15 mg (15 mg Intravenous Given 05/24/22 2131)     IMPRESSION / MDM / ASSESSMENT AND PLAN / ED COURSE  I reviewed the triage vital signs and the nursing notes.  62 year old female with PMH as noted above presents with shortness of breath and cough over the last few days, diagnosed with COPD exacerbation and pneumonia today, and now with fever and altered mental status today.  On exam the patient is alert and oriented, with increased work of breathing and diffuse wheezing bilaterally on lung exam.  Differential diagnosis includes, but is not limited to, pneumonia, other acute infection/sepsis, COPD exacerbation, CHF exacerbation.  We will obtain chest x-ray, lab workup including cardiac enzymes,  BNP to rule out acute CHF, and sepsis labs, give fluids, empiric antibiotics for CAP, bronchodilators, and reassess.  Patient's presentation is most consistent with acute presentation with potential threat to life or bodily function.  The patient is on the cardiac monitor to evaluate for evidence of arrhythmia and/or significant heart rate changes.  ----------------------------------------- 10:50 PM on 05/24/2022 -----------------------------------------  Chest x-ray does not show a clear consolidation although there is some opacity in the lower lobes.  Respiratory panel is negative.  Urinalysis is also negative.  VBG is reassuring with a normal pCO2.  Lactate is normal.  Overall I suspect COPD exacerbation and likely viral bronchitis.  Given the history of possible altered mental status earlier and the fever I did consider whether the patient could have meningitis or other CNS process.  However, she has been alert and oriented throughout her ED stay.  Her husband confirms that she was "loopy" earlier but is better now.  She did have a headache earlier related to coughing, but does not have a headache at this time and has no meningeal signs on  exam.  I have ordered empiric antibiotics for CAP.  I consulted Dr. Huel Cote from the hospitalist service; based on our discussion she agrees to evaluate the patient for admission.   FINAL CLINICAL IMPRESSION(S) / ED DIAGNOSES   Final diagnoses:  COPD exacerbation  Febrile illness     Rx / DC Orders   ED Discharge Orders     None        Note:  This document was prepared using Dragon voice recognition software and may include unintentional dictation errors.    Dionne Bucy, MD 05/24/22 2251

## 2022-05-24 NOTE — Assessment & Plan Note (Addendum)
Per chart review, patient denies any recent alcohol use.  Unable to confirm if any recent use from patient's husband  - CIWA monitoring without Ativan - Will continue home scheduled Ativan

## 2022-05-24 NOTE — Assessment & Plan Note (Signed)
-   Continue home anastrozole ?
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
## 2022-05-24 NOTE — Assessment & Plan Note (Signed)
-   CPAP ordered for bedtime 

## 2022-05-24 NOTE — Assessment & Plan Note (Signed)
-   Continue home BuSpar, citalopram, Ativan

## 2022-05-24 NOTE — Assessment & Plan Note (Signed)
Patient has a history of COPD, likely exacerbated in the setting of community-acquired pneumonia.  - Continue prednisone 40 mg to complete a 5-day course - DuoNebs every 6 hours - Continue home bronchodilators

## 2022-05-24 NOTE — Assessment & Plan Note (Signed)
Patient has a history of HFrEF with recovered EF, Most recently 50-55%.  Given febrile illness and hypovolemia on examination, will hold home diuretics.  She has received 1 L thus far.  Given high risk for decompensation, will need to monitor volume status closely  - Telemetry monitoring - Holding home diuretics.  Restart when able - Continue home losartan and metoprolol

## 2022-05-24 NOTE — ED Triage Notes (Signed)
Pt to room 11 via [facility] from home. Seen at outpatient clinic today for shortness of breath, possible COPD exacerbation. Given Azithromycin and Prednisone rx and sent home. Per EMS report, pt became " delirious" with husband  at home. Patient 85% on RA per EMS placed on 10L NRB and oxygen saturation increased to 97%. Patient alert, diaphoretic, oriented x4 speaking in full clear sentences with slightly labored respirations.

## 2022-05-24 NOTE — Assessment & Plan Note (Signed)
Patient has a history of cirrhosis secondary to alcohol use.  Mild distention noted on admission  - Continue home rifaximin - Restart home lactulose once daily - Holding home diuretics in the setting of febrile illness

## 2022-05-24 NOTE — ED Notes (Signed)
RN requested verification of eliquis, RN advised by Pharmacy personnel, Pharmacist to verify when he returns

## 2022-05-24 NOTE — Assessment & Plan Note (Signed)
-   Continue home Eliquis and metoprolol 

## 2022-05-25 ENCOUNTER — Observation Stay: Payer: 59

## 2022-05-25 ENCOUNTER — Encounter: Payer: Self-pay | Admitting: Internal Medicine

## 2022-05-25 DIAGNOSIS — Z1152 Encounter for screening for COVID-19: Secondary | ICD-10-CM | POA: Diagnosis not present

## 2022-05-25 DIAGNOSIS — F319 Bipolar disorder, unspecified: Secondary | ICD-10-CM | POA: Diagnosis present

## 2022-05-25 DIAGNOSIS — K7031 Alcoholic cirrhosis of liver with ascites: Secondary | ICD-10-CM | POA: Diagnosis present

## 2022-05-25 DIAGNOSIS — E1169 Type 2 diabetes mellitus with other specified complication: Secondary | ICD-10-CM | POA: Diagnosis present

## 2022-05-25 DIAGNOSIS — F419 Anxiety disorder, unspecified: Secondary | ICD-10-CM | POA: Diagnosis present

## 2022-05-25 DIAGNOSIS — G9341 Metabolic encephalopathy: Secondary | ICD-10-CM | POA: Diagnosis present

## 2022-05-25 DIAGNOSIS — I4819 Other persistent atrial fibrillation: Secondary | ICD-10-CM | POA: Diagnosis present

## 2022-05-25 DIAGNOSIS — Z8616 Personal history of COVID-19: Secondary | ICD-10-CM | POA: Diagnosis not present

## 2022-05-25 DIAGNOSIS — I5022 Chronic systolic (congestive) heart failure: Secondary | ICD-10-CM | POA: Diagnosis present

## 2022-05-25 DIAGNOSIS — J440 Chronic obstructive pulmonary disease with acute lower respiratory infection: Secondary | ICD-10-CM | POA: Diagnosis present

## 2022-05-25 DIAGNOSIS — I428 Other cardiomyopathies: Secondary | ICD-10-CM | POA: Diagnosis present

## 2022-05-25 DIAGNOSIS — E1122 Type 2 diabetes mellitus with diabetic chronic kidney disease: Secondary | ICD-10-CM | POA: Diagnosis present

## 2022-05-25 DIAGNOSIS — I130 Hypertensive heart and chronic kidney disease with heart failure and stage 1 through stage 4 chronic kidney disease, or unspecified chronic kidney disease: Secondary | ICD-10-CM | POA: Diagnosis present

## 2022-05-25 DIAGNOSIS — E861 Hypovolemia: Secondary | ICD-10-CM | POA: Diagnosis present

## 2022-05-25 DIAGNOSIS — J441 Chronic obstructive pulmonary disease with (acute) exacerbation: Secondary | ICD-10-CM | POA: Diagnosis present

## 2022-05-25 DIAGNOSIS — F17210 Nicotine dependence, cigarettes, uncomplicated: Secondary | ICD-10-CM | POA: Diagnosis present

## 2022-05-25 DIAGNOSIS — C50412 Malignant neoplasm of upper-outer quadrant of left female breast: Secondary | ICD-10-CM | POA: Diagnosis present

## 2022-05-25 DIAGNOSIS — J9601 Acute respiratory failure with hypoxia: Secondary | ICD-10-CM | POA: Diagnosis not present

## 2022-05-25 DIAGNOSIS — E669 Obesity, unspecified: Secondary | ICD-10-CM | POA: Diagnosis present

## 2022-05-25 DIAGNOSIS — F1010 Alcohol abuse, uncomplicated: Secondary | ICD-10-CM | POA: Diagnosis present

## 2022-05-25 DIAGNOSIS — E785 Hyperlipidemia, unspecified: Secondary | ICD-10-CM | POA: Diagnosis present

## 2022-05-25 DIAGNOSIS — J122 Parainfluenza virus pneumonia: Secondary | ICD-10-CM | POA: Diagnosis present

## 2022-05-25 DIAGNOSIS — E876 Hypokalemia: Secondary | ICD-10-CM | POA: Diagnosis present

## 2022-05-25 DIAGNOSIS — F1911 Other psychoactive substance abuse, in remission: Secondary | ICD-10-CM | POA: Diagnosis present

## 2022-05-25 LAB — COMPREHENSIVE METABOLIC PANEL
ALT: 23 U/L (ref 0–44)
AST: 39 U/L (ref 15–41)
Albumin: 4.5 g/dL (ref 3.5–5.0)
Alkaline Phosphatase: 114 U/L (ref 38–126)
Anion gap: 11 (ref 5–15)
BUN: 24 mg/dL — ABNORMAL HIGH (ref 8–23)
CO2: 28 mmol/L (ref 22–32)
Calcium: 8.9 mg/dL (ref 8.9–10.3)
Chloride: 98 mmol/L (ref 98–111)
Creatinine, Ser: 0.99 mg/dL (ref 0.44–1.00)
GFR, Estimated: >60 mL/min (ref >60)
Glucose, Bld: 219 mg/dL — ABNORMAL HIGH (ref 70–99)
Potassium: 3.2 mmol/L — ABNORMAL LOW (ref 3.5–5.1)
Sodium: 137 mmol/L (ref 135–145)
Total Bilirubin: 1.5 mg/dL — ABNORMAL HIGH (ref 0.3–1.2)
Total Protein: 8.1 g/dL (ref 6.5–8.1)

## 2022-05-25 LAB — RESPIRATORY PANEL BY PCR

## 2022-05-25 LAB — GLUCOSE, CAPILLARY
Glucose-Capillary: 193 mg/dL — ABNORMAL HIGH (ref 70–99)
Glucose-Capillary: 241 mg/dL — ABNORMAL HIGH (ref 70–99)
Glucose-Capillary: 167 mg/dL — ABNORMAL HIGH (ref 70–99)
Glucose-Capillary: 208 mg/dL — ABNORMAL HIGH (ref 70–99)

## 2022-05-25 LAB — CBC WITH DIFFERENTIAL/PLATELET
Abs Immature Granulocytes: 0.04 10*3/uL (ref 0.00–0.07)
Basophils Absolute: 0 10*3/uL (ref 0.0–0.1)
Basophils Relative: 0 %
Eosinophils Absolute: 0 10*3/uL (ref 0.0–0.5)
Eosinophils Relative: 0 %
HCT: 34.7 % — ABNORMAL LOW (ref 36.0–46.0)
Hemoglobin: 12.6 g/dL (ref 12.0–15.0)
Immature Granulocytes: 1 %
Lymphocytes Relative: 9 %
Lymphs Abs: 0.4 10*3/uL — ABNORMAL LOW (ref 0.7–4.0)
MCH: 33.5 pg (ref 26.0–34.0)
MCHC: 36.3 g/dL — ABNORMAL HIGH (ref 30.0–36.0)
MCV: 92.3 fL (ref 80.0–100.0)
Monocytes Absolute: 0.1 10*3/uL (ref 0.1–1.0)
Monocytes Relative: 2 %
Neutro Abs: 4.3 10*3/uL (ref 1.7–7.7)
Neutrophils Relative %: 88 %
Platelets: 85 10*3/uL — ABNORMAL LOW (ref 150–400)
RBC: 3.76 MIL/uL — ABNORMAL LOW (ref 3.87–5.11)
RDW: 16.1 % — ABNORMAL HIGH (ref 11.5–15.5)
WBC Morphology: INCREASED
WBC: 4.8 10*3/uL (ref 4.0–10.5)
nRBC: 0.4 % — ABNORMAL HIGH (ref 0.0–0.2)

## 2022-05-25 LAB — STREP PNEUMONIAE URINARY ANTIGEN: Strep Pneumo Urinary Antigen: NEGATIVE

## 2022-05-25 LAB — PHOSPHORUS: Phosphorus: 5.2 mg/dL — ABNORMAL HIGH (ref 2.5–4.6)

## 2022-05-25 LAB — HIV ANTIBODY (ROUTINE TESTING W REFLEX): HIV Screen 4th Generation wRfx: NONREACTIVE

## 2022-05-25 LAB — CBG MONITORING, ED: Glucose-Capillary: 212 mg/dL — ABNORMAL HIGH (ref 70–99)

## 2022-05-25 LAB — MAGNESIUM: Magnesium: 2.2 mg/dL (ref 1.7–2.4)

## 2022-05-25 MED ORDER — GUAIFENESIN ER 600 MG PO TB12
600.0000 mg | ORAL_TABLET | Freq: Two times a day (BID) | ORAL | Status: DC
  Administered 2022-05-25 – 2022-05-27 (×5): 600 mg via ORAL
  Filled 2022-05-25 (×5): qty 1

## 2022-05-25 MED ORDER — INSULIN ASPART 100 UNIT/ML IJ SOLN
0.0000 [IU] | Freq: Three times a day (TID) | INTRAMUSCULAR | Status: DC
  Administered 2022-05-25 (×2): 3 [IU] via SUBCUTANEOUS
  Administered 2022-05-25 – 2022-05-26 (×3): 2 [IU] via SUBCUTANEOUS
  Filled 2022-05-25 (×5): qty 1

## 2022-05-25 MED ORDER — BUTALBITAL-APAP-CAFFEINE 50-325-40 MG PO TABS
1.0000 | ORAL_TABLET | Freq: Four times a day (QID) | ORAL | Status: DC | PRN
  Administered 2022-05-26: 1 via ORAL
  Filled 2022-05-25: qty 1

## 2022-05-25 MED ORDER — BENZONATATE 100 MG PO CAPS: 100.0000 mg | ORAL_CAPSULE | Freq: Three times a day (TID) | ORAL | Status: DC | PRN

## 2022-05-25 MED ORDER — MENTHOL 3 MG MT LOZG
1.0000 | LOZENGE | OROMUCOSAL | Status: DC | PRN
  Filled 2022-05-25: qty 9

## 2022-05-25 MED ORDER — FLUTICASONE FUROATE-VILANTEROL 200-25 MCG/ACT IN AEPB
1.0000 | INHALATION_SPRAY | Freq: Every day | RESPIRATORY_TRACT | Status: DC
  Administered 2022-05-25 – 2022-05-27 (×3): 1 via RESPIRATORY_TRACT
  Filled 2022-05-25: qty 28

## 2022-05-25 MED ORDER — OXYCODONE HCL 5 MG PO TABS
5.0000 mg | ORAL_TABLET | Freq: Four times a day (QID) | ORAL | Status: DC | PRN
  Administered 2022-05-25 (×2): 5 mg via ORAL
  Filled 2022-05-25 (×2): qty 1

## 2022-05-25 MED ORDER — POTASSIUM CHLORIDE CRYS ER 20 MEQ PO TBCR
40.0000 meq | EXTENDED_RELEASE_TABLET | Freq: Once | ORAL | Status: AC
  Administered 2022-05-25: 40 meq via ORAL
  Filled 2022-05-25: qty 2

## 2022-05-25 NOTE — Inpatient Diabetes Management (Signed)
Inpatient Diabetes Program Recommendations  AACE/ADA: New Consensus Statement on Inpatient Glycemic Control (2015)  Target Ranges:  Prepandial:   less than 140 mg/dL      Peak postprandial:   less than 180 mg/dL (1-2 hours)      Critically ill patients:  140 - 180 mg/dL    Latest Reference Range & Units 05/24/22 23:59 05/25/22 08:17 05/25/22 11:46  Glucose-Capillary 70 - 99 mg/dL 606 (H)  3 units Novolog  193 (H)  2 units Novolog  241 (H)  3 units Novolog   (H): Data is abnormally high  Admit Acute hypoxic respiratory failure/ COPD with acute exacerbation/ Acute encephalopathy/ Chronic HFrEF/ Cirrhosis of liver with ascites/ Alcohol abuse  History: DM2  Home DM Meds: None--Diet controlled  Current Orders: Novolog 0-9 units TID     MD- Note pt getting Prednisone 40 mg daily.  CBG 241 at Lunch.  Please consider starting Novolog Meal Coverage:  Novolog 3 units TID with meals HOLD if pt NPO HOLD if pt eats <50% meals    --Will follow patient during hospitalization--  Ambrose Finland RN, MSN, CDCES Diabetes Coordinator Inpatient Glycemic Control Team Team Pager: [phone] (8a-5p)

## 2022-05-25 NOTE — Progress Notes (Signed)
Pt stated that she did not want wear CPAP. Stated that it is available if she changes her mind.

## 2022-05-25 NOTE — Progress Notes (Signed)
[facility] Progress Note  Patient: Ariana Riggs    BPZ:[id number]  DOA: 05/24/2022     Date of Service: the patient was seen and examined on 05/25/2022  Chief Complaint  Patient presents with   Shortness of Breath   Brief hospital course: Tyree Arocha is a 62 y.o. 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  Per EMS report, patient was hypoxic in the low 80s, high 70s and febrile up to 104. She was placed on nonrebreather.  ED w/up: BP 114/60, HR 95. O2 sat 95% on 5 L oxygen. Febrile at 102.5.   WBC 7.0, hemoglobin 12.8, platelets 107, sodium 134, bicarb 27, glucose 164, creatinine 0.96, AST 43, ALT 21, total bilirubin 2.8 and GFR above 60.  BNP elevated 289. Troponin elevated at 31 with flat trend at 30. Lactic acid within normal limits x 2. COVID-19, RSV and influenza PCR negative.  Urinalysis with moderate hematuria, but no other findings.  CXR  negative for any acute opacities. Patient started on azithromycin, Rocephin and DuoNebs. [facility] contacted for admission.   Assessment and Plan:  # Acute hypoxic respiratory failure secondary to parainfluenza type III viral infection and COPD exacerbation Continue supplemental O2 admission and gradually wean off Patient uses Anoro Ellipta at home Started Breo Ellipta inhaler Started prednisone 40 mg p.o. daily for 5 days Continue empiric ceftriaxone azithromycin Continue DuoNeb every 6 hourly Mucinex 600 mg p.o. twice daily  Lozenges as needed cough   Hypokalemia, potassium repleted.  Acute encephalopathy and delirium most likely due to hypoxia History of liver cirrhosis with ascites secondary  to EtOH use Suspected hepatic encephalopathy on arrival, patient was started on lactulose once daily  Home dose rifaximin Check ammonia level  QT prolongation QTc on EKG obtained today elevated at 469.  Will monitor closely while receiving QT prolonging agents   - Telemetry monitoring   Alcohol abuse Per chart review, patient denies any recent alcohol use.  Unable to confirm if any recent use from patient's husband   - CIWA monitoring without Ativan - Will continue home scheduled Ativan   Persistent atrial fibrillation (HCC) - Continue home Eliquis and metoprolol   Malignant neoplasm of upper-outer quadrant of left breast in female, estrogen receptor positive - Continue home anastrozole   OSA on CPAP - CPAP ordered for bedtime   Depression with anxiety - Continue home BuSpar, citalopram, Ativan   Type 2 diabetes mellitus with other specified complication Currently diet controlled only.    Body mass index is 29.7 kg/m.  Interventions:       Diet: Heart healthy diet DVT Prophylaxis: Therapeutic Anticoagulation with Eliquis    Advance goals of care discussion: Full code  Family Communication: family was present at bedside, at the time of interview.  The pt provided permission to discuss medical plan with the family. Opportunity was given to ask question and all questions were answered satisfactorily.   Disposition:  Pt is from Home, admitted with Resp Failure due to parainfluenza viral infection, still has respiratory failure, which precludes a safe discharge. Discharge to Home, when clinically stable, may need few more days to improve.  Subjective: No significant events overnight, patient still very short of breath, having productive cough.  Denies any  chest pain or palpitations.  Physical Exam: General: NAD, lying comfortably, mild SOB Appear in no distress, affect appropriate Eyes: PERRLA ENT: Oral Mucosa Clear, moist  Neck: no JVD,  Cardiovascular: S1 and S2  Present, no Murmur,  Respiratory: Air entry bilaterally, bilateral wheezing and crackles audible. Abdomen: Bowel Sound present, Soft and no tenderness,  Skin: no rashes Extremities: no Pedal edema, no calf tenderness Neurologic: without any new focal findings Gait not checked due to patient safety concerns  Vitals:   05/25/22 0054 05/25/22 0719 05/25/22 0830 05/25/22 1448  BP: 118/68  121/60 129/65  Pulse: 84  92 85  Resp: 20  19 18   Temp: 97.7 F (36.5 C)  (!) 97.5 F (36.4 C) 97.9 F (36.6 C)  TempSrc:   Oral   SpO2: 95% 96% 100% 97%  Weight:      Height:        Intake/Output Summary (Last 24 hours) at 05/25/2022 1455 Last data filed at 05/25/2022 0301 Gross per 24 hour  Intake 258.37 ml  Output --  Net 258.37 ml   Filed Weights   05/24/22 2005  Weight: 83.5 kg    Data Reviewed: I have personally reviewed and interpreted daily labs, tele strips, imagings as discussed above. I reviewed all nursing notes, pharmacy notes, vitals, pertinent old records I have discussed plan of care as described above with RN and patient/family.  CBC: Recent Labs  Lab 05/24/22 2001 05/25/22 0428  WBC 7.0 4.8  NEUTROABS 6.3 4.3  HGB 12.8 12.6  HCT 36.0 34.7*  MCV 93.8 92.3  PLT 107* 85*   Basic Metabolic Panel: Recent Labs  Lab 05/24/22 2001 05/25/22 0407 05/25/22 0428  NA 134*  --  137  K 3.5  --  3.2*  CL 95*  --  98  CO2 27  --  28  GLUCOSE 164*  --  219*  BUN 23  --  24*  CREATININE 0.96  --  0.99  CALCIUM 8.8*  --  8.9  MG  --  2.2  --   PHOS  --  5.2*  --     Studies: CT CHEST WO CONTRAST  Result Date: 05/25/2022 CLINICAL DATA:  Respiratory illness, nondiagnostic x-ray EXAM: CT CHEST WITHOUT CONTRAST TECHNIQUE: Multidetector CT imaging of the chest was performed following the standard protocol without IV contrast. RADIATION DOSE REDUCTION: This exam was performed according to the departmental dose-optimization program which includes automated exposure control,  adjustment of the mA and/or kV according to patient size and/or use of iterative reconstruction technique. COMPARISON:  Radiograph 05/24/2022 and CT chest 07/11/2018 FINDINGS: Cardiovascular: Cardiomegaly. No pericardial effusion. Sternotomy and CABG. Coronary artery and aortic atherosclerotic calcification. The main pulmonary artery is dilated measuring 34 mm in diameter. Mediastinum/Nodes: Normal thyroid and esophagus. Unchanged borderline mediastinal adenopathy. For example a low right paratracheal node measures 11 mm (2/48). The hilar regions are poorly evaluated without IV contrast. Lungs/Pleura: Respiratory motion obscures detail. Centrilobular and paraseptal emphysema. Diffuse bronchial wall thickening and mucous plugging greatest in the lower lobes. Peribronchovascular atelectasis/consolidation in the bilateral lower lungs. No pleural effusion or pneumothorax. Upper Abdomen: Unchanged splenomegaly.  No acute abnormality. Musculoskeletal: No chest wall mass or suspicious bone lesions identified. IMPRESSION: 1. Diffuse bronchial wall thickening and mucous plugging greatest in the lower lobes. Associated peribronchovascular atelectasis/consolidation in the bilateral lower lungs. Findings are most consistent with infection and/or aspiration. 2. Cardiomegaly with coronary artery atherosclerosis. 3. Dilated main pulmonary artery, suggesting pulmonary arterial hypertension. 4. Emphysema. Aortic Atherosclerosis (  ICD10-I70.0) and Emphysema (ICD10-J43.9). Electronically Signed   By: Minerva Fester M.D.   On: 05/25/2022 00:56   DG Chest Port 1 View  Result Date: 05/24/2022 CLINICAL DATA:  SOB EXAM: PORTABLE CHEST 1 VIEW COMPARISON:  04/19/2021 FINDINGS: Cardiac silhouette appears prominent. No pneumothorax or pleural effusion. There is mild pulmonary vascular congestion. There are thoracic degenerative changes. IMPRESSION: Enlarged cardiac silhouette.  Mild pulmonary vascular congestion. Electronically Signed   By:  Layla Maw M.D.   On: 05/24/2022 20:26    Scheduled Meds:  anastrozole  1 mg Oral Daily   apixaban  5 mg Oral BID   atorvastatin  10 mg Oral Daily   busPIRone  10 mg Oral BID   citalopram  20 mg Oral Daily   fluticasone furoate-vilanterol  1 puff Inhalation Daily   folic acid  1 mg Oral Daily   guaiFENesin  600 mg Oral BID   insulin aspart  0-9 Units Subcutaneous TID WC   ipratropium-albuterol  3 mL Nebulization Q6H   lactulose  20 g Oral Daily   LORazepam  1 mg Oral BID   losartan  12.5 mg Oral Daily   metoprolol succinate  50 mg Oral BID   predniSONE  40 mg Oral Q breakfast   rifaximin  550 mg Oral BID   sodium chloride flush  3 mL Intravenous Q12H   thiamine  100 mg Oral Daily   Continuous Infusions:  azithromycin Stopped (05/24/22 2221)   cefTRIAXone (ROCEPHIN)  IV Stopped (05/24/22 2054)   PRN Meds: acetaminophen **OR** acetaminophen, benzonatate, butalbital-acetaminophen-caffeine, menthol-cetylpyridinium, ondansetron **OR** ondansetron (ZOFRAN) IV, oxyCODONE  Time spent: 35 minutes  Author: Gillis Santa. MD [facility] 05/25/2022 2:55 PM  To reach On-call, see care teams to locate the attending and reach out to them via [email or web address]. If 7PM-7AM, please contact night-coverage If you still have difficulty reaching the attending provider, please page the [facility] (Director on Call) for [facility] on amion for assistance.

## 2022-05-25 NOTE — TOC Progression Note (Signed)
Transition of Care [facility]) - Progression Note    Patient Details  Name: Ariana Riggs MRN: [id number] Date of Birth: 05-Oct-1960  Transition of Care [facility]) CM/SW Contact  Marlowe Sax, RN Phone Number: 05/25/2022, 3:31 PM  Clinical Narrative:    The patient has been seen recently with their oncologist office Has also been seen in the outpatient clinic recently  [facility] following for needs and DC planning   Expected Discharge Plan: Home/Self Care Barriers to Discharge: Continued Medical Work up  Expected Discharge Plan and Services   Discharge Planning Services: CM Consult   Living arrangements for the past 2 months: Single Family Home                                       Social Determinants of Health (SDOH) Interventions SDOH Screenings   Food Insecurity: No Food Insecurity (05/25/2022)  Housing: Low Risk  (05/25/2022)  Transportation Needs: No Transportation Needs (05/25/2022)  Utilities: Not At Risk (05/25/2022)  Tobacco Use: High Risk (05/25/2022)    Readmission Risk Interventions     No data to display

## 2022-05-26 DIAGNOSIS — J9601 Acute respiratory failure with hypoxia: Secondary | ICD-10-CM | POA: Diagnosis not present

## 2022-05-26 LAB — LEGIONELLA PNEUMOPHILA SEROGP 1 UR AG: L. pneumophila Serogp 1 Ur Ag: NEGATIVE

## 2022-05-26 LAB — AMMONIA: Ammonia: 25 umol/L (ref 9–35)

## 2022-05-26 LAB — CBC
HCT: 32.9 % — ABNORMAL LOW (ref 36.0–46.0)
Hemoglobin: 11.7 g/dL — ABNORMAL LOW (ref 12.0–15.0)
MCH: 33.4 pg (ref 26.0–34.0)
MCHC: 35.6 g/dL (ref 30.0–36.0)
MCV: 94 fL (ref 80.0–100.0)
Platelets: 80 10*3/uL — ABNORMAL LOW (ref 150–400)
RBC: 3.5 MIL/uL — ABNORMAL LOW (ref 3.87–5.11)
RDW: 16.5 % — ABNORMAL HIGH (ref 11.5–15.5)
WBC: 9 10*3/uL (ref 4.0–10.5)
nRBC: 0 % (ref 0.0–0.2)

## 2022-05-26 LAB — MAGNESIUM: Magnesium: 2.3 mg/dL (ref 1.7–2.4)

## 2022-05-26 LAB — GLUCOSE, CAPILLARY
Glucose-Capillary: 101 mg/dL — ABNORMAL HIGH (ref 70–99)
Glucose-Capillary: 168 mg/dL — ABNORMAL HIGH (ref 70–99)
Glucose-Capillary: 159 mg/dL — ABNORMAL HIGH (ref 70–99)
Glucose-Capillary: 113 mg/dL — ABNORMAL HIGH (ref 70–99)

## 2022-05-26 LAB — PHOSPHORUS: Phosphorus: 3.5 mg/dL (ref 2.5–4.6)

## 2022-05-26 LAB — BASIC METABOLIC PANEL
Anion gap: 9 (ref 5–15)
BUN: 22 mg/dL (ref 8–23)
CO2: 27 mmol/L (ref 22–32)
Calcium: 9 mg/dL (ref 8.9–10.3)
Chloride: 99 mmol/L (ref 98–111)
Creatinine, Ser: 0.74 mg/dL (ref 0.44–1.00)
GFR, Estimated: >60 mL/min (ref >60)
Glucose, Bld: 109 mg/dL — ABNORMAL HIGH (ref 70–99)
Potassium: 4.4 mmol/L (ref 3.5–5.1)
Sodium: 135 mmol/L (ref 135–145)

## 2022-05-26 LAB — HEMOGLOBIN A1C
Hgb A1c MFr Bld: 5.2 % (ref 4.8–5.6)
Mean Plasma Glucose: 103 mg/dL

## 2022-05-26 MED ORDER — RAMELTEON 8 MG PO TABS
8.0000 mg | ORAL_TABLET | Freq: Every day | ORAL | Status: DC
  Administered 2022-05-26: 8 mg via ORAL
  Filled 2022-05-26: qty 1

## 2022-05-26 MED ORDER — AZITHROMYCIN 500 MG PO TABS
500.0000 mg | ORAL_TABLET | Freq: Every day | ORAL | Status: DC
  Administered 2022-05-26 – 2022-05-27 (×2): 500 mg via ORAL
  Filled 2022-05-26 (×2): qty 1

## 2022-05-26 MED ORDER — ALBUTEROL SULFATE (2.5 MG/3ML) 0.083% IN NEBU: 2.5000 mg | INHALATION_SOLUTION | RESPIRATORY_TRACT | Status: DC | PRN

## 2022-05-26 MED ORDER — IPRATROPIUM-ALBUTEROL 0.5-2.5 (3) MG/3ML IN SOLN
3.0000 mL | Freq: Three times a day (TID) | RESPIRATORY_TRACT | Status: DC
  Administered 2022-05-26 – 2022-05-27 (×4): 3 mL via RESPIRATORY_TRACT
  Filled 2022-05-26 (×4): qty 3

## 2022-05-26 MED ORDER — TRAZODONE HCL 50 MG PO TABS
50.0000 mg | ORAL_TABLET | Freq: Every day | ORAL | Status: DC
  Administered 2022-05-26: 50 mg via ORAL
  Filled 2022-05-26: qty 1

## 2022-05-26 NOTE — Progress Notes (Signed)
PHARMACIST - PHYSICIAN COMMUNICATION DR:   Kumar CONCERNING: Antibiotic IV to Oral Route Change Policy  RECOMMENDATION: This patient is receiving azithromycin by the intravenous route.  Based on criteria approved by the Pharmacy and Therapeutics Committee, the antibiotic(s) is/are being converted to the equivalent oral dose form(s).   DESCRIPTION: These criteria include: Patient being treated for a respiratory tract infection, urinary tract infection, cellulitis or clostridium difficile associated diarrhea if on metronidazole The patient is not neutropenic and does not exhibit a GI malabsorption state The patient is eating (either orally or via tube) and/or has been taking other orally administered medications for a least 24 hours The patient is improving clinically and has a Tmax < 100.5  If you have questions about this conversion, please contact the Pharmacy Department  []  ( [phone] )  [place] [x]  ( [phone] )  [facility] []  ( [phone]  [place] []  ( [phone]  [facility] []  ( [phone] )   [facility]   

## 2022-05-26 NOTE — Progress Notes (Signed)
[facility] Progress Note  Patient: Ariana Riggs    ZCH:[id number]  DOA: 05/24/2022     Date of Service: the patient was seen and examined on 05/26/2022  Chief Complaint  Patient presents with   Shortness of Breath   Brief hospital course: Ariana Riggs is a 62 y.o. 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  Per EMS report, patient was hypoxic in the low 80s, high 70s and febrile up to 104. She was placed on nonrebreather.  ED w/up: BP 114/60, HR 95. O2 sat 95% on 5 L oxygen. Febrile at 102.5.   WBC 7.0, hemoglobin 12.8, platelets 107, sodium 134, bicarb 27, glucose 164, creatinine 0.96, AST 43, ALT 21, total bilirubin 2.8 and GFR above 60.  BNP elevated 289. Troponin elevated at 31 with flat trend at 30. Lactic acid within normal limits x 2. COVID-19, RSV and influenza PCR negative.  Urinalysis with moderate hematuria, but no other findings.  CXR  negative for any acute opacities. Patient started on azithromycin, Rocephin and DuoNebs. TR[facility] contacted for admission.   Assessment and Plan:  # Acute hypoxic respiratory failure secondary to parainfluenza type III viral infection and COPD exacerbation Continue supplemental O2 admission and gradually wean off Patient uses Anoro Ellipta at home Continue Breo Ellipta inhaler Continue prednisone 40 mg p.o. daily for 5 days S/p ceftriaxone d/c'd on 4/10, continue azithromycin for bronchitis Continue DuoNeb every 6 hourly Mucinex 600 mg p.o. twice daily  Lozenges as needed cough   Hypokalemia, potassium repleted.  Resolved  Acute encephalopathy and delirium most likely due to hypoxia History of  liver cirrhosis with ascites secondary to EtOH use Suspected hepatic encephalopathy on arrival, patient was started on lactulose once daily  Home dose rifaximin Check ammonia level  QT prolongation QTc on EKG obtained today elevated at 469.  Will monitor closely while receiving QT prolonging agents  - Telemetry monitoring   Alcohol abuse Per chart review, patient denies any recent alcohol use.  Unable to confirm if any recent use from patient's husband   - CIWA monitoring without Ativan - Will continue home scheduled Ativan   Persistent atrial fibrillation (HCC) - Continue home Eliquis and metoprolol   Malignant neoplasm of upper-outer quadrant of left breast in female, estrogen receptor positive - Continue home anastrozole   OSA on CPAP - CPAP ordered for bedtime   Depression with anxiety - Continue home BuSpar, citalopram, Ativan   Type 2 diabetes mellitus with other specified complication Currently diet controlled only.    Body mass index is 29.7 kg/m.  Interventions:       Diet: Heart healthy diet DVT Prophylaxis: Therapeutic Anticoagulation with Eliquis    Advance goals of care discussion: Full code  Family Communication: family was present at bedside, at the time of interview.  The pt provided permission to discuss medical plan with the family. Opportunity was given to ask question and all questions were answered satisfactorily.   Disposition:  Pt is from Home, admitted with Resp Failure due to parainfluenza viral infection, still has respiratory failure, which precludes a safe discharge. Discharge to Home, when clinically stable, may need few more days to improve.  Subjective: No significant events overnight, patient still having shortness of breath,  cough and phlegm production.  Overall she feels improvement.  Patient is able to ambulate in the hallway with supplemental O2 inhalation. We will continue both treatment and eval day   Physical Exam: General:  NAD, lying comfortably, mild SOB Appear in no distress, affect appropriate Eyes: PERRLA ENT: Oral Mucosa Clear, moist  Neck: no JVD,  Cardiovascular: S1 and S2 Present, no Murmur,  Respiratory: Equal air entry bilaterally, bilateral wheezing and crackles audible.  Improved as compared to yesterday Abdomen: Bowel Sound present, Soft and no tenderness,  Skin: no rashes Extremities: no Pedal edema, no calf tenderness Neurologic: without any new focal findings Gait not checked due to patient safety concerns  Vitals:   05/25/22 2347 05/26/22 0759 05/26/22 0801 05/26/22 1339  BP: 127/68 131/72    Pulse: 75 66 65   Resp: 20 18 18    Temp: 98.2 F (36.8 C) 97.7 F (36.5 C)    TempSrc:      SpO2: 99% 96% 95% 94%  Weight:      Height:        Intake/Output Summary (Last 24 hours) at 05/26/2022 1408 Last data filed at 05/26/2022 0600 Gross per 24 hour  Intake 830 ml  Output --  Net 830 ml   Filed Weights   05/24/22 2005  Weight: 83.5 kg    Data Reviewed: I have personally reviewed and interpreted daily labs, tele strips, imagings as discussed above. I reviewed all nursing notes, pharmacy notes, vitals, pertinent old records I have discussed plan of care as described above with RN and patient/family.  CBC: Recent Labs  Lab 05/24/22 2001 05/25/22 0428 05/26/22 0539  WBC 7.0 4.8 9.0  NEUTROABS 6.3 4.3  --   HGB 12.8 12.6 11.7*  HCT 36.0 34.7* 32.9*  MCV 93.8 92.3 94.0  PLT 107* 85* 80*   Basic Metabolic Panel: Recent Labs  Lab 05/24/22 2001 05/25/22 0407 05/25/22 0428 05/26/22 0539  NA 134*  --  137 135  K 3.5  --  3.2* 4.4  CL 95*  --  98 99  CO2 27  --  28 27  GLUCOSE 164*  --  219* 109*  BUN 23  --  24* 22  CREATININE 0.96  --  0.99 0.74  CALCIUM 8.8*  --  8.9 9.0  MG  --  2.2  --  2.3  PHOS  --  5.2*  --  3.5    Studies: No results found.  Scheduled Meds:  anastrozole  1 mg Oral Daily   apixaban  5 mg Oral BID   atorvastatin  10 mg Oral Daily    azithromycin  500 mg Oral Daily   busPIRone  10 mg Oral BID   citalopram  20 mg Oral Daily   fluticasone furoate-vilanterol  1 puff Inhalation Daily   folic acid  1 mg Oral Daily   guaiFENesin  600 mg Oral BID   insulin aspart  0-9 Units Subcutaneous TID WC   ipratropium-albuterol  3 mL Nebulization TID   LORazepam  1 mg Oral BID   losartan  12.5 mg Oral Daily   metoprolol succinate  50 mg Oral BID   predniSONE  40 mg Oral Q breakfast   sodium chloride flush  3 mL Intravenous Q12H   thiamine  100 mg Oral Daily   Continuous Infusions:   PRN Meds: acetaminophen **OR** acetaminophen, albuterol, benzonatate, butalbital-acetaminophen-caffeine, menthol-cetylpyridinium, ondansetron **OR** ondansetron (ZOFRAN) IV, oxyCODONE  Time spent: 35 minutes  Author: Gillis SantaILEEP Kassie Keng. MD [facility] 05/26/2022 2:08 PM  To reach On-call, see care teams to locate the attending and reach out to them via [email or web address]. If 7PM-7AM, please contact night-coverage If you still have difficulty reaching the attending provider, please page the [facility] (Director on Call) for [facility] on amion for assistance.

## 2022-05-26 NOTE — Progress Notes (Signed)
Pt stated she did not wear CPAP

## 2022-05-26 NOTE — Plan of Care (Signed)
  Problem: Education: Goal: Knowledge of disease or condition will improve Outcome: Progressing Goal: Knowledge of the prescribed therapeutic regimen will improve Outcome: Progressing Goal: Individualized Educational Video(s) Outcome: Progressing   Problem: Activity: Goal: Ability to tolerate increased activity will improve Outcome: Progressing Goal: Will verbalize the importance of balancing activity with adequate rest periods Outcome: Progressing   Problem: Respiratory: Goal: Ability to maintain a clear airway will improve Outcome: Progressing Goal: Levels of oxygenation will improve Outcome: Progressing Goal: Ability to maintain adequate ventilation will improve Outcome: Progressing   Problem: Activity: Goal: Ability to tolerate increased activity will improve Outcome: Progressing   Problem: Clinical Measurements: Goal: Ability to maintain a body temperature in the normal range will improve Outcome: Progressing   Problem: Respiratory: Goal: Ability to maintain adequate ventilation will improve Outcome: Progressing Goal: Ability to maintain a clear airway will improve Outcome: Progressing   Problem: Education: Goal: Ability to describe self-care measures that may prevent or decrease complications (Diabetes Survival Skills Education) will improve Outcome: Progressing Goal: Individualized Educational Video(s) Outcome: Progressing   Problem: Coping: Goal: Ability to adjust to condition or change in health will improve Outcome: Progressing   Problem: Fluid Volume: Goal: Ability to maintain a balanced intake and output will improve Outcome: Progressing   Problem: Health Behavior/Discharge Planning: Goal: Ability to identify and utilize available resources and services will improve Outcome: Progressing Goal: Ability to manage health-related needs will improve Outcome: Progressing   Problem: Metabolic: Goal: Ability to maintain appropriate glucose levels will  improve Outcome: Progressing   Problem: Nutritional: Goal: Maintenance of adequate nutrition will improve Outcome: Progressing Goal: Progress toward achieving an optimal weight will improve Outcome: Progressing   Problem: Skin Integrity: Goal: Risk for impaired skin integrity will decrease Outcome: Progressing   Problem: Tissue Perfusion: Goal: Adequacy of tissue perfusion will improve Outcome: Progressing   Problem: Education: Goal: Knowledge of General Education information will improve Description: Including pain rating scale, medication(s)/side effects and non-pharmacologic comfort measures Outcome: Progressing   Problem: Health Behavior/Discharge Planning: Goal: Ability to manage health-related needs will improve Outcome: Progressing   Problem: Clinical Measurements: Goal: Ability to maintain clinical measurements within normal limits will improve Outcome: Progressing Goal: Will remain free from infection Outcome: Progressing Goal: Diagnostic test results will improve Outcome: Progressing Goal: Respiratory complications will improve Outcome: Progressing Goal: Cardiovascular complication will be avoided Outcome: Progressing   Problem: Activity: Goal: Risk for activity intolerance will decrease Outcome: Progressing   Problem: Nutrition: Goal: Adequate nutrition will be maintained Outcome: Progressing   Problem: Coping: Goal: Level of anxiety will decrease Outcome: Progressing   Problem: Elimination: Goal: Will not experience complications related to bowel motility Outcome: Progressing Goal: Will not experience complications related to urinary retention Outcome: Progressing   Problem: Pain Managment: Goal: General experience of comfort will improve Outcome: Progressing   Problem: Safety: Goal: Ability to remain free from injury will improve Outcome: Progressing   Problem: Skin Integrity: Goal: Risk for impaired skin integrity will decrease Outcome:  Progressing   

## 2022-05-27 DIAGNOSIS — J9601 Acute respiratory failure with hypoxia: Secondary | ICD-10-CM | POA: Diagnosis not present

## 2022-05-27 LAB — GLUCOSE, CAPILLARY: Glucose-Capillary: 96 mg/dL (ref 70–99)

## 2022-05-27 LAB — BASIC METABOLIC PANEL
Anion gap: 9 (ref 5–15)
BUN: 22 mg/dL (ref 8–23)
CO2: 28 mmol/L (ref 22–32)
Calcium: 9.1 mg/dL (ref 8.9–10.3)
Chloride: 104 mmol/L (ref 98–111)
Creatinine, Ser: 0.75 mg/dL (ref 0.44–1.00)
GFR, Estimated: >60 mL/min (ref >60)
Glucose, Bld: 94 mg/dL (ref 70–99)
Potassium: 4 mmol/L (ref 3.5–5.1)
Sodium: 141 mmol/L (ref 135–145)

## 2022-05-27 LAB — CBC
HCT: 30.4 % — ABNORMAL LOW (ref 36.0–46.0)
Hemoglobin: 10.6 g/dL — ABNORMAL LOW (ref 12.0–15.0)
MCH: 32.9 pg (ref 26.0–34.0)
MCHC: 34.9 g/dL (ref 30.0–36.0)
MCV: 94.4 fL (ref 80.0–100.0)
Platelets: 100 10*3/uL — ABNORMAL LOW (ref 150–400)
RBC: 3.22 MIL/uL — ABNORMAL LOW (ref 3.87–5.11)
RDW: 16.4 % — ABNORMAL HIGH (ref 11.5–15.5)
WBC: 7 10*3/uL (ref 4.0–10.5)
nRBC: 0 % (ref 0.0–0.2)

## 2022-05-27 LAB — PHOSPHORUS: Phosphorus: 3.4 mg/dL (ref 2.5–4.6)

## 2022-05-27 LAB — MAGNESIUM: Magnesium: 2.4 mg/dL (ref 1.7–2.4)

## 2022-05-27 MED ORDER — PREDNISONE 20 MG PO TABS: ORAL_TABLET | ORAL | 0 refills | Status: AC

## 2022-05-27 MED ORDER — GUAIFENESIN ER 600 MG PO TB12: 600.0000 mg | ORAL_TABLET | Freq: Two times a day (BID) | ORAL | 0 refills | Status: AC

## 2022-05-27 MED ORDER — BENZONATATE 100 MG PO CAPS: 100.0000 mg | ORAL_CAPSULE | Freq: Three times a day (TID) | ORAL | 0 refills | Status: DC | PRN

## 2022-05-27 MED ORDER — UMECLIDINIUM-VILANTEROL 62.5-25 MCG/INH IN AEPB: 1.0000 | INHALATION_SPRAY | Freq: Every day | RESPIRATORY_TRACT | 2 refills | Status: AC

## 2022-05-27 MED ORDER — AZITHROMYCIN 500 MG PO TABS: 500.0000 mg | ORAL_TABLET | Freq: Every day | ORAL | 0 refills | Status: AC

## 2022-05-27 NOTE — Discharge Summary (Signed)
[facility] Discharge Summary   Patient: Ariana Riggs WUG:[id number]  PCP: Luciana Axe, NP  Date of admission: 05/24/2022   Date of discharge:  05/27/2022     Discharge Diagnoses:  Principal Problem:   Acute hypoxic respiratory failure Active Problems:   COPD with acute exacerbation   Acute encephalopathy   Chronic HFrEF (heart failure with reduced ejection fraction)   Cirrhosis of liver with ascites   QT prolongation   Alcohol abuse   Persistent atrial fibrillation (HCC)   Malignant neoplasm of upper-outer quadrant of left breast in female, estrogen receptor positive   OSA on CPAP   Depression with anxiety   Type 2 diabetes mellitus with other specified complication   Admitted From: Home Disposition:  Home   Recommendations for Outpatient Follow-up:  Follow-up with PCP in 1 week, repeat chest x-ray after 4 weeks for resolution of pneumonia.  Follow up LABS/TEST:  CXR in 4 wks   Diet recommendation: Cardiac and Carb modified diet  Activity: The patient is advised to gradually reintroduce usual activities, as tolerated  Discharge Condition: stable  Code Status: Full code   History of present illness: As per the H and P dictated on admission  Hospital Course:  Ariana Riggs is a 62 y.o. 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  Per EMS report, patient was hypoxic in the low 80s, high 70s and febrile up to 104. She was placed on nonrebreather.  ED w/up: BP 114/60, HR 95. O2 sat 95% on 5 L oxygen. Febrile at 102.5.   WBC 7.0, hemoglobin 12.8, platelets 107, sodium 134, bicarb 27, glucose 164, creatinine 0.96, AST 43,  ALT 21, total bilirubin 2.8 and GFR above 60.  BNP elevated 289. Troponin elevated at 31 with flat trend at 30. Lactic acid within normal limits x 2. COVID-19, RSV and influenza PCR negative.  Urinalysis with moderate hematuria, but no other findings.  CXR  negative for any acute opacities. Patient started on azithromycin, Rocephin and DuoNebs. [facility] contacted for admission.    Assessment and Plan: # Acute hypoxic respiratory failure secondary to parainfluenza type III viral infection and COPD exacerbation.  Respiratory failure resolved, currently patient is saturating well on room air, supplemental O2 inhalation has been weaned off. S/p Breo Ellipta inhaler, resumed Anoro Ellipta home inhaler and continue to use DuoNeb and albuterol as needed at home. S/p prednisone 40 mg p.o. daily, started tapering dose on discharge. S/p ceftriaxone d/c'd on 4/10, continue azithromycin for bronchitis, prescribed azithromycin 500 mg p.o. daily for 3 days.  Continue Mucinex 600 mg po twice daily for 7 days, and continue lozenges as needed.  Follow-up with PCP to repeat chest x-ray after 4 weeks. # Hypokalemia, potassium repleted.  Resolved # Acute encephalopathy and delirium most likely due to hypoxia History of liver cirrhosis with ascites secondary to EtOH use. Suspected hepatic encephalopathy on arrival which has been ruled out.  Ammonia level within normal range.    # QT prolongation: QTc on EKG 469.patient was monitored on telemetry, no significant events noticed.   # Alcohol abuse: Per chart review, patient denies any recent alcohol use.  S/p CIWA protocol, no withdrawal symptoms during hospital stay. # Persistent atrial fibrillation: Continue home Eliquis  and metoprolol # Malignant neoplasm of upper-outer quadrant of left breast in female, estrogen receptor positive: Continue home anastrozole # OSA on CPAP: CPAP ordered for bedtime # Depression with anxiety: Continue home BuSpar, citalopram, Ativan # Type 2  diabetes mellitus with other specified complication. Currently diet controlled only. # Liver cirrhosis, continued home medications.  Body mass index is 29.7 kg/m.  Nutrition Interventions:   - Patient was instructed, not to drive, operate heavy machinery, perform activities at heights, swimming or participation in water activities or provide baby sitting services while on Pain, Sleep and Anxiety Medications; until her outpatient Physician has advised to do so again.  - Also recommended to not to take more than prescribed Pain, Sleep and Anxiety Medications.  Patient was ambulatory without any assistance. On the day of the discharge the patient's vitals were stable, and no other acute medical condition were reported by patient. the patient was felt safe to be discharge at Home.  Consultants: None Procedures: None  Discharge Exam: General: Appear in no distress, no Rash; Oral Mucosa Clear, moist. Cardiovascular: S1 and S2 Present, no Murmur, Respiratory: normal respiratory effort, Bilateral Air entry present and no Crackles, no wheezes Abdomen: Bowel Sound present, Soft and no tenderness, no hernia Extremities: no Pedal edema, no calf tenderness Neurology: alert and oriented to time, place, and person affect appropriate.  Filed Weights   05/24/22 2005  Weight: 83.5 kg   Vitals:   05/26/22 2157 05/27/22 0904  BP:  (!) 117/53  Pulse:  63  Resp:  18  Temp:  98.3 F (36.8 C)  SpO2: 93% 94%    DISCHARGE MEDICATION: Allergies as of 05/27/2022       Reactions   Melatonin Hives   Doxycycline Nausea And Vomiting   Amiodarone    hyperthyroidism   Erythromycin Nausea And Vomiting   Paxil [paroxetine Hcl] Hives   Penicillins Hives   Sulfa Antibiotics Hives        Medication List     TAKE these medications    albuterol 108 (90 Base) MCG/ACT inhaler Commonly known as: VENTOLIN HFA Inhale 2 puffs into the lungs every 6 (six) hours as needed for wheezing or shortness of  breath.   anastrozole 1 MG tablet Commonly known as: ARIMIDEX Take 1 tablet by mouth daily.   apixaban 5 MG Tabs tablet Commonly known as: Eliquis Take 1 tablet (5 mg total) by mouth 2 (two) times daily.   atorvastatin 10 MG tablet Commonly known as: LIPITOR TAKE 1 TABLET BY MOUTH EVERY DAY   azithromycin 500 MG tablet Commonly known as: ZITHROMAX Take 1 tablet (500 mg total) by mouth daily for 3 days. Start taking on: May 28, 2022 What changed:  medication strength how much to take   benzonatate 100 MG capsule Commonly known as: TESSALON Take 1 capsule (100 mg total) by mouth 3 (three) times daily as needed for cough.   busPIRone 10 MG tablet Commonly known as: BUSPAR Take 1 tablet by mouth in the morning and at bedtime.   citalopram 20 MG tablet Commonly known as: CELEXA Take 20 mg by mouth daily.   Clear Eyes for Dry Eyes 1-0.25 % Soln Generic drug: Carboxymethylcellul-Glycerin Place 1 drop into both eyes daily.   fluticasone 50 MCG/ACT nasal spray Commonly known as: FLONASE Place 2 sprays into both nostrils daily as needed for allergies.   furosemide 40 MG tablet Commonly known as: LASIX Take 2 tablets (80 mg total) by mouth 2 (two) times daily.  guaiFENesin 600 MG 12 hr tablet Commonly known as: MUCINEX Take 1 tablet (600 mg total) by mouth 2 (two) times daily for 7 days.   HYDROcodone bit-homatropine 5-1.5 MG/5ML syrup Commonly known as: HYCODAN Take 5 mLs by mouth every 6 (six) hours as needed.   ipratropium-albuterol 0.5-2.5 (3) MG/3ML Soln Commonly known as: DUONEB Inhale 3 mLs into the lungs every 6 (six) hours as needed.   lactulose 10 GM/15ML solution Commonly known as: CHRONULAC Take 20 g by mouth daily as needed (hepatic).   lansoprazole 30 MG capsule Commonly known as: PREVACID Take 30 mg by mouth daily at 12 noon.   LORazepam 1 MG tablet Commonly known as: ATIVAN Take 1 mg by mouth 2 (two) times daily.   losartan 25 MG  tablet Commonly known as: COZAAR Take 0.5 tablets (12.5 mg total) by mouth daily.   metoprolol succinate 50 MG 24 hr tablet Commonly known as: TOPROL-XL Take 1 tablet (50 mg total) by mouth in the morning and at bedtime. Take with or immediately following a meal.   nitroGLYCERIN 0.4 MG SL tablet Commonly known as: NITROSTAT Place 0.4 mg under the tongue every 5 (five) minutes as needed for chest pain.   ondansetron 4 MG disintegrating tablet Commonly known as: ZOFRAN-ODT Take 4 mg by mouth every 8 (eight) hours as needed.   OneTouch Delica Plus Lancet33G Misc ONCE DAILY CHECK SUGARS   potassium chloride SA 20 MEQ tablet Commonly known as: KLOR-CON M Take 2 tablets (40 mEq total) by mouth daily.   predniSONE 20 MG tablet Commonly known as: DELTASONE Take 2 tablets (40 mg total) by mouth daily with breakfast for 3 days, THEN 1.5 tablets (30 mg total) daily with breakfast for 3 days, THEN 1 tablet (20 mg total) daily with breakfast for 3 days, THEN 0.5 tablets (10 mg total) daily with breakfast for 3 days. Start taking on: May 27, 2022 What changed:  medication strength See the new instructions.   spironolactone 50 MG tablet Commonly known as: ALDACTONE Take 1 tablet (50 mg total) by mouth daily.   traZODone 50 MG tablet Commonly known as: DESYREL Take 50 mg by mouth at bedtime.   umeclidinium-vilanterol 62.5-25 MCG/INH Aepb Commonly known as: ANORO ELLIPTA Inhale 1 puff into the lungs daily.   Xifaxan 550 MG Tabs tablet Generic drug: rifaximin Take 550 mg by mouth 2 (two) times daily.   zaleplon 10 MG capsule Commonly known as: SONATA Take 10 mg by mouth at bedtime.       Allergies  Allergen Reactions   Melatonin Hives   Doxycycline Nausea And Vomiting   Amiodarone     hyperthyroidism   Erythromycin Nausea And Vomiting   Paxil [Paroxetine Hcl] Hives   Penicillins Hives   Sulfa Antibiotics Hives   Discharge Instructions     Call MD for:  difficulty  breathing, headache or visual disturbances   Complete by: As directed    Call MD for:  extreme fatigue   Complete by: As directed    Call MD for:  persistant dizziness or light-headedness   Complete by: As directed    Call MD for:  temperature >100.4   Complete by: As directed    Diet - low sodium heart healthy   Complete by: As directed    Discharge instructions   Complete by: As directed    Follow-up with PCP in 1 week, repeat chest x-ray after 4 weeks for resolution of pneumonia.   Increase activity slowly  Complete by: As directed        The results of significant diagnostics from this hospitalization (including imaging, microbiology, ancillary and laboratory) are listed below for reference.    Significant Diagnostic Studies: CT CHEST WO CONTRAST  Result Date: 05/25/2022 CLINICAL DATA:  Respiratory illness, nondiagnostic x-ray EXAM: CT CHEST WITHOUT CONTRAST TECHNIQUE: Multidetector CT imaging of the chest was performed following the standard protocol without IV contrast. RADIATION DOSE REDUCTION: This exam was performed according to the departmental dose-optimization program which includes automated exposure control, adjustment of the mA and/or kV according to patient size and/or use of iterative reconstruction technique. COMPARISON:  Radiograph 05/24/2022 and CT chest 07/11/2018 FINDINGS: Cardiovascular: Cardiomegaly. No pericardial effusion. Sternotomy and CABG. Coronary artery and aortic atherosclerotic calcification. The main pulmonary artery is dilated measuring 34 mm in diameter. Mediastinum/Nodes: Normal thyroid and esophagus. Unchanged borderline mediastinal adenopathy. For example a low right paratracheal node measures 11 mm (2/48). The hilar regions are poorly evaluated without IV contrast. Lungs/Pleura: Respiratory motion obscures detail. Centrilobular and paraseptal emphysema. Diffuse bronchial wall thickening and mucous plugging greatest in the lower lobes.  Peribronchovascular atelectasis/consolidation in the bilateral lower lungs. No pleural effusion or pneumothorax. Upper Abdomen: Unchanged splenomegaly.  No acute abnormality. Musculoskeletal: No chest wall mass or suspicious bone lesions identified. IMPRESSION: 1. Diffuse bronchial wall thickening and mucous plugging greatest in the lower lobes. Associated peribronchovascular atelectasis/consolidation in the bilateral lower lungs. Findings are most consistent with infection and/or aspiration. 2. Cardiomegaly with coronary artery atherosclerosis. 3. Dilated main pulmonary artery, suggesting pulmonary arterial hypertension. 4. Emphysema. Aortic Atherosclerosis (ICD10-I70.0) and Emphysema (ICD10-J43.9). Electronically Signed   By: Minerva Fester M.D.   On: 05/25/2022 00:56   DG Chest Port 1 View  Result Date: 05/24/2022 CLINICAL DATA:  SOB EXAM: PORTABLE CHEST 1 VIEW COMPARISON:  04/19/2021 FINDINGS: Cardiac silhouette appears prominent. No pneumothorax or pleural effusion. There is mild pulmonary vascular congestion. There are thoracic degenerative changes. IMPRESSION: Enlarged cardiac silhouette.  Mild pulmonary vascular congestion. Electronically Signed   By: Layla Maw M.D.   On: 05/24/2022 20:26    Microbiology: Recent Results (from the past 240 hour(s))  Resp panel by RT-PCR (RSV, Flu A&B, Covid) Anterior Nasal Swab     Status: None   Collection Time: 05/24/22  8:01 PM   Specimen: Anterior Nasal Swab  Result Value Ref Range Status   SARS Coronavirus 2 by RT PCR NEGATIVE NEGATIVE Final    Comment: (NOTE) SARS-CoV-2 target nucleic acids are NOT DETECTED.  The SARS-CoV-2 RNA is generally detectable in upper respiratory specimens during the acute phase of infection. The lowest concentration of SARS-CoV-2 viral copies this assay can detect is 138 copies/mL. A negative result does not preclude SARS-Cov-2 infection and should not be used as the sole basis for treatment or other patient  management decisions. A negative result may occur with  improper specimen collection/handling, submission of specimen other than nasopharyngeal swab, presence of viral mutation(s) within the areas targeted by this assay, and inadequate number of viral copies(<138 copies/mL). A negative result must be combined with clinical observations, patient history, and epidemiological information. The expected result is Negative.  Fact Sheet for Patients:  BloggerCourse.com  Fact Sheet for Healthcare Providers:  SeriousBroker.it  This test is no t yet approved or cleared by the [place] FDA and  has been authorized for detection and/or diagnosis of SARS-CoV-2 by FDA under an Emergency Use Authorization (EUA). This EUA will remain  in effect (meaning this test can be used) for  the duration of the COVID-19 declaration under Section 564(b)(1) of the Act, 21 U.S.C.section 360bbb-3(b)(1), unless the authorization is terminated  or revoked sooner.       Influenza A by PCR NEGATIVE NEGATIVE Final   Influenza B by PCR NEGATIVE NEGATIVE Final    Comment: (NOTE) The Xpert Xpress SARS-CoV-2/FLU/RSV plus assay is intended as an aid in the diagnosis of influenza from Nasopharyngeal swab specimens and should not be used as a sole basis for treatment. Nasal washings and aspirates are unacceptable for Xpert Xpress SARS-CoV-2/FLU/RSV testing.  Fact Sheet for Patients: BloggerCourse.com  Fact Sheet for Healthcare Providers: SeriousBroker.it  This test is not yet approved or cleared by the [place] FDA and has been authorized for detection and/or diagnosis of SARS-CoV-2 by FDA under an Emergency Use Authorization (EUA). This EUA will remain in effect (meaning this test can be used) for the duration of the COVID-19 declaration under Section 564(b)(1) of the Act, 21 U.S.C. section 360bbb-3(b)(1),  unless the authorization is terminated or revoked.     Resp Syncytial Virus by PCR NEGATIVE NEGATIVE Final    Comment: (NOTE) Fact Sheet for Patients: BloggerCourse.com  Fact Sheet for Healthcare Providers: SeriousBroker.it  This test is not yet approved or cleared by the [place] FDA and has been authorized for detection and/or diagnosis of SARS-CoV-2 by FDA under an Emergency Use Authorization (EUA). This EUA will remain in effect (meaning this test can be used) for the duration of the COVID-19 declaration under Section 564(b)(1) of the Act, 21 U.S.C. section 360bbb-3(b)(1), unless the authorization is terminated or revoked.  Performed at [facility], [address] [place], [place] [address]   Blood Culture (routine x 2)     Status: None (Preliminary result)   Collection Time: 05/24/22  8:02 PM   Specimen: BLOOD  Result Value Ref Range Status   Specimen Description BLOOD BLOOD RIGHT FOREARM  Final   Special Requests   Final    BOTTLES DRAWN AEROBIC AND ANAEROBIC Blood Culture adequate volume   Culture   Final    NO GROWTH 3 DAYS Performed at [facility], [address], [place], [place] [address]    Report Status PENDING  Incomplete  Blood Culture (routine x 2)     Status: None (Preliminary result)   Collection Time: 05/24/22  8:21 PM   Specimen: BLOOD  Result Value Ref Range Status   Specimen Description BLOOD BLOOD RIGHT HAND  Final   Special Requests   Final    BOTTLES DRAWN AEROBIC AND ANAEROBIC Blood Culture adequate volume   Culture   Final    NO GROWTH 3 DAYS Performed at [facility], [address] [place], [place] [address]    Report Status PENDING  Incomplete  Respiratory (~20 pathogens) panel by PCR     Status: Abnormal   Collection Time: 05/24/22 11:39 PM   Specimen: Nasopharyngeal Swab; Respiratory  Result Value Ref Range Status   Adenovirus NOT DETECTED NOT  DETECTED Final   Coronavirus 229E NOT DETECTED NOT DETECTED Final    Comment: (NOTE) The Coronavirus on the Respiratory Panel, DOES NOT test for the novel  Coronavirus (2019 nCoV)    Coronavirus HKU1 NOT DETECTED NOT DETECTED Final   Coronavirus NL63 NOT DETECTED NOT DETECTED Final   Coronavirus OC43 NOT DETECTED NOT DETECTED Final   Metapneumovirus NOT DETECTED NOT DETECTED Final   Rhinovirus / Enterovirus NOT DETECTED NOT DETECTED Final   Influenza A NOT DETECTED NOT DETECTED Final  Influenza B NOT DETECTED NOT DETECTED Final   Parainfluenza Virus 1 NOT DETECTED NOT DETECTED Final   Parainfluenza Virus 2 NOT DETECTED NOT DETECTED Final   Parainfluenza Virus 3 DETECTED (A) NOT DETECTED Final   Parainfluenza Virus 4 NOT DETECTED NOT DETECTED Final   Respiratory Syncytial Virus NOT DETECTED NOT DETECTED Final   Bordetella pertussis NOT DETECTED NOT DETECTED Final   Bordetella Parapertussis NOT DETECTED NOT DETECTED Final   Chlamydophila pneumoniae NOT DETECTED NOT DETECTED Final   Mycoplasma pneumoniae NOT DETECTED NOT DETECTED Final    Comment: Performed at [facility], [address] [place], [place] [address]     Labs: CBC: Recent Labs  Lab 05/24/22 2001 05/25/22 0428 05/26/22 0539 05/27/22 0549  WBC 7.0 4.8 9.0 7.0  NEUTROABS 6.3 4.3  --   --   HGB 12.8 12.6 11.7* 10.6*  HCT 36.0 34.7* 32.9* 30.4*  MCV 93.8 92.3 94.0 94.4  PLT 107* 85* 80* 100*   Basic Metabolic Panel: Recent Labs  Lab 05/24/22 2001 05/25/22 0407 05/25/22 0428 05/26/22 0539 05/27/22 0549  NA 134*  --  137 135 141  K 3.5  --  3.2* 4.4 4.0  CL 95*  --  98 99 104  CO2 27  --  GLUCOSE 164*  --  219* 109* 94  BUN 23  --  24* 22 22  CREATININE 0.96  --  0.99 0.74 0.75  CALCIUM 8.8*  --  8.9 9.0 9.1  MG  --  2.2  --  2.3 2.4  PHOS  --  5.2*  --  3.5 3.4   Liver Function Tests: Recent Labs  Lab 05/24/22 2001 05/25/22 0428  AST 43* 39  ALT 21 23  ALKPHOS 121 114  BILITOT  2.8* 1.5*  PROT 8.3* 8.1  ALBUMIN 4.8 4.5   No results for input(s): "LIPASE", "AMYLASE" in the last 168 hours. Recent Labs  Lab 05/26/22 0539  AMMONIA 25   Cardiac Enzymes: No results for input(s): "CKTOTAL", "CKMB", "CKMBINDEX", "TROPONINI" in the last 168 hours. BNP (last 3 results) Recent Labs    05/24/22 2001  BNP 289.0*   CBG: Recent Labs  Lab 05/26/22 0758 05/26/22 1229 05/26/22 1750 05/26/22 2158 05/27/22 0746  GLUCAP 101* 168* 159* 113* 96    Time spent: 35 minutes  Signed:  Gillis Santa  [facility] 05/27/2022 12:09 PM

## 2022-05-27 NOTE — Plan of Care (Signed)
  Problem: Education: Goal: Knowledge of disease or condition will improve Outcome: Progressing Goal: Knowledge of the prescribed therapeutic regimen will improve Outcome: Progressing Goal: Individualized Educational Video(s) Outcome: Progressing   Problem: Activity: Goal: Ability to tolerate increased activity will improve Outcome: Progressing Goal: Will verbalize the importance of balancing activity with adequate rest periods Outcome: Progressing   Problem: Respiratory: Goal: Ability to maintain a clear airway will improve Outcome: Progressing Goal: Levels of oxygenation will improve Outcome: Progressing Goal: Ability to maintain adequate ventilation will improve Outcome: Progressing   Problem: Activity: Goal: Ability to tolerate increased activity will improve Outcome: Progressing   Problem: Clinical Measurements: Goal: Ability to maintain a body temperature in the normal range will improve Outcome: Progressing   Problem: Respiratory: Goal: Ability to maintain adequate ventilation will improve Outcome: Progressing Goal: Ability to maintain a clear airway will improve Outcome: Progressing   Problem: Education: Goal: Ability to describe self-care measures that may prevent or decrease complications (Diabetes Survival Skills Education) will improve Outcome: Progressing Goal: Individualized Educational Video(s) Outcome: Progressing   Problem: Coping: Goal: Ability to adjust to condition or change in health will improve Outcome: Progressing   Problem: Fluid Volume: Goal: Ability to maintain a balanced intake and output will improve Outcome: Progressing   Problem: Health Behavior/Discharge Planning: Goal: Ability to identify and utilize available resources and services will improve Outcome: Progressing Goal: Ability to manage health-related needs will improve Outcome: Progressing   Problem: Metabolic: Goal: Ability to maintain appropriate glucose levels will  improve Outcome: Progressing   Problem: Nutritional: Goal: Maintenance of adequate nutrition will improve Outcome: Progressing Goal: Progress toward achieving an optimal weight will improve Outcome: Progressing   Problem: Skin Integrity: Goal: Risk for impaired skin integrity will decrease Outcome: Progressing   Problem: Tissue Perfusion: Goal: Adequacy of tissue perfusion will improve Outcome: Progressing   Problem: Education: Goal: Knowledge of General Education information will improve Description: Including pain rating scale, medication(s)/side effects and non-pharmacologic comfort measures Outcome: Progressing   Problem: Health Behavior/Discharge Planning: Goal: Ability to manage health-related needs will improve Outcome: Progressing   Problem: Clinical Measurements: Goal: Ability to maintain clinical measurements within normal limits will improve Outcome: Progressing Goal: Will remain free from infection Outcome: Progressing Goal: Diagnostic test results will improve Outcome: Progressing Goal: Respiratory complications will improve Outcome: Progressing Goal: Cardiovascular complication will be avoided Outcome: Progressing   Problem: Activity: Goal: Risk for activity intolerance will decrease Outcome: Progressing   Problem: Nutrition: Goal: Adequate nutrition will be maintained Outcome: Progressing   Problem: Coping: Goal: Level of anxiety will decrease Outcome: Progressing   Problem: Elimination: Goal: Will not experience complications related to bowel motility Outcome: Progressing Goal: Will not experience complications related to urinary retention Outcome: Progressing   Problem: Pain Managment: Goal: General experience of comfort will improve Outcome: Progressing   Problem: Safety: Goal: Ability to remain free from injury will improve Outcome: Progressing   Problem: Skin Integrity: Goal: Risk for impaired skin integrity will decrease Outcome:  Progressing   

## 2022-05-29 LAB — CULTURE, BLOOD (ROUTINE X 2)
Culture: NO GROWTH
Culture: NO GROWTH
Special Requests: ADEQUATE
Special Requests: ADEQUATE

## 2022-05-31 ENCOUNTER — Other Ambulatory Visit: Payer: Self-pay | Admitting: Internal Medicine

## 2022-06-03 ENCOUNTER — Encounter: Payer: Self-pay | Admitting: Internal Medicine

## 2022-06-03 ENCOUNTER — Ambulatory Visit: Payer: 59 | Attending: Internal Medicine | Admitting: Internal Medicine

## 2022-06-03 VITALS — BP 118/58 | HR 62 | Ht 66.0 in | Wt 185.4 lb

## 2022-06-03 DIAGNOSIS — I2510 Atherosclerotic heart disease of native coronary artery without angina pectoris: Secondary | ICD-10-CM | POA: Diagnosis not present

## 2022-06-03 DIAGNOSIS — E785 Hyperlipidemia, unspecified: Secondary | ICD-10-CM | POA: Diagnosis not present

## 2022-06-03 DIAGNOSIS — K7460 Unspecified cirrhosis of liver: Secondary | ICD-10-CM

## 2022-06-03 DIAGNOSIS — I4819 Other persistent atrial fibrillation: Secondary | ICD-10-CM

## 2022-06-03 DIAGNOSIS — R188 Other ascites: Secondary | ICD-10-CM

## 2022-06-03 DIAGNOSIS — I5022 Chronic systolic (congestive) heart failure: Secondary | ICD-10-CM

## 2022-06-03 DIAGNOSIS — I480 Paroxysmal atrial fibrillation: Secondary | ICD-10-CM

## 2022-06-03 NOTE — Patient Instructions (Signed)
Medication Instructions:  Your Physician recommend you continue on your current medication as directed.    *If you need a refill on your cardiac medications before your next appointment, please call your pharmacy*   Lab Work: None ordered   Testing/Procedures: None ordered   Follow-Up: At [facility], you and your health needs are our priority.  As part of our continuing mission to provide you with exceptional heart care, we have created designated Provider Care Teams.  These Care Teams include your primary Cardiologist (physician) and Advanced Practice Providers (APPs -  Physician Assistants and Nurse Practitioners) who all work together to provide you with the care you need, when you need it.  We recommend signing up for the patient portal called "MyChart".  Sign up information is provided on this After Visit Summary.  MyChart is used to connect with patients for Virtual Visits (Telemedicine).  Patients are able to view lab/test results, encounter notes, upcoming appointments, etc.  Non-urgent messages can be sent to your provider as well.   To learn more about what you can do with MyChart, go to [email or web address].    Your next appointment:   1 month(s)  Provider:   You may see Yvonne Kendall, MD or one of the following Advanced Practice Providers on your designated Care Team:   Nicolasa Ducking, NP Eula Listen, PA-C Cadence Fransico Michael, PA-C Charlsie Quest, NP
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
## 2022-06-04 ENCOUNTER — Ambulatory Visit: Payer: 59 | Admitting: Nurse Practitioner

## 2022-06-06 ENCOUNTER — Encounter: Payer: Self-pay | Admitting: Internal Medicine

## 2022-06-30 ENCOUNTER — Other Ambulatory Visit: Payer: Self-pay | Admitting: Internal Medicine

## 2022-06-30 NOTE — Telephone Encounter (Signed)
Last visit 06/03/2022-Follow-up: Return to clinic in 1 month.  Next visit 07/02/22

## 2022-07-15 ENCOUNTER — Ambulatory Visit: Payer: 59 | Attending: Nurse Practitioner | Admitting: Nurse Practitioner

## 2022-07-15 ENCOUNTER — Encounter: Payer: Self-pay | Admitting: Nurse Practitioner

## 2022-07-15 VITALS — BP 110/60 | HR 64 | Ht 66.0 in | Wt 195.5 lb

## 2022-07-15 DIAGNOSIS — Z720 Tobacco use: Secondary | ICD-10-CM

## 2022-07-15 DIAGNOSIS — I480 Paroxysmal atrial fibrillation: Secondary | ICD-10-CM

## 2022-07-15 DIAGNOSIS — I2510 Atherosclerotic heart disease of native coronary artery without angina pectoris: Secondary | ICD-10-CM | POA: Diagnosis not present

## 2022-07-15 DIAGNOSIS — I5022 Chronic systolic (congestive) heart failure: Secondary | ICD-10-CM

## 2022-07-15 DIAGNOSIS — E785 Hyperlipidemia, unspecified: Secondary | ICD-10-CM | POA: Diagnosis not present

## 2022-07-15 DIAGNOSIS — R9431 Abnormal electrocardiogram [ECG] [EKG]: Secondary | ICD-10-CM
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
## 2022-07-15 NOTE — Patient Instructions (Signed)
Medication Instructions:  No changes *If you need a refill on your cardiac medications before your next appointment, please call your pharmacy*   Lab Work: Your provider would like for you to have the following labs: Fasting Lipid when you have your other labs  If you have labs (blood work) drawn today and your tests are completely normal, you will receive your results only by: MyChart Message (if you have MyChart) OR A paper copy in the mail If you have any lab test that is abnormal or we need to change your treatment, we will call you to review the results.   Testing/Procedures: Your provider has ordered a Lexiscan Myoview Stress test. This will take place at [facility]. Please report to the [facility] medical mall entrance. The volunteers at the first desk will direct you where to go.  [facility] MYOVIEW  Your provider has ordered a Stress Test with nuclear imaging. The purpose of this test is to evaluate the blood supply to your heart muscle. This procedure is referred to as a "Non-Invasive Stress Test." This is because other than having an IV started in your vein, nothing is inserted or "invades" your body. Cardiac stress tests are done to find areas of poor blood flow to the heart by determining the extent of coronary artery disease (CAD). Some patients exercise on a treadmill, which naturally increases the blood flow to your heart, while others who are unable to walk on a treadmill due to physical limitations will have a pharmacologic/chemical stress agent called Lexiscan . This medicine will mimic walking on a treadmill by temporarily increasing your coronary blood flow.   Please note: these test may take anywhere between 2-4 hours to complete  How to prepare for your Myoview test:  Nothing to eat for 6 hours prior to the test No caffeine for 24 hours prior to test No smoking 24 hours prior to test. Your medication may be taken with water.  If your doctor stopped a medication because of this test,  do not take that medication. Ladies, please do not wear dresses.  Skirts or pants are appropriate. Please wear a short sleeve shirt. No perfume, cologne or lotion. Wear comfortable walking shoes. No heels!   PLEASE NOTIFY THE OFFICE AT LEAST 24 HOURS IN ADVANCE IF YOU ARE UNABLE TO KEEP YOUR APPOINTMENT.  [phone] AND  PLEASE NOTIFY NUCLEAR MEDICINE AT [facility] AT LEAST 24 HOURS IN ADVANCE IF YOU ARE UNABLE TO KEEP YOUR APPOINTMENT. [phone]    Follow-Up: At [facility], you and your health needs are our priority.  As part of our continuing mission to provide you with exceptional heart care, we have created designated Provider Care Teams.  These Care Teams include your primary Cardiologist (physician) and Advanced Practice Providers (APPs -  Physician Assistants and Nurse Practitioners) who all work together to provide you with the care you need, when you need it.  We recommend signing up for the patient portal called "MyChart".  Sign up information is provided on this After Visit Summary.  MyChart is used to connect with patients for Virtual Visits (Telemedicine).  Patients are able to view lab/test results, encounter notes, upcoming appointments, etc.  Non-urgent messages can be sent to your provider as well.   To learn more about what you can do with MyChart, go to [email or web address].    Your next appointment:   4 month(s)  Provider:   You may see Yvonne Kendall, MD or one of the following Advanced Practice Providers on your  designated Care Team:   Nicolasa Ducking, NP Eula Listen, PA-C Cadence Fransico Michael, PA-C Charlsie Quest, NP

## 2022-07-22 ENCOUNTER — Ambulatory Visit
Admission: RE | Admit: 2022-07-22 | Discharge: 2022-07-22 | Disposition: A | Discharge status: Home / Self Care | Payer: 59 | Source: Ambulatory Visit | Attending: Nurse Practitioner | Admitting: Nurse Practitioner

## 2022-07-22 DIAGNOSIS — I259 Chronic ischemic heart disease, unspecified: Secondary | ICD-10-CM | POA: Insufficient documentation

## 2022-07-22 DIAGNOSIS — I700 Atherosclerosis of aorta: Secondary | ICD-10-CM | POA: Diagnosis present

## 2022-07-22 DIAGNOSIS — R9431 Abnormal electrocardiogram [ECG] [EKG]: Secondary | ICD-10-CM | POA: Diagnosis not present

## 2022-07-22 DIAGNOSIS — I2510 Atherosclerotic heart disease of native coronary artery without angina pectoris: Secondary | ICD-10-CM | POA: Insufficient documentation

## 2022-07-22 LAB — NM MYOCAR MULTI W/SPECT W/WALL MOTION / EF
Peak HR: 76 {beats}/min
SSS: 4
TID: 1.13

## 2022-07-22 MED ORDER — REGADENOSON 0.4 MG/5ML IV SOLN
0.4000 mg | Freq: Once | INTRAVENOUS | Status: AC
  Administered 2022-07-22: 0.4 mg via INTRAVENOUS

## 2022-07-22 MED ORDER — TECHNETIUM TC 99M TETROFOSMIN IV KIT
10.0000 | PACK | Freq: Once | INTRAVENOUS | Status: AC | PRN
  Administered 2022-07-22: 10.3 via INTRAVENOUS

## 2022-07-22 MED ORDER — TECHNETIUM TC 99M TETROFOSMIN IV KIT
32.7100 | PACK | Freq: Once | INTRAVENOUS | Status: AC | PRN
  Administered 2022-07-22: 32.71 via INTRAVENOUS

## 2022-07-24 LAB — NM MYOCAR MULTI W/SPECT W/WALL MOTION / EF
LV dias vol: 140 mL (ref 46–106)
LV sys vol: 65 mL
Nuc Stress EF: 54 %
Percent HR: 48 %
Rest HR: 61 {beats}/min
Rest Nuclear Isotope Dose: 10.3 mCi
SDS: 3
SRS: 6
Stress Nuclear Isotope Dose: 32.7 mCi

## 2022-07-26 ENCOUNTER — Other Ambulatory Visit: Payer: Self-pay | Admitting: Nurse Practitioner

## 2022-07-30 ENCOUNTER — Other Ambulatory Visit: Payer: 59

## 2022-08-04 ENCOUNTER — Other Ambulatory Visit
Admission: RE | Admit: 2022-08-04 | Discharge: 2022-08-04 | Disposition: A | Discharge status: Home / Self Care | Payer: 59 | Source: Ambulatory Visit | Attending: Nurse Practitioner | Admitting: Nurse Practitioner

## 2022-08-04 ENCOUNTER — Other Ambulatory Visit
Admission: RE | Admit: 2022-08-04 | Discharge: 2022-08-04 | Disposition: A | Discharge status: Home / Self Care | Payer: 59 | Source: Ambulatory Visit | Attending: Cardiology | Admitting: Cardiology

## 2022-08-04 DIAGNOSIS — I4819 Other persistent atrial fibrillation: Secondary | ICD-10-CM

## 2022-08-04 DIAGNOSIS — E785 Hyperlipidemia, unspecified: Secondary | ICD-10-CM | POA: Diagnosis not present

## 2022-08-04 LAB — CBC WITH DIFFERENTIAL/PLATELET
Abs Immature Granulocytes: 0.02 10*3/uL (ref 0.00–0.07)
Basophils Absolute: 0 10*3/uL (ref 0.0–0.1)
Basophils Relative: 0 %
Eosinophils Absolute: 0.1 10*3/uL (ref 0.0–0.5)
Eosinophils Relative: 1 %
HCT: 35 % — ABNORMAL LOW (ref 36.0–46.0)
Hemoglobin: 13 g/dL (ref 12.0–15.0)
Immature Granulocytes: 0 %
Lymphocytes Relative: 31 %
Lymphs Abs: 1.6 10*3/uL (ref 0.7–4.0)
MCH: 33.9 pg (ref 26.0–34.0)
MCHC: 37.1 g/dL — ABNORMAL HIGH (ref 30.0–36.0)
MCV: 91.4 fL (ref 80.0–100.0)
Monocytes Absolute: 0.6 10*3/uL (ref 0.1–1.0)
Monocytes Relative: 11 %
Neutro Abs: 2.9 10*3/uL (ref 1.7–7.7)
Neutrophils Relative %: 57 %
Platelets: 139 10*3/uL — ABNORMAL LOW (ref 150–400)
RBC: 3.83 MIL/uL — ABNORMAL LOW (ref 3.87–5.11)
RDW: 14.9 % (ref 11.5–15.5)
WBC: 5.2 10*3/uL (ref 4.0–10.5)
nRBC: 0 % (ref 0.0–0.2)

## 2022-08-04 LAB — BASIC METABOLIC PANEL
Anion gap: 11 (ref 5–15)
BUN: 13 mg/dL (ref 8–23)
CO2: 27 mmol/L (ref 22–32)
Calcium: 9.2 mg/dL (ref 8.9–10.3)
Chloride: 100 mmol/L (ref 98–111)
Creatinine, Ser: 0.67 mg/dL (ref 0.44–1.00)
GFR, Estimated: >60 mL/min (ref >60)
Glucose, Bld: 139 mg/dL — ABNORMAL HIGH (ref 70–99)
Potassium: 3.8 mmol/L (ref 3.5–5.1)
Sodium: 138 mmol/L (ref 135–145)

## 2022-08-04 LAB — LIPID PANEL
Cholesterol: 123 mg/dL (ref 0–200)
HDL: 43 mg/dL (ref >40)
LDL Cholesterol: 47 mg/dL (ref 0–99)
Total CHOL/HDL Ratio: 2.9 RATIO
Triglycerides: 167 mg/dL — ABNORMAL HIGH (ref <150)
VLDL: 33 mg/dL (ref 0–40)

## 2022-08-11 ENCOUNTER — Ambulatory Visit
Admission: RE | Admit: 2022-08-11 | Discharge: 2022-08-11 | Disposition: A | Discharge status: Home / Self Care | Payer: 59 | Source: Ambulatory Visit | Attending: Oncology | Admitting: Oncology

## 2022-08-11 DIAGNOSIS — C50412 Malignant neoplasm of upper-outer quadrant of left female breast: Secondary | ICD-10-CM | POA: Diagnosis present

## 2022-08-11 DIAGNOSIS — Z170 Estrogen receptor positive status [ER+]: Secondary | ICD-10-CM | POA: Diagnosis present

## 2022-08-11 HISTORY — DX: Malignant neoplasm of unspecified site of unspecified female breast: C50.919

## 2022-08-11 HISTORY — DX: Personal history of irradiation: Z92.3

## 2022-08-12 ENCOUNTER — Telehealth (HOSPITAL_COMMUNITY): Payer: Self-pay | Admitting: Emergency Medicine

## 2022-08-12 NOTE — Telephone Encounter (Signed)
Reaching out to patient to offer assistance regarding upcoming cardiac imaging study; pt verbalizes understanding of appt date/time, parking situation and where to check in, pre-test NPO status and medications ordered, and verified current allergies; name and call back number provided for further questions should they arise Myron Lona RN Navigator Cardiac Imaging [facility] [phone] office [phone] cell 

## 2022-08-13 ENCOUNTER — Ambulatory Visit
Admission: RE | Admit: 2022-08-13 | Discharge: 2022-08-13 | Disposition: A | Discharge status: Home / Self Care | Payer: 59 | Source: Ambulatory Visit | Attending: Cardiology | Admitting: Cardiology

## 2022-08-13 DIAGNOSIS — I4819 Other persistent atrial fibrillation: Secondary | ICD-10-CM | POA: Diagnosis present

## 2022-08-13 MED ORDER — SODIUM CHLORIDE 0.9 % IV SOLN: INTRAVENOUS | Status: DC

## 2022-08-13 MED ORDER — IOHEXOL 350 MG/ML SOLN
100.0000 mL | Freq: Once | INTRAVENOUS | Status: AC | PRN
  Administered 2022-08-13: 100 mL via INTRAVENOUS

## 2022-08-18 NOTE — Pre-Procedure Instructions (Signed)
Instructed patient on the following items: Arrival time 0530 Nothing to eat or drink after midnight No meds AM of procedure Responsible person to drive you home and stay with you for 24 hrs  Have you missed any doses of anti-coagulant Eliquis-  takes twice a day, hasn't missed any doses.  Don't take dose on Friday morning.   

## 2022-08-19 NOTE — Anesthesia Preprocedure Evaluation (Addendum)
Anesthesia Evaluation  Patient identified by MRN, date of birth, ID band Patient awake    Reviewed: Allergy & Precautions, NPO status , Patient's Chart, lab work & pertinent test results, reviewed documented beta blocker date and time   History of Anesthesia Complications Negative for: history of anesthetic complications  Airway Mallampati: II  TM Distance: >3 FB Neck ROM: Full    Dental  (+) Lower Dentures, Upper Dentures   Pulmonary asthma , sleep apnea and Continuous Positive Airway Pressure Ventilation , COPD, Current Smoker and Patient abstained from smoking.   Pulmonary exam normal        Cardiovascular hypertension, Pt. on medications and Pt. on home beta blockers + CAD and + Cardiac Stents  Normal cardiovascular exam+ dysrhythmias Atrial Fibrillation and Supra Ventricular Tachycardia    '24 Myoperfusion - Pharmacological myocardial perfusion imaging study with small region of ischemia in the inferoapical region. Normal wall motion, EF estimated at 53%. No EKG changes concerning for ischemia at peak stress or in recovery. CT attenuation correction images with mild aortic atherosclerosis, coronary calcification of the left circumflex and RCA. Low risk scan    Neuro/Psych  PSYCHIATRIC DISORDERS Anxiety Depression Bipolar Disorder   negative neurological ROS     GI/Hepatic ,GERD  Medicated and Controlled,,(+)     substance abuse  cocaine use  Endo/Other  diabetes, Type 2    Renal/GU CRFRenal disease     Musculoskeletal  (+) Arthritis ,    Abdominal   Peds  Hematology  On eliquis    Anesthesia Other Findings   Reproductive/Obstetrics                             Anesthesia Physical Anesthesia Plan  ASA: 3  Anesthesia Plan: General   Post-op Pain Management: Minimal or no pain anticipated   Induction: Intravenous  PONV Risk Score and Plan: 2 and Treatment may vary due to age or  medical condition, Ondansetron, Dexamethasone and Midazolam  Airway Management Planned: Oral ETT  Additional Equipment: None  Intra-op Plan:   Post-operative Plan: Extubation in OR  Informed Consent: I have reviewed the patients History and Physical, chart, labs and discussed the procedure including the risks, benefits and alternatives for the proposed anesthesia with the patient or authorized representative who has indicated his/her understanding and acceptance.     Dental advisory given  Plan Discussed with: CRNA and Anesthesiologist  Anesthesia Plan Comments:         Anesthesia Quick Evaluation

## 2022-08-20 ENCOUNTER — Other Ambulatory Visit: Payer: Self-pay

## 2022-08-20 ENCOUNTER — Encounter (HOSPITAL_COMMUNITY): Payer: Self-pay | Admitting: Cardiology

## 2022-08-20 ENCOUNTER — Encounter (HOSPITAL_COMMUNITY)
Admission: RE | Disposition: A | Discharge status: Home / Self Care | Payer: Self-pay | Source: Home / Self Care | Attending: Cardiology

## 2022-08-20 ENCOUNTER — Ambulatory Visit (HOSPITAL_COMMUNITY)
Admission: RE | Admit: 2022-08-20 | Discharge: 2022-08-20 | Disposition: A | Discharge status: Home / Self Care | Payer: 59 | Attending: Cardiology | Admitting: Cardiology

## 2022-08-20 ENCOUNTER — Ambulatory Visit (HOSPITAL_COMMUNITY): Payer: 59 | Admitting: Anesthesiology

## 2022-08-20 ENCOUNTER — Ambulatory Visit (HOSPITAL_BASED_OUTPATIENT_CLINIC_OR_DEPARTMENT_OTHER): Payer: 59 | Admitting: Anesthesiology

## 2022-08-20 ENCOUNTER — Other Ambulatory Visit (HOSPITAL_COMMUNITY): Payer: Self-pay

## 2022-08-20 DIAGNOSIS — K219 Gastro-esophageal reflux disease without esophagitis: Secondary | ICD-10-CM | POA: Insufficient documentation

## 2022-08-20 DIAGNOSIS — Z7901 Long term (current) use of anticoagulants: Secondary | ICD-10-CM | POA: Insufficient documentation

## 2022-08-20 DIAGNOSIS — I5022 Chronic systolic (congestive) heart failure: Secondary | ICD-10-CM | POA: Diagnosis not present

## 2022-08-20 DIAGNOSIS — R188 Other ascites: Secondary | ICD-10-CM | POA: Insufficient documentation

## 2022-08-20 DIAGNOSIS — I4819 Other persistent atrial fibrillation: Secondary | ICD-10-CM | POA: Diagnosis not present

## 2022-08-20 DIAGNOSIS — N183 Chronic kidney disease, stage 3 unspecified: Secondary | ICD-10-CM | POA: Diagnosis not present

## 2022-08-20 DIAGNOSIS — I2510 Atherosclerotic heart disease of native coronary artery without angina pectoris: Secondary | ICD-10-CM | POA: Insufficient documentation

## 2022-08-20 DIAGNOSIS — K7460 Unspecified cirrhosis of liver: Secondary | ICD-10-CM | POA: Diagnosis not present

## 2022-08-20 DIAGNOSIS — E785 Hyperlipidemia, unspecified: Secondary | ICD-10-CM | POA: Diagnosis not present

## 2022-08-20 DIAGNOSIS — I5042 Chronic combined systolic (congestive) and diastolic (congestive) heart failure: Secondary | ICD-10-CM | POA: Diagnosis not present

## 2022-08-20 DIAGNOSIS — I132 Hypertensive heart and chronic kidney disease with heart failure and with stage 5 chronic kidney disease, or end stage renal disease: Secondary | ICD-10-CM | POA: Diagnosis not present

## 2022-08-20 DIAGNOSIS — J449 Chronic obstructive pulmonary disease, unspecified: Secondary | ICD-10-CM | POA: Insufficient documentation

## 2022-08-20 DIAGNOSIS — F319 Bipolar disorder, unspecified: Secondary | ICD-10-CM | POA: Insufficient documentation

## 2022-08-20 DIAGNOSIS — I4891 Unspecified atrial fibrillation: Secondary | ICD-10-CM

## 2022-08-20 HISTORY — PX: CARDIAC ELECTROPHYSIOLOGY MAPPING AND ABLATION: SHX1292

## 2022-08-20 HISTORY — PX: ATRIAL FIBRILLATION ABLATION: EP1191

## 2022-08-20 LAB — POCT ACTIVATED CLOTTING TIME
Activated Clotting Time: 379 seconds
Activated Clotting Time: 281 seconds

## 2022-08-20 LAB — GLUCOSE, CAPILLARY
Glucose-Capillary: 128 mg/dL — ABNORMAL HIGH (ref 70–99)
Glucose-Capillary: 135 mg/dL — ABNORMAL HIGH (ref 70–99)
Glucose-Capillary: 168 mg/dL — ABNORMAL HIGH (ref 70–99)

## 2022-08-20 SURGERY — ATRIAL FIBRILLATION ABLATION
Anesthesia: General

## 2022-08-20 MED ORDER — SODIUM CHLORIDE 0.9 % IV SOLN: INTRAVENOUS | Status: DC

## 2022-08-20 MED ORDER — ROCURONIUM BROMIDE 10 MG/ML (PF) SYRINGE
PREFILLED_SYRINGE | INTRAVENOUS | Status: DC | PRN
  Administered 2022-08-20: 10 mg via INTRAVENOUS
  Administered 2022-08-20: 50 mg via INTRAVENOUS
  Administered 2022-08-20 (×2): 10 mg via INTRAVENOUS

## 2022-08-20 MED ORDER — PHENYLEPHRINE HCL-NACL 20-0.9 MG/250ML-% IV SOLN
INTRAVENOUS | Status: DC | PRN
  Administered 2022-08-20: 25 ug/min via INTRAVENOUS

## 2022-08-20 MED ORDER — HEPARIN SODIUM (PORCINE) 1000 UNIT/ML IJ SOLN
INTRAMUSCULAR | Status: DC | PRN
  Administered 2022-08-20: 2000 [IU] via INTRAVENOUS
  Administered 2022-08-20: 12000 [IU] via INTRAVENOUS

## 2022-08-20 MED ORDER — ACETAMINOPHEN 325 MG PO TABS: 650.0000 mg | ORAL_TABLET | ORAL | Status: DC | PRN

## 2022-08-20 MED ORDER — DEXAMETHASONE SODIUM PHOSPHATE 10 MG/ML IJ SOLN
INTRAMUSCULAR | Status: DC | PRN
  Administered 2022-08-20: 5 mg via INTRAVENOUS

## 2022-08-20 MED ORDER — PHENYLEPHRINE 80 MCG/ML (10ML) SYRINGE FOR IV PUSH (FOR BLOOD PRESSURE SUPPORT)
PREFILLED_SYRINGE | INTRAVENOUS | Status: DC | PRN
  Administered 2022-08-20 (×2): 80 ug via INTRAVENOUS
  Administered 2022-08-20 (×2): 120 ug via INTRAVENOUS

## 2022-08-20 MED ORDER — ONDANSETRON HCL 4 MG/2ML IJ SOLN
INTRAMUSCULAR | Status: DC | PRN
  Administered 2022-08-20: 4 mg via INTRAVENOUS

## 2022-08-20 MED ORDER — COLCHICINE 0.6 MG PO TABS
0.6000 mg | ORAL_TABLET | Freq: Two times a day (BID) | ORAL | 0 refills | Status: DC
  Filled 2022-08-20: qty 10, 5d supply, fill #0

## 2022-08-20 MED ORDER — FENTANYL CITRATE (PF) 250 MCG/5ML IJ SOLN
INTRAMUSCULAR | Status: DC | PRN
  Administered 2022-08-20 (×2): 50 ug via INTRAVENOUS

## 2022-08-20 MED ORDER — PANTOPRAZOLE SODIUM 40 MG PO TBEC
40.0000 mg | DELAYED_RELEASE_TABLET | Freq: Every day | ORAL | 0 refills | Status: DC
  Filled 2022-08-20: qty 30, 30d supply, fill #0

## 2022-08-20 MED ORDER — APIXABAN 5 MG PO TABS
5.0000 mg | ORAL_TABLET | Freq: Two times a day (BID) | ORAL | Status: DC
  Filled 2022-08-20 (×2): qty 1

## 2022-08-20 MED ORDER — ALBUTEROL SULFATE HFA 108 (90 BASE) MCG/ACT IN AERS
INHALATION_SPRAY | RESPIRATORY_TRACT | Status: DC | PRN
  Administered 2022-08-20: 6 via RESPIRATORY_TRACT

## 2022-08-20 MED ORDER — HEPARIN SODIUM (PORCINE) 1000 UNIT/ML IJ SOLN
INTRAMUSCULAR | Status: AC
  Filled 2022-08-20: qty 10

## 2022-08-20 MED ORDER — PROPOFOL 10 MG/ML IV BOLUS
INTRAVENOUS | Status: DC | PRN
  Administered 2022-08-20: 120 mg via INTRAVENOUS

## 2022-08-20 MED ORDER — PROTAMINE SULFATE 10 MG/ML IV SOLN
INTRAVENOUS | Status: DC | PRN
  Administered 2022-08-20: 35 mg via INTRAVENOUS

## 2022-08-20 MED ORDER — HEPARIN (PORCINE) IN NACL 1000-0.9 UT/500ML-% IV SOLN
INTRAVENOUS | Status: DC | PRN
  Administered 2022-08-20 (×2): 500 mL

## 2022-08-20 MED ORDER — SODIUM CHLORIDE 0.9% FLUSH: 3.0000 mL | Freq: Two times a day (BID) | INTRAVENOUS | Status: DC

## 2022-08-20 MED ORDER — COLCHICINE 0.6 MG PO TABS
0.6000 mg | ORAL_TABLET | Freq: Two times a day (BID) | ORAL | Status: DC
  Filled 2022-08-20: qty 1

## 2022-08-20 MED ORDER — ALBUMIN HUMAN 25 % IV SOLN
12.5000 g | Freq: Once | INTRAVENOUS | Status: AC
  Administered 2022-08-20: 12.5 g via INTRAVENOUS
  Filled 2022-08-20: qty 50

## 2022-08-20 MED ORDER — SODIUM CHLORIDE 0.9 % IV SOLN: 250.0000 mL | INTRAVENOUS | Status: DC | PRN

## 2022-08-20 MED ORDER — SODIUM CHLORIDE 0.9% FLUSH: 3.0000 mL | INTRAVENOUS | Status: DC | PRN

## 2022-08-20 MED ORDER — MIDAZOLAM HCL 2 MG/2ML IJ SOLN
INTRAMUSCULAR | Status: DC | PRN
  Administered 2022-08-20: 2 mg via INTRAVENOUS

## 2022-08-20 MED ORDER — PANTOPRAZOLE SODIUM 40 MG PO TBEC
40.0000 mg | DELAYED_RELEASE_TABLET | Freq: Every day | ORAL | Status: DC
  Filled 2022-08-20: qty 1

## 2022-08-20 MED ORDER — HEPARIN SODIUM (PORCINE) 1000 UNIT/ML IJ SOLN
INTRAMUSCULAR | Status: DC | PRN
  Administered 2022-08-20: 1000 [IU] via INTRAVENOUS

## 2022-08-20 MED ORDER — SUGAMMADEX SODIUM 200 MG/2ML IV SOLN
INTRAVENOUS | Status: DC | PRN
  Administered 2022-08-20: 200 mg via INTRAVENOUS

## 2022-08-20 MED ORDER — LIDOCAINE 2% (20 MG/ML) 5 ML SYRINGE
INTRAMUSCULAR | Status: DC | PRN
  Administered 2022-08-20: 60 mg via INTRAVENOUS

## 2022-08-20 SURGICAL SUPPLY — 19 items
BAG SNAP BAND KOVER 36X36 (MISCELLANEOUS) IMPLANT
BLANKET WARM UNDERBOD FULL ACC (MISCELLANEOUS) ×1 IMPLANT
CATH ABLAT QDOT MICRO BI TC DF (CATHETERS) IMPLANT
CATH OCTARAY 2.0 F 3-3-3-3-3 (CATHETERS) IMPLANT
CATH S-M CIRCA TEMP PROBE (CATHETERS) IMPLANT
CATH SOUNDSTAR ECO 8FR (CATHETERS) IMPLANT
CATH WEBSTER BI DIR CS D-F CRV (CATHETERS) IMPLANT
CLOSURE PERCLOSE PROSTYLE (VASCULAR PRODUCTS) IMPLANT
COVER SWIFTLINK CONNECTOR (BAG) ×1 IMPLANT
PACK EP LATEX FREE (CUSTOM PROCEDURE TRAY) ×1
PACK EP LF (CUSTOM PROCEDURE TRAY) ×1 IMPLANT
PAD DEFIB RADIO PHYSIO CONN (PAD) ×1 IMPLANT
PATCH CARTO3 (PAD) IMPLANT
SHEATH BAYLIS TRANSSEPTAL 98CM (NEEDLE) IMPLANT
SHEATH CARTO VIZIGO SM CVD (SHEATH) IMPLANT
SHEATH PINNACLE 8F 10CM (SHEATH) IMPLANT
SHEATH PINNACLE 9F 10CM (SHEATH) IMPLANT
SHEATH PROBE COVER 6X72 (BAG) IMPLANT
TUBING SMART ABLATE COOLFLOW (TUBING) IMPLANT

## 2022-08-20 NOTE — Discharge Instructions (Signed)

## 2022-08-20 NOTE — Anesthesia Procedure Notes (Signed)
Procedure Name: Intubation Date/Time: 08/20/2022 7:44 AM  Performed by: Randon Goldsmith, CRNAPre-anesthesia Checklist: Patient identified, Emergency Drugs available, Suction available and Patient being monitored Patient Re-evaluated:Patient Re-evaluated prior to induction Oxygen Delivery Method: Circle system utilized Preoxygenation: Pre-oxygenation with 100% oxygen Induction Type: IV induction Ventilation: Mask ventilation without difficulty Laryngoscope Size: Mac and 3 Grade View: Grade I Tube type: Oral Tube size: 7.0 mm Number of attempts: 1 Airway Equipment and Method: Stylet and Oral airway Placement Confirmation: ETT inserted through vocal cords under direct vision, positive ETCO2 and breath sounds checked- equal and bilateral Secured at: 22 cm Tube secured with: Tape Dental Injury: Teeth and Oropharynx as per pre-operative assessment

## 2022-08-20 NOTE — H&P (Signed)
Electrophysiology Office Note:     Date:  08/20/2022    ID:  Doneen Poisson, DOB 1960/04/22, MRN [id number]   [facility] Cardiologist:  Yvonne Kendall, MD  [facility] Electrophysiologist:  Lanier Prude, MD    Referring MD: Antonieta Iba, MD    Chief Complaint: Atrial fibrillation   History of Present Illness:     Ariana Riggs is a 62 y.o. female who I am seeing today for an evaluation of atrial fibrillation at the request of Dr. Okey Dupre.  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  The patient also has a history of surgical ASD repair.  There is a small Dacron patch on the superior portion of the septum..  The patient last saw Dr. Okey Dupre April 09, 2022.  The patient has a history of thyroid storm which is complicated her A-fib management.   At the appointment with Dr. Okey Dupre, her metoprolol was down titrated to see if this would help with fatigue.   Her last TSH on December 23, 2021 was 1.97.   Presents for PVI today. Procedure reviewed. No missed doses of her anticoagulant.   Objective  Their past medical, social and family history was reveiwed.     ROS:   Please see the history of present illness.    All other systems reviewed and are negative.   EKGs/Labs/Other Studies Reviewed:     The following studies were reviewed today:   April 09, 2022 EKG reviewed and shows sinus bradycardia with a QTc of 505 ms.         Physical Exam:     VS:  BP 124/63   Pulse 72   Ht 5\' 6"  (1.676 m)   Wt 185 lb 9.6 oz (84.2 kg)   LMP  (LMP Unknown)   SpO2 97%   BMI 29.96 kg/m         Wt Readings from Last 3 Encounters:  04/28/22 185 lb 9.6 oz (84.2 kg)  04/09/22 192 lb 8 oz (87.3 kg)  04/01/22 191 lb (86.6 kg)      GEN:  Well nourished, well developed in no acute distress.  Obese CARDIAC: RRR, no  murmurs, rubs, gallops RESPIRATORY:  Clear to auscultation without rales, wheezing or rhonchi          Assessment ASSESSMENT AND PLAN:     1. Persistent atrial fibrillation (HCC)   2. Chronic HFrEF (heart failure with reduced ejection fraction) (HCC)   3. Cirrhosis of liver with ascites, unspecified hepatic cirrhosis type (HCC)   4. H/O congenital atrial septal defect (ASD) repair       #Persistent atrial fibrillation Associated with systolic heart failure.  Rhythm control is indicated.  Options for rhythm control are limited because of QT prolongation and history of significant hyperthyroidism.  I discussed treatment options.  I do not think we have a reasonable antiarrhythmic drug option (sotalol and Tikosyn contraindicated because of QTc prolongation, amiodarone contraindicated because of recent thyroid issues, Multaq contraindicated because of heart failure history).  I discussed ablation in detail with the patient include the risks and recovery and likelihood of success and she wishes to proceed with scheduling.   We did discuss how her history of surgical ASD repair impacts risks and possible success rate of the ablation procedure.  I have reviewed her CT scan which shows the Dacron patch is superiorly positioned  on the interatrial septum.  I believe there is a rim of tissue through which I can access the left atrium more inferior to the patch.  We discussed how if this is not the case, would abandon the procedure and plan for medical therapy.     Continue Eliquis for stroke prophylaxis   Discussed treatment options today for AF including antiarrhythmic drug therapy and ablation. Discussed risks, recovery and likelihood of success with each treatment strategy. Risk, benefits, and alternatives to EP study and radiofrequency ablation for afib were discussed. These risks include but are not limited to stroke, bleeding, vascular damage, tamponade, perforation, damage to the esophagus, lungs,  phrenic nerve and other structures, pulmonary vein stenosis, worsening renal function, and death.  Discussed potential need for repeat ablation procedures and antiarrhythmic drugs after an initial ablation. The patient understands these risk and wishes to proceed.  We will therefore proceed with catheter ablation at the next available time.  Carto, ICE, anesthesia are requested for the procedure.  Will also obtain CT PV protocol prior to the procedure to exclude LAA thrombus and further evaluate atrial anatomy.  #Prolonged QTc Avoid QT prolonging medications.       #History of cirrhosis Platelet count acceptable for above procedure.  Recent ultrasound of the liver shows normal echogenicity.   Presents for PVI today. Procedure reviewed.     Signed, Rossie Muskrat. Lalla Brothers, MD, [facility], [facility] 08/20/2022 Electrophysiology [facility]

## 2022-08-20 NOTE — Anesthesia Postprocedure Evaluation (Signed)
Anesthesia Post Note  Patient: Ariana Riggs  Procedure(s) Performed: ATRIAL FIBRILLATION ABLATION     Patient location during evaluation: PACU Anesthesia Type: General Level of consciousness: awake and alert Pain management: pain level controlled Vital Signs Assessment: post-procedure vital signs reviewed and stable Respiratory status: spontaneous breathing, nonlabored ventilation and respiratory function stable Cardiovascular status: stable and blood pressure returned to baseline Anesthetic complications: no   There were no known notable events for this encounter.  Last Vitals:  Vitals:   08/20/22 1050 08/20/22 1100  BP: 97/60 92/60  Pulse:  (!) 57  Resp:    Temp:    SpO2:  (!) 87%    Last Pain:  Vitals:   08/20/22 1100  TempSrc:   PainSc: 0-No pain                 Beryle Lathe

## 2022-08-20 NOTE — Transfer of Care (Signed)
Immediate Anesthesia Transfer of Care Note  Patient: Ariana Riggs  Procedure(s) Performed: ATRIAL FIBRILLATION ABLATION  Patient Location: Cath Lab  Anesthesia Type:General  Level of Consciousness: awake, alert , and oriented  Airway & Oxygen Therapy: Patient Spontanous Breathing and Patient connected to nasal cannula oxygen  Post-op Assessment: Report given to RN and Post -op Vital signs reviewed and stable  Post vital signs: Reviewed and stable  Last Vitals:  Vitals Value Taken Time  BP 104/58 08/20/22 0949  Temp    Pulse 62 08/20/22 0952  Resp 15 08/20/22 0952  SpO2 93 % 08/20/22 0952  Vitals shown include unvalidated device data.  Last Pain:  Vitals:   08/20/22 0522  TempSrc: Temporal  PainSc: 0-No pain         Complications: There were no known notable events for this encounter.

## 2022-08-23 ENCOUNTER — Encounter (HOSPITAL_COMMUNITY): Payer: Self-pay | Admitting: Cardiology

## 2022-09-03 ENCOUNTER — Encounter: Payer: Self-pay | Admitting: Oncology

## 2022-09-03 ENCOUNTER — Inpatient Hospital Stay (HOSPITAL_BASED_OUTPATIENT_CLINIC_OR_DEPARTMENT_OTHER): Payer: 59 | Admitting: Oncology

## 2022-09-03 ENCOUNTER — Inpatient Hospital Stay: Payer: 59 | Attending: Oncology

## 2022-09-03 VITALS — BP 139/64 | HR 69 | Temp 97.3°F | Resp 18 | Ht 66.0 in | Wt 203.9 lb

## 2022-09-03 DIAGNOSIS — Z79899 Other long term (current) drug therapy: Secondary | ICD-10-CM | POA: Insufficient documentation

## 2022-09-03 DIAGNOSIS — Z5181 Encounter for therapeutic drug level monitoring: Secondary | ICD-10-CM

## 2022-09-03 DIAGNOSIS — Z853 Personal history of malignant neoplasm of breast: Secondary | ICD-10-CM

## 2022-09-03 DIAGNOSIS — F17210 Nicotine dependence, cigarettes, uncomplicated: Secondary | ICD-10-CM | POA: Diagnosis not present

## 2022-09-03 DIAGNOSIS — Z79811 Long term (current) use of aromatase inhibitors: Secondary | ICD-10-CM

## 2022-09-03 DIAGNOSIS — Z170 Estrogen receptor positive status [ER+]: Secondary | ICD-10-CM | POA: Diagnosis not present

## 2022-09-03 DIAGNOSIS — M85852 Other specified disorders of bone density and structure, left thigh: Secondary | ICD-10-CM | POA: Diagnosis not present

## 2022-09-03 DIAGNOSIS — Z08 Encounter for follow-up examination after completed treatment for malignant neoplasm: Secondary | ICD-10-CM

## 2022-09-03 DIAGNOSIS — C50812 Malignant neoplasm of overlapping sites of left female breast: Secondary | ICD-10-CM | POA: Insufficient documentation

## 2022-09-03 DIAGNOSIS — Z923 Personal history of irradiation: Secondary | ICD-10-CM | POA: Diagnosis not present

## 2022-09-03 DIAGNOSIS — Z7901 Long term (current) use of anticoagulants: Secondary | ICD-10-CM | POA: Diagnosis not present

## 2022-09-03 LAB — CBC WITH DIFFERENTIAL (CANCER CENTER ONLY)
Abs Immature Granulocytes: 0.01 10*3/uL (ref 0.00–0.07)
Basophils Absolute: 0 10*3/uL (ref 0.0–0.1)
Basophils Relative: 0 %
Eosinophils Absolute: 0.1 10*3/uL (ref 0.0–0.5)
Eosinophils Relative: 1 %
HCT: 34.6 % — ABNORMAL LOW (ref 36.0–46.0)
Hemoglobin: 12.6 g/dL (ref 12.0–15.0)
Immature Granulocytes: 0 %
Lymphocytes Relative: 21 %
Lymphs Abs: 1 10*3/uL (ref 0.7–4.0)
MCH: 34.2 pg — ABNORMAL HIGH (ref 26.0–34.0)
MCHC: 36.4 g/dL — ABNORMAL HIGH (ref 30.0–36.0)
MCV: 94 fL (ref 80.0–100.0)
Monocytes Absolute: 0.4 10*3/uL (ref 0.1–1.0)
Monocytes Relative: 7 %
Neutro Abs: 3.3 10*3/uL (ref 1.7–7.7)
Neutrophils Relative %: 71 %
Platelet Count: 122 10*3/uL — ABNORMAL LOW (ref 150–400)
RBC: 3.68 MIL/uL — ABNORMAL LOW (ref 3.87–5.11)
RDW: 15.3 % (ref 11.5–15.5)
WBC Count: 4.7 10*3/uL (ref 4.0–10.5)
nRBC: 0 % (ref 0.0–0.2)

## 2022-09-03 LAB — CMP (CANCER CENTER ONLY)
ALT: 19 U/L (ref 0–44)
AST: 27 U/L (ref 15–41)
Albumin: 4.5 g/dL (ref 3.5–5.0)
Alkaline Phosphatase: 111 U/L (ref 38–126)
Anion gap: 10 (ref 5–15)
BUN: 12 mg/dL (ref 8–23)
CO2: 24 mmol/L (ref 22–32)
Calcium: 9 mg/dL (ref 8.9–10.3)
Chloride: 102 mmol/L (ref 98–111)
Creatinine: 0.8 mg/dL (ref 0.44–1.00)
GFR, Estimated: >60 mL/min (ref >60)
Glucose, Bld: 188 mg/dL — ABNORMAL HIGH (ref 70–99)
Potassium: 4 mmol/L (ref 3.5–5.1)
Sodium: 136 mmol/L (ref 135–145)
Total Bilirubin: 0.9 mg/dL (ref 0.3–1.2)
Total Protein: 7.5 g/dL (ref 6.5–8.1)
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
## 2022-09-04 ENCOUNTER — Other Ambulatory Visit: Payer: Self-pay | Admitting: Internal Medicine

## 2022-09-08 ENCOUNTER — Other Ambulatory Visit: Payer: Self-pay

## 2022-09-08 ENCOUNTER — Ambulatory Visit
Admission: EM | Admit: 2022-09-08 | Discharge: 2022-09-08 | Disposition: A | Discharge status: Home / Self Care | Payer: 59 | Attending: Family Medicine | Admitting: Family Medicine

## 2022-09-08 DIAGNOSIS — U071 COVID-19: Secondary | ICD-10-CM

## 2022-09-08 LAB — SARS CORONAVIRUS 2 BY RT PCR: SARS Coronavirus 2 by RT PCR: POSITIVE — AB
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
## 2022-09-08 NOTE — ED Triage Notes (Addendum)
Pt c/o chills, nasal congestion symptoms started on Sunday pt denies fever. Pt states she took a COVID test on Sunday and it came up positive and pt wants to confirm the positive test. Pt states she has a mild cough and SOB since ablation on 08/20/22 pt has not f/u with cardiologist yet

## 2022-09-11 NOTE — Discharge Instructions (Signed)

## 2022-09-14 NOTE — Progress Notes (Signed)
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 and altered mental states. We also discussed that there is  always a risk not just to her but also people around her. she has been encouraged to call the office with any questions or concerns that should arise related to todays visit.  No orders of the defined types were placed in this encounter.       I have personally obtained a history, examined the patient, evaluated laboratory and imaging results, formulated the assessment and plan and placed orders.  Yevonne Pax, MD [facility] Diplomate ABMS Pulmonary Critical Care Medicine and Sleep Medicine

## 2022-09-15 ENCOUNTER — Ambulatory Visit: Payer: 59 | Admitting: Internal Medicine

## 2022-09-15 DIAGNOSIS — Z91199 Patient's noncompliance with other medical treatment and regimen due to unspecified reason: Secondary | ICD-10-CM

## 2022-09-17 ENCOUNTER — Ambulatory Visit (HOSPITAL_COMMUNITY): Payer: 59 | Admitting: Physician Assistant

## 2022-10-01 ENCOUNTER — Encounter (HOSPITAL_COMMUNITY): Payer: Self-pay | Admitting: Physician Assistant

## 2022-10-01 ENCOUNTER — Ambulatory Visit (HOSPITAL_COMMUNITY)
Admission: RE | Admit: 2022-10-01 | Discharge: 2022-10-01 | Disposition: A | Discharge status: Home / Self Care | Payer: 59 | Source: Ambulatory Visit | Attending: Physician Assistant | Admitting: Physician Assistant

## 2022-10-01 VITALS — BP 128/72 | HR 71 | Ht 66.0 in | Wt 196.8 lb

## 2022-10-01 DIAGNOSIS — I4819 Other persistent atrial fibrillation: Secondary | ICD-10-CM | POA: Diagnosis not present

## 2022-10-01 DIAGNOSIS — I2510 Atherosclerotic heart disease of native coronary artery without angina pectoris: Secondary | ICD-10-CM | POA: Diagnosis not present

## 2022-10-01 DIAGNOSIS — Z923 Personal history of irradiation: Secondary | ICD-10-CM | POA: Diagnosis not present

## 2022-10-01 DIAGNOSIS — Z7901 Long term (current) use of anticoagulants: Secondary | ICD-10-CM | POA: Diagnosis not present

## 2022-10-01 DIAGNOSIS — I5022 Chronic systolic (congestive) heart failure: Secondary | ICD-10-CM | POA: Diagnosis not present

## 2022-10-01 DIAGNOSIS — D6869 Other thrombophilia: Secondary | ICD-10-CM | POA: Diagnosis not present
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
## 2022-10-06 ENCOUNTER — Ambulatory Visit
Admission: RE | Admit: 2022-10-06 | Discharge: 2022-10-06 | Disposition: A | Discharge status: Home / Self Care | Payer: 59 | Source: Ambulatory Visit | Attending: Physician Assistant | Admitting: Physician Assistant

## 2022-10-06 ENCOUNTER — Ambulatory Visit (INDEPENDENT_AMBULATORY_CARE_PROVIDER_SITE_OTHER): Payer: 59

## 2022-10-06 VITALS — BP 117/62 | HR 61 | Temp 98.6°F | Resp 17 | Wt 196.0 lb

## 2022-10-06 DIAGNOSIS — R059 Cough, unspecified: Secondary | ICD-10-CM

## 2022-10-06 DIAGNOSIS — H66001 Acute suppurative otitis media without spontaneous rupture of ear drum, right ear: Secondary | ICD-10-CM

## 2022-10-06 DIAGNOSIS — J441 Chronic obstructive pulmonary disease with (acute) exacerbation: Secondary | ICD-10-CM

## 2022-10-06 DIAGNOSIS — R051 Acute cough: Secondary | ICD-10-CM | POA: Diagnosis not present

## 2022-10-06 MED ORDER — PREDNISONE 20 MG PO TABS: 40.0000 mg | ORAL_TABLET | Freq: Every day | ORAL | 0 refills | Status: AC

## 2022-10-06 MED ORDER — BENZONATATE 200 MG PO CAPS: 200.0000 mg | ORAL_CAPSULE | Freq: Three times a day (TID) | ORAL | 0 refills | Status: DC | PRN

## 2022-10-06 MED ORDER — AZITHROMYCIN 250 MG PO TABS: 250.0000 mg | ORAL_TABLET | Freq: Every day | ORAL | 0 refills | Status: DC
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
## 2022-10-06 NOTE — ED Triage Notes (Addendum)
right ear clog, Headache, cough and cold x 2d  COVID a month ago

## 2022-10-12 ENCOUNTER — Other Ambulatory Visit: Payer: Self-pay | Admitting: Gastroenterology

## 2022-10-12 DIAGNOSIS — K7460 Unspecified cirrhosis of liver: Secondary | ICD-10-CM

## 2022-10-20 ENCOUNTER — Ambulatory Visit
Admission: RE | Admit: 2022-10-20 | Discharge: 2022-10-20 | Disposition: A | Discharge status: Home / Self Care | Payer: 59 | Source: Ambulatory Visit | Attending: Gastroenterology | Admitting: Gastroenterology

## 2022-10-20 DIAGNOSIS — K7460 Unspecified cirrhosis of liver: Secondary | ICD-10-CM

## 2022-11-11 ENCOUNTER — Ambulatory Visit: Payer: 59 | Admitting: Radiation Oncology

## 2022-11-18 ENCOUNTER — Encounter: Payer: Self-pay | Admitting: Nurse Practitioner

## 2022-11-18 ENCOUNTER — Ambulatory Visit: Payer: 59 | Attending: Nurse Practitioner | Admitting: Nurse Practitioner

## 2022-11-18 VITALS — BP 113/66 | HR 64 | Ht 66.0 in | Wt 195.8 lb

## 2022-11-18 DIAGNOSIS — I4819 Other persistent atrial fibrillation: Secondary | ICD-10-CM

## 2022-11-18 DIAGNOSIS — I2510 Atherosclerotic heart disease of native coronary artery without angina pectoris: Secondary | ICD-10-CM | POA: Diagnosis not present

## 2022-11-18 DIAGNOSIS — K7460 Unspecified cirrhosis of liver: Secondary | ICD-10-CM | POA: Diagnosis not present

## 2022-11-18 DIAGNOSIS — I5032 Chronic diastolic (congestive) heart failure: Secondary | ICD-10-CM

## 2022-11-18 DIAGNOSIS — E785 Hyperlipidemia, unspecified: Secondary | ICD-10-CM

## 2022-11-18 DIAGNOSIS — R188 Other ascites: Secondary | ICD-10-CM

## 2022-11-18 NOTE — Patient Instructions (Signed)
Medication Instructions:  No changes *If you need a refill on your cardiac medications before your next appointment, please call your pharmacy*   Lab Work: None ordered If you have labs (blood work) drawn today and your tests are completely normal, you will receive your results only by: MyChart Message (if you have MyChart) OR A paper copy in the mail If you have any lab test that is abnormal or we need to change your treatment, we will call you to review the results.   Testing/Procedures: None ordered   Follow-Up: At [facility], you and your health needs are our priority.  As part of our continuing mission to provide you with exceptional heart care, we have created designated Provider Care Teams.  These Care Teams include your primary Cardiologist (physician) and Advanced Practice Providers (APPs -  Physician Assistants and Nurse Practitioners) who all work together to provide you with the care you need, when you need it.  We recommend signing up for the patient portal called "MyChart".  Sign up information is provided on this After Visit Summary.  MyChart is used to connect with patients for Virtual Visits (Telemedicine).  Patients are able to view lab/test results, encounter notes, upcoming appointments, etc.  Non-urgent messages can be sent to your provider as well.   To learn more about what you can do with MyChart, go to [email or web address].    Your next appointment:   3 month(s)  Provider:   Steffanie Dunn, MD
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
## 2022-11-22 ENCOUNTER — Other Ambulatory Visit: Payer: Self-pay | Admitting: Nurse Practitioner

## 2022-11-23 ENCOUNTER — Ambulatory Visit: Payer: 59 | Admitting: Radiation Oncology

## 2022-11-24 ENCOUNTER — Ambulatory Visit: Payer: 59 | Admitting: Cardiology

## 2022-12-01 ENCOUNTER — Other Ambulatory Visit: Payer: Self-pay | Admitting: Internal Medicine

## 2022-12-05 DIAGNOSIS — F17200 Nicotine dependence, unspecified, uncomplicated: Secondary | ICD-10-CM | POA: Insufficient documentation

## 2022-12-27 ENCOUNTER — Other Ambulatory Visit: Payer: Self-pay | Admitting: Internal Medicine

## 2022-12-27 ENCOUNTER — Other Ambulatory Visit: Payer: Self-pay | Admitting: Oncology

## 2023-01-28 ENCOUNTER — Other Ambulatory Visit: Payer: Self-pay | Admitting: Internal Medicine

## 2023-01-28 DIAGNOSIS — I4819 Other persistent atrial fibrillation: Secondary | ICD-10-CM

## 2023-01-28 NOTE — Telephone Encounter (Signed)
Prescription refill request for Eliquis received. Indication:afib Last office visit:10/24 Scr:0.9  10/24 Age: 62 Weight:88.8  kg  Prescription refilled

## 2023-01-28 NOTE — Telephone Encounter (Signed)
Refill Request.  

## 2023-02-23 ENCOUNTER — Encounter: Payer: Self-pay | Admitting: Cardiology

## 2023-02-23 ENCOUNTER — Ambulatory Visit: Payer: 59 | Attending: Cardiology | Admitting: Cardiology

## 2023-02-23 VITALS — BP 128/78 | HR 70 | Ht 66.0 in | Wt 199.0 lb

## 2023-02-23 DIAGNOSIS — I5022 Chronic systolic (congestive) heart failure: Secondary | ICD-10-CM

## 2023-02-23 DIAGNOSIS — I4819 Other persistent atrial fibrillation: Secondary | ICD-10-CM | POA: Diagnosis not present

## 2023-02-23 NOTE — Progress Notes (Signed)
  Electrophysiology Office Follow up Visit Note:    Date:  02/23/2023   ID:  Ariana Riggs, DOB 06/03/1960, MRN [id number]  PCP:  Steva Clotilda DEL, NP  [facility] Cardiologist:  [name], MD  [facility] Electrophysiologist:  OLE ONEIDA HOLTS, MD    Interval History:     Ariana Riggs is a 63 y.o. female who presents for a follow up visit.   The patient had an A-fib ablation on August 20, 2022.  During the procedure the veins and posterior wall were ablated. She recently saw Medford Sayer on November 18, 2022.  At that appointment she reported maintaining sinus rhythm.    Today she tells me she is feeling well.  No recurrence of her arrhythmia.  She continues to take Eliquis  without bleeding issues.       Past medical, surgical, social and family history were reviewed.  ROS:   Please see the history of present illness.    All other systems reviewed and are negative.  EKGs/Labs/Other Studies Reviewed:    The following studies were reviewed today:     EKG Interpretation Date/Time:  Wednesday February 23 2023 15:04:46 EST Ventricular Rate:  70 PR Interval:  154 QRS Duration:  100 QT Interval:  466 QTC Calculation: 503 R Axis:   59  Text Interpretation: Normal sinus rhythm Incomplete right bundle branch block Confirmed by Holts Ole [phone]) on 02/23/2023 3:20:30 PM    Physical Exam:    VS:  BP 128/78 (BP Location: Left Arm, Patient Position: Sitting, Cuff Size: Large)   Pulse 70   Ht 5' 6 (1.676 m)   Wt 199 lb (90.3 kg)   LMP  (LMP Unknown)   BMI 32.12 kg/m     Wt Readings from Last 3 Encounters:  02/23/23 199 lb (90.3 kg)  11/18/22 195 lb 12.8 oz (88.8 kg)  10/06/22 196 lb (88.9 kg)     GEN: no distress CARD: RRR, No MRG RESP: No IWOB. CTAB.      ASSESSMENT:    1. Persistent atrial fibrillation (HCC)   2. Chronic HFrEF (heart failure with reduced ejection fraction) (HCC)    PLAN:    In order of problems listed  above:  #Persistent atrial fibrillation Maintaining sinus rhythm after her July catheter ablation Continue anticoagulation  #Chronic systolic heart failure Improved ejection fraction.  NYHA class II.  Continue GDMT.  Follow-up 1 year with APP.   Signed, Ole Holts, MD, [facility], [facility] 02/23/2023 3:27 PM    Electrophysiology [facility]

## 2023-02-23 NOTE — Patient Instructions (Signed)
 Medication Instructions:  Your physician recommends that you continue on your current medications as directed. Please refer to the Current Medication list given to you today.  *If you need a refill on your cardiac medications before your next appointment, please call your pharmacy*  Follow-Up: At [facility], you and your health needs are our priority.  As part of our continuing mission to provide you with exceptional heart care, we have created designated Provider Care Teams.  These Care Teams include your primary Cardiologist (physician) and Advanced Practice Providers (APPs -  Physician Assistants and Nurse Practitioners) who all work together to provide you with the care you need, when you need it.   Your next appointment:   1 year  Provider:   You may see Lanier Prude, MD or one of the following Advanced Practice Providers on your designated Care Team:   Francis Dowse, [place] [address] [place], [place] Sherie Don, NP Canary Brim, NP

## 2023-03-07 ENCOUNTER — Telehealth: Payer: Self-pay | Admitting: Internal Medicine

## 2023-03-07 NOTE — Telephone Encounter (Signed)
   Pre-operative Risk Assessment    Patient Name: Ariana Riggs  DOB: 09-Oct-1960 MRN: [id number]   Date of last office visit: 02/23/23 Dr Lalla Brothers, 11/18/22 C. Br[facility] Aliment Date of next office visit: 05/12/23 [facility]   Request for Surgical Clearance    Procedure:   Colonoscopy and upper endoscopy   Date of Surgery:  Clearance 05/27/23                                Surgeon:  Dr Eather Colas Surgeon's Group or Practice Name:  [facility] Phone number:  [phone] Fax number:  [phone]   Type of Clearance Requested:   - Pharmacy:  Hold Apixaban (Eliquis) instructions   Type of Anesthesia:  Not Indicated   Additional requests/questions:    Courtney Heys   03/07/2023, 12:03 PM

## 2023-03-08 ENCOUNTER — Inpatient Hospital Stay (HOSPITAL_BASED_OUTPATIENT_CLINIC_OR_DEPARTMENT_OTHER): Payer: 59 | Admitting: Oncology

## 2023-03-08 ENCOUNTER — Inpatient Hospital Stay: Payer: 59 | Attending: Oncology

## 2023-03-08 ENCOUNTER — Encounter: Payer: Self-pay | Admitting: Oncology

## 2023-03-08 VITALS — BP 129/112 | HR 78 | Temp 97.8°F | Wt 201.0 lb

## 2023-03-08 DIAGNOSIS — Z79811 Long term (current) use of aromatase inhibitors: Secondary | ICD-10-CM | POA: Insufficient documentation

## 2023-03-08 DIAGNOSIS — M85852 Other specified disorders of bone density and structure, left thigh: Secondary | ICD-10-CM | POA: Insufficient documentation

## 2023-03-08 DIAGNOSIS — Z08 Encounter for follow-up examination after completed treatment for malignant neoplasm: Secondary | ICD-10-CM | POA: Diagnosis not present

## 2023-03-08 DIAGNOSIS — Z79899 Other long term (current) drug therapy: Secondary | ICD-10-CM | POA: Diagnosis not present

## 2023-03-08 DIAGNOSIS — Z853 Personal history of malignant neoplasm of breast: Secondary | ICD-10-CM

## 2023-03-08 DIAGNOSIS — Z1731 Human epidermal growth factor receptor 2 positive status: Secondary | ICD-10-CM | POA: Insufficient documentation

## 2023-03-08 DIAGNOSIS — Z1721 Progesterone receptor positive status: Secondary | ICD-10-CM | POA: Insufficient documentation

## 2023-03-08 DIAGNOSIS — Z170 Estrogen receptor positive status [ER+]: Secondary | ICD-10-CM | POA: Diagnosis present

## 2023-03-08 DIAGNOSIS — F17210 Nicotine dependence, cigarettes, uncomplicated: Secondary | ICD-10-CM | POA: Insufficient documentation

## 2023-03-08 DIAGNOSIS — C50412 Malignant neoplasm of upper-outer quadrant of left female breast: Secondary | ICD-10-CM

## 2023-03-08 DIAGNOSIS — Z923 Personal history of irradiation: Secondary | ICD-10-CM | POA: Insufficient documentation

## 2023-03-08 LAB — CBC WITH DIFFERENTIAL/PLATELET
Abs Immature Granulocytes: 0.03 10*3/uL (ref 0.00–0.07)
Basophils Absolute: 0 10*3/uL (ref 0.0–0.1)
Basophils Relative: 0 %
Eosinophils Absolute: 0.1 10*3/uL (ref 0.0–0.5)
Eosinophils Relative: 1 %
HCT: 38 % (ref 36.0–46.0)
Hemoglobin: 13.7 g/dL (ref 12.0–15.0)
Immature Granulocytes: 1 %
Lymphocytes Relative: 25 %
Lymphs Abs: 1.3 10*3/uL (ref 0.7–4.0)
MCH: 34.4 pg — ABNORMAL HIGH (ref 26.0–34.0)
MCHC: 36.1 g/dL — ABNORMAL HIGH (ref 30.0–36.0)
MCV: 95.5 fL (ref 80.0–100.0)
Monocytes Absolute: 0.5 10*3/uL (ref 0.1–1.0)
Monocytes Relative: 9 %
Neutro Abs: 3.4 10*3/uL (ref 1.7–7.7)
Neutrophils Relative %: 64 %
Platelets: 138 10*3/uL — ABNORMAL LOW (ref 150–400)
RBC: 3.98 MIL/uL (ref 3.87–5.11)
RDW: 15.5 % (ref 11.5–15.5)
WBC: 5.3 10*3/uL (ref 4.0–10.5)
nRBC: 0 % (ref 0.0–0.2)

## 2023-03-08 LAB — COMPREHENSIVE METABOLIC PANEL
ALT: 20 U/L (ref 0–44)
AST: 28 U/L (ref 15–41)
Albumin: 4.5 g/dL (ref 3.5–5.0)
Alkaline Phosphatase: 129 U/L — ABNORMAL HIGH (ref 38–126)
Anion gap: 11 (ref 5–15)
BUN: 17 mg/dL (ref 8–23)
CO2: 23 mmol/L (ref 22–32)
Calcium: 9.3 mg/dL (ref 8.9–10.3)
Chloride: 102 mmol/L (ref 98–111)
Creatinine, Ser: 0.72 mg/dL (ref 0.44–1.00)
GFR, Estimated: >60 mL/min (ref >60)
Glucose, Bld: 183 mg/dL — ABNORMAL HIGH (ref 70–99)
Potassium: 4.6 mmol/L (ref 3.5–5.1)
Sodium: 136 mmol/L (ref 135–145)
Total Bilirubin: 1.1 mg/dL (ref 0.0–1.2)
Total Protein: 8.1 g/dL (ref 6.5–8.1)

## 2023-03-08 NOTE — Telephone Encounter (Signed)
Patient with diagnosis of A Fib on Eliquis for anticoagulation.    Procedure:  Colonoscopy and upper endoscopy  Date of procedure: 05/27/23   CHA2DS2-VASc Score = 4  This indicates a 4.8% annual risk of stroke. The patient's score is based upon: CHF History: 1 HTN History: 0 Diabetes History: 1 Stroke History: 0 Vascular Disease History: 1 Age Score: 0 Gender Score: 1   CrCl 115 ml/min Platelet count 138K  Per office protocol, patient can hold Eliquis for 2 days prior to procedure.    **This guidance is not considered finalized until pre-operative APP has relayed final recommendations.**
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
## 2023-03-08 NOTE — Telephone Encounter (Signed)
   Patient Name: Ariana Riggs  DOB: 07/03/1960 MRN: [id number]  Primary Cardiologist: Yvonne Kendall, MD  Clinical pharmacists have reviewed the patient's past medical history, labs, and current medications as part of preoperative protocol coverage. The following recommendations have been made:   Patient with diagnosis of A Fib on Eliquis for anticoagulation.     Procedure:  Colonoscopy and upper endoscopy  Date of procedure: 05/27/23     CHA2DS2-VASc Score = 4  This indicates a 4.8% annual risk of stroke. The patient's score is based upon: CHF History: 1 HTN History: 0 Diabetes History: 1 Stroke History: 0 Vascular Disease History: 1 Age Score: 0 Gender Score: 1     CrCl 115 ml/min Platelet count 138K   Per office protocol, patient can hold Eliquis for 2 days prior to procedure.  Please resume Eliquis as soon as possible postprocedure, at the discretion of the surgeon.    I will route this recommendation to the requesting party via Epic fax function and remove from pre-op pool.  Please call with questions.  Joylene Grapes, NP 03/08/2023, 2:41 PM

## 2023-03-11 ENCOUNTER — Other Ambulatory Visit: Payer: Self-pay | Admitting: Gastroenterology

## 2023-03-11 DIAGNOSIS — K7460 Unspecified cirrhosis of liver: Secondary | ICD-10-CM

## 2023-03-15 ENCOUNTER — Other Ambulatory Visit: Payer: Self-pay | Admitting: Oncology

## 2023-03-15 DIAGNOSIS — C50412 Malignant neoplasm of upper-outer quadrant of left female breast: Secondary | ICD-10-CM

## 2023-03-15 DIAGNOSIS — N63 Unspecified lump in unspecified breast: Secondary | ICD-10-CM

## 2023-03-15 DIAGNOSIS — Z08 Encounter for follow-up examination after completed treatment for malignant neoplasm: Secondary | ICD-10-CM

## 2023-03-29 ENCOUNTER — Ambulatory Visit
Admission: RE | Admit: 2023-03-29 | Discharge: 2023-03-29 | Disposition: A | Discharge status: Home / Self Care | Payer: 59 | Source: Ambulatory Visit | Attending: Gastroenterology | Admitting: Gastroenterology

## 2023-03-29 DIAGNOSIS — K7460 Unspecified cirrhosis of liver: Secondary | ICD-10-CM

## 2023-04-02 ENCOUNTER — Other Ambulatory Visit: Payer: Self-pay | Admitting: Internal Medicine

## 2023-04-11 ENCOUNTER — Telehealth: Payer: Self-pay | Admitting: Internal Medicine

## 2023-04-11 NOTE — H&P (View-Only) (Signed)
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 at the lower left sternal border.     No friction rub.  Pulmonary:     Effort: Pulmonary effort is normal. No respiratory distress.     Breath sounds: Normal breath sounds. No decreased breath sounds, wheezing, rhonchi or rales.  Chest:     Chest wall: No tenderness.  Musculoskeletal:     Cervical back: Normal range of motion.     Right lower leg: No edema.     Left lower leg: No edema.  Skin:    General: Skin is warm and dry.     Nails: There is no clubbing.  Neurological:     Mental Status: She is alert and oriented to person, place, and time.  Psychiatric:        Speech: Speech normal.        Behavior: Behavior normal.        Thought Content: Thought content normal.        Judgment: Judgment normal.     Wt Readings from Last 3 Encounters:  04/12/23 205 lb (93 kg)  03/08/23 201 lb (91.2 kg)  02/23/23 199 lb (90.3 kg)     ASSESSMENT & PLAN:   CAD involving native  coronary arteries with unstable angina: Currently without symptoms of angina.  Symptoms are concerning for unstable angina and consistent with prior angina leading up to PCI.  Schedule R/LHC.  She will need to hold apixaban for 2 days prior to cardiac cath.  While off apixaban recommend she take aspirin 81 mg daily.  She will otherwise continue atorvastatin 10 mg and Toprol-XL 50 mg twice daily.  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  Assess hemodynamic status on RHC.  For now, she remains on furosemide 80 mg twice daily, losartan 12.5 mg daily, Toprol-XL 50 mg twice daily, and spironolactone 50 mg daily.  Based on echo and cath findings consider further escalation of GDMT as tolerated.  Persistent A-fib: Maintaining sinus rhythm sta unstable angina tus post catheter ablation and on Toprol-XL 50 mg twice daily.  CHA2DS2-VASc at least 3 (CHF, vascular disease, sex category).  Remains on apixaban 5 mg twice daily and does not meet reduced dosing criteria.  Will need to hold apixaban for 2 days prior to cardiac cath.  No falls or symptoms concerning for bleeding.  Check CBC and BMP.  No longer on amiodarone in the setting of prior hyperthyroidism.  Followed by EP.  Mitral regurgitation: Stable moderate to severe mitral regurgitation by echo in 2023, dating back to 2020.  Cardiac cath as outlined above.  Update echo.  HLD: LDL 40 in 11/2022.  Remains on atorvastatin 10 mg.  Abnormal chest CT: Follow-up deferred to ordering provider.  Hepatic cirrhosis: Assess hemodynamic status on cardiac cath as outlined above.  LFTs normal last month.  Followed by hepatology.   Informed Consent   Shared Decision Making/Informed Consent{  The risks [stroke (1 in 1000), death (1 in 1000), kidney failure [usually temporary] (1 in 500),  bleeding (1 in 200), allergic reaction [possibly serious] (1 in 200)], benefits  (diagnostic support and management of coronary artery disease) and alternatives of a cardiac catheterization were discussed in detail with Ms. Pizano and she is willing to proceed.        Disposition: F/u with Dr. Okey Dupre or an APP in 1 month, and EP as directed.    Medication Adjustments/Labs and Tests Ordered: Current medicines are reviewed at length with the patient today.  Concerns regarding medicines are outlined above. Medication changes, Labs and Tests ordered today are summarized above and listed in the Patient Instructions accessible in Encounters.   Signed, Eula Listen, PA-C 04/12/2023 5:36 PM     [facility] [address] [place], [place] [address] [phone]

## 2023-04-11 NOTE — Progress Notes (Unsigned)
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 the lower left sternal border.     No friction rub.  Pulmonary:     Effort: Pulmonary effort is normal. No respiratory distress.     Breath sounds: Normal breath sounds. No decreased breath sounds, wheezing, rhonchi or rales.  Chest:     Chest wall: No tenderness.  Musculoskeletal:     Cervical back: Normal range of motion.     Right lower leg: No edema.     Left lower leg: No edema.  Skin:    General: Skin is warm and dry.     Nails: There is no clubbing.  Neurological:     Mental Status: She is alert and oriented to person, place, and time.  Psychiatric:        Speech: Speech normal.        Behavior: Behavior normal.        Thought Content: Thought content normal.        Judgment: Judgment normal.     Wt Readings from Last 3 Encounters:  04/12/23 205 lb (93 kg)  03/08/23 201 lb (91.2 kg)  02/23/23 199 lb (90.3 kg)     ASSESSMENT & PLAN:   CAD involving native  coronary arteries with unstable angina: Currently without symptoms of angina.  Symptoms are concerning for unstable angina and consistent with prior angina leading up to PCI.  Schedule R/LHC.  She will need to hold apixaban for 2 days prior to cardiac cath.  While off apixaban recommend she take aspirin 81 mg daily.  She will otherwise continue atorvastatin 10 mg and Toprol-XL 50 mg twice daily.  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  Assess hemodynamic status on RHC.  For now, she remains on furosemide 80 mg twice daily, losartan 12.5 mg daily, Toprol-XL 50 mg twice daily, and spironolactone 50 mg daily.  Based on echo and cath findings consider further escalation of GDMT as tolerated.  Persistent A-fib: Maintaining sinus rhythm sta unstable angina tus post catheter ablation and on Toprol-XL 50 mg twice daily.  CHA2DS2-VASc at least 3 (CHF, vascular disease, sex category).  Remains on apixaban 5 mg twice daily and does not meet reduced dosing criteria.  Will need to hold apixaban for 2 days prior to cardiac cath.  No falls or symptoms concerning for bleeding.  Check CBC and BMP.  No longer on amiodarone in the setting of prior hyperthyroidism.  Followed by EP.  Mitral regurgitation: Stable moderate to severe mitral regurgitation by echo in 2023, dating back to 2020.  Cardiac cath as outlined above.  Update echo.  HLD: LDL 40 in 11/2022.  Remains on atorvastatin 10 mg.  Abnormal chest CT: Follow-up deferred to ordering provider.  Hepatic cirrhosis: Assess hemodynamic status on cardiac cath as outlined above.  LFTs normal last month.  Followed by hepatology.   Informed Consent   Shared Decision Making/Informed Consent{  The risks [stroke (1 in 1000), death (1 in 1000), kidney failure [usually temporary] (1 in 500),  bleeding (1 in 200), allergic reaction [possibly serious] (1 in 200)], benefits  (diagnostic support and management of coronary artery disease) and alternatives of a cardiac catheterization were discussed in detail with Ms. Trostel and she is willing to proceed.        Disposition: F/u with Dr. Okey Dupre or an APP in 1 month, and EP as directed.    Medication Adjustments/Labs and Tests Ordered: Current medicines are reviewed at length with the patient today.  Concerns regarding medicines are outlined above. Medication changes, Labs and Tests ordered today are summarized above and listed in the Patient Instructions accessible in Encounters.   Signed, Eula Listen, PA-C 04/12/2023 5:36 PM     [facility] [place] [address] [place], [place] [address] [phone]

## 2023-04-11 NOTE — Telephone Encounter (Signed)
 Pt c/o Shortness Of Breath: STAT if SOB developed within the last 24 hours or pt is noticeably SOB on the phone  1. Are you currently SOB (can you hear that pt is SOB on the phone)? Yes, due to conversation cannot hear over the phone.   2. How long have you been experiencing SOB? 2-3 weeks   3. Are you SOB when sitting or when up moving around? Mainly when moving around at all, only time it occurs when sitting is when having a long conversation.  4. Are you currently experiencing any other symptoms? Pain in legs, two angina spells in past couple months.   States the last time angina occurred was last week. Does not have any Nitroglycerin.   03/27 appt moved up to tomorrow due to her being unavailable for original date in addition to symptoms.

## 2023-04-11 NOTE — Telephone Encounter (Signed)
 Called patient back about her message. Patient has been having SOB, but has gotten worse over the last several days, and husband made her call our office. Patient has office visit tomorrow. Patient stated it the last couple of months she has had chest pressure, but none today. Encouraged patient to go to the ED if SOB gets worse and she has chest pressure as well. Patient stated she thinks she is fine to wait until tomorrow to see someone.

## 2023-04-12 ENCOUNTER — Encounter: Payer: Self-pay | Admitting: Physician Assistant

## 2023-04-12 ENCOUNTER — Ambulatory Visit: Payer: 59 | Attending: Physician Assistant | Admitting: Physician Assistant

## 2023-04-12 VITALS — BP 120/60 | HR 72 | Ht 66.0 in | Wt 205.0 lb

## 2023-04-12 DIAGNOSIS — I5032 Chronic diastolic (congestive) heart failure: Secondary | ICD-10-CM | POA: Diagnosis not present

## 2023-04-12 DIAGNOSIS — I4819 Other persistent atrial fibrillation: Secondary | ICD-10-CM | POA: Diagnosis not present

## 2023-04-12 DIAGNOSIS — R0609 Other forms of dyspnea: Secondary | ICD-10-CM

## 2023-04-12 DIAGNOSIS — I200 Unstable angina: Secondary | ICD-10-CM | POA: Insufficient documentation

## 2023-04-12 DIAGNOSIS — E785 Hyperlipidemia, unspecified: Secondary | ICD-10-CM

## 2023-04-12 DIAGNOSIS — I25110 Atherosclerotic heart disease of native coronary artery with unstable angina pectoris: Secondary | ICD-10-CM | POA: Diagnosis not present

## 2023-04-12 DIAGNOSIS — I340 Nonrheumatic mitral (valve) insufficiency: Secondary | ICD-10-CM

## 2023-04-12 MED ORDER — ASPIRIN 81 MG PO TBEC
81.0000 mg | DELAYED_RELEASE_TABLET | Freq: Every day | ORAL | Status: DC
Start: 2023-04-13 — End: 2023-04-16

## 2023-04-12 NOTE — Patient Instructions (Signed)
 Medication Instructions:  Your physician recommends the following medication changes.  STOP TAKING: Eliquis today until your cath on 2/28 then resume  START TAKING: Aspirin 81 mg daily on 2/26-2/28 then stop   *If you need a refill on your cardiac medications before your next appointment, please call your pharmacy*   Lab Work: Your provider would like for you to have following labs drawn today BMeT and CBC.   If you have labs (blood work) drawn today and your tests are completely normal, you will receive your results only by: MyChart Message (if you have MyChart) OR A paper copy in the mail If you have any lab test that is abnormal or we need to change your treatment, we will call you to review the results.   Testing/Procedures:  [place] A DEPT OF . [facility] AT [place] [address] [place] [address] Dept: [phone] Loc: [phone]  Ariana Riggs  04/12/2023  You are scheduled for a Cardiac Catheterization on Friday, February 28 with Dr. Cristal Deer End.  1. Please arrive at the [facility] Entrance of [facility], [address], [place] [address] at 11:00 AM (This is 1 hour(s) prior to your procedure time).  Proceed to the Check-In Desk directly inside the entrance.  Procedure Parking: Use the entrance off of the [address] side of the hospital. Turn right upon entering and follow the driveway to parking that is directly in front of the Heart & Vascular Center. There is no valet parking available at this entrance, however there is an awning directly in front of the Heart & Vascular Center for drop off/ pick up for patients.  Special note: Every effort is made to have your procedure done on time. Please understand that emergencies sometimes delay scheduled procedures.  2. Diet: Do not eat solid foods after midnight.  The patient may have clear liquids until 5am upon  the day of the procedure.  3. Labs: You will need to have blood drawn today.  4. Medication instructions in preparation for your procedure:   Contrast Allergy: No   Stop taking Eliquis (Apixiban) on Tuesday, February 25.  Stop taking, Lasix (Furosemide)  and Spironolactone Friday, February 28,  On the morning of your procedure, take your Aspirin 81 mg.  You may use sips of water.  5. Plan to go home the same day, you will only stay overnight if medically necessary. 6. Bring a current list of your medications and current insurance cards. 7. You MUST have a responsible person to drive you home. 8. Someone MUST be with you the first 24 hours after you arrive home or your discharge will be delayed. 9. Please wear clothes that are easy to get on and off and wear slip-on shoes.  Thank you for allowing [place] to care for you!   -- [facility]   Your physician has requested that you have an echocardiogram. Echocardiography is a painless test that uses sound waves to create images of your heart. It provides your doctor with information about the size and shape of your heart and how well your heart's chambers and valves are working.   You may receive an ultrasound enhancing agent through an IV if needed to better visualize your heart during the echo. This procedure takes approximately one hour.  There are no restrictions for this procedure.  This will take place at [address], [place] [address]  Please note: We ask at that you not bring children with you during ultrasound (echo/ vascular) testing. Due to room size and safety concerns, children are not allowed in the ultrasound rooms during exams. Our front office staff cannot provide observation of children in our lobby area while testing is being conducted. An adult accompanying a patient to their appointment will only be allowed in the ultrasound room at the discretion of the  ultrasound technician under special circumstances. We apologize for any inconvenience.   Follow-Up: At [facility], you and your health needs are our priority.  As part of our continuing mission to provide you with exceptional heart care, we have created designated Provider Care Teams.  These Care Teams include your primary Cardiologist (physician) and Advanced Practice Providers (APPs -  Physician Assistants and Nurse Practitioners) who all work together to provide you with the care you need, when you need it.  Your next appointment:   1 month(s)  Provider:   You may see Yvonne Kendall, MD or one of the following Advanced Practice Providers on your designated Care Team:   Eula Listen, [place]

## 2023-04-13 ENCOUNTER — Other Ambulatory Visit: Payer: Self-pay | Admitting: Oncology

## 2023-04-13 ENCOUNTER — Ambulatory Visit
Admission: RE | Admit: 2023-04-13 | Discharge: 2023-04-13 | Disposition: A | Discharge status: Home / Self Care | Payer: 59 | Source: Ambulatory Visit | Attending: Oncology | Admitting: Oncology

## 2023-04-13 DIAGNOSIS — Z853 Personal history of malignant neoplasm of breast: Secondary | ICD-10-CM | POA: Insufficient documentation

## 2023-04-13 DIAGNOSIS — Z170 Estrogen receptor positive status [ER+]: Secondary | ICD-10-CM | POA: Diagnosis present

## 2023-04-13 DIAGNOSIS — Z08 Encounter for follow-up examination after completed treatment for malignant neoplasm: Secondary | ICD-10-CM | POA: Insufficient documentation

## 2023-04-13 DIAGNOSIS — C50412 Malignant neoplasm of upper-outer quadrant of left female breast: Secondary | ICD-10-CM

## 2023-04-13 DIAGNOSIS — N63 Unspecified lump in unspecified breast: Secondary | ICD-10-CM | POA: Insufficient documentation

## 2023-04-13 DIAGNOSIS — R928 Other abnormal and inconclusive findings on diagnostic imaging of breast: Secondary | ICD-10-CM

## 2023-04-13 LAB — CBC
Hematocrit: 40.8 % (ref 34.0–46.6)
Hemoglobin: 14.1 g/dL (ref 11.1–15.9)
MCH: 34.1 pg — ABNORMAL HIGH (ref 26.6–33.0)
MCHC: 34.6 g/dL (ref 31.5–35.7)
MCV: 99 fL — ABNORMAL HIGH (ref 79–97)
Platelets: 187 10*3/uL (ref 150–450)
RBC: 4.14 x10E6/uL (ref 3.77–5.28)
RDW: 15 % (ref 11.7–15.4)
WBC: 5.8 10*3/uL (ref 3.4–10.8)

## 2023-04-13 LAB — BASIC METABOLIC PANEL
BUN/Creatinine Ratio: 22 (ref 12–28)
BUN: 20 mg/dL (ref 8–27)
CO2: 26 mmol/L (ref 20–29)
Calcium: 9.6 mg/dL (ref 8.7–10.3)
Chloride: 98 mmol/L (ref 96–106)
Creatinine, Ser: 0.91 mg/dL (ref 0.57–1.00)
Glucose: 93 mg/dL (ref 70–99)
Potassium: 4.3 mmol/L (ref 3.5–5.2)
Sodium: 142 mmol/L (ref 134–144)
eGFR: 71 mL/min/{1.73_m2} (ref >59)

## 2023-04-15 ENCOUNTER — Encounter: Payer: Self-pay | Admitting: Internal Medicine

## 2023-04-15 ENCOUNTER — Observation Stay
Admission: RE | Admit: 2023-04-15 | Discharge: 2023-04-16 | Disposition: A | Discharge status: Home / Self Care | Payer: 59 | Attending: Internal Medicine | Admitting: Internal Medicine

## 2023-04-15 ENCOUNTER — Other Ambulatory Visit: Payer: Self-pay

## 2023-04-15 ENCOUNTER — Encounter
Admission: RE | Disposition: A | Discharge status: Home / Self Care | Payer: Self-pay | Source: Home / Self Care | Attending: Internal Medicine

## 2023-04-15 DIAGNOSIS — I25110 Atherosclerotic heart disease of native coronary artery with unstable angina pectoris: Principal | ICD-10-CM

## 2023-04-15 DIAGNOSIS — I428 Other cardiomyopathies: Secondary | ICD-10-CM | POA: Insufficient documentation

## 2023-04-15 DIAGNOSIS — Z955 Presence of coronary angioplasty implant and graft: Secondary | ICD-10-CM | POA: Diagnosis not present

## 2023-04-15 DIAGNOSIS — N183 Chronic kidney disease, stage 3 unspecified: Secondary | ICD-10-CM | POA: Diagnosis not present

## 2023-04-15 DIAGNOSIS — Z853 Personal history of malignant neoplasm of breast: Secondary | ICD-10-CM | POA: Insufficient documentation

## 2023-04-15 DIAGNOSIS — E1122 Type 2 diabetes mellitus with diabetic chronic kidney disease: Secondary | ICD-10-CM | POA: Diagnosis not present

## 2023-04-15 DIAGNOSIS — I200 Unstable angina: Principal | ICD-10-CM

## 2023-04-15 DIAGNOSIS — F17210 Nicotine dependence, cigarettes, uncomplicated: Secondary | ICD-10-CM | POA: Diagnosis not present

## 2023-04-15 DIAGNOSIS — I5030 Unspecified diastolic (congestive) heart failure: Secondary | ICD-10-CM | POA: Diagnosis not present

## 2023-04-15 DIAGNOSIS — J45909 Unspecified asthma, uncomplicated: Secondary | ICD-10-CM | POA: Diagnosis not present

## 2023-04-15 DIAGNOSIS — Z79899 Other long term (current) drug therapy: Secondary | ICD-10-CM | POA: Insufficient documentation

## 2023-04-15 DIAGNOSIS — Z8616 Personal history of COVID-19: Secondary | ICD-10-CM | POA: Diagnosis not present

## 2023-04-15 DIAGNOSIS — J449 Chronic obstructive pulmonary disease, unspecified: Secondary | ICD-10-CM | POA: Insufficient documentation

## 2023-04-15 DIAGNOSIS — I5032 Chronic diastolic (congestive) heart failure: Secondary | ICD-10-CM | POA: Insufficient documentation

## 2023-04-15 DIAGNOSIS — I4819 Other persistent atrial fibrillation: Secondary | ICD-10-CM | POA: Diagnosis not present

## 2023-04-15 DIAGNOSIS — Z7901 Long term (current) use of anticoagulants: Secondary | ICD-10-CM | POA: Diagnosis not present

## 2023-04-15 LAB — POCT I-STAT EG7
Acid-Base Excess: 4 mmol/L — ABNORMAL HIGH (ref 0.0–2.0)
Bicarbonate: 28.7 mmol/L — ABNORMAL HIGH (ref 20.0–28.0)
Calcium, Ion: 1.19 mmol/L (ref 1.15–1.40)
HCT: 35 % — ABNORMAL LOW (ref 36.0–46.0)
Hemoglobin: 11.9 g/dL — ABNORMAL LOW (ref 12.0–15.0)
O2 Saturation: 65 %
Potassium: 3.7 mmol/L (ref 3.5–5.1)
Sodium: 142 mmol/L (ref 135–145)
TCO2: 30 mmol/L (ref 22–32)
pCO2, Ven: 44.5 mm[Hg] (ref 44–60)
pH, Ven: 7.418 (ref 7.25–7.43)
pO2, Ven: 33 mm[Hg] (ref 32–45)

## 2023-04-15 LAB — GLUCOSE, CAPILLARY: Glucose-Capillary: 140 mg/dL — ABNORMAL HIGH (ref 70–99)

## 2023-04-15 LAB — POCT I-STAT 7, (LYTES, BLD GAS, ICA,H+H)
Acid-Base Excess: 5 mmol/L — ABNORMAL HIGH (ref 0.0–2.0)
Bicarbonate: 28.9 mmol/L — ABNORMAL HIGH (ref 20.0–28.0)
Calcium, Ion: 1.18 mmol/L (ref 1.15–1.40)
HCT: 35 % — ABNORMAL LOW (ref 36.0–46.0)
Hemoglobin: 11.9 g/dL — ABNORMAL LOW (ref 12.0–15.0)
O2 Saturation: 92 %
Potassium: 3.8 mmol/L (ref 3.5–5.1)
Sodium: 141 mmol/L (ref 135–145)
TCO2: 30 mmol/L (ref 22–32)
pCO2 arterial: 39.3 mm[Hg] (ref 32–48)
pH, Arterial: 7.475 — ABNORMAL HIGH (ref 7.35–7.45)
pO2, Arterial: 58 mm[Hg] — ABNORMAL LOW (ref 83–108)

## 2023-04-15 LAB — POCT ACTIVATED CLOTTING TIME
Activated Clotting Time: 268 s
Activated Clotting Time: 285 s

## 2023-04-15 SURGERY — RIGHT/LEFT HEART CATH AND CORONARY ANGIOGRAPHY
Anesthesia: Moderate Sedation

## 2023-04-15 MED ORDER — PANTOPRAZOLE SODIUM 20 MG PO TBEC
20.0000 mg | DELAYED_RELEASE_TABLET | Freq: Every day | ORAL | Status: DC
  Administered 2023-04-16: 20 mg via ORAL
  Filled 2023-04-15: qty 1

## 2023-04-15 MED ORDER — FENTANYL CITRATE (PF) 100 MCG/2ML IJ SOLN
INTRAMUSCULAR | Status: AC
  Filled 2023-04-15: qty 2

## 2023-04-15 MED ORDER — SODIUM CHLORIDE 0.9% FLUSH
3.0000 mL | Freq: Two times a day (BID) | INTRAVENOUS | Status: DC
  Administered 2023-04-15 – 2023-04-16 (×2): 3 mL via INTRAVENOUS

## 2023-04-15 MED ORDER — ALBUTEROL SULFATE (2.5 MG/3ML) 0.083% IN NEBU: 2.5000 mg | INHALATION_SOLUTION | Freq: Four times a day (QID) | RESPIRATORY_TRACT | Status: DC | PRN

## 2023-04-15 MED ORDER — ACETAMINOPHEN 325 MG PO TABS: 650.0000 mg | ORAL_TABLET | ORAL | Status: DC | PRN

## 2023-04-15 MED ORDER — SPIRONOLACTONE 25 MG PO TABS
50.0000 mg | ORAL_TABLET | Freq: Every day | ORAL | Status: DC
  Administered 2023-04-16: 50 mg via ORAL
  Filled 2023-04-15: qty 2

## 2023-04-15 MED ORDER — SODIUM CHLORIDE 0.9 % WEIGHT BASED INFUSION
1.0000 mL/kg/h | INTRAVENOUS | Status: DC
  Administered 2023-04-15: 1 mL/kg/h via INTRAVENOUS

## 2023-04-15 MED ORDER — HEPARIN SODIUM (PORCINE) 1000 UNIT/ML IJ SOLN
INTRAMUSCULAR | Status: AC
Start: 2023-04-15 — End: ?
  Filled 2023-04-15: qty 10

## 2023-04-15 MED ORDER — POTASSIUM CHLORIDE CRYS ER 20 MEQ PO TBCR
40.0000 meq | EXTENDED_RELEASE_TABLET | Freq: Every day | ORAL | Status: DC
  Administered 2023-04-16: 40 meq via ORAL
  Filled 2023-04-15: qty 2

## 2023-04-15 MED ORDER — HEPARIN (PORCINE) IN NACL 2000-0.9 UNIT/L-% IV SOLN
INTRAVENOUS | Status: DC | PRN
  Administered 2023-04-15: 1000 mL

## 2023-04-15 MED ORDER — ASPIRIN 81 MG PO TBEC
81.0000 mg | DELAYED_RELEASE_TABLET | Freq: Every day | ORAL | Status: DC
  Administered 2023-04-16: 81 mg via ORAL
  Filled 2023-04-15: qty 1

## 2023-04-15 MED ORDER — VERAPAMIL HCL 2.5 MG/ML IV SOLN
INTRAVENOUS | Status: AC
  Filled 2023-04-15: qty 2

## 2023-04-15 MED ORDER — ANASTROZOLE 1 MG PO TABS
1.0000 mg | ORAL_TABLET | Freq: Every day | ORAL | Status: DC
  Administered 2023-04-16: 1 mg via ORAL
  Filled 2023-04-15: qty 1

## 2023-04-15 MED ORDER — CLOPIDOGREL BISULFATE 75 MG PO TABS
ORAL_TABLET | ORAL | Status: DC | PRN
  Administered 2023-04-15: 600 mg via ORAL

## 2023-04-15 MED ORDER — ASPIRIN 81 MG PO CHEW
81.0000 mg | CHEWABLE_TABLET | ORAL | Status: DC
Start: 2023-04-16 — End: 2023-04-15

## 2023-04-15 MED ORDER — HEPARIN (PORCINE) IN NACL 1000-0.9 UT/500ML-% IV SOLN
INTRAVENOUS | Status: AC
  Filled 2023-04-15: qty 1000

## 2023-04-15 MED ORDER — CITALOPRAM HYDROBROMIDE 20 MG PO TABS
40.0000 mg | ORAL_TABLET | Freq: Every day | ORAL | Status: DC
Start: 2023-04-16 — End: 2023-04-16
  Administered 2023-04-16: 40 mg via ORAL
  Filled 2023-04-15: qty 2

## 2023-04-15 MED ORDER — TRAZODONE HCL 50 MG PO TABS
50.0000 mg | ORAL_TABLET | Freq: Every day | ORAL | Status: DC
  Administered 2023-04-15: 50 mg via ORAL
  Filled 2023-04-15: qty 1

## 2023-04-15 MED ORDER — METOPROLOL SUCCINATE ER 50 MG PO TB24
50.0000 mg | ORAL_TABLET | Freq: Two times a day (BID) | ORAL | Status: DC
  Administered 2023-04-15 – 2023-04-16 (×2): 50 mg via ORAL
  Filled 2023-04-15 (×2): qty 1

## 2023-04-15 MED ORDER — FENTANYL CITRATE (PF) 100 MCG/2ML IJ SOLN
INTRAMUSCULAR | Status: DC | PRN
  Administered 2023-04-15 (×3): 12.5 ug via INTRAVENOUS

## 2023-04-15 MED ORDER — ATORVASTATIN CALCIUM 10 MG PO TABS
10.0000 mg | ORAL_TABLET | Freq: Every day | ORAL | Status: DC
  Administered 2023-04-15 – 2023-04-16 (×2): 10 mg via ORAL
  Filled 2023-04-15 (×2): qty 1

## 2023-04-15 MED ORDER — SODIUM CHLORIDE 0.9% FLUSH: 3.0000 mL | INTRAVENOUS | Status: DC | PRN

## 2023-04-15 MED ORDER — LABETALOL HCL 5 MG/ML IV SOLN: 10.0000 mg | INTRAVENOUS | Status: AC | PRN

## 2023-04-15 MED ORDER — NITROGLYCERIN 0.4 MG SL SUBL: 0.4000 mg | SUBLINGUAL_TABLET | SUBLINGUAL | Status: DC | PRN

## 2023-04-15 MED ORDER — SODIUM CHLORIDE 0.9 % WEIGHT BASED INFUSION
3.0000 mL/kg/h | INTRAVENOUS | Status: DC
  Administered 2023-04-15: 3 mL/kg/h via INTRAVENOUS

## 2023-04-15 MED ORDER — BUSPIRONE HCL 10 MG PO TABS
10.0000 mg | ORAL_TABLET | Freq: Two times a day (BID) | ORAL | Status: DC
  Administered 2023-04-15 – 2023-04-16 (×2): 10 mg via ORAL
  Filled 2023-04-15 (×2): qty 1

## 2023-04-15 MED ORDER — NITROGLYCERIN 1 MG/10 ML FOR IR/CATH LAB
INTRA_ARTERIAL | Status: AC
  Filled 2023-04-15: qty 10

## 2023-04-15 MED ORDER — MIDAZOLAM HCL 2 MG/2ML IJ SOLN
INTRAMUSCULAR | Status: DC | PRN
  Administered 2023-04-15 (×3): .5 mg via INTRAVENOUS

## 2023-04-15 MED ORDER — IOHEXOL 300 MG/ML  SOLN
INTRAMUSCULAR | Status: DC | PRN
Start: 2023-04-15 — End: 2023-04-15
  Administered 2023-04-15: 167 mL

## 2023-04-15 MED ORDER — HEPARIN SODIUM (PORCINE) 1000 UNIT/ML IJ SOLN
INTRAMUSCULAR | Status: DC | PRN
  Administered 2023-04-15 (×2): 4500 [IU] via INTRAVENOUS
  Administered 2023-04-15: 2000 [IU] via INTRAVENOUS

## 2023-04-15 MED ORDER — NITROGLYCERIN 1 MG/10 ML FOR IR/CATH LAB
INTRA_ARTERIAL | Status: DC | PRN
  Administered 2023-04-15: 100 ug via INTRA_ARTERIAL
  Administered 2023-04-15: 200 ug via INTRA_ARTERIAL
  Administered 2023-04-15: 100 ug via INTRA_ARTERIAL

## 2023-04-15 MED ORDER — CLOPIDOGREL BISULFATE 75 MG PO TABS
75.0000 mg | ORAL_TABLET | Freq: Every day | ORAL | Status: DC
  Administered 2023-04-16: 75 mg via ORAL
  Filled 2023-04-15: qty 1

## 2023-04-15 MED ORDER — UMECLIDINIUM-VILANTEROL 62.5-25 MCG/ACT IN AEPB
1.0000 | INHALATION_SPRAY | Freq: Every day | RESPIRATORY_TRACT | Status: DC
  Administered 2023-04-16: 1 via RESPIRATORY_TRACT
  Filled 2023-04-15: qty 14

## 2023-04-15 MED ORDER — APIXABAN 5 MG PO TABS
5.0000 mg | ORAL_TABLET | Freq: Two times a day (BID) | ORAL | Status: DC
  Administered 2023-04-16: 5 mg via ORAL
  Filled 2023-04-15: qty 1

## 2023-04-15 MED ORDER — MIDAZOLAM HCL 2 MG/2ML IJ SOLN
INTRAMUSCULAR | Status: AC
  Filled 2023-04-15: qty 2

## 2023-04-15 MED ORDER — SODIUM CHLORIDE 0.9 % IV SOLN: 250.0000 mL | INTRAVENOUS | Status: DC | PRN

## 2023-04-15 MED ORDER — ALUM & MAG HYDROXIDE-SIMETH 200-200-20 MG/5ML PO SUSP: 15.0000 mL | Freq: Once | ORAL | Status: DC

## 2023-04-15 MED ORDER — LIDOCAINE HCL (PF) 1 % IJ SOLN
INTRAMUSCULAR | Status: DC | PRN
  Administered 2023-04-15 (×2): 2 mL

## 2023-04-15 MED ORDER — FUROSEMIDE 40 MG PO TABS
80.0000 mg | ORAL_TABLET | Freq: Two times a day (BID) | ORAL | Status: DC
  Administered 2023-04-15 – 2023-04-16 (×2): 80 mg via ORAL
  Filled 2023-04-15 (×2): qty 2

## 2023-04-15 MED ORDER — HYDRALAZINE HCL 20 MG/ML IJ SOLN: 10.0000 mg | INTRAMUSCULAR | Status: AC | PRN

## 2023-04-15 MED ORDER — LOSARTAN POTASSIUM 25 MG PO TABS
12.5000 mg | ORAL_TABLET | Freq: Every day | ORAL | Status: DC
Start: 2023-04-16 — End: 2023-04-16
  Administered 2023-04-16: 12.5 mg via ORAL
  Filled 2023-04-15: qty 1

## 2023-04-15 MED ORDER — HEPARIN SODIUM (PORCINE) 1000 UNIT/ML IJ SOLN
INTRAMUSCULAR | Status: AC
  Filled 2023-04-15: qty 10

## 2023-04-15 MED ORDER — CLOPIDOGREL BISULFATE 75 MG PO TABS
ORAL_TABLET | ORAL | Status: AC
  Filled 2023-04-15: qty 8

## 2023-04-15 MED ORDER — RIFAXIMIN 550 MG PO TABS
550.0000 mg | ORAL_TABLET | Freq: Two times a day (BID) | ORAL | Status: DC
  Administered 2023-04-15 – 2023-04-16 (×2): 550 mg via ORAL
  Filled 2023-04-15 (×4): qty 1

## 2023-04-15 MED ORDER — VERAPAMIL HCL 2.5 MG/ML IV SOLN
INTRAVENOUS | Status: DC | PRN
  Administered 2023-04-15 (×2): 2.5 mg via INTRA_ARTERIAL

## 2023-04-15 SURGICAL SUPPLY — 22 items
BALLN TREK RX 2.5X12 (BALLOONS) ×2 IMPLANT
BALLN ~~LOC~~ TREK NEO RX 3.75X15 (BALLOONS) ×2 IMPLANT
BALLOON TREK RX 2.5X12 (BALLOONS) IMPLANT
BALLOON ~~LOC~~ TREK NEO RX 3.75X15 (BALLOONS) IMPLANT
CATH 5F 110X4 TIG (CATHETERS) IMPLANT
CATH BALLN WEDGE 5F 110CM (CATHETERS) IMPLANT
CATH DRAGONFLY OPSTAR (CATHETERS) IMPLANT
CATH INFINITI 5FR ANG PIGTAIL (CATHETERS) IMPLANT
CATH VISTA GUIDE 6FR XBLD 3.5 (CATHETERS) IMPLANT
DEVICE RAD TR BAND REGULAR (VASCULAR PRODUCTS) IMPLANT
DRAPE BRACHIAL (DRAPES) IMPLANT
GLIDESHEATH SLEND SS 6F .021 (SHEATH) IMPLANT
GUIDEWIRE INQWIRE 1.5J.035X260 (WIRE) IMPLANT
INQWIRE 1.5J .035X260CM (WIRE) ×2 IMPLANT
KIT ENCORE 26 ADVANTAGE (KITS) IMPLANT
PACK CARDIAC CATH (CUSTOM PROCEDURE TRAY) ×2 IMPLANT
PROTECTION STATION PRESSURIZED (MISCELLANEOUS) ×2 IMPLANT
SET ATX-X65L (MISCELLANEOUS) IMPLANT
SHEATH GLIDE SLENDER 4/5FR (SHEATH) IMPLANT
STATION PROTECTION PRESSURIZED (MISCELLANEOUS) IMPLANT
STENT ONYX FRONTIER 3.5X18 (Permanent Stent) IMPLANT
WIRE RUNTHROUGH .014X180CM (WIRE) IMPLANT

## 2023-04-15 NOTE — Plan of Care (Signed)

## 2023-04-15 NOTE — Interval H&P Note (Signed)
 History and Physical Interval Note:  04/15/2023 12:43 PM  Ariana Riggs  has presented today for surgery, with the diagnosis of unstable angina and heart failure with improved ejection fraction.  The various methods of treatment have been discussed with the patient and family. After consideration of risks, benefits and other options for treatment, the patient has consented to  Procedure(s): RIGHT/LEFT HEART CATH AND CORONARY ANGIOGRAPHY (Bilateral) as a surgical intervention.  The patient's history has been reviewed, patient examined, no change in status, stable for surgery.  I have reviewed the patient's chart and labs.  Questions were answered to the patient's satisfaction.    Cath Lab Visit (complete for each Cath Lab visit)  Clinical Evaluation Leading to the Procedure:   ACS: No.  Non-ACS:    Anginal Classification: CCS IV  Anti-ischemic medical therapy: Minimal Therapy (1 class of medications)  Non-Invasive Test Results: No non-invasive testing performed  Prior CABG: No previous CABG  Ned Kakar

## 2023-04-15 NOTE — Brief Op Note (Signed)
 BRIEF CARDIAC CATHETERIZATION NOTE  04/15/2023  2:50 PM  PATIENT:  Ariana Riggs  63 y.o. female  PRE-OPERATIVE DIAGNOSIS:  Unstable angina  POST-OPERATIVE DIAGNOSIS:  Same  PROCEDURE:  Procedure(s): RIGHT/LEFT HEART CATH AND CORONARY ANGIOGRAPHY (Bilateral) CORONARY IMAGING/OCT (N/A)  SURGEON:  Surgeons and Role:    * Evanne Matsunaga, MD - Primary  FINDINGS: Severe single-vessel CAD with 80% proximal/mid LCx lesion (MLA 2.94 mm^2). Mild elevated left heart filling pressures. Moderately elevated right heart and PA pressures. Normal Fick CO/CI. Successful OCT-guided PCI to proximal/mid LCx using Onyx Frontier 3.5 x 18 mm DES.  RECOMMENDATIONS: Overnight observation. Resume apixaban tomorrow morning if no evidence of bleeding.  Anticipate ASA, clopidogrel, and apixaban x 1 week, followed by apixaban and clopidogrel for at least 6 months. Continue diuresis and aggressive secondary prevention of CAD.  Yvonne Kendall, MD [facility]

## 2023-04-16 ENCOUNTER — Other Ambulatory Visit

## 2023-04-16 DIAGNOSIS — I25110 Atherosclerotic heart disease of native coronary artery with unstable angina pectoris: Secondary | ICD-10-CM | POA: Diagnosis not present

## 2023-04-16 LAB — CBC
HCT: 37.8 % (ref 36.0–46.0)
Hemoglobin: 13.4 g/dL (ref 12.0–15.0)
MCH: 33.4 pg (ref 26.0–34.0)
MCHC: 35.4 g/dL (ref 30.0–36.0)
MCV: 94.3 fL (ref 80.0–100.0)
Platelets: 141 10*3/uL — ABNORMAL LOW (ref 150–400)
RBC: 4.01 MIL/uL (ref 3.87–5.11)
RDW: 15 % (ref 11.5–15.5)
WBC: 4.7 10*3/uL (ref 4.0–10.5)
nRBC: 0 % (ref 0.0–0.2)

## 2023-04-16 LAB — BASIC METABOLIC PANEL
Anion gap: 13 (ref 5–15)
BUN: 19 mg/dL (ref 8–23)
CO2: 27 mmol/L (ref 22–32)
Calcium: 9.6 mg/dL (ref 8.9–10.3)
Chloride: 101 mmol/L (ref 98–111)
Creatinine, Ser: 0.7 mg/dL (ref 0.44–1.00)
GFR, Estimated: >60 mL/min (ref >60)
Glucose, Bld: 132 mg/dL — ABNORMAL HIGH (ref 70–99)
Potassium: 3.4 mmol/L — ABNORMAL LOW (ref 3.5–5.1)
Sodium: 141 mmol/L (ref 135–145)

## 2023-04-16 MED ORDER — CLOPIDOGREL BISULFATE 75 MG PO TABS
75.0000 mg | ORAL_TABLET | Freq: Every day | ORAL | 6 refills | Status: DC
Start: 2023-04-16 — End: 2023-06-07

## 2023-04-16 MED ORDER — ASPIRIN 81 MG PO TBEC: 81.0000 mg | DELAYED_RELEASE_TABLET | Freq: Every day | ORAL | 0 refills | Status: AC

## 2023-04-16 MED ORDER — FUROSEMIDE 80 MG PO TABS: 80.0000 mg | ORAL_TABLET | Freq: Two times a day (BID) | ORAL | 3 refills | Status: DC

## 2023-04-16 MED ORDER — NITROGLYCERIN 0.4 MG SL SUBL
0.4000 mg | SUBLINGUAL_TABLET | SUBLINGUAL | 12 refills | Status: DC | PRN
Start: 2023-04-16 — End: 2023-12-27

## 2023-04-16 NOTE — Discharge Summary (Signed)
 Discharge Summary    Patient ID: Ariana Riggs MRN: [id number]; DOB: 12-11-1960  Admit date: 04/15/2023 Discharge date: 04/16/2023  Primary Care Provider: Luciana Axe, NP  Primary Cardiologist: Yvonne Kendall, MD  Primary Electrophysiologist:  Lanier Prude, MD   Discharge Diagnoses    Principal Problem:   Unstable angina [facility])   Diagnostic Studies/Procedures    Conclusions: Severe single-vessel coronary artery disease with 80% proximal/mid LCx lesion (MLA 2.94 mm^2).  No significant CAD observed in the LAD or RCA. Mild elevated left heart filling pressures with prominent v-waves noted on PCWP tracing suggestive of significant mitral regurgitation. Moderately elevated right heart and pulmonary artery pressures. Normal Fick CO/CI. Successful OCT-guided PCI to proximal/mid LCx using Onyx Frontier 3.5 x 18 mm DES.   Recommendations: Overnight observation. Resume apixaban tomorrow morning if no evidence of bleeding.  Anticipate aspirin, clopidogrel, and apixaban x 1 week, followed by apixaban and clopidogrel for at least 6 months. Continue diuresis and aggressive secondary prevention of coronary artery disease. Obtain echocardiogram to reevaluate LVEF and better assess mitral regurgitation.   Yvonne Kendall, MD [facility]  Diagnostic Dominance: Right  Intervention   _____________   History of Present Illness     Ariana Riggs is a 63 y.o. 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  Hospital Course     Patient presented to [facility] on 04/15/23 for an elective left heart catheterization after having symptoms concerning for unstable angina.   She had noted to 59-month history of increased shortness of breath and exertional fatigue.  But over 2 weeks prior his symptoms have become significantly more pronounced.  There was her intermittent episodes of substernal chest pressure that radiated to the left side of her neck, left shoulder, and down the left arm.  These episodes lasted for approximately 5 to 10 minutes.  She also noted bilateral lower extremity discomfort and reports that was her main symptom leading up to her PCI in 2020.  So she underwent left heart catheterization which revealed severe single-vessel coronary disease with 80% proximal/mid left circumflex lesion.  No significant CAD observed in the LAD or RCA.  Mild elevated left heart filling pressures with prominent V waves noted on PCWP tracing suggestive of significant mitral regurgitation.  Moderately elevated right heart and pulmonary artery pressures.  Normal Fick CO/CI.  She had successful OCT-guided PCI to proximal/mid left circumflex using DES.  She was kept for overnight observation.  Vital signs have remained stable.  Apixaban was resumed at this morning as there has been no evidence of bleeding.  She has been advised that she will take aspirin 81 mg daily x 1 week in conjunction with clopidogrel 75 mg daily and apixaban 5 mg twice daily.  This will be followed by apixaban 5 mg twice daily clopidogrel 75 mg daily for at least 6 months.  She is to continue with her diuresis and aggressive secondary prevention of coronary artery disease.  She also has an echocardiogram that has been scheduled to reevaluate her LVEF and better assess her mitral regurgitation.  Vital signs have remained stable and overnight was uneventful.  This morning she is up in the room accompanied by her husband preparing to leave the hospital.  She has had no further chest  pain, shortness of breath and no pain or bleeding from her right radial cath site.  Ambulating around the room without any concerning signs  of angina.  Eager to go home this morning.  Did the patient have an acute coronary syndrome (MI, NSTEMI, STEMI, etc) this admission?:  No                               Did the patient have a percutaneous coronary intervention (stent / angioplasty)?:  Yes.     Cath/PCI Registry Performance & Quality Measures: Aspirin prescribed? - Yes ADP Receptor Inhibitor (Plavix/Clopidogrel, Brilinta/Ticagrelor or Effient/Prasugrel) prescribed (includes medically managed patients)? - Yes High Intensity Statin (Lipitor 40-80mg  or Crestor 20-40mg ) prescribed? - Yes For EF <40%, was ACEI/ARB prescribed? - Not Applicable (EF >/= 40%) For EF <40%, Aldosterone Antagonist (Spironolactone or Eplerenone) prescribed? - Not Applicable (EF >/= 40%) Cardiac Rehab Phase II ordered? - Yes  _____________  Physical Exam   Discharge Vitals Blood pressure 135/61, pulse 67, temperature 98.3 F (36.8 C), temperature source Oral, resp. rate 14, height 5\' 6"  (1.676 m), weight 91.2 kg, SpO2 94%.  Filed Weights   04/15/23 1145  Weight: 91.2 kg    GEN: Well nourished, well developed, in no acute distress.  HEENT: Grossly normal.  Neck: Supple, no JVD, carotid bruits, or masses. Cardiac: RRR, II/VI systolic murmur LSB, without rubs or gallops. No clubbing, cyanosis, edema.  Radials/DP/PT 2+ and equal bilaterally.  Respiratory:  Respirations regular and unlabored, clear to auscultation bilaterally. GI: Soft, nontender, nondistended, BS + x 4. MS: no deformity or atrophy.  Right radial cath site with gauze and OpSite dressing that is clean, dry, and intact.  2+ right radial pulse.  No bleeding, bruising, or hematoma noted at site Skin: warm and dry, no rash. Neuro:  Strength and sensation are intact. Psych: AAOx3.  Normal affect.  Labs & Radiologic Studies    CBC Recent Labs    04/15/23 1327 04/16/23 0446  WBC  --  4.7  HGB 11.9* 13.4  HCT 35.0* 37.8  MCV  --  94.3  PLT  --  141*   Basic Metabolic Panel Recent  Labs    04/15/23 1327 04/16/23 0446  NA 141 141  K 3.8 3.4*  CL  --  101  CO2  --  27  GLUCOSE  --  132*  BUN  --  19  CREATININE  --  0.70  CALCIUM  --  9.6   Liver Function Tests No results for input(s): "AST", "ALT", "ALKPHOS", "BILITOT", "PROT", "ALBUMIN" in the last 72 hours. No results for input(s): "LIPASE", "AMYLASE" in the last 72 hours. High Sensitivity Troponin:   No results for input(s): "TROPONINIHS" in the last 720 hours.  BNP Invalid input(s): "POCBNP" D-Dimer No results for input(s): "DDIMER" in the last 72 hours. Hemoglobin A1C No results for input(s): "HGBA1C" in the last 72 hours. Fasting Lipid Panel No results for input(s): "CHOL", "HDL", "LDLCALC", "TRIG", "CHOLHDL", "LDLDIRECT" in the last 72 hours. Thyroid Function Tests No results for input(s): "TSH", "T4TOTAL", "T3FREE", "THYROIDAB" in the last 72 hours.  Invalid input(s): "FREET3" _____________  CARDIAC CATHETERIZATION Result Date: 04/15/2023 Conclusions: Severe single-vessel coronary artery disease with 80% proximal/mid LCx lesion (MLA 2.94 mm^2).  No significant CAD observed in the LAD or RCA. Mild elevated left heart filling pressures with prominent v-waves noted on PCWP tracing suggestive of significant mitral regurgitation. Moderately elevated right heart and pulmonary  artery pressures. Normal Fick CO/CI. Successful OCT-guided PCI to proximal/mid LCx using Onyx Frontier 3.5 x 18 mm DES.  Recommendations: Overnight observation. Resume apixaban tomorrow morning if no evidence of bleeding.  Anticipate aspirin, clopidogrel, and apixaban x 1 week, followed by apixaban and clopidogrel for at least 6 months. Continue diuresis and aggressive secondary prevention of coronary artery disease. Obtain echocardiogram to reevaluate LVEF and better assess mitral regurgitation. Yvonne Kendall, MD [facility]  MM 3D DIAGNOSTIC MAMMOGRAM UNILATERAL LEFT BREAST Result Date: 04/13/2023 CLINICAL DATA:  63 year old  female presenting with a palpable area of concern in the LEFT breast, felt for the past 1 month. She is status post LEFT lumpectomy for invasive carcinoma in July 2023, with negative margins and received adjuvant radiation therapy. EXAM: DIGITAL DIAGNOSTIC UNILATERAL LEFT MAMMOGRAM WITH TOMOSYNTHESIS AND CAD; ULTRASOUND LEFT BREAST LIMITED TECHNIQUE: Left digital diagnostic mammography and breast tomosynthesis was performed. The images were evaluated with computer-aided detection. ; Targeted ultrasound examination of the left breast was performed. COMPARISON:  Previous exam(s). ACR Breast Density Category c: The breasts are heterogeneously dense, which may obscure small masses. FINDINGS: MAMMOGRAM: Diagnostic mammographic images were obtained over the area of palpable concern in the left breast. No suspicious mammographic finding is identified in this area. Stable postlumpectomy changes in the left breast. No suspicious mass, microcalcification, or other finding is identified in the left breast. ULTRASOUND: On physical exam, there is a focal firmness in the area of palpable concern in the upper-outer left breast. This is adjacent to but not directly associated with visible scar from recent lumpectomy. Targeted left breast ultrasound was performed by the physician the area of palpable concern. At 2 o'clock 5 cm from the nipple, there is a non mass-like hypoechoic area with associated posterior shadowing that measures approximately 2.2 x 0.8 x 0.9 cm. There is no internal vascularity. Targeted left axillary ultrasound demonstrates normal soft tissue. No lymphadenopathy. IMPRESSION: There is a non mass-like hypoechoic area in the left breast 2 o'clock position, which corresponds with the patient's area of palpable concern. While this may represent scar tissue from recent lumpectomy, recommend further assessment with ultrasound-guided biopsy for definitive characterization. RECOMMENDATION: Left breast ultrasound-guided  biopsy (1 site). I have discussed the findings and recommendations with the patient. The biopsy procedure was discussed with the patient and questions were answered. Patient expressed their understanding of the biopsy recommendation. Patient will be scheduled for biopsy at her earliest convenience by the schedulers. Ordering provider will be notified. If applicable, a reminder letter will be sent to the patient regarding the next appointment. BI-RADS CATEGORY  4: Suspicious. Electronically Signed   By: Jacob Moores M.D.   On: 04/13/2023 12:43   [place] LIMITED ULTRASOUND INCLUDING AXILLA LEFT BREAST  Result Date: 04/13/2023 CLINICAL DATA:  63 year old female presenting with a palpable area of concern in the LEFT breast, felt for the past 1 month. She is status post LEFT lumpectomy for invasive carcinoma in July 2023, with negative margins and received adjuvant radiation therapy. EXAM: DIGITAL DIAGNOSTIC UNILATERAL LEFT MAMMOGRAM WITH TOMOSYNTHESIS AND CAD; ULTRASOUND LEFT BREAST LIMITED TECHNIQUE: Left digital diagnostic mammography and breast tomosynthesis was performed. The images were evaluated with computer-aided detection. ; Targeted ultrasound examination of the left breast was performed. COMPARISON:  Previous exam(s). ACR Breast Density Category c: The breasts are heterogeneously dense, which may obscure small masses. FINDINGS: MAMMOGRAM: Diagnostic mammographic images were obtained over the area of palpable concern in the left breast. No suspicious mammographic finding is identified in this area.  Stable postlumpectomy changes in the left breast. No suspicious mass, microcalcification, or other finding is identified in the left breast. ULTRASOUND: On physical exam, there is a focal firmness in the area of palpable concern in the upper-outer left breast. This is adjacent to but not directly associated with visible scar from recent lumpectomy. Targeted left breast ultrasound was performed by the physician the  area of palpable concern. At 2 o'clock 5 cm from the nipple, there is a non mass-like hypoechoic area with associated posterior shadowing that measures approximately 2.2 x 0.8 x 0.9 cm. There is no internal vascularity. Targeted left axillary ultrasound demonstrates normal soft tissue. No lymphadenopathy. IMPRESSION: There is a non mass-like hypoechoic area in the left breast 2 o'clock position, which corresponds with the patient's area of palpable concern. While this may represent scar tissue from recent lumpectomy, recommend further assessment with ultrasound-guided biopsy for definitive characterization. RECOMMENDATION: Left breast ultrasound-guided biopsy (1 site). I have discussed the findings and recommendations with the patient. The biopsy procedure was discussed with the patient and questions were answered. Patient expressed their understanding of the biopsy recommendation. Patient will be scheduled for biopsy at her earliest convenience by the schedulers. Ordering provider will be notified. If applicable, a reminder letter will be sent to the patient regarding the next appointment. BI-RADS CATEGORY  4: Suspicious. Electronically Signed   By: Jacob Moores M.D.   On: 04/13/2023 12:43   US Abdomen Complete Result Date: 03/29/2023 : PROCEDURE: ULTRASOUND ABDOMEN COMPLETE HISTORY: Patient is a 63 y/o Female with cirrhosis of liver without ascites. Cholecystectomy. COMPARISON: U/S abdomen 10/20/2022, 11/16/2021 TECHNIQUE: Two-dimensional grayscale and color Doppler ultrasound of the abdomen was performed. FINDINGS: The pancreas demonstrates a normal homogenous echotexture with the tail suboptimally visualized due to overlying bowel gas. The liver demonstrates an increased and slightly coarsened echotexture without intrahepatic biliary dilatation. No masses are visualized. The main portal vein demonstrates normal hepatopedal flow. The gallbladder is surgically absent. The common bile duct measures 0.8 cm.  The right kidney measures 11.1 cm in length. Renal cortical echotexture is within normal limits. There is no hydronephrosis. There are no stones. There is a simple cyst measuring 1.0 cm at the superior pole. The left kidney measures 10.8 cm in length. Renal cortical echotexture is within normal limits. There is no hydronephrosis. There are no stones. There are multiple simple cysts with the largest measuring 1.1 cm. The spleen is enlarged and measures 15.2 cm in length. It demonstrates a normal echotexture. There is no evidence of aneurysm within the visualized segments of the abdominal aorta. The visualized segments of the IVC are unremarkable. IMPRESSION: 1. Increased and slightly coarsened hepatic echotexture, most commonly seen with cirrhosis. No mass. 2.  Surgically absent gallbladder.  No biliary dilatation. 3.  Bilateral renal cysts. 4.  Splenomegaly. Thank you for allowing [place] to assist in the care of this patient. Electronically Signed   By: Lestine Box M.D.   On: 03/29/2023 15:37   Disposition   Pt is being discharged home today in good condition.  Follow-up Plans & Appointments   Mar  28 ECHOCARDIOGRAM Friday May 13, 2023 11:30 AM [facility] [place] [facility] [place] [facility] [address], [address] [place] [address] [phone]  Apr  3 Office Visit with Eula Listen Thursday May 19, 2023 2:45 PM Please arrive 15 minutes prior to your appointment. This will allow [place] to verify and update your medical record and ensure a  full appointment for you within the time allotted. [facility] [address], [address] [place] [address] [phone]    Discharge Instructions     AMB Referral to Cardiac Rehabilitation - Phase II   Complete by: As directed    Diagnosis: Coronary Stents   After initial evaluation and assessments completed: Virtual Based Care may be provided alone or in conjunction with Phase 2  Cardiac Rehab based on patient barriers.: Yes   Intensive Cardiac Rehabilitation (ICR) [facility] location only OR Traditional Cardiac Rehabilitation (TCR) *If criteria for ICR are not met will enroll in TCR [facility] only): Yes   Call MD for:  difficulty breathing, headache or visual disturbances   Complete by: As directed    Call MD for:  persistant nausea and vomiting   Complete by: As directed    Call MD for:  redness, tenderness, or signs of infection (pain, swelling, redness, odor or green/yellow discharge around incision site)   Complete by: As directed    Call MD for:  severe uncontrolled pain   Complete by: As directed    Call MD for:  temperature >100.4   Complete by: As directed    Diet - low sodium heart healthy   Complete by: As directed    Increase activity slowly   Complete by: As directed        Discharge Medications   Allergies as of 04/16/2023       Reactions   Melatonin Hives   Doxycycline Nausea And Vomiting   Amiodarone    hyperthyroidism   Erythromycin Nausea And Vomiting   Paxil [paroxetine Hcl] Hives   Penicillins Hives   Sulfa Antibiotics Hives        Medication List     TAKE these medications    albuterol 108 (90 Base) MCG/ACT inhaler Commonly known as: VENTOLIN HFA Inhale 2 puffs into the lungs every 6 (six) hours as needed for wheezing or shortness of breath.   anastrozole 1 MG tablet Commonly known as: ARIMIDEX TAKE 1 TABLET BY MOUTH EVERY DAY   Anoro Ellipta 62.5-25 MCG/ACT Aepb Generic drug: umeclidinium-vilanterol Inhale 1 puff into the lungs daily.   aspirin EC 81 MG tablet Take 1 tablet (81 mg total) by mouth daily for 7 days. Swallow whole.   atorvastatin 10 MG tablet Commonly known as: LIPITOR TAKE 1 TABLET BY MOUTH EVERY DAY   busPIRone 10 MG tablet Commonly known as: BUSPAR Take 10 mg by mouth in the morning and at bedtime.   calcium carbonate 1500 (600 Ca) MG Tabs tablet Commonly known as: OSCAL Take 600 mg by mouth in the  morning.   cholecalciferol 25 MCG (1000 UNIT) tablet Commonly known as: VITAMIN D3 Take 1,000 Units by mouth in the morning.   citalopram 40 MG tablet Commonly known as: CELEXA Take 40 mg by mouth daily.   Clear Eyes for Dry Eyes 1-0.25 % Soln Generic drug: Carboxymethylcellul-Glycerin Place 2 drops into both eyes in the morning.   clopidogrel 75 MG tablet Commonly known as: PLAVIX Take 1 tablet (75 mg total) by mouth daily with breakfast.   Eliquis 5 MG Tabs tablet Generic drug: apixaban TAKE 1 TABLET BY MOUTH TWICE A DAY   furosemide 80 MG tablet Commonly known as: LASIX Take 1 tablet (80 mg total) by mouth 2 (two) times daily. What changed:  medication strength when to take this   lactulose 10 GM/15ML solution Commonly known as: CHRONULAC Take 20 g by mouth daily  as needed (hepatic).   lansoprazole 30 MG capsule Commonly known as: PREVACID Take 30 mg by mouth daily before breakfast.   losartan 25 MG tablet Commonly known as: COZAAR TAKE 1/2 TABLET BY MOUTH DAILY   metoprolol succinate 50 MG 24 hr tablet Commonly known as: TOPROL-XL TAKE 1 TABLET BY MOUTH IN THE MORNING AND AT BEDTIME. TAKE WITH OR IMMEDIATELY FOLLOWING A MEAL.   multivitamin with minerals Tabs tablet Take 1 tablet by mouth in the morning. Centrum Silver   nitroGLYCERIN 0.4 MG SL tablet Commonly known as: NITROSTAT Place 1 tablet (0.4 mg total) under the tongue every 5 (five) minutes as needed for chest pain.   OneTouch Delica Plus Lancet33G Misc ONCE DAILY CHECK SUGARS   potassium chloride SA 20 MEQ tablet Commonly known as: KLOR-CON M Take 2 tablets (40 mEq total) by mouth daily.   spironolactone 50 MG tablet Commonly known as: ALDACTONE TAKE 1 TABLET BY MOUTH EVERY DAY   traZODone 50 MG tablet Commonly known as: DESYREL Take 50 mg by mouth at bedtime.   Xifaxan 550 MG Tabs tablet Generic drug: rifaximin Take 550 mg by mouth 2 (two) times daily.   zaleplon 10 MG  capsule Commonly known as: SONATA Take 10 mg by mouth at bedtime.           Outstanding Labs/Studies     Duration of Discharge Encounter   MD Time:   NP/PA Time:  20 minutes  Signed, Darionna Banke, NP 04/16/2023, 9:21 AM

## 2023-04-16 NOTE — Plan of Care (Signed)
 This RN provided discharge instructions and teaching. Patient verbalized and demonstrated understanding of provided instructions. All outstanding questions resolved. X2 RA PIVs removed. Both cannulas intact. Pt tolerated well. All belongings packed and in tow.   Problem: Education: Goal: Understanding of CV disease, CV risk reduction, and recovery process will improve Outcome: Adequate for Discharge Goal: Individualized Educational Video(s) Outcome: Adequate for Discharge   Problem: Activity: Goal: Ability to return to baseline activity level will improve Outcome: Adequate for Discharge   Problem: Activity: Goal: Ability to return to baseline activity level will improve Outcome: Adequate for Discharge   Problem: Cardiovascular: Goal: Ability to achieve and maintain adequate cardiovascular perfusion will improve Outcome: Adequate for Discharge Goal: Vascular access site(s) Level 0-1 will be maintained Outcome: Adequate for Discharge   Problem: Health Behavior/Discharge Planning: Goal: Ability to safely manage health-related needs after discharge will improve Outcome: Adequate for Discharge

## 2023-04-16 NOTE — Discharge Instructions (Signed)
 Marland Kitchen

## 2023-04-16 NOTE — TOC Transition Note (Signed)
 Transition of Care [facility]) - Discharge Note   Patient Details  Name: Ariana Riggs MRN: [id number] Date of Birth: 1960-10-06  [facility]) CM/SW Contact:  Bing Quarry, RN Phone Number: 04/16/2023, 9:49 AM   Clinical Narrative:  3/1: Discharge orders in 0921 this am. OBS status but not any Medicare. No TOC consult, therapy recs, or SDOH alerts noted. Discharge to home/self care noted in orders.     Gabriel Cirri MSN RN CM  RN Case Manager [place]  Transitions of Care Direct Dial: [phone] (Weekends Only) [facility] Main Office Phone: [phone] [facility] Fax: [phone] [email or web address]           Patient Goals and CMS Choice            Discharge Placement                       Discharge Plan and Services Additional resources added to the After Visit Summary for                                       Social Drivers of Health (SDOH) Interventions SDOH Screenings   Food Insecurity: No Food Insecurity (04/15/2023)  Housing: Low Risk  (04/15/2023)  Transportation Needs: No Transportation Needs (04/15/2023)  Utilities: Not At Risk (04/15/2023)  Tobacco Use: High Risk (04/12/2023)     Readmission Risk Interventions     No data to display

## 2023-04-18 ENCOUNTER — Other Ambulatory Visit: Payer: Self-pay | Admitting: Emergency Medicine

## 2023-04-18 ENCOUNTER — Encounter: Payer: Self-pay | Admitting: Internal Medicine

## 2023-04-18 DIAGNOSIS — Z79899 Other long term (current) drug therapy: Secondary | ICD-10-CM

## 2023-04-19 LAB — LIPOPROTEIN A (LPA): Lipoprotein (a): 34.9 nmol/L — ABNORMAL HIGH (ref <75.0)

## 2023-04-23 ENCOUNTER — Other Ambulatory Visit: Payer: Self-pay | Admitting: Cardiology

## 2023-04-24 ENCOUNTER — Other Ambulatory Visit: Payer: Self-pay | Admitting: Internal Medicine

## 2023-04-27 ENCOUNTER — Ambulatory Visit
Admission: RE | Admit: 2023-04-27 | Discharge: 2023-04-27 | Disposition: A | Discharge status: Home / Self Care | Source: Ambulatory Visit | Attending: Oncology | Admitting: Oncology

## 2023-04-27 DIAGNOSIS — N6032 Fibrosclerosis of left breast: Secondary | ICD-10-CM | POA: Insufficient documentation

## 2023-04-27 DIAGNOSIS — R928 Other abnormal and inconclusive findings on diagnostic imaging of breast: Secondary | ICD-10-CM

## 2023-04-27 MED ORDER — LIDOCAINE-EPINEPHRINE 1 %-1:100000 IJ SOLN
10.0000 mL | Freq: Once | INTRAMUSCULAR | Status: AC
  Administered 2023-04-27: 10 mL
  Filled 2023-04-27: qty 10

## 2023-04-27 MED ORDER — LIDOCAINE 1 % OPTIME INJ - NO CHARGE
5.0000 mL | Freq: Once | INTRAMUSCULAR | Status: AC
  Administered 2023-04-27: 5 mL
  Filled 2023-04-27: qty 6

## 2023-04-28 LAB — SURGICAL PATHOLOGY

## 2023-05-12 ENCOUNTER — Ambulatory Visit: Payer: 59 | Admitting: Nurse Practitioner

## 2023-05-13 ENCOUNTER — Ambulatory Visit: Payer: 59 | Attending: Physician Assistant

## 2023-05-13 DIAGNOSIS — R0609 Other forms of dyspnea: Secondary | ICD-10-CM | POA: Diagnosis not present

## 2023-05-13 DIAGNOSIS — I5030 Unspecified diastolic (congestive) heart failure: Secondary | ICD-10-CM | POA: Diagnosis not present

## 2023-05-13 LAB — ECHOCARDIOGRAM COMPLETE
AR max vel: 2.32 cm2
AV Area VTI: 2.13 cm2
AV Area mean vel: 2.23 cm2
AV Mean grad: 4 mmHg
AV Peak grad: 7.6 mmHg
Ao pk vel: 1.38 m/s
Area-P 1/2: 3.85 cm2
MV M vel: 5.01 m/s
MV Peak grad: 100.2 mmHg
Radius: 0.6 cm
S' Lateral: 3.4 cm

## 2023-05-16 NOTE — Progress Notes (Unsigned)
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 55-60%. The cavity size was mildly dilated. Left ventricular  diastolic Doppler parameters are consistent with pseudonormalization.  Elevated mean left atrial pressure.   2. The  right ventricle has normal systolic function. The cavity was  moderately enlarged. There is no increase in right ventricular wall  thickness. Right ventricular systolic pressure is mildly elevated with an  estimated pressure of 36.5 mmHg.   3. Left atrial size was moderately dilated.   4. Right atrial size was mildly dilated.   5. The mitral valve was not well visualized. Mild thickening of the  mitral valve leaflet. Mitral valve regurgitation is moderate by color flow  Doppler. No evidence of mitral valve stenosis.   6. The tricuspid valve is not well visualized. Tricuspid valve  regurgitation is moderate.   7. The aortic valve was not well visualized.   8. The aortic root and ascending aorta are normal in size.    9. The interatrial septum was not well visualized.  __________   Eugenie Birks MPI 04/28/2018: Pharmacological myocardial perfusion imaging study with no significant  Ischemia Small fixed defect of mild intensity in the inferoapical region consistent with attenuation artifact Normal wall motion, EF estimated at 55% No EKG changes concerning for ischemia at peak stress or in recovery. Mild aortic arch calcification noted on attenuation corrected CT scan images Low risk scan __________   2D echo 03/28/2018: 1. The left ventricle has moderate-severely reduced systolic function,  with an ejection fraction of 30-35%. The cavity size was normal. Left  ventricular diastolic Doppler parameters are indeterminate.   2. The right ventricle has moderately reduced systolic function. The  cavity was moderately enlarged. There is no increase in right ventricular  wall thickness.   3. Left atrial size was severely dilated.   4. Right atrial size was moderately dilated.   5. Mitral valve regurgitation is moderate to severe by color flow  Doppler.   6. Tricuspid valve regurgitation is severe.   7. The inferior vena cava was dilated in size with <50% respiratory  variability.   8. Rhythm is normal sinus    EKG:  EKG is not ordered today.    Recent Labs: 05/24/2022: B Natriuretic Peptide 289.0 05/27/2022: Magnesium 2.4 03/08/2023: ALT 20 04/16/2023: BUN 19; Creatinine, Ser 0.70; Hemoglobin 13.4; Platelets 141; Potassium 3.4; Sodium 141  Recent Lipid Panel    Component Value Date/Time   CHOL 123 08/04/2022 1504   TRIG 167 (H) 08/04/2022 1504   HDL 43 08/04/2022 1504   CHOLHDL 2.9 08/04/2022 1504   VLDL 33 08/04/2022 1504   LDLCALC 47 08/04/2022 1504    PHYSICAL EXAM:    VS:  BP 132/70 (BP Location: Right Arm, Patient Position: Sitting, Cuff Size: Large)   Pulse 76   Resp 17   Ht 5\' 6"  (1.676 m)   Wt 206 lb (93.4 kg)   LMP  (LMP Unknown)   SpO2 93%   BMI 33.25 kg/m   BMI: Body mass index is  33.25 kg/m.  Physical Exam Vitals reviewed.  Constitutional:      Appearance: She is well-developed.  HENT:     Head: Normocephalic and atraumatic.  Eyes:     General:        Right eye: No discharge.        Left eye: No discharge.  Cardiovascular:     Rate and Rhythm: Normal rate and regular rhythm.     Pulses:          Posterior tibial pulses are 1+ on the right side and 1+ on the left side.     Heart sounds: S1 normal and S2 normal. Heart sounds not distant. No midsystolic  click and no opening snap. Murmur heard.     Systolic murmur is present with a grade of 2/6 at the lower left sternal border.     No friction rub.  Pulmonary:     Effort: Pulmonary effort is normal. No respiratory distress.     Breath sounds: Wheezing present. No decreased breath sounds, rhonchi or rales.  Chest:     Chest wall: No tenderness.  Abdominal:     General: There is distension.     Palpations: Abdomen is soft.  Musculoskeletal:     Cervical back: Normal range of motion.     Right lower leg: No edema.     Left lower leg: No edema.  Skin:    General: Skin is warm and dry.     Nails: There is no clubbing.  Neurological:     Mental Status: She is alert and oriented to person, place, and time.  Psychiatric:        Speech: Speech normal.        Behavior: Behavior normal.        Thought Content: Thought content normal.        Judgment: Judgment normal.     Wt Readings from Last 3 Encounters:  05/19/23 206 lb (93.4 kg)  04/15/23 201 lb (91.2 kg)  04/12/23 205 lb (93 kg)     ASSESSMENT & PLAN:   CAD involving the native coronary arteries without angina: She is without symptoms of angina.  Status post PCI/DES to the proximal/mid LCx on 04/15/2023.  Continue clopidogrel and apixaban without interruption for a minimum of 6 months, after which time could consider discontinuation of clopidogrel with ongoing management with apixaban +/- aspirin.  Aggressive risk factor modification and secondary  prevention including atorvastatin and metoprolol.  No indication for further ischemic testing at this time.  HFimpEF/pulmonary hypertension with chronic dyspnea: RHC showed mildly elevated left heart filling pressure with moderately elevated right heart and pulmonary artery pressures.  Echo showed estimated RVSP of 62.9 mmHg.  She remains on furosemide 80 mg twice daily, losartan 12.5 mg, and spironolactone to 50 mg daily.  Anticipate addition of SGLT2 inhibitor pending updated labs.  Referred to advanced heart failure service assistance in managing pulmonary hypertension and valvular heart disease in the context of underlying cirrhosis with ascites.  She will also be referred to pulmonology for ongoing management/evaluation of his COPD with chronic dyspnea.  Persistent A-fib: Remains on Toprol-XL 50 mg twice daily.  CHA2DS2-VASc at least 3 (CHF, vascular disease, sex category).  Continue apixaban 5 mg twice daily.  No falls or symptoms concerning for bleeding.  Does not meet reduced dosing criteria.  Update CBC and BMP.  Mitral regurgitation: Stable, moderate to severe by echo in 04/2023 dating back to echo in 2020.  She has been referred to advanced heart failure service as outlined above.  Lower extremity claudication: Schedule ABI and lower extremity arterial ultrasound.  Remains on atorvastatin.  HLD: LDL 40 in 11/2022.  She remains on atorvastatin 10 mg.  Hepatic cirrhosis: Child class A.  Ongoing management per PCP and hepatology.  Likely contributing to her overall presentation.    Disposition: F/u with Dr. Okey Dupre or an APP in 3 months, and EP as directed.    Medication Adjustments/Labs and Tests Ordered: Current medicines are reviewed at length with the patient today.  Concerns regarding medicines are outlined above. Medication changes, Labs and Tests ordered today are summarized above and listed in the Patient Instructions accessible in Encounters.  Signed, Eula Listen, PA-C 05/19/2023  5:09 PM     [facility] [place] [address] [place], [place] [address] [phone]

## 2023-05-19 ENCOUNTER — Ambulatory Visit: Payer: 59 | Attending: Physician Assistant | Admitting: Physician Assistant

## 2023-05-19 ENCOUNTER — Encounter: Payer: Self-pay | Admitting: Physician Assistant

## 2023-05-19 VITALS — BP 132/70 | HR 76 | Resp 17 | Ht 66.0 in | Wt 206.0 lb

## 2023-05-19 DIAGNOSIS — I5032 Chronic diastolic (congestive) heart failure: Secondary | ICD-10-CM

## 2023-05-19 DIAGNOSIS — K7460 Unspecified cirrhosis of liver: Secondary | ICD-10-CM

## 2023-05-19 DIAGNOSIS — R188 Other ascites: Secondary | ICD-10-CM

## 2023-05-19 DIAGNOSIS — I272 Pulmonary hypertension, unspecified: Secondary | ICD-10-CM

## 2023-05-19 DIAGNOSIS — I25110 Atherosclerotic heart disease of native coronary artery with unstable angina pectoris: Secondary | ICD-10-CM | POA: Diagnosis not present

## 2023-05-19 DIAGNOSIS — I739 Peripheral vascular disease, unspecified: Secondary | ICD-10-CM

## 2023-05-19 DIAGNOSIS — Z79899 Other long term (current) drug therapy: Secondary | ICD-10-CM

## 2023-05-19 DIAGNOSIS — R0609 Other forms of dyspnea: Secondary | ICD-10-CM

## 2023-05-19 DIAGNOSIS — I4819 Other persistent atrial fibrillation: Secondary | ICD-10-CM

## 2023-05-19 DIAGNOSIS — I340 Nonrheumatic mitral (valve) insufficiency: Secondary | ICD-10-CM

## 2023-05-19 DIAGNOSIS — E785 Hyperlipidemia, unspecified: Secondary | ICD-10-CM

## 2023-05-19 NOTE — Patient Instructions (Addendum)
 Medication Instructions:  Your Physician recommend you continue on your current medication as directed.     *If you need a refill on your cardiac medications before your next appointment, please call your pharmacy*  Lab Work: Your provider would like for you to have following labs drawn today BMeT, and CBC.   If you have labs (blood work) drawn today and your tests are completely normal, you will receive your results only by: MyChart Message (if you have MyChart) OR A paper copy in the mail If you have any lab test that is abnormal or we need to change your treatment, we will call you to review the results.  Testing/Procedures: Your physician has requested that you have an ankle brachial index (ABI). During this test an ultrasound and blood pressure cuff are used to evaluate the arteries that supply the arms and legs with blood.  Allow thirty minutes for this exam.  There are no restrictions or special instructions.  This will take place at [address] [facility] [address], [place] [address]   Your physician has requested that you have a lower extremity arterial duplex. During this test, ultrasound is used to evaluate arterial blood flow in the legs. Allow one hour for this exam. There are no restrictions or special instructions. This will take place at [address] [facility], [place] [address]  Please note: We ask at that you not bring children with you during ultrasound (echo/ vascular) testing. Due to room size and safety concerns, children are not allowed in the ultrasound rooms during exams. Our front office staff cannot provide observation of children in our lobby area while testing is being conducted. An adult accompanying a patient to their appointment will only be allowed in the ultrasound room at the discretion of the ultrasound technician under special circumstances. We apologize for any inconvenience.   Follow-Up: At [facility], you and your health needs are our priority.  As part of our continuing mission to provide you with exceptional heart care, our providers are all part of one team.  This team includes your primary Cardiologist (physician) and Advanced Practice Providers or APPs (Physician Assistants and Nurse Practitioners) who all work together to provide you with the care you need, when you need it.  Your next appointment:   3 month(s)  Provider:   You may see Yvonne Kendall, MD or Eula Listen, PA-C

## 2023-05-20 LAB — CBC
Hematocrit: 38.1 % (ref 34.0–46.6)
Hemoglobin: 13.4 g/dL (ref 11.1–15.9)
MCH: 34.1 pg — ABNORMAL HIGH (ref 26.6–33.0)
MCHC: 35.2 g/dL (ref 31.5–35.7)
MCV: 97 fL (ref 79–97)
Platelets: 150 10*3/uL (ref 150–450)
RBC: 3.93 x10E6/uL (ref 3.77–5.28)
RDW: 15.2 % (ref 11.7–15.4)
WBC: 5.3 10*3/uL (ref 3.4–10.8)

## 2023-05-20 LAB — BASIC METABOLIC PANEL WITH GFR
BUN/Creatinine Ratio: 16 (ref 12–28)
BUN: 16 mg/dL (ref 8–27)
CO2: 23 mmol/L (ref 20–29)
Calcium: 9.9 mg/dL (ref 8.7–10.3)
Chloride: 99 mmol/L (ref 96–106)
Creatinine, Ser: 1.02 mg/dL — ABNORMAL HIGH (ref 0.57–1.00)
Glucose: 100 mg/dL — ABNORMAL HIGH (ref 70–99)
Potassium: 4 mmol/L (ref 3.5–5.2)
Sodium: 141 mmol/L (ref 134–144)
eGFR: 62 mL/min/{1.73_m2} (ref >59)

## 2023-05-27 ENCOUNTER — Ambulatory Visit: Attending: Internal Medicine | Admitting: Internal Medicine

## 2023-05-27 ENCOUNTER — Ambulatory Visit: Admit: 2023-05-27 | Payer: 59

## 2023-05-27 ENCOUNTER — Other Ambulatory Visit (HOSPITAL_COMMUNITY): Payer: Self-pay

## 2023-05-27 VITALS — BP 130/65 | HR 67 | Wt 204.2 lb

## 2023-05-27 DIAGNOSIS — E0590 Thyrotoxicosis, unspecified without thyrotoxic crisis or storm: Secondary | ICD-10-CM | POA: Diagnosis not present

## 2023-05-27 DIAGNOSIS — I4819 Other persistent atrial fibrillation: Secondary | ICD-10-CM | POA: Diagnosis not present

## 2023-05-27 DIAGNOSIS — Z8249 Family history of ischemic heart disease and other diseases of the circulatory system: Secondary | ICD-10-CM | POA: Diagnosis not present

## 2023-05-27 DIAGNOSIS — I25110 Atherosclerotic heart disease of native coronary artery with unstable angina pectoris: Secondary | ICD-10-CM

## 2023-05-27 DIAGNOSIS — F17210 Nicotine dependence, cigarettes, uncomplicated: Secondary | ICD-10-CM | POA: Diagnosis not present

## 2023-05-27 DIAGNOSIS — Z8616 Personal history of COVID-19: Secondary | ICD-10-CM | POA: Insufficient documentation

## 2023-05-27 DIAGNOSIS — I5032 Chronic diastolic (congestive) heart failure: Secondary | ICD-10-CM | POA: Diagnosis not present

## 2023-05-27 DIAGNOSIS — Z79899 Other long term (current) drug therapy: Secondary | ICD-10-CM | POA: Insufficient documentation

## 2023-05-27 DIAGNOSIS — J449 Chronic obstructive pulmonary disease, unspecified: Secondary | ICD-10-CM | POA: Diagnosis not present

## 2023-05-27 DIAGNOSIS — I340 Nonrheumatic mitral (valve) insufficiency: Secondary | ICD-10-CM | POA: Diagnosis not present

## 2023-05-27 DIAGNOSIS — I272 Pulmonary hypertension, unspecified: Secondary | ICD-10-CM | POA: Insufficient documentation

## 2023-05-27 DIAGNOSIS — Z955 Presence of coronary angioplasty implant and graft: Secondary | ICD-10-CM | POA: Insufficient documentation

## 2023-05-27 DIAGNOSIS — Z7901 Long term (current) use of anticoagulants: Secondary | ICD-10-CM | POA: Insufficient documentation

## 2023-05-27 DIAGNOSIS — I2510 Atherosclerotic heart disease of native coronary artery without angina pectoris: Secondary | ICD-10-CM | POA: Diagnosis not present

## 2023-05-27 DIAGNOSIS — G4733 Obstructive sleep apnea (adult) (pediatric): Secondary | ICD-10-CM | POA: Diagnosis not present

## 2023-05-27 DIAGNOSIS — Z6832 Body mass index (BMI) 32.0-32.9, adult: Secondary | ICD-10-CM | POA: Diagnosis not present

## 2023-05-27 DIAGNOSIS — Z720 Tobacco use: Secondary | ICD-10-CM

## 2023-05-27 SURGERY — COLONOSCOPY WITH PROPOFOL
Anesthesia: General
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
## 2023-05-27 NOTE — Patient Instructions (Signed)
 Medication Changes:  None, continue current medications  Our pharmacy team will work on getting you approved for weight loss medication  Testing/Procedures:  Your provider has recommended that you have a home sleep study (Itamar Test).  We have provided you with the equipment in our office today. Please go ahead and download the app. DO NOT OPEN OR TAMPER WITH THE BOX UNTIL WE ADVISE YOU TO DO SO. Once insurance has approved the test our office will call you with PIN number [phone] and approval to proceed with testing. Once you have completed the test you just dispose of the equipment, the information is automatically uploaded to [place] via blue-tooth technology. If your test is positive for sleep apnea and you need a home CPAP machine you will be contacted by Dr Norris Cross office [facility]) to set this up.  Special Instructions // Education:  Do the following things EVERYDAY: Weigh yourself in the morning before breakfast. Write it down and keep it in a log. Take your medicines as prescribed Eat low salt foods--Limit salt (sodium) to 2000 mg per day.  Stay as active as you can everyday Limit all fluids for the day to less than 2 liters   Follow-Up in: 6 weeks    If you have any questions or concerns before your next appointment please send [place] a message through [place] or call our office at [phone], If it is after office hours your call will be answered by our answering service and directed appropriately.     At the [facility], you and your health needs are our priority. We have a designated team specialized in the treatment of Heart Failure. This Care Team includes your primary Heart Failure Specialized Cardiologist (physician), Advanced Practice Providers (APPs- Physician Assistants and Nurse Practitioners), and Pharmacist who all work together to provide you with the care you need, when you need it.   You may see any of the following providers on your designated  Care Team at your next follow up:  Dr. Arvilla Meres Dr. Marca Ancona Dr. Dorthula Nettles Dr. Theresia Bough Tonye Becket, NP Robbie Lis, [place] [address] [place], [place] Brynda Peon, NP [place] Lee, NP Clarisa Kindred, NP Enos Fling, PharmD

## 2023-05-27 NOTE — Progress Notes (Signed)
 ITAMAR home sleep study given to patient, all instructions explained, waiver signed, and CLOUDPAT registration complete.

## 2023-05-27 NOTE — Progress Notes (Signed)
 Height: 5'6"    Weight: 204 lb 4 oz BMI: 32.97  Today's Date: 05/27/23  STOP BANG RISK ASSESSMENT S (snore) Have you been told that you snore?     YES   T (tired) Are you often tired, fatigued, or sleepy during the day?   YES  O (obstruction) Do you stop breathing, choke, or gasp during sleep? NO   P (pressure) Do you have or are you being treated for high blood pressure? YES   B (BMI) Is your body index greater than 35 kg/m? NO   A (age) Are you 64 years old or older? YES   N (neck) Do you have a neck circumference greater than 16 inches?      G (gender) Are you a female? NO   TOTAL STOP/BANG "YES" ANSWERS 4                                                                       For Office Use Only              Procedure Order Form    YES to 3+ Stop Bang questions OR two clinical symptoms - patient qualifies for WatchPAT (CPT 95800)      Clinical Notes: Will consult Sleep Specialist and refer for management of therapy due to patient increased risk of Sleep Apnea. Ordering a sleep study due to the following two clinical symptoms: Excessive daytime sleepiness G47.10 / Loud snoring R06.83

## 2023-05-30 ENCOUNTER — Telehealth: Payer: Self-pay | Admitting: Pharmacist

## 2023-05-30 ENCOUNTER — Other Ambulatory Visit (HOSPITAL_COMMUNITY): Payer: Self-pay

## 2023-05-30 ENCOUNTER — Other Ambulatory Visit: Payer: Self-pay

## 2023-05-30 MED ORDER — MOUNJARO 2.5 MG/0.5ML ~~LOC~~ SOAJ
2.5000 mg | SUBCUTANEOUS | 0 refills | Status: DC
  Filled 2023-05-30: qty 2, 28d supply, fill #0

## 2023-05-30 NOTE — Telephone Encounter (Signed)
 Prior authorization for Mounjaro approved. Copay is $0. Patient will pick up from [facility] this Thursday and present to clinic for education. Patient denies personal history of medullary thyroid cancer. Reported that a relative had thyroid cancer, but was unsure whether or not it was medullary. Patient was informed of the risks involved and in agreement with Dr. Julane Ny, will initiate Mounjaro 2.5 mg weekly.
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
## 2023-06-01 ENCOUNTER — Telehealth: Payer: Self-pay | Admitting: Pharmacist

## 2023-06-01 NOTE — Telephone Encounter (Incomplete)
 Called to confirm/remind patient of their appointment at the [facility] on 06/02/23.   Appointment:   [x] Confirmed  [] Left mess   [] No answer/No voice mail  [] Phone not in service  Patient reminded to bring all medications and/or complete list.  Confirmed patient has transportation. Gave directions, instructed to utilize valet parking.

## 2023-06-02 ENCOUNTER — Telehealth: Payer: Self-pay

## 2023-06-02 ENCOUNTER — Ambulatory Visit: Attending: Cardiology | Admitting: Pharmacist

## 2023-06-02 VITALS — BP 122/66 | HR 72 | Wt 205.8 lb

## 2023-06-02 DIAGNOSIS — Z7985 Long-term (current) use of injectable non-insulin antidiabetic drugs: Secondary | ICD-10-CM | POA: Insufficient documentation

## 2023-06-02 DIAGNOSIS — J449 Chronic obstructive pulmonary disease, unspecified: Secondary | ICD-10-CM | POA: Insufficient documentation

## 2023-06-02 DIAGNOSIS — E669 Obesity, unspecified: Secondary | ICD-10-CM | POA: Diagnosis not present

## 2023-06-02 DIAGNOSIS — Z7901 Long term (current) use of anticoagulants: Secondary | ICD-10-CM | POA: Diagnosis not present

## 2023-06-02 DIAGNOSIS — E663 Overweight: Secondary | ICD-10-CM

## 2023-06-02 DIAGNOSIS — I5022 Chronic systolic (congestive) heart failure: Secondary | ICD-10-CM | POA: Diagnosis not present

## 2023-06-02 DIAGNOSIS — I2510 Atherosclerotic heart disease of native coronary artery without angina pectoris: Secondary | ICD-10-CM | POA: Insufficient documentation

## 2023-06-02 DIAGNOSIS — Z955 Presence of coronary angioplasty implant and graft: Secondary | ICD-10-CM | POA: Diagnosis not present

## 2023-06-02 DIAGNOSIS — E1122 Type 2 diabetes mellitus with diabetic chronic kidney disease: Secondary | ICD-10-CM | POA: Insufficient documentation

## 2023-06-02 DIAGNOSIS — N189 Chronic kidney disease, unspecified: Secondary | ICD-10-CM | POA: Diagnosis not present

## 2023-06-02 DIAGNOSIS — Z6833 Body mass index (BMI) 33.0-33.9, adult: Secondary | ICD-10-CM | POA: Diagnosis not present

## 2023-06-02 DIAGNOSIS — I4891 Unspecified atrial fibrillation: Secondary | ICD-10-CM | POA: Diagnosis not present

## 2023-06-02 DIAGNOSIS — Z7902 Long term (current) use of antithrombotics/antiplatelets: Secondary | ICD-10-CM | POA: Diagnosis not present

## 2023-06-02 DIAGNOSIS — Z79899 Other long term (current) drug therapy: Secondary | ICD-10-CM | POA: Diagnosis not present

## 2023-06-02 DIAGNOSIS — I130 Hypertensive heart and chronic kidney disease with heart failure and stage 1 through stage 4 chronic kidney disease, or unspecified chronic kidney disease: Secondary | ICD-10-CM | POA: Insufficient documentation

## 2023-06-02 NOTE — Telephone Encounter (Signed)
 Spoke to pt and told her to complete the itamar sleep study. Reviewed instructions again. No further questions at this time.   Itamar sleep study: no precert required

## 2023-06-04 ENCOUNTER — Encounter (INDEPENDENT_AMBULATORY_CARE_PROVIDER_SITE_OTHER): Payer: Self-pay | Admitting: Cardiology

## 2023-06-04 DIAGNOSIS — G4733 Obstructive sleep apnea (adult) (pediatric): Secondary | ICD-10-CM | POA: Diagnosis not present

## 2023-06-06 ENCOUNTER — Ambulatory Visit: Attending: Internal Medicine

## 2023-06-06 DIAGNOSIS — E663 Overweight: Secondary | ICD-10-CM

## 2023-06-06 NOTE — Progress Notes (Signed)
   SLEEP STUDY REPORT Patient Information Study Date: 06/04/2023 Patient Name: Ariana Riggs Patient ID: [id number] Birth Date: [birth year] Age: 63 Gender: Female BMI: 33.0 (W=205 lb, H=5' 6'') Stopbang: 4 Referring Physician: Jules Oar, MD  TEST DESCRIPTION: Home sleep apnea testing was completed using the WatchPat, a Type 1 device, utilizing  peripheral arterial tonometry (PAT), chest movement, actigraphy, pulse oximetry, pulse rate, body position and snore.  AHI was calculated with apnea and hypopnea using valid sleep time as the denominator. RDI includes apneas,  hypopneas, and RERAs. The data acquired and the scoring of sleep and all associated events were performed in  accordance with the recommended standards and specifications as outlined in the AASM Manual for the Scoring of  Sleep and Associated Events 2.2.0 (2015).  FINDINGS:  1. Severe Obstructive Sleep Apnea with AHI 48.8/hr.   2. No Central Sleep Apnea with pAHIc 3.3/hr.  3. Oxygen desaturations as low as 72%.  4. Severe snoring was present. O2 sats were < 88% for 493.6 min.  5. Total sleep time was 7 hrs and 48 min.  6. 18.1% of total sleep time was spent in REM sleep.   7. Normal sleep onset latency at 23 min.   8. Shortened REM sleep onset latency at 35 min.   9. Total awakenings were 17.  10. Arrhythmia detection: None.  DIAGNOSIS:  Severe Obstructive Sleep Apnea (G47.33) Nocturnal Hypoxemia  RECOMMENDATIONS: 1. Clinical correlation of these findings is necessary. The decision to treat obstructive sleep apnea (OSA) is usually  based on the presence of apnea symptoms or the presence of associated medical conditions such as Hypertension,  Congestive Heart Failure, Atrial Fibrillation or Obesity. The most common symptoms of OSA are snoring, gasping for  breath while sleeping, daytime sleepiness and fatigue.  2. Initiating apnea therapy is recommended given the presence of symptoms and/or associated  conditions.  Recommend proceeding with one of the following:  a. Auto-CPAP therapy with a pressure range of 5-20cm H2O.  b. An oral appliance (OA) that can be obtained from certain dentists with expertise in sleep medicine. These are  primarily of use in non-obese patients with mild and moderate disease.  c. An ENT consultation which may be useful to look for specific causes of obstruction and possible treatment  options.  d. If patient is intolerant to PAP therapy, consider referral to ENT for evaluation for hypoglossal nerve stimulator.  3. Close follow-up is necessary to ensure success with CPAP or oral appliance therapy for maximum benefit . 4. A follow-up oximetry study on CPAP is recommended to assess the adequacy of therapy and determine the need  for supplemental oxygen or the potential need for Bi-level therapy. An arterial blood gas to determine the adequacy of  baseline ventilation and oxygenation should also be considered. 5. Healthy sleep recommendations include: adequate nightly sleep (normal 7-9 hrs/night), avoidance of caffeine  after  noon and alcohol near bedtime, and maintaining a sleep environment that is cool, dark and quiet. 6. Weight loss for overweight patients is recommended. Even modest amounts of weight loss can significantly  improve the severity of sleep apnea. 7. Snoring recommendations include: weight loss where appropriate, side sleeping, and avoidance of alcohol before  bed. 8. Operation of motor vehicle should be avoided when sleepy.  Signature: Gaylyn Keas, MD; [facility]; Diplomat, American Board of Sleep  Medicine Electronically Signed: 06/06/2023 3:42:20 PM

## 2023-06-06 NOTE — Procedures (Signed)
   SLEEP STUDY REPORT Patient Information Study Date: 06/04/2023 Patient Name: Ariana Riggs Patient ID: [id number] Birth Date: [birth year] Age: 63 Gender: Female BMI: 33.0 (W=205 lb, H=5' 6'') Stopbang: 4 Referring Physician: Jules Oar, MD  TEST DESCRIPTION: Home sleep apnea testing was completed using the WatchPat, a Type 1 device, utilizing  peripheral arterial tonometry (PAT), chest movement, actigraphy, pulse oximetry, pulse rate, body position and snore.  AHI was calculated with apnea and hypopnea using valid sleep time as the denominator. RDI includes apneas,  hypopneas, and RERAs. The data acquired and the scoring of sleep and all associated events were performed in  accordance with the recommended standards and specifications as outlined in the AASM Manual for the Scoring of  Sleep and Associated Events 2.2.0 (2015).  FINDINGS:  1. Severe Obstructive Sleep Apnea with AHI 48.8/hr.   2. No Central Sleep Apnea with pAHIc 3.3/hr.  3. Oxygen desaturations as low as 72%.  4. Severe snoring was present. O2 sats were < 88% for 493.6 min.  5. Total sleep time was 7 hrs and 48 min.  6. 18.1% of total sleep time was spent in REM sleep.   7. Normal sleep onset latency at 23 min.   8. Shortened REM sleep onset latency at 35 min.   9. Total awakenings were 17.  10. Arrhythmia detection: None.  DIAGNOSIS:  Severe Obstructive Sleep Apnea (G47.33) Nocturnal Hypoxemia  RECOMMENDATIONS: 1. Clinical correlation of these findings is necessary. The decision to treat obstructive sleep apnea (OSA) is usually  based on the presence of apnea symptoms or the presence of associated medical conditions such as Hypertension,  Congestive Heart Failure, Atrial Fibrillation or Obesity. The most common symptoms of OSA are snoring, gasping for  breath while sleeping, daytime sleepiness and fatigue.  2. Initiating apnea therapy is recommended given the presence of symptoms and/or associated  conditions.  Recommend proceeding with one of the following:  a. Auto-CPAP therapy with a pressure range of 5-20cm H2O.  b. An oral appliance (OA) that can be obtained from certain dentists with expertise in sleep medicine. These are  primarily of use in non-obese patients with mild and moderate disease.  c. An ENT consultation which may be useful to look for specific causes of obstruction and possible treatment  options.  d. If patient is intolerant to PAP therapy, consider referral to ENT for evaluation for hypoglossal nerve stimulator.  3. Close follow-up is necessary to ensure success with CPAP or oral appliance therapy for maximum benefit . 4. A follow-up oximetry study on CPAP is recommended to assess the adequacy of therapy and determine the need  for supplemental oxygen or the potential need for Bi-level therapy. An arterial blood gas to determine the adequacy of  baseline ventilation and oxygenation should also be considered. 5. Healthy sleep recommendations include: adequate nightly sleep (normal 7-9 hrs/night), avoidance of caffeine  after  noon and alcohol near bedtime, and maintaining a sleep environment that is cool, dark and quiet. 6. Weight loss for overweight patients is recommended. Even modest amounts of weight loss can significantly  improve the severity of sleep apnea. 7. Snoring recommendations include: weight loss where appropriate, side sleeping, and avoidance of alcohol before  bed. 8. Operation of motor vehicle should be avoided when sleepy.  Signature: Gaylyn Keas, MD; [facility]; Diplomat, American Board of Sleep  Medicine Electronically Signed: 06/06/2023 3:42:20 PM

## 2023-06-07 ENCOUNTER — Other Ambulatory Visit: Payer: Self-pay | Admitting: Cardiology

## 2023-06-13 ENCOUNTER — Other Ambulatory Visit: Payer: Self-pay | Admitting: Surgery

## 2023-06-13 DIAGNOSIS — Z853 Personal history of malignant neoplasm of breast: Secondary | ICD-10-CM

## 2023-06-16 ENCOUNTER — Ambulatory Visit

## 2023-06-16 ENCOUNTER — Ambulatory Visit: Attending: Physician Assistant

## 2023-06-16 DIAGNOSIS — I739 Peripheral vascular disease, unspecified: Secondary | ICD-10-CM | POA: Diagnosis not present

## 2023-06-20 LAB — VAS US ABI WITH/WO TBI
Left ABI: 1.02
Right ABI: 1.01

## 2023-06-22 ENCOUNTER — Other Ambulatory Visit: Payer: Self-pay

## 2023-06-22 ENCOUNTER — Telehealth: Payer: Self-pay | Admitting: Pharmacist

## 2023-06-22 MED ORDER — MOUNJARO 5 MG/0.5ML ~~LOC~~ SOAJ
5.0000 mg | SUBCUTANEOUS | 0 refills | Status: DC
  Filled 2023-06-22: qty 2, 28d supply, fill #0

## 2023-06-22 NOTE — Telephone Encounter (Signed)
 Spoke with patient via telephone. She is tolerating Mounjaro  well. Dose increased to 5 mg weekly. Follow-up in 3 weeks for further titration.

## 2023-06-23 ENCOUNTER — Encounter: Payer: Self-pay | Admitting: Internal Medicine

## 2023-06-23 ENCOUNTER — Ambulatory Visit: Admitting: Internal Medicine

## 2023-06-23 VITALS — BP 120/78 | HR 75 | Temp 98.5°F | Ht 66.0 in | Wt 201.0 lb

## 2023-06-23 DIAGNOSIS — R5381 Other malaise: Secondary | ICD-10-CM

## 2023-06-23 DIAGNOSIS — R0602 Shortness of breath: Secondary | ICD-10-CM

## 2023-06-23 DIAGNOSIS — G4733 Obstructive sleep apnea (adult) (pediatric): Secondary | ICD-10-CM | POA: Diagnosis not present

## 2023-06-23 DIAGNOSIS — R0600 Dyspnea, unspecified: Secondary | ICD-10-CM | POA: Diagnosis not present

## 2023-06-23 DIAGNOSIS — F17210 Nicotine dependence, cigarettes, uncomplicated: Secondary | ICD-10-CM

## 2023-06-23 DIAGNOSIS — E669 Obesity, unspecified: Secondary | ICD-10-CM

## 2023-06-23 DIAGNOSIS — Z6832 Body mass index (BMI) 32.0-32.9, adult: Secondary | ICD-10-CM

## 2023-06-23 DIAGNOSIS — J449 Chronic obstructive pulmonary disease, unspecified: Secondary | ICD-10-CM

## 2023-06-23 DIAGNOSIS — F17200 Nicotine dependence, unspecified, uncomplicated: Secondary | ICD-10-CM

## 2023-06-23 MED ORDER — TRELEGY ELLIPTA 100-62.5-25 MCG/ACT IN AEPB: 1.0000 | INHALATION_SPRAY | Freq: Every day | RESPIRATORY_TRACT | 10 refills | Status: DC

## 2023-06-23 NOTE — Addendum Note (Signed)
 Addended by: Fina Heizer J on: 06/23/2023 03:24 PM   Modules accepted: Orders

## 2023-06-23 NOTE — Patient Instructions (Signed)
 Please stop smoking!!  Assessment of COPD Plan to start Trelegy inhaler 1 puff once a day Please rinse mouth out after use We will obtain breathing test pulmonary function testing Continue to use albuterol  as needed Avoid Allergens and Irritants Avoid secondhand smoke Avoid SICK contacts Recommend  Masking  when appropriate Recommend Keep up-to-date with vaccinations   Recommend starting CPAP for severe sleep apnea Referral to DME company [employer] RESMED AIR-TOUCH FIT N30i MASK START AUTO CPAP 4-16 cm h20  Recommend lung cancer screening program  Follow-up with cardiology
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
## 2023-06-26 ENCOUNTER — Other Ambulatory Visit: Payer: Self-pay | Admitting: Internal Medicine

## 2023-07-01 ENCOUNTER — Ambulatory Visit: Admitting: Pulmonary Disease

## 2023-07-15 ENCOUNTER — Telehealth: Payer: Self-pay

## 2023-07-15 NOTE — Telephone Encounter (Signed)
-----   Message from Gaylyn Keas sent at 06/06/2023  3:43 PM EDT ----- Please let patient know that they have sleep apnea.  Recommend therapeutic CPAP titration for treatment of patient's sleep disordered breathing.

## 2023-07-15 NOTE — Telephone Encounter (Signed)
 Left VM with callback number for patient to receive sleep study results and recommendations.

## 2023-07-18 ENCOUNTER — Telehealth: Payer: Self-pay | Admitting: Internal Medicine

## 2023-07-18 NOTE — Telephone Encounter (Signed)
 Called to confirm/remind patient of their appointment at the [facility] on 07/19/23.   Appointment:   [x] Confirmed  [] Left mess   [] No answer/No voice mail  [] VM Full/unable to leave message  [] Phone not in service  Patient reminded to bring all medications and/or complete list.  Confirmed patient has transportation. Gave directions, instructed to utilize valet parking.

## 2023-07-19 ENCOUNTER — Telehealth: Payer: Self-pay

## 2023-07-19 ENCOUNTER — Other Ambulatory Visit: Payer: Self-pay

## 2023-07-19 ENCOUNTER — Ambulatory Visit: Attending: Internal Medicine | Admitting: Internal Medicine

## 2023-07-19 ENCOUNTER — Other Ambulatory Visit: Payer: Self-pay | Admitting: Pharmacist

## 2023-07-19 VITALS — BP 126/65 | HR 79 | Wt 200.0 lb

## 2023-07-19 DIAGNOSIS — I272 Pulmonary hypertension, unspecified: Secondary | ICD-10-CM

## 2023-07-19 DIAGNOSIS — Z955 Presence of coronary angioplasty implant and graft: Secondary | ICD-10-CM | POA: Diagnosis not present

## 2023-07-19 DIAGNOSIS — Z720 Tobacco use: Secondary | ICD-10-CM

## 2023-07-19 DIAGNOSIS — I340 Nonrheumatic mitral (valve) insufficiency: Secondary | ICD-10-CM | POA: Insufficient documentation

## 2023-07-19 DIAGNOSIS — G4733 Obstructive sleep apnea (adult) (pediatric): Secondary | ICD-10-CM | POA: Diagnosis not present

## 2023-07-19 DIAGNOSIS — E119 Type 2 diabetes mellitus without complications: Secondary | ICD-10-CM | POA: Insufficient documentation

## 2023-07-19 DIAGNOSIS — J4489 Other specified chronic obstructive pulmonary disease: Secondary | ICD-10-CM | POA: Diagnosis not present

## 2023-07-19 DIAGNOSIS — Z6832 Body mass index (BMI) 32.0-32.9, adult: Secondary | ICD-10-CM | POA: Diagnosis not present

## 2023-07-19 DIAGNOSIS — I4819 Other persistent atrial fibrillation: Secondary | ICD-10-CM

## 2023-07-19 DIAGNOSIS — F17210 Nicotine dependence, cigarettes, uncomplicated: Secondary | ICD-10-CM | POA: Insufficient documentation

## 2023-07-19 DIAGNOSIS — R188 Other ascites: Secondary | ICD-10-CM

## 2023-07-19 DIAGNOSIS — I25119 Atherosclerotic heart disease of native coronary artery with unspecified angina pectoris: Secondary | ICD-10-CM | POA: Diagnosis not present

## 2023-07-19 DIAGNOSIS — Z79899 Other long term (current) drug therapy: Secondary | ICD-10-CM | POA: Diagnosis not present

## 2023-07-19 DIAGNOSIS — J449 Chronic obstructive pulmonary disease, unspecified: Secondary | ICD-10-CM

## 2023-07-19 DIAGNOSIS — Z7901 Long term (current) use of anticoagulants: Secondary | ICD-10-CM | POA: Insufficient documentation

## 2023-07-19 DIAGNOSIS — Z7985 Long-term (current) use of injectable non-insulin antidiabetic drugs: Secondary | ICD-10-CM | POA: Insufficient documentation

## 2023-07-19 DIAGNOSIS — K7460 Unspecified cirrhosis of liver: Secondary | ICD-10-CM

## 2023-07-19 DIAGNOSIS — I5032 Chronic diastolic (congestive) heart failure: Secondary | ICD-10-CM

## 2023-07-19 DIAGNOSIS — I25110 Atherosclerotic heart disease of native coronary artery with unstable angina pectoris: Secondary | ICD-10-CM

## 2023-07-19 MED ORDER — MOUNJARO 7.5 MG/0.5ML ~~LOC~~ SOAJ
7.5000 mg | SUBCUTANEOUS | 0 refills | Status: DC
  Filled 2023-07-19 (×2): qty 2, 28d supply, fill #0

## 2023-07-19 MED ORDER — MOUNJARO 7.5 MG/0.5ML ~~LOC~~ SOAJ: 7.5000 mg | SUBCUTANEOUS | 0 refills | Status: DC

## 2023-07-19 MED ORDER — JARDIANCE 10 MG PO TABS: 10.0000 mg | ORAL_TABLET | Freq: Every day | ORAL | 11 refills | Status: DC

## 2023-07-19 NOTE — Addendum Note (Signed)
 Addended by: Margean Sheehan on: 07/19/2023 03:32 PM   Modules accepted: Orders

## 2023-07-19 NOTE — Patient Instructions (Signed)
 Medication Changes:  START Jardiance 10mg  (1 tab) daily  Testing/Procedures:  Your physician has requested that you have an echocardiogram. Echocardiography is a painless test that uses sound waves to create images of your heart. It provides your doctor with information about the size and shape of your heart and how well your heart's chambers and valves are working. This procedure takes approximately one hour. There are no restrictions for this procedure. Please do NOT wear cologne, perfume, aftershave, or lotions (deodorant is allowed). Please arrive 15 minutes prior to your appointment time.  Please note: We ask at that you not bring children with you during ultrasound (echo/ vascular) testing. Due to room size and safety concerns, children are not allowed in the ultrasound rooms during exams. Our front office staff cannot provide observation of children in our lobby area while testing is being conducted. An adult accompanying a patient to their appointment will only be allowed in the ultrasound room at the discretion of the ultrasound technician under special circumstances. We apologize for any inconvenience.   Follow-Up in: Please follow up with the [facility] in 3 months with Dr. Julane Ny. We do not currently have that schedule. Please give us  a call in August in order to schedule your appointment for September.   At the [facility], you and your health needs are our priority. We have a designated team specialized in the treatment of Heart Failure. This Care Team includes your primary Heart Failure Specialized Cardiologist (physician), Advanced Practice Providers (APPs- Physician Assistants and Nurse Practitioners), and Pharmacist who all work together to provide you with the care you need, when you need it.   You may see any of the following providers on your designated Care Team at your next follow up:  Dr. Jules Oar Dr. Peder Bourdon Dr.  Alwin Baars Dr. Judyth Nunnery Shawnee Dellen, FNP Bevely Brush, RPH-CPP  Please be sure to bring in all your medications bottles to every appointment.   Need to Contact Us :  If you have any questions or concerns before your next appointment please send us  a message through [place] or call our office at [phone].    TO LEAVE A MESSAGE FOR THE NURSE SELECT OPTION 2, PLEASE LEAVE A MESSAGE INCLUDING: YOUR NAME DATE OF BIRTH CALL BACK NUMBER REASON FOR CALL**this is important as we prioritize the call backs  YOU WILL RECEIVE A CALL BACK THE SAME DAY AS LONG AS YOU CALL BEFORE 4:00 PM

## 2023-07-19 NOTE — Telephone Encounter (Signed)
 Called patient to notify of sleep study results and recommendations. Patient informed sleep coordinator that she received a CPAP device on Friday 07/15/23 but cannot remember the name of the company or the provider that will follow her sleep therapy. Patient stated she will call sleep coordinator back with correct information regarding her sleep therapy.
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
## 2023-07-19 NOTE — Telephone Encounter (Signed)
-----   Message from Gaylyn Keas sent at 06/06/2023  3:43 PM EDT ----- Please let patient know that they have sleep apnea.  Recommend therapeutic CPAP titration for treatment of patient's sleep disordered breathing.

## 2023-07-21 ENCOUNTER — Telehealth: Payer: Self-pay | Admitting: Internal Medicine

## 2023-07-21 ENCOUNTER — Other Ambulatory Visit: Payer: Self-pay | Admitting: Internal Medicine

## 2023-07-21 NOTE — Telephone Encounter (Signed)
 Insurance needs appointment moved out between 08/14/2023 to 10/12/2023 due to CPAP compliance.

## 2023-07-29 ENCOUNTER — Telehealth: Payer: Self-pay | Admitting: Internal Medicine

## 2023-07-29 NOTE — Telephone Encounter (Signed)
 Patient needs PFT prior to 10/06/2023.

## 2023-08-01 ENCOUNTER — Other Ambulatory Visit: Payer: Self-pay | Admitting: Internal Medicine

## 2023-08-01 ENCOUNTER — Other Ambulatory Visit: Payer: Self-pay | Admitting: Cardiology

## 2023-08-02 ENCOUNTER — Other Ambulatory Visit: Payer: Self-pay | Admitting: Gastroenterology

## 2023-08-02 DIAGNOSIS — K7030 Alcoholic cirrhosis of liver without ascites: Secondary | ICD-10-CM

## 2023-08-09 ENCOUNTER — Ambulatory Visit
Admission: RE | Admit: 2023-08-09 | Discharge: 2023-08-09 | Disposition: A | Discharge status: Home / Self Care | Source: Ambulatory Visit | Attending: Gastroenterology | Admitting: Gastroenterology

## 2023-08-09 DIAGNOSIS — K7030 Alcoholic cirrhosis of liver without ascites: Secondary | ICD-10-CM | POA: Insufficient documentation

## 2023-08-11 ENCOUNTER — Other Ambulatory Visit: Payer: Self-pay

## 2023-08-11 ENCOUNTER — Ambulatory Visit: Admitting: Internal Medicine

## 2023-08-11 ENCOUNTER — Telehealth (HOSPITAL_COMMUNITY): Payer: Self-pay | Admitting: Pharmacist

## 2023-08-11 MED ORDER — MOUNJARO 10 MG/0.5ML ~~LOC~~ SOAJ
10.0000 mg | SUBCUTANEOUS | 0 refills | Status: DC
  Filled 2023-08-11 – 2023-08-12 (×2): qty 2, 28d supply, fill #0

## 2023-08-11 MED ORDER — MOUNJARO 10 MG/0.5ML ~~LOC~~ SOAJ: 10.0000 mg | SUBCUTANEOUS | 0 refills | Status: DC

## 2023-08-11 NOTE — Telephone Encounter (Signed)
 Patient is tolerating Mounjaro  7.5 mg weekly well, will increase to 10 mg weekly.

## 2023-08-12 ENCOUNTER — Other Ambulatory Visit: Payer: Self-pay

## 2023-08-12 ENCOUNTER — Ambulatory Visit
Admission: RE | Admit: 2023-08-12 | Discharge: 2023-08-12 | Disposition: A | Discharge status: Home / Self Care | Source: Ambulatory Visit | Attending: Surgery

## 2023-08-12 ENCOUNTER — Ambulatory Visit
Admission: RE | Admit: 2023-08-12 | Discharge: 2023-08-12 | Disposition: A | Discharge status: Home / Self Care | Source: Ambulatory Visit | Attending: Surgery | Admitting: Surgery

## 2023-08-12 DIAGNOSIS — Z853 Personal history of malignant neoplasm of breast: Secondary | ICD-10-CM | POA: Diagnosis present

## 2023-08-15 ENCOUNTER — Encounter: Payer: Self-pay | Admitting: Medical

## 2023-08-15 ENCOUNTER — Ambulatory Visit: Attending: Medical | Admitting: Medical

## 2023-08-15 ENCOUNTER — Telehealth: Payer: Self-pay | Admitting: Pharmacy Technician

## 2023-08-15 VITALS — BP 130/60 | Ht 66.0 in | Wt 198.6 lb

## 2023-08-15 DIAGNOSIS — I2510 Atherosclerotic heart disease of native coronary artery without angina pectoris: Secondary | ICD-10-CM

## 2023-08-15 DIAGNOSIS — G4733 Obstructive sleep apnea (adult) (pediatric): Secondary | ICD-10-CM

## 2023-08-15 DIAGNOSIS — I340 Nonrheumatic mitral (valve) insufficiency: Secondary | ICD-10-CM | POA: Diagnosis not present

## 2023-08-15 DIAGNOSIS — I480 Paroxysmal atrial fibrillation: Secondary | ICD-10-CM

## 2023-08-15 DIAGNOSIS — E663 Overweight: Secondary | ICD-10-CM

## 2023-08-15 DIAGNOSIS — I272 Pulmonary hypertension, unspecified: Secondary | ICD-10-CM | POA: Diagnosis not present

## 2023-08-15 DIAGNOSIS — E782 Mixed hyperlipidemia: Secondary | ICD-10-CM

## 2023-08-15 DIAGNOSIS — I5032 Chronic diastolic (congestive) heart failure: Secondary | ICD-10-CM | POA: Diagnosis not present

## 2023-08-15 MED ORDER — JARDIANCE 10 MG PO TABS: 10.0000 mg | ORAL_TABLET | Freq: Every day | ORAL | 3 refills | Status: DC

## 2023-08-15 NOTE — Telephone Encounter (Signed)
 Pharmacy Patient Advocate Encounter   Received notification from Onbase that prior authorization for JARDIANCE  10MG  is required/requested.   Insurance verification completed.   The patient is insured through [employer] .   Per test claim: PA required; PA submitted to above mentioned insurance via CoverMyMeds Key/confirmation #/EOC [id number] Status is pending

## 2023-08-15 NOTE — Telephone Encounter (Signed)
 Pharmacy Patient Advocate Encounter  Received notification from [employer] that Prior Authorization for jardiance  10mg  has been APPROVED from 08/15/23 to 08/14/2026   PA #/Case ID/Reference #: [id number]    Sent mychart

## 2023-08-15 NOTE — Progress Notes (Signed)
 Cardiology Office Note   Date:  08/15/2023  ID:  Ariana Riggs Oct 28, 1960, MRN [id number] PCP: Steva Clotilda DEL, NP  [facility] Providers Cardiologist:  [name], MD  History of Present Illness Ariana Riggs is a 63 y.o. female with a history of morbid obesity, COPD with ongoing tobacco use, A-fib status post ablation, CAD status post DES to left circumflex in 2021, HFimpEF who presents for 3 month follow-up.   Patient developed A-fib in 2020 and developed tachycardia mediated cardiomyopathy with an EF of 30 to 35%, moderate to severe MR, severe TR.  The patient converted to sinus rhythm with amiodarone .  Follow-up echocardiogram showed EF of 50 to 60%.  Patient was diagnosed with hypothyroidism in 2022 and amiodarone  was discontinued and he was started on methimazole  while in January 2023.  She had recurrent A-fib in March 2023 in the setting of admission for abdominal pain and diarrhea.  TSH remains suppressed at that time.  Echo showed EF of 35 to 40% with global hypokinesis.  She was rate controlled with rhythm management with being deferred in the setting of thyroid  storm.  Follow-up echo 02/20/2021 showed an improvement in LVSF with an EF of 50 to 55%, moderate to severe MR.  She underwent cardioversion in November 2023 with subsequent discontinuation of digoxin .  She had recurrent A-fib in 03/2022 and underwent repeat cardioversion 04/2022.  Underwent A-fib ablation 08/20/2022.  10/20/2019 for chest pain and EKG changes.  Left heart cath showed severe left circumflex disease which was treated with PCI/DES.  She had recurrent angina in October 2022 with left heart cath showed patent left circumflex stent with 55% proximal left circumflex stenosis with a normal IFR of 1.  She also had mild proximal RCA disease.  EF was 45 to 50% with global hypokinesis.  She was medically managed.  In October 2022 she had an episode of presyncope and underwent outpatient cardiac monitoring  that showed no significant arrhythmia.  Beta-blocker was previously reduced secondary to fatigue.  She most recently underwent Lexiscan  Myoview  in 07/2022 that showed a small region of ischemia in the inferior apical region with an EF of 53%.  Overall this was a low risk scan.  After review evaluated by EP she underwent right and left heart cath in February 2025 for shortness of breath and chest pain which showed severe single-vessel CAD with 80% proximal to mid circumflex stenosis with no significant CAD observed in the LAD RCA.  She was treated with PCI/DES to the proximal/mid left circumflex.  Echo 3/25 EF of 55 to 60%,G2DD, mod RV HK,  RVSP 63 mmHg, severe LAE, mod to severe MR   RHC 2/25 RA 9 PA 60/25 (37) PCWP 23 (v to 40) Fick 6.1/3.0  PVR 2.3   Patient was diagnosed with sleep apnea by mouth CPAP.  Patient was referred to advanced heart failure team and last seen 07/19/23.  PFTs were ordered since he was started on Trelegy.  Today, the patient is overall doing well. She feels breathing is better, however she feels the heat makes breathing worse. She denies chest pain, lower leg edema, lightheadedness, dizziness, palpitations. She uses a cPAP machine. She is not on Jardiance  due to needing authorization. She denies leg pain. She has cramps during the night.     Studies Reviewed      2D echo 05/13/2023: 1. Left ventricular ejection fraction, by estimation, is 55 to 60%. Left  ventricular ejection fraction by 3D volume is 45 %.  The left ventricle has  normal function. The left ventricle has no regional wall motion  abnormalities. Left ventricular diastolic   parameters are consistent with Grade II diastolic dysfunction  (pseudonormalization). The average left ventricular global longitudinal  strain is -15.4 %. The global longitudinal strain is abnormal.   2. Right ventricular systolic function is moderately reduced. The right  ventricular size is moderately enlarged. There is severely  elevated  pulmonary artery systolic pressure. The estimated right ventricular  systolic pressure is 62.9 mmHg.   3. Left atrial size was severely dilated.   4. Right atrial size was mildly dilated.   5. The mitral valve is normal in structure. Moderate to severe mitral  valve regurgitation.   6. Tricuspid valve regurgitation is mild to moderate.   7. The aortic valve is normal in structure. Aortic valve regurgitation is  not visualized.   8. The inferior vena cava is dilated in size with <50% respiratory  variability, suggesting right atrial pressure of 15 mmHg.  __________   [facility] 04/15/2023:   Conclusions: Severe single-vessel coronary artery disease with 80% proximal/mid LCx lesion (MLA 2.94 mm^2).  No significant CAD observed in the LAD or RCA. Mild elevated left heart filling pressures with prominent v-waves noted on PCWP tracing suggestive of significant mitral regurgitation. Moderately elevated right heart and pulmonary artery pressures. Normal Fick CO/CI. Successful OCT-guided PCI to proximal/mid LCx using Onyx Frontier 3.5 x 18 mm DES.   Recommendations: Overnight observation. Resume apixaban  tomorrow morning if no evidence of bleeding.  Anticipate aspirin , clopidogrel , and apixaban  x 1 week, followed by apixaban  and clopidogrel  for at least 6 months. Continue diuresis and aggressive secondary prevention of coronary artery disease. Obtain echocardiogram to reevaluate LVEF and better assess mitral regurgitation.     Coronary CTA/morphology 08/13/2022: IMPRESSION: 1. There is normal pulmonary vein drainage into the left atrium. 2. The left atrial appendage is a Windsock-cactus type with two lobes and ostial size 24 x 17 mm and length 30 mm, Area 32 mm2. There is no thrombus in the left atrial appendage 3. The esophagus runs in the left atrial midline and is not in the proximity to any of the pulmonary veins. 4. Coronary calcium  score 1270. This is 99th percentile for age  and gender matched controls. __________   Lexiscan  MPI 07/22/2022: Pharmacological myocardial perfusion imaging study with small region of ischemia in the inferoapical region Normal wall motion, EF estimated at 53% No EKG changes concerning for ischemia at peak stress or in recovery. CT attenuation correction images with mild aortic atherosclerosis, coronary calcification of the left circumflex and RCA Low risk scan __________   Limited echo 07/23/2021:   1. Left ventricular ejection fraction, by estimation, is 50 to 55%. The  left ventricle has low normal function. The left ventricle has no regional  wall motion abnormalities. The left ventricular internal cavity size was  mildly dilated. Left ventricular  diastolic parameters are indeterminate.   2. Right ventricular systolic function is normal. The right ventricular  size is normal. There is mildly elevated pulmonary artery systolic  pressure. The estimated right ventricular systolic pressure is 36.6 mmHg.   3. Left atrial size was moderately dilated.   4. The mitral valve is normal in structure. Moderate to severe mitral  valve regurgitation. No evidence of mitral stenosis.   5. Tricuspid valve regurgitation is moderate.   6. The aortic valve is normal in structure. Aortic valve regurgitation is  not visualized. No aortic stenosis is present.  7. The inferior vena cava is normal in size with greater than 50%  respiratory variability, suggesting right atrial pressure of 3 mmHg.   8. Atrial fibrillation noted  __________   2D echo 04/20/2021: 1. Left ventricular ejection fraction, by estimation, is 35 to 40%. The  left ventricle has moderately decreased function. The left ventricle  demonstrates global hypokinesis. The left ventricular internal cavity size  was mildly dilated. Left ventricular  diastolic parameters are indeterminate.   2. Right ventricular systolic function is normal. The right ventricular  size is mildly  enlarged. There is normal pulmonary artery systolic  pressure.   3. Left atrial size was severely dilated.   4. Right atrial size was moderately dilated.   5. The mitral valve is normal in structure. Moderate mitral valve  regurgitation. No evidence of mitral stenosis.   6. Tricuspid valve regurgitation is moderate to severe.   7. The aortic valve is normal in structure. Aortic valve regurgitation is  not visualized. Aortic valve sclerosis/calcification is present, without  any evidence of aortic stenosis.  __________   Zio patch 11/2020: The patient was monitored for 7 days, 4 hours. The predominant rhythm was sinus with an average rate of 69 bpm (range 49-107 bpm in sinus). There were rare PAC's and PVC's. 18 atrial runs were observed, lasting up to 14 beats with a maximum rate of 148 bpm. No sustained arrhythmia or prolonged pause occurred. Single patient triggered event corresponds to sinus rhythm.   Predominantly sinus rhythm with rare PAC's and PVC's, as well as several brief episodes of PSVT. __________   [facility] 11/28/2020: Conclusion: Nonobstructive coronary artery disease with 50-60% proximal LCx stenosis that is not hemodynamically significant (iFR 1.0) and mild plaquing in the proximal RCA. Widely patent mid/distal LCx stent. Mildly reduced left ventricular systolic function (EF 45-50%) with global hypokinesis and mildly elevated filling pressure (LVEDP 15-20 mmHg).   Recommendations: Continue secondary prevention of coronary artery disease. If no evidence of bleeding or vascular injury at right radial arteriotomy site, anticipate restarting apixaban  5 mg twice daily tomorrow morning.  Defer aspirin  in the setting of long-term apixaban  use. Escalate goal-directed medical therapy for treatment of nonischemic cardiomyopathy, as tolerated. Consider placement of ambulatory cardiac monitor at follow-up visit to assess for paroxysmal atrial fibrillation leading to intermittent chest  pain. __________   [facility] 06/18/2019: The left ventricular systolic function is normal. LV end diastolic pressure is normal. The left ventricular ejection fraction is 55-65% by visual estimate. Prox Cx lesion is 30% stenosed. Mid Cx to Dist Cx lesion is 90% stenosed. Post intervention, there is a 0% residual stenosis. A drug-eluting stent was successfully placed using a STENT RESOLUTE ONYX [id number].   1.  Severe one-vessel coronary artery disease involving mid to distal left circumflex. 2.  Normal LV systolic function and normal left ventricular end-diastolic pressure at 10 mmHg 3.  Successful angioplasty and drug-eluting stent placement to the left circumflex.   Recommendations: Eliquis  can be resumed tomorrow morning if no bleeding issues. Given that the patient is on long-term anticoagulation, recommend treatment with clopidogrel  for 6 months without aspirin .  Aspirin  can be discontinued before hospital discharge. I decreased the dose of furosemide  to 40 mg twice daily considering normal LVEDP today. Likely discharge home tomorrow. __________   Limited echo 06/16/2019: 1. Left ventricular ejection fraction, by estimation, is 55 to 60%. Left  ventricular ejection fraction by PLAX is 45 %. The left ventricle has  normal function. The left ventricle has no regional  wall motion  abnormalities. The left ventricular internal  cavity size was mildly dilated. There is mild left ventricular  hypertrophy.   2. Left atrial size was mildly dilated.   3. The inferior vena cava is normal in size with greater than 50%  respiratory variability, suggesting right atrial pressure of 3 mmHg.  __________   2D echo 06/20/2018: 1. The left ventricle has normal systolic function, with an ejection  fraction of 55-60%. The cavity size was mildly dilated. Left ventricular  diastolic Doppler parameters are consistent with pseudonormalization.  Elevated mean left atrial pressure.   2. The right ventricle has  normal systolic function. The cavity was  moderately enlarged. There is no increase in right ventricular wall  thickness. Right ventricular systolic pressure is mildly elevated with an  estimated pressure of 36.5 mmHg.   3. Left atrial size was moderately dilated.   4. Right atrial size was mildly dilated.   5. The mitral valve was not well visualized. Mild thickening of the  mitral valve leaflet. Mitral valve regurgitation is moderate by color flow  Doppler. No evidence of mitral valve stenosis.   6. The tricuspid valve is not well visualized. Tricuspid valve  regurgitation is moderate.   7. The aortic valve was not well visualized.   8. The aortic root and ascending aorta are normal in size.   9. The interatrial septum was not well visualized.  __________   Lexiscan  MPI 04/28/2018: Pharmacological myocardial perfusion imaging study with no significant  Ischemia Small fixed defect of mild intensity in the inferoapical region consistent with attenuation artifact Normal wall motion, EF estimated at 55% No EKG changes concerning for ischemia at peak stress or in recovery. Mild aortic arch calcification noted on attenuation corrected CT scan images Low risk scan __________   2D echo 03/28/2018: 1. The left ventricle has moderate-severely reduced systolic function,  with an ejection fraction of 30-35%. The cavity size was normal. Left  ventricular diastolic Doppler parameters are indeterminate.   2. The right ventricle has moderately reduced systolic function. The  cavity was moderately enlarged. There is no increase in right ventricular  wall thickness.   3. Left atrial size was severely dilated.   4. Right atrial size was moderately dilated.   5. Mitral valve regurgitation is moderate to severe by color flow  Doppler.   6. Tricuspid valve regurgitation is severe.   7. The inferior vena cava was dilated in size with <50% respiratory  variability.   8. Rhythm is normal sinus        Physical Exam VS:  BP 130/60 (BP Location: Right Arm, Patient Position: Sitting, Cuff Size: Normal)   Ht 5' 6 (1.676 m)   Wt 198 lb 9.6 oz (90.1 kg)   LMP  (LMP Unknown)   SpO2 96%   BMI 32.05 kg/m        Wt Readings from Last 3 Encounters:  08/15/23 198 lb 9.6 oz (90.1 kg)  07/19/23 200 lb (90.7 kg)  06/23/23 201 lb (91.2 kg)    GEN: Well nourished, well developed in no acute distress NECK: No JVD; No carotid bruits CARDIAC: RRR, + murmur, no rubs, gallops RESPIRATORY:  Clear to auscultation without rales, wheezing or rhonchi  ABDOMEN: Soft, non-tender, non-distended EXTREMITIES:  No edema; No deformity   ASSESSMENT AND PLAN  HFimpEF PAH - Echo 3/25 showed LVEF 55-60%, G2DD, mod RV HK, RVSP , severe LAE, mod to sever MR - patient has chronic SOB with is multifactorial given  CHF, PAH, smoking history/COPD, OSA, deconditioning, mitral valve disease - patient reports improved breathing since starting Trelegy. She uses her CPAP - continue lasix  80mg  BID - continue spiro 50mg  daily - she has not had Jardiance , we will check on this script - recent BMET stable kidney function and K - the patient appears euvolemic on exam - given severe MR, plan for repeat echo in 2-3 months per Dr. Cherrie  Severe MR - echo 03/2023 showed mod to severe MR - plan for repeat echo in 2-3 months,  if still severe plan for TEE and possible MitraClip  OSA - she reports compliance with CPAP  Afib s/p ablation - continue Eliquis  and Toprol   CAD s/p DES to Lcx x2 - no chert pain reported - continue Lipitor and Toprol  - no ASA given Eliquis   HLD - LDL 40 - continue Lipitor 10mg  daily.   Morbid obesity - continue Monjouro - weight 200>198lbs       Dispo: follow-up with CHF team in 10/2023 and general cards in 01/2024  Signed, Demian Maisel VEAR Fishman, PA-C

## 2023-08-15 NOTE — Patient Instructions (Addendum)
 Medication Instructions:  Your physician recommends that you continue on your current medications as directed. Please refer to the Current Medication list given to you today.    *If you need a refill on your cardiac medications before your next appointment, please call your pharmacy*  Lab Work: No labs ordered today    Testing/Procedures: No labs ordered today   Follow-Up: At [facility], you and your health needs are our priority.  As part of our continuing mission to provide you with exceptional heart care, our providers are all part of one team. his team includes your primary Cardiologist (physician) and Advanced Practice Providers or APPs (Physician Assistants and Nurse Practitioners) who all work together to provide you with the care you need, when you need it.  Your next appointment:   6 month(s)  Provider:   You may see [name], MD or one of the following Advanced Practice Providers on your designated Care Team:   Cadence Franchester, [place]   Your physician recommends that you schedule a follow-up appointment with Dr. Bensimhon

## 2023-08-18 ENCOUNTER — Ambulatory Visit: Admitting: Physician Assistant

## 2023-08-25 ENCOUNTER — Ambulatory Visit: Admitting: Internal Medicine

## 2023-08-25 ENCOUNTER — Other Ambulatory Visit: Payer: Self-pay | Admitting: Gastroenterology

## 2023-08-25 DIAGNOSIS — J449 Chronic obstructive pulmonary disease, unspecified: Secondary | ICD-10-CM | POA: Diagnosis not present

## 2023-08-25 DIAGNOSIS — K7460 Unspecified cirrhosis of liver: Secondary | ICD-10-CM

## 2023-08-25 DIAGNOSIS — R160 Hepatomegaly, not elsewhere classified: Secondary | ICD-10-CM

## 2023-08-25 LAB — PULMONARY FUNCTION TEST
DL/VA % pred: 73 %
DL/VA: 3.02 ml/min/mmHg/L
DLCO unc % pred: 67 %
DLCO unc: 14.29 ml/min/mmHg
FEF 25-75 Post: 1.54 L/s
FEF 25-75 Pre: 1.31 L/s
FEF2575-%Change-Post: 17 %
FEF2575-%Pred-Post: 67 %
FEF2575-%Pred-Pre: 57 %
FEV1-%Change-Post: 3 %
FEV1-%Pred-Post: 84 %
FEV1-%Pred-Pre: 81 %
FEV1-Post: 2.23 L
FEV1-Pre: 2.15 L
FEV1FVC-%Change-Post: 2 %
FEV1FVC-%Pred-Pre: 88 %
FEV6-%Change-Post: 1 %
FEV6-%Pred-Post: 94 %
FEV6-%Pred-Pre: 92 %
FEV6-Post: 3.13 L
FEV6-Pre: 3.07 L
FEV6FVC-%Change-Post: 0 %
FEV6FVC-%Pred-Post: 102 %
FEV6FVC-%Pred-Pre: 101 %
FVC-%Change-Post: 1 %
FVC-%Pred-Post: 92 %
FVC-%Pred-Pre: 91 %
FVC-Post: 3.18 L
FVC-Pre: 3.14 L
Post FEV1/FVC ratio: 70 %
Post FEV6/FVC ratio: 98 %
Pre FEV1/FVC ratio: 68 %
Pre FEV6/FVC Ratio: 98 %
RV % pred: 104 %
RV: 2.26 L
TLC % pred: 100 %
TLC: 5.39 L

## 2023-08-25 NOTE — Progress Notes (Signed)
 Full PFT completed today ? ?

## 2023-08-25 NOTE — Patient Instructions (Signed)
 Full PFT completed today ? ?

## 2023-08-30 ENCOUNTER — Other Ambulatory Visit: Payer: Self-pay | Admitting: Internal Medicine

## 2023-09-05 ENCOUNTER — Ambulatory Visit: Payer: 59 | Admitting: Nurse Practitioner

## 2023-09-05 ENCOUNTER — Other Ambulatory Visit: Payer: 59

## 2023-09-06 ENCOUNTER — Telehealth: Payer: Self-pay | Admitting: Pharmacist

## 2023-09-06 ENCOUNTER — Other Ambulatory Visit: Payer: Self-pay

## 2023-09-06 MED ORDER — MOUNJARO 12.5 MG/0.5ML ~~LOC~~ SOAJ
12.5000 mg | SUBCUTANEOUS | 0 refills | Status: DC
Start: 2023-09-06 — End: 2023-12-27
  Filled 2023-09-06: qty 2, 28d supply, fill #0

## 2023-09-06 NOTE — Telephone Encounter (Signed)
 Tolerating Mounajro well, increase to 12.5 mg weekly.

## 2023-09-08 ENCOUNTER — Ambulatory Visit
Admission: RE | Admit: 2023-09-08 | Discharge: 2023-09-08 | Disposition: A | Discharge status: Home / Self Care | Source: Ambulatory Visit | Attending: Gastroenterology | Admitting: Gastroenterology

## 2023-09-08 ENCOUNTER — Encounter: Payer: Self-pay | Admitting: Nurse Practitioner

## 2023-09-08 ENCOUNTER — Inpatient Hospital Stay (HOSPITAL_BASED_OUTPATIENT_CLINIC_OR_DEPARTMENT_OTHER): Payer: Self-pay | Admitting: Nurse Practitioner

## 2023-09-08 ENCOUNTER — Inpatient Hospital Stay: Payer: Self-pay | Attending: Oncology

## 2023-09-08 VITALS — BP 107/95 | HR 80 | Temp 98.8°F | Resp 18 | Wt 199.0 lb

## 2023-09-08 DIAGNOSIS — Z1732 Human epidermal growth factor receptor 2 negative status: Secondary | ICD-10-CM | POA: Diagnosis not present

## 2023-09-08 DIAGNOSIS — C50912 Malignant neoplasm of unspecified site of left female breast: Secondary | ICD-10-CM | POA: Diagnosis present

## 2023-09-08 DIAGNOSIS — Z1721 Progesterone receptor positive status: Secondary | ICD-10-CM | POA: Diagnosis not present

## 2023-09-08 DIAGNOSIS — K7460 Unspecified cirrhosis of liver: Secondary | ICD-10-CM | POA: Insufficient documentation

## 2023-09-08 DIAGNOSIS — F17210 Nicotine dependence, cigarettes, uncomplicated: Secondary | ICD-10-CM | POA: Insufficient documentation

## 2023-09-08 DIAGNOSIS — Z170 Estrogen receptor positive status [ER+]: Secondary | ICD-10-CM | POA: Diagnosis not present

## 2023-09-08 DIAGNOSIS — Z853 Personal history of malignant neoplasm of breast: Secondary | ICD-10-CM | POA: Diagnosis not present

## 2023-09-08 DIAGNOSIS — Z923 Personal history of irradiation: Secondary | ICD-10-CM | POA: Diagnosis not present

## 2023-09-08 DIAGNOSIS — Z79899 Other long term (current) drug therapy: Secondary | ICD-10-CM | POA: Insufficient documentation

## 2023-09-08 DIAGNOSIS — Z79811 Long term (current) use of aromatase inhibitors: Secondary | ICD-10-CM | POA: Diagnosis not present

## 2023-09-08 DIAGNOSIS — Z5181 Encounter for therapeutic drug level monitoring: Secondary | ICD-10-CM | POA: Diagnosis not present

## 2023-09-08 DIAGNOSIS — M85852 Other specified disorders of bone density and structure, left thigh: Secondary | ICD-10-CM

## 2023-09-08 DIAGNOSIS — Z08 Encounter for follow-up examination after completed treatment for malignant neoplasm: Secondary | ICD-10-CM

## 2023-09-08 DIAGNOSIS — R160 Hepatomegaly, not elsewhere classified: Secondary | ICD-10-CM | POA: Insufficient documentation

## 2023-09-08 LAB — CBC WITH DIFFERENTIAL (CANCER CENTER ONLY)
Abs Immature Granulocytes: 0.02 K/uL (ref 0.00–0.07)
Basophils Absolute: 0 K/uL (ref 0.0–0.1)
Basophils Relative: 0 %
Eosinophils Absolute: 0.1 K/uL (ref 0.0–0.5)
Eosinophils Relative: 1 %
HCT: 39.1 % (ref 36.0–46.0)
Hemoglobin: 14.3 g/dL (ref 12.0–15.0)
Immature Granulocytes: 0 %
Lymphocytes Relative: 21 %
Lymphs Abs: 1.1 K/uL (ref 0.7–4.0)
MCH: 35.9 pg — ABNORMAL HIGH (ref 26.0–34.0)
MCHC: 36.6 g/dL — ABNORMAL HIGH (ref 30.0–36.0)
MCV: 98.2 fL (ref 80.0–100.0)
Monocytes Absolute: 0.6 K/uL (ref 0.1–1.0)
Monocytes Relative: 12 %
Neutro Abs: 3.2 K/uL (ref 1.7–7.7)
Neutrophils Relative %: 66 %
Platelet Count: 137 K/uL — ABNORMAL LOW (ref 150–400)
RBC: 3.98 MIL/uL (ref 3.87–5.11)
RDW: 15.3 % (ref 11.5–15.5)
WBC Count: 5 K/uL (ref 4.0–10.5)
nRBC: 0 % (ref 0.0–0.2)

## 2023-09-08 LAB — CMP (CANCER CENTER ONLY)
ALT: 19 U/L (ref 0–44)
AST: 25 U/L (ref 15–41)
Albumin: 4.7 g/dL (ref 3.5–5.0)
Alkaline Phosphatase: 142 U/L — ABNORMAL HIGH (ref 38–126)
Anion gap: 11 (ref 5–15)
BUN: 14 mg/dL (ref 8–23)
CO2: 25 mmol/L (ref 22–32)
Calcium: 9.2 mg/dL (ref 8.9–10.3)
Chloride: 96 mmol/L — ABNORMAL LOW (ref 98–111)
Creatinine: 0.76 mg/dL (ref 0.44–1.00)
GFR, Estimated: >60 mL/min (ref >60)
Glucose, Bld: 93 mg/dL (ref 70–99)
Potassium: 3.8 mmol/L (ref 3.5–5.1)
Sodium: 132 mmol/L — ABNORMAL LOW (ref 135–145)
Total Bilirubin: 1.3 mg/dL — ABNORMAL HIGH (ref 0.0–1.2)
Total Protein: 7.8 g/dL (ref 6.5–8.1)

## 2023-09-08 MED ORDER — IOHEXOL 300 MG/ML  SOLN
100.0000 mL | Freq: Once | INTRAMUSCULAR | Status: AC | PRN
  Administered 2023-09-08: 100 mL via INTRAVENOUS
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
## 2023-09-20 ENCOUNTER — Other Ambulatory Visit: Payer: Self-pay | Admitting: *Deleted

## 2023-09-20 ENCOUNTER — Telehealth: Payer: Self-pay | Admitting: *Deleted

## 2023-09-20 DIAGNOSIS — Z122 Encounter for screening for malignant neoplasm of respiratory organs: Secondary | ICD-10-CM

## 2023-09-20 DIAGNOSIS — F17210 Nicotine dependence, cigarettes, uncomplicated: Secondary | ICD-10-CM

## 2023-09-20 DIAGNOSIS — Z87891 Personal history of nicotine dependence: Secondary | ICD-10-CM

## 2023-09-20 NOTE — Telephone Encounter (Signed)
 Lung Cancer Screening Narrative/Criteria Questionnaire (Cigarette Smokers Only- No Cigars/Pipes/vapes)   Ariana Riggs   SDMV:09/26/23 11:00- Natalie                                           [birth year]              LDCT: 09/29/23 10:00- DRI A    63 y.o.   Phone: [phone]  Lung Screening Narrative (confirm age 40-77 yrs Medicare / 50-80 yrs Private pay insurance)   Insurance information:[employer]   Referring Provider:Kasa   This screening involves an initial phone call with a team member from our program. It is called a shared decision making visit. The initial meeting is required by insurance and Medicare to make sure you understand the program. This appointment takes about 15-20 minutes to complete. The CT scan will completed at a separate date/time. This scan takes about 5-10 minutes to complete and you may eat and drink before and after the scan.  Criteria questions for Lung Cancer Screening:   Are you a current or former smoker? Current Age began smoking: 59   If you are a former smoker, what year did you quit smoking? (within 15 yrs)   To calculate your smoking history, I need an accurate estimate of how many packs of cigarettes you smoked per day and for how many years. (Not just the number of PPD you are now smoking)   Years smoking 51 x Packs per day 1/4 - 1 = Pack years 30   (at least 20 pack yrs)   (Make sure they understand that we need to know how much they have smoked in the past, not just the number of PPD they are smoking now)  Do you have a personal history of cancer?  Yes - (type and when diagnosed - 5 yrs cancer free) Breast 2023- Surgery & rad tx    Do you have a family history of cancer? Yes  (cancer type and and relative) Father(prostate)  Are you coughing up blood?  Yes- ( some blood tinged sputum- also has gastric varicies)  Have you had unexplained weight loss of 15 lbs or more in the last 6 months? No  It looks like you meet all criteria.      Additional information: N/A

## 2023-09-21 ENCOUNTER — Encounter: Payer: Self-pay | Admitting: Internal Medicine

## 2023-09-26 ENCOUNTER — Ambulatory Visit (INDEPENDENT_AMBULATORY_CARE_PROVIDER_SITE_OTHER): Admitting: *Deleted

## 2023-09-26 ENCOUNTER — Encounter: Payer: Self-pay | Admitting: *Deleted

## 2023-09-26 DIAGNOSIS — E871 Hypo-osmolality and hyponatremia: Secondary | ICD-10-CM | POA: Diagnosis not present

## 2023-09-26 DIAGNOSIS — F1010 Alcohol abuse, uncomplicated: Secondary | ICD-10-CM | POA: Diagnosis not present

## 2023-09-26 DIAGNOSIS — Z794 Long term (current) use of insulin: Secondary | ICD-10-CM | POA: Diagnosis not present

## 2023-09-26 DIAGNOSIS — N183 Chronic kidney disease, stage 3 unspecified: Secondary | ICD-10-CM | POA: Diagnosis not present

## 2023-09-26 DIAGNOSIS — I4891 Unspecified atrial fibrillation: Secondary | ICD-10-CM | POA: Diagnosis not present

## 2023-09-26 DIAGNOSIS — I5020 Unspecified systolic (congestive) heart failure: Secondary | ICD-10-CM | POA: Diagnosis not present

## 2023-09-26 DIAGNOSIS — E785 Hyperlipidemia, unspecified: Secondary | ICD-10-CM | POA: Diagnosis not present

## 2023-09-26 DIAGNOSIS — F17210 Nicotine dependence, cigarettes, uncomplicated: Secondary | ICD-10-CM | POA: Diagnosis not present

## 2023-09-26 DIAGNOSIS — E0590 Thyrotoxicosis, unspecified without thyrotoxic crisis or storm: Secondary | ICD-10-CM | POA: Diagnosis not present

## 2023-09-26 DIAGNOSIS — M1A00X0 Idiopathic chronic gout, unspecified site, without tophus (tophi): Secondary | ICD-10-CM | POA: Diagnosis not present

## 2023-09-26 DIAGNOSIS — K219 Gastro-esophageal reflux disease without esophagitis: Secondary | ICD-10-CM | POA: Diagnosis not present

## 2023-09-26 DIAGNOSIS — E1169 Type 2 diabetes mellitus with other specified complication: Secondary | ICD-10-CM | POA: Diagnosis not present

## 2023-09-26 NOTE — Patient Instructions (Signed)

## 2023-09-26 NOTE — Progress Notes (Signed)
  Virtual Visit via Telephone Note  I connected with Ariana Riggs on 09/26/23 at 11:00 AM EDT by telephone and verified that I am speaking with the correct person using two identifiers.  Location: Patient: [name] Provider: Laneta Speaks, RN   I discussed the limitations, risks, security and privacy concerns of performing an evaluation and management service by telephone and the availability of in person appointments. I also discussed with the patient that there may be a patient responsible charge related to this service. The patient expressed understanding and agreed to proceed.   Shared Decision Making Visit Lung Cancer Screening Program [phone])   Eligibility: Age 63 y.o. Pack Years Smoking History Calculation 30 (# packs/per year x # years smoked) Recent History of coughing up blood  no Unexplained weight loss? no ( >Than 15 pounds within the last 6 months ) Prior History Lung / other cancer no (Diagnosis within the last 5 years already requiring surveillance chest CT Scans). Smoking Status Current Smoker Former Smokers: Years since quit: n/a  Quit Date: n/a  Visit Components: Discussion included one or more decision making aids. yes Discussion included risk/benefits of screening. yes Discussion included potential follow up diagnostic testing for abnormal scans. yes Discussion included meaning and risk of over diagnosis. yes Discussion included meaning and risk of False Positives. yes Discussion included meaning of total radiation exposure. yes  Counseling Included: Importance of adherence to annual lung cancer LDCT screening. yes Impact of comorbidities on ability to participate in the program. yes Ability and willingness to under diagnostic treatment. yes  Smoking Cessation Counseling: Current Smokers:  Discussed importance of smoking cessation. yes Information about tobacco cessation classes and interventions provided to patient. yes Patient provided  with ticket for LDCT Scan. no Diagnosis Code: Tobacco Use Z72.0 Asymptomatic Patient yes  Counseling: intermediate counseling 3 1/2 minutes on tobacco cessation Former Smokers:  Discussed the importance of maintaining cigarette abstinence. yes Diagnosis Code: Personal History of Nicotine  Dependence. S12.108 Information about tobacco cessation classes and interventions provided to patient. Yes Patient provided with ticket for LDCT Scan. no Written Order for Lung Cancer Screening with LDCT placed in Epic. Yes (CT Chest Lung Cancer Screening Low Dose W/O CM) [id number] Z12.2-Screening of respiratory organs Z87.891-Personal history of nicotine  dependence   Laneta Speaks, RN

## 2023-09-29 ENCOUNTER — Ambulatory Visit
Admission: RE | Admit: 2023-09-29 | Discharge: 2023-09-29 | Disposition: A | Discharge status: Home / Self Care | Source: Ambulatory Visit | Attending: Acute Care | Admitting: Acute Care

## 2023-09-29 DIAGNOSIS — F17210 Nicotine dependence, cigarettes, uncomplicated: Secondary | ICD-10-CM | POA: Diagnosis not present

## 2023-09-29 DIAGNOSIS — Z87891 Personal history of nicotine dependence: Secondary | ICD-10-CM

## 2023-09-29 DIAGNOSIS — Z122 Encounter for screening for malignant neoplasm of respiratory organs: Secondary | ICD-10-CM

## 2023-09-30 ENCOUNTER — Telehealth: Payer: Self-pay

## 2023-09-30 DIAGNOSIS — R918 Other nonspecific abnormal finding of lung field: Secondary | ICD-10-CM

## 2023-09-30 NOTE — Addendum Note (Signed)
 Addended by: RHETT KELLY POUR on: 09/30/2023 02:49 PM   Modules accepted: Orders

## 2023-09-30 NOTE — Telephone Encounter (Signed)
 Call report from [facility]  IMPRESSION: 1. Multiple bilateral lower lobe pulmonary nodules, measuring up to 20.8 mm in the posteromedial right lower lobe, with suspected right hilar and ipsilateral mediastinal adenopathy. 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 Lung-RADS 4X, highly suspicious. Additional imaging evaluation or consultation with Pulmonology or Thoracic Surgery recommended. These results will be called to the ordering clinician or representative by the Radiologist Assistant, and communication documented in the PACS or [employer]. 2. Aortic atherosclerosis (ICD10-I70.0). Coronary artery calcification. 3. Enlarged pulmonic trunk, indicative of pulmonary arterial hypertension. 4.  Emphysema (ICD10-J43.9).

## 2023-09-30 NOTE — Telephone Encounter (Signed)
 Results were sent to Dr. Tamea for review. She recommended scheduling a PET and remind patient to follow up with Dr. Isaiah 10/06/2023 as planned. I have called and spoken with the patient. Reviewed results. She is in agreement to complete a PET scan to evaluate the large nodules. PET order has been placed. Results and plan to PCP. Pt had no additional questions. NFN.

## 2023-10-05 ENCOUNTER — Ambulatory Visit
Admission: RE | Admit: 2023-10-05 | Discharge: 2023-10-05 | Disposition: A | Discharge status: Home / Self Care | Source: Ambulatory Visit | Attending: Pulmonary Disease | Admitting: Pulmonary Disease

## 2023-10-05 ENCOUNTER — Ambulatory Visit: Payer: Self-pay | Admitting: Pulmonary Disease

## 2023-10-05 DIAGNOSIS — I2510 Atherosclerotic heart disease of native coronary artery without angina pectoris: Secondary | ICD-10-CM | POA: Insufficient documentation

## 2023-10-05 DIAGNOSIS — J439 Emphysema, unspecified: Secondary | ICD-10-CM | POA: Diagnosis not present

## 2023-10-05 DIAGNOSIS — N200 Calculus of kidney: Secondary | ICD-10-CM | POA: Insufficient documentation

## 2023-10-05 DIAGNOSIS — R590 Localized enlarged lymph nodes: Secondary | ICD-10-CM | POA: Insufficient documentation

## 2023-10-05 DIAGNOSIS — K7460 Unspecified cirrhosis of liver: Secondary | ICD-10-CM | POA: Insufficient documentation

## 2023-10-05 DIAGNOSIS — I700 Atherosclerosis of aorta: Secondary | ICD-10-CM | POA: Insufficient documentation

## 2023-10-05 DIAGNOSIS — C7951 Secondary malignant neoplasm of bone: Secondary | ICD-10-CM | POA: Diagnosis not present

## 2023-10-05 DIAGNOSIS — R918 Other nonspecific abnormal finding of lung field: Secondary | ICD-10-CM | POA: Diagnosis not present

## 2023-10-05 LAB — GLUCOSE, CAPILLARY: Glucose-Capillary: 129 mg/dL — ABNORMAL HIGH (ref 70–99)

## 2023-10-05 MED ORDER — FLUDEOXYGLUCOSE F - 18 (FDG) INJECTION
10.3000 | Freq: Once | INTRAVENOUS | Status: AC | PRN
  Administered 2023-10-05: 10.92 via INTRAVENOUS

## 2023-10-06 ENCOUNTER — Ambulatory Visit: Admitting: Internal Medicine

## 2023-10-06 ENCOUNTER — Encounter: Payer: Self-pay | Admitting: Internal Medicine

## 2023-10-06 VITALS — BP 112/60 | HR 85 | Temp 98.8°F | Ht 66.0 in | Wt 195.0 lb

## 2023-10-06 DIAGNOSIS — J449 Chronic obstructive pulmonary disease, unspecified: Secondary | ICD-10-CM

## 2023-10-06 DIAGNOSIS — F17210 Nicotine dependence, cigarettes, uncomplicated: Secondary | ICD-10-CM

## 2023-10-06 DIAGNOSIS — Z6831 Body mass index (BMI) 31.0-31.9, adult: Secondary | ICD-10-CM | POA: Diagnosis not present

## 2023-10-06 DIAGNOSIS — R160 Hepatomegaly, not elsewhere classified: Secondary | ICD-10-CM

## 2023-10-06 DIAGNOSIS — R918 Other nonspecific abnormal finding of lung field: Secondary | ICD-10-CM

## 2023-10-06 DIAGNOSIS — R9389 Abnormal findings on diagnostic imaging of other specified body structures: Secondary | ICD-10-CM | POA: Diagnosis not present

## 2023-10-06 DIAGNOSIS — Z122 Encounter for screening for malignant neoplasm of respiratory organs: Secondary | ICD-10-CM

## 2023-10-06 DIAGNOSIS — E669 Obesity, unspecified: Secondary | ICD-10-CM | POA: Diagnosis not present

## 2023-10-06 DIAGNOSIS — G4733 Obstructive sleep apnea (adult) (pediatric): Secondary | ICD-10-CM | POA: Diagnosis not present

## 2023-10-06 MED ORDER — NICOTINE 21 MG/24HR TD PT24: 21.0000 mg | MEDICATED_PATCH | TRANSDERMAL | 1 refills | Status: DC

## 2023-10-06 NOTE — Assessment & Plan Note (Signed)
 Smoking Assessment and Cessation Counseling Upon further questioning, Patient smokes 1 ppd I have advised patient to quit/stop smoking as soon as possible due to high risk for multiple medical problems Patient is willing to quit smoking I have advised patient that we can assist and have options of Nicotine  replacement therapy. I also advised patient on behavioral therapy and can provide oral medication therapy in conjunction with the other therapies Follow up next Office visit  for assessment of smoking cessation Smoking cessation counseling advised for 4 minutes

## 2023-10-06 NOTE — Assessment & Plan Note (Signed)
 Assessment of sleep apnea HST reveals AHI of 48 Continue CPAP as prescribed  Excellent compliance report Reviewed compliance report in detail with patient Patient definitely benefits the use of CPAP therapy as prescribed Using CPAP nightly and with naps Pressure setting is comfortable and is sleeping well. CPAP prescription  AHI reduced to  No evidence of acute heart failure at this time No respiratory distress No fevers, chills, nausea, vomiting, diarrhea No evidence hemoptysis  Patient Instructions Continue to use CPAP every night, minimum of 4-6 hours a night.  Change equipment every 30 days or as directed by DME.  Wash your tubing with warm soap and water  daily, hang to dry. Wash humidifier portion weekly. Use bottled, distilled water  and change daily   Be aware of reduced alertness and do not drive or operate heavy machinery if experiencing this or drowsiness.  Exercise encouraged, as tolerated. Encouraged proper weight management.  Important to get eight or more hours of sleep  Limiting the use of the computer and television before bedtime.  Decrease naps during the day, so night time sleep will become enhanced.  Limit caffeine , and sleep deprivation.  HTN, stroke, uncontrolled diabetes and heart failure are potential risk factors.  Risk of untreated sleep apnea including cardiac arrhthymias, stroke, DM, pulm HTN.

## 2023-10-06 NOTE — Patient Instructions (Addendum)
 Plan to contact interventional radiology to assess for biopsy of the bone and the lung  Continue Trelegy as prescribed Please stop smoking Start nicotine  patches  Avoid Allergens and Irritants Avoid secondhand smoke Avoid SICK contacts Recommend  Masking  when appropriate Recommend Keep up-to-date with vaccinations  Excellent Job A+ G[employer] STAR!!  Continue CPAP as prescribed  Patient Instructions Continue to use CPAP every night, minimum of 4-6 hours a night.  Change equipment every 30 days or as directed by DME.  Wash your tubing with warm soap and water  daily, hang to dry. Wash humidifier portion weekly. Use bottled, distilled water  and change daily   Be aware of reduced alertness and do not drive or operate heavy machinery if experiencing this or drowsiness.  Exercise encouraged, as tolerated. Encouraged proper weight management.  Important to get eight or more hours of sleep  Limiting the use of the computer and television before bedtime.  Decrease naps during the day, so night time sleep will become enhanced.  Limit caffeine , and sleep deprivation.    Avoid Allergens and Irritants Avoid secondhand smoke Avoid SICK contacts Recommend  Masking  when appropriate Recommend Keep up-to-date with vaccinations

## 2023-10-06 NOTE — Progress Notes (Unsigned)
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 Borderline enlarged upper abdominal lymph nodes are identified, nonspecific in the setting of cirrhosis. This includes a 1.1 cm peripancreatic node, image 25/8. Aorta-caval lymph node measures 1.1 cm, image 25/8. BONES AND SOFT TISSUES: No acute abnormality of the visualized bones. No focal soft tissue abnormality. IMPRESSION: 1. Cirrhosis with squaring of the caudate lobe and relative hypertrophy of the lateral segment of the left lobe. No suspicious liver lesion identified.  Stigmata of portal venous hypertension including mild upper abdominal varices and splenomegaly. 2. Mild fusiform dilatation of the CBD measuring 8 mm. 3. Borderline enlarged upper abdominal lymph nodes, nonspecific in the setting of cirrhosis. 4. Mild perigastric varices. No ascites. 5. Bilateral, punctate Nonobstructing renal 6. Multiple new subpleural nodules noted within the lung bases, measuring up to 8 mm. Recommend initial workup with dedicated unenhanced CT of the chest to assess for additional lung nodules. Electronically  signed by: Waddell Calk MD 09/16/2023 07:07 AM EDT RP Workstation: [id number]      ASSESSMENT/PLAN   63 year old white female with extensive smoking history with a diagnosis of COPD with progressive shortness of breath and dyspnea on exertion with multifactorial etiologies including CAD CHF with obesity and deconditioned state in the setting of severe sleep apnea  Assessment & Plan Chronic obstructive pulmonary disease, unspecified COPD type (HCC) Continue Trelegy as prescribed Avoid Allergens and Irritants Avoid secondhand smoke Avoid SICK contacts Recommend  Masking  when appropriate Recommend Keep up-to-date with vaccinations No exacerbation at this time No evidence of heart failure at this time No evidence or signs of infection at this time No respiratory distress No fevers, chills, nausea, vomiting, diarrhea No evidence of lower extremity edema No evidence hemoptysis   OSA (obstructive sleep apnea) Assessment of sleep apnea HST reveals AHI of 48 Continue CPAP as prescribed  Excellent compliance report Reviewed compliance report in detail with patient Patient definitely benefits the use of CPAP therapy as prescribed Using CPAP nightly and with naps Pressure setting is comfortable and is sleeping well. AHI reduced   No evidence of acute heart failure at this time No respiratory distress No fevers, chills, nausea, vomiting, diarrhea No evidence hemoptysis  Patient Instructions Continue to use CPAP every night, minimum of 4-6 hours a night.  Change equipment every 30 days or as directed by DME.  Wash your tubing with warm soap and water  daily, hang to dry. Wash humidifier portion weekly. Use bottled, distilled water  and change daily   Be aware of reduced alertness and do not drive or operate heavy machinery if experiencing this or drowsiness.  Exercise encouraged, as tolerated. Encouraged proper weight management.  Important to get eight or more hours of sleep   Limiting the use of the computer and television before bedtime.  Decrease naps during the day, so night time sleep will become enhanced.  Limit caffeine , and sleep deprivation.  HTN, stroke, uncontrolled diabetes and heart failure are potential risk factors.  Risk of untreated sleep apnea including cardiac arrhthymias, stroke, DM, pulm HTN.   Cigarette smoker Smoking Assessment and Cessation Counseling Upon further questioning, Patient smokes 1 ppd I have advised patient to quit/stop smoking as soon as possible due to high risk for multiple medical problems Patient is willing to quit smoking I have advised patient that we can assist and have options of Nicotine  replacement therapy. 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 with SUV max of 7.1 Findings discussed with patient and family Plan for interventional radiology assessment for bone biopsy and pleural-based lung mass biopsy IR physician on-call contacted Lung mass Likely primary lung cancer diagnostic workup pending Liver  mass Plan for ultrasound-guided liver biopsy as per radiology recommendations  Obesity -recommend significant weight loss -recommend changing diet  Deconditioned state -Recommend increased daily activity and exercise   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 All questions answered   Follow up 3 months   I spent a total of 51 minutes dedicated to the care of this patient on the date of this encounter to include pre-visit review of records, face-to-face time with the patient discussing conditions above, post visit ordering of testing, clinical documentation with the electronic health record, making appropriate referrals as documented, and communicating necessary information to the patient's healthcare team.    The Patient requires high complexity decision making for assessment and support, frequent evaluation and titration of therapies, application of advanced monitoring technologies and extensive interpretation of multiple databases.  Patient satisfied with Plan of action and management. All questions answered    Nickolas Alm Cellar, M.D.  [facility]  Medical Director [facility] Medical Director [facility] Cardio-Pulmonary Department

## 2023-10-06 NOTE — Assessment & Plan Note (Signed)
 Continue Trelegy as prescribed Avoid Allergens and Irritants Avoid secondhand smoke Avoid SICK contacts Recommend  Masking  when appropriate Recommend Keep up-to-date with vaccinations No exacerbation at this time No evidence of heart failure at this time No evidence or signs of infection at this time No respiratory distress No fevers, chills, nausea, vomiting, diarrhea No evidence of lower extremity edema No evidence hemoptysis

## 2023-10-07 ENCOUNTER — Telehealth: Payer: Self-pay | Admitting: *Deleted

## 2023-10-07 NOTE — Telephone Encounter (Signed)
-----   Message from Percy Rosaline HERO sent at 10/07/2023 10:33 AM EDT ----- Regarding: FW: ATTN: Dr C End    Med Clearance  ----- Message ----- From: Mady Bruckner, MD Sent: 10/07/2023  10:16 AM EDT To: [facility] Preop Subject: FW: ATTN: Dr C End    Med Clearance             ----- Message ----- From: Carlie Clarita RAMAN Sent: 10/07/2023  10:14 AM EDT To: Bruckner Mady, MD Subject: ATTN: Dr C End    Med Clearance                  The above patient has been referred for a US  guided liver lesion biopsy by Dr Isaiah Scrivener, MD of [facility].   Records indicate that the patient is taking Eliquis  5mg  twice a day that is prescribed and monitored by Dr Bruckner Mady, MD.   Dr Marcey Moan, MD from [facility] will be performing this procedure and is asking to obtain a Medical Clearance which will require the patient to hold the medication for 48 hrs prior to the procedure. We will need medical clearance for the patient to hold medication before the procedure can be scheduled. If there are any questions, please give us  a call at [phone].   Thanks

## 2023-10-07 NOTE — Telephone Encounter (Addendum)
   Pre-operative Risk Assessment    Patient Name: Ariana Riggs  DOB: March 20, 1960 MRN: [id number]   Date of last office visit: 08/15/23 MIKEY FISHMAN, [facility] Date of next office visit: NONE   Request for Surgical Clearance    Procedure:  US  guided liver lesion biopsy   Date of Surgery:  Clearance TBD                                Surgeon:  DR. MARCEY MOAN  Surgeon's Group or Practice Name:  [facility] Phone number:  [phone]; CLARITA RICKER Fax number: [phone]  Type of Clearance Requested:   - Medical  - Pharmacy:  Hold Apixaban  (Eliquis ) x 48 HOURS PRIOR   Type of Anesthesia:  Not Indicated; office confirmed ,MODERATE IV SEDATION-FENTAYL Additional requests/questions:    Bonney Niels Jest   10/07/2023, 10:36 AM

## 2023-10-07 NOTE — Telephone Encounter (Signed)
 Pharmacy please advise on holding Eliquis  for days  prior to  US  guided liver lesion biopsy  scheduled for TBD. Last labs: (BMET) 09/26/2023 and CBC 09/08/2023 Thank you.

## 2023-10-10 ENCOUNTER — Encounter: Payer: Self-pay | Admitting: Internal Medicine

## 2023-10-10 NOTE — Telephone Encounter (Signed)
 Donzell can you check on the IR referral?

## 2023-10-10 NOTE — Telephone Encounter (Signed)
 Cadence, patient's chart was reviewed for preoperative cardiac evaluation.  She was seen by you on 08/15/23 and according to protocol, we request that you comment on cardiac risk for upcoming procedure since office visit was less than 2 months ago.    Please route your response to p cv div preop.  Thank you, Rosaline EMERSON Bane, NP-C 10/10/2023, 10:10 AM

## 2023-10-10 NOTE — Telephone Encounter (Signed)
 Patient with diagnosis of afib on Eliquis  for anticoagulation.    Procedure: US  guided liver lesion biopsy  Date of procedure: TBD   CHA2DS2-VASc Score = 4   This indicates a 4.8% annual risk of stroke. The patient's score is based upon: CHF History: 1 HTN History: 0 Diabetes History: 1 Stroke History: 0 Vascular Disease History: 1 Age Score: 0 Gender Score: 1      CrCl 70 ml/min Platelet count 137  Patient has not had an Afib/aflutter ablation within the last 3 months or DCCV within the last 30 days  Per office protocol, patient can hold Eliquis  for 2 days prior to procedure.    **This guidance is not considered finalized until pre-operative APP has relayed final recommendations.**

## 2023-10-12 NOTE — Telephone Encounter (Signed)
   Patient Name: Ariana Riggs  DOB: Sep 13, 1960 MRN: [id number]  Primary Cardiologist: [name], MD  Chart reviewed as part of pre-operative protocol coverage. Given past medical history and time since last visit, based on ACC/AHA guidelines, [name] is at acceptable risk for the planned procedure without further cardiovascular testing.   Ariana Riggs was contacted this morning and reported that she has been doing well from a cardiac standpoint with no worsening shortness of breath since her visit in June.  Per Dr. Ulysses request patient can hold Eliquis  for 48 hours prior to procedure and he recommends that she start an 81 mg aspirin  daily while off of her Eliquis  as long as severe life-threatening bleeding is not excessively high.  She should resume Eliquis  as soon as safely possible following her procedure.  The patient was advised that if she develops new symptoms prior to surgery to contact our office to arrange for a follow-up visit, and she verbalized understanding.  I will route this recommendation to the requesting party via Epic fax function and remove from pre-op pool.  Please call with questions.  [name], [name], NP 10/12/2023, 9:11 AM

## 2023-10-12 NOTE — Telephone Encounter (Signed)
 Based on most recent office evaluation by Cadence Franchester, [place], in late June, it sounds as though Ariana Riggs has stable NYHA class II-III symptoms.  As long as she has not developed any worsening shortness of breath or new chest pain, I think it is reasonable for her to proceed with ultrasound-guided liver biopsy, which is low risk for periprocedural cardiovascular complications.  I think it is reasonable to hold her apixaban  for 48 hours before the procedure.  However, given PCI in 03/2023, I recommend that aspirin  81 mg daily be initiated while the patient is off apixaban  as long as the risk for severe/life-threatening bleeding is not excessively high.  Apixaban  should be resumed as soon as it is felt safe to do so by interventional radiology.  [name], MD [facility]

## 2023-10-13 DIAGNOSIS — J449 Chronic obstructive pulmonary disease, unspecified: Secondary | ICD-10-CM | POA: Diagnosis not present

## 2023-10-13 DIAGNOSIS — G4733 Obstructive sleep apnea (adult) (pediatric): Secondary | ICD-10-CM | POA: Diagnosis not present

## 2023-10-13 NOTE — Telephone Encounter (Signed)
 Per secure chat from Clarita Ricker- Walterine Small, I have talked with her and she is scheduled for next Friday. If I can move her up or if I have any cancellations, then I will call her and move her. Thanks so much for your help!  Nothing further needed.

## 2023-10-14 DIAGNOSIS — G4733 Obstructive sleep apnea (adult) (pediatric): Secondary | ICD-10-CM | POA: Diagnosis not present

## 2023-10-18 ENCOUNTER — Encounter: Payer: Self-pay | Admitting: Pharmacist

## 2023-10-18 ENCOUNTER — Telehealth: Payer: Self-pay

## 2023-10-18 NOTE — Telephone Encounter (Signed)
 Copied from CRM [id number]. Topic: General - Other >> Oct 14, 2023  2:35 PM Rilla B wrote: Reason for CRM: Daughter calling concern about diagnosis with Dr Isaiah. Would like a call to discuss findings. Please call patient @ [phone].

## 2023-10-19 ENCOUNTER — Other Ambulatory Visit: Payer: Self-pay | Admitting: Diagnostic Radiology

## 2023-10-19 DIAGNOSIS — Z01818 Encounter for other preprocedural examination: Secondary | ICD-10-CM

## 2023-10-20 NOTE — Progress Notes (Signed)
 Patient for US  guided Liver Biopsy on Friday 10/21/23, I called and spoke with the patient on the phone and gave pre-procedure instructions. Pt was made aware to be here at 7:30a, last dose of Eliquis  on Tues 10/18/23, NPO after MN prior to procedure as well as driver post procedure/recovery/discharge. Pt stated understanding.  Called 10/13/23  Dr Jenna stated that patient could remain on ASA 81mg  as preferred by Dr End -   Scanned into media manager

## 2023-10-21 ENCOUNTER — Other Ambulatory Visit: Payer: Self-pay

## 2023-10-21 ENCOUNTER — Ambulatory Visit: Payer: Self-pay | Admitting: Pulmonary Disease

## 2023-10-21 ENCOUNTER — Ambulatory Visit: Payer: Self-pay

## 2023-10-21 ENCOUNTER — Ambulatory Visit
Admission: RE | Admit: 2023-10-21 | Discharge: 2023-10-21 | Disposition: A | Discharge status: Home / Self Care | Source: Ambulatory Visit | Attending: Internal Medicine | Admitting: Internal Medicine

## 2023-10-21 DIAGNOSIS — R61 Generalized hyperhidrosis: Secondary | ICD-10-CM | POA: Diagnosis not present

## 2023-10-21 DIAGNOSIS — R042 Hemoptysis: Secondary | ICD-10-CM | POA: Diagnosis not present

## 2023-10-21 DIAGNOSIS — R590 Localized enlarged lymph nodes: Secondary | ICD-10-CM | POA: Diagnosis not present

## 2023-10-21 DIAGNOSIS — R0602 Shortness of breath: Secondary | ICD-10-CM | POA: Insufficient documentation

## 2023-10-21 DIAGNOSIS — Z955 Presence of coronary angioplasty implant and graft: Secondary | ICD-10-CM | POA: Diagnosis not present

## 2023-10-21 DIAGNOSIS — F17210 Nicotine dependence, cigarettes, uncomplicated: Secondary | ICD-10-CM | POA: Insufficient documentation

## 2023-10-21 DIAGNOSIS — R918 Other nonspecific abnormal finding of lung field: Secondary | ICD-10-CM | POA: Diagnosis present

## 2023-10-21 DIAGNOSIS — Z7901 Long term (current) use of anticoagulants: Secondary | ICD-10-CM | POA: Diagnosis not present

## 2023-10-21 DIAGNOSIS — C787 Secondary malignant neoplasm of liver and intrahepatic bile duct: Secondary | ICD-10-CM | POA: Diagnosis not present

## 2023-10-21 DIAGNOSIS — Z01818 Encounter for other preprocedural examination: Secondary | ICD-10-CM

## 2023-10-21 DIAGNOSIS — Z853 Personal history of malignant neoplasm of breast: Secondary | ICD-10-CM | POA: Insufficient documentation

## 2023-10-21 DIAGNOSIS — R160 Hepatomegaly, not elsewhere classified: Secondary | ICD-10-CM | POA: Diagnosis not present

## 2023-10-21 LAB — CBC
HCT: 43.2 % (ref 36.0–46.0)
Hemoglobin: 15.1 g/dL — ABNORMAL HIGH (ref 12.0–15.0)
MCH: 35.4 pg — ABNORMAL HIGH (ref 26.0–34.0)
MCHC: 35 g/dL (ref 30.0–36.0)
MCV: 101.4 fL — ABNORMAL HIGH (ref 80.0–100.0)
Platelets: 144 K/uL — ABNORMAL LOW (ref 150–400)
RBC: 4.26 MIL/uL (ref 3.87–5.11)
RDW: 15.4 % (ref 11.5–15.5)
WBC: 5.4 K/uL (ref 4.0–10.5)
nRBC: 0 % (ref 0.0–0.2)

## 2023-10-21 LAB — GLUCOSE, CAPILLARY: Glucose-Capillary: 140 mg/dL — ABNORMAL HIGH (ref 70–99)

## 2023-10-21 LAB — PROTIME-INR
INR: 1.1 (ref 0.8–1.2)
Prothrombin Time: 14.9 s (ref 11.4–15.2)

## 2023-10-21 MED ORDER — SODIUM CHLORIDE 0.9 % IV SOLN: INTRAVENOUS | Status: DC

## 2023-10-21 MED ORDER — LIDOCAINE HCL (PF) 1 % IJ SOLN
10.0000 mL | Freq: Once | INTRAMUSCULAR | Status: AC
  Administered 2023-10-21: 10 mL via INTRADERMAL
  Filled 2023-10-21: qty 10

## 2023-10-21 MED ORDER — FENTANYL CITRATE (PF) 100 MCG/2ML IJ SOLN
INTRAMUSCULAR | Status: AC | PRN
  Administered 2023-10-21 (×2): 50 ug via INTRAVENOUS

## 2023-10-21 MED ORDER — MIDAZOLAM HCL 2 MG/2ML IJ SOLN
INTRAMUSCULAR | Status: AC | PRN
  Administered 2023-10-21: 1 mg via INTRAVENOUS

## 2023-10-21 MED ORDER — FENTANYL CITRATE (PF) 100 MCG/2ML IJ SOLN
INTRAMUSCULAR | Status: AC
  Filled 2023-10-21: qty 2

## 2023-10-21 MED ORDER — MIDAZOLAM HCL 2 MG/2ML IJ SOLN
INTRAMUSCULAR | Status: AC
  Filled 2023-10-21: qty 2

## 2023-10-21 NOTE — Procedures (Signed)
 Pre procedural Dx: Liver masses  Post procedural Dx: Same  Technically successful U/S guided biopsy of liver mass, left lobe, 18Ga, gelfoamed access   EBL: None.   Complications: None immediate.   KANDICE Banner, MD Pager #: [phone]
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
## 2023-10-25 LAB — SURGICAL PATHOLOGY

## 2023-10-26 ENCOUNTER — Encounter: Payer: Self-pay | Admitting: Internal Medicine

## 2023-10-26 DIAGNOSIS — Z87891 Personal history of nicotine dependence: Secondary | ICD-10-CM

## 2023-10-26 DIAGNOSIS — R160 Hepatomegaly, not elsewhere classified: Secondary | ICD-10-CM

## 2023-10-26 DIAGNOSIS — R918 Other nonspecific abnormal finding of lung field: Secondary | ICD-10-CM

## 2023-10-26 DIAGNOSIS — Z853 Personal history of malignant neoplasm of breast: Secondary | ICD-10-CM

## 2023-10-26 NOTE — Telephone Encounter (Signed)
 Copied from CRM [id number]. Topic: Clinical - Lab/Test Results >> Oct 26, 2023 10:21 AM Ariana Riggs wrote: Reason for CRM: Patient is requesting a call back from Dr. Isaiah or nurse as she is very concerned about her biopsy results and would like to know what next steps she needs to take.

## 2023-10-26 NOTE — Telephone Encounter (Signed)
 I spoke with the patient. I told her Dr. Isaiah has been working in the ICU and is not scheduled back in the office until tomorrow. I told her we will call her back tomorrow with Dr. Isaiah response.

## 2023-10-27 ENCOUNTER — Ambulatory Visit
Admission: RE | Admit: 2023-10-27 | Discharge: 2023-10-27 | Disposition: A | Discharge status: Home / Self Care | Source: Ambulatory Visit | Attending: Oncology | Admitting: Oncology

## 2023-10-27 ENCOUNTER — Encounter: Payer: Self-pay | Admitting: *Deleted

## 2023-10-27 DIAGNOSIS — C3490 Malignant neoplasm of unspecified part of unspecified bronchus or lung: Secondary | ICD-10-CM

## 2023-10-27 DIAGNOSIS — Q048 Other specified congenital malformations of brain: Secondary | ICD-10-CM | POA: Diagnosis not present

## 2023-10-27 MED ORDER — GADOBUTROL 1 MMOL/ML IV SOLN
8.0000 mL | Freq: Once | INTRAVENOUS | Status: AC | PRN
  Administered 2023-10-27: 8 mL via INTRAVENOUS

## 2023-10-27 NOTE — Telephone Encounter (Signed)
 See patient message from 9/10.  Nothing further needed.

## 2023-10-27 NOTE — Telephone Encounter (Signed)
Oncology referral placed.

## 2023-10-27 NOTE — Progress Notes (Signed)
 Referral received from Dr. Isaiah for new diagnosis of small cell lung cancer. Pt previously seen by Dr. Melanee for breast cancer. Per Dr. Melanee, would like to see in clinic tomorrow and have brain MRI ordered. Order placed and message sent to scheduling for follow up appt. Will call pt with appts once scheduled.

## 2023-10-27 NOTE — Telephone Encounter (Signed)
 Noted, NFN

## 2023-10-28 ENCOUNTER — Inpatient Hospital Stay: Payer: Self-pay | Attending: Oncology | Admitting: Oncology

## 2023-10-28 ENCOUNTER — Encounter: Payer: Self-pay | Admitting: *Deleted

## 2023-10-28 ENCOUNTER — Encounter: Payer: Self-pay | Admitting: Oncology

## 2023-10-28 ENCOUNTER — Other Ambulatory Visit: Payer: Self-pay | Admitting: *Deleted

## 2023-10-28 VITALS — BP 129/62 | HR 66 | Temp 97.2°F | Resp 21 | Ht 66.0 in | Wt 196.6 lb

## 2023-10-28 DIAGNOSIS — Z79899 Other long term (current) drug therapy: Secondary | ICD-10-CM | POA: Insufficient documentation

## 2023-10-28 DIAGNOSIS — F17210 Nicotine dependence, cigarettes, uncomplicated: Secondary | ICD-10-CM | POA: Insufficient documentation

## 2023-10-28 DIAGNOSIS — C50912 Malignant neoplasm of unspecified site of left female breast: Secondary | ICD-10-CM | POA: Diagnosis not present

## 2023-10-28 DIAGNOSIS — Z7985 Long-term (current) use of injectable non-insulin antidiabetic drugs: Secondary | ICD-10-CM | POA: Diagnosis not present

## 2023-10-28 DIAGNOSIS — Z7982 Long term (current) use of aspirin: Secondary | ICD-10-CM | POA: Insufficient documentation

## 2023-10-28 DIAGNOSIS — Z515 Encounter for palliative care: Secondary | ICD-10-CM | POA: Diagnosis not present

## 2023-10-28 DIAGNOSIS — Z79811 Long term (current) use of aromatase inhibitors: Secondary | ICD-10-CM | POA: Insufficient documentation

## 2023-10-28 DIAGNOSIS — Z1721 Progesterone receptor positive status: Secondary | ICD-10-CM | POA: Diagnosis not present

## 2023-10-28 DIAGNOSIS — Z800 Family history of malignant neoplasm of digestive organs: Secondary | ICD-10-CM | POA: Diagnosis not present

## 2023-10-28 DIAGNOSIS — Z808 Family history of malignant neoplasm of other organs or systems: Secondary | ICD-10-CM | POA: Insufficient documentation

## 2023-10-28 DIAGNOSIS — Z8049 Family history of malignant neoplasm of other genital organs: Secondary | ICD-10-CM | POA: Insufficient documentation

## 2023-10-28 DIAGNOSIS — I5032 Chronic diastolic (congestive) heart failure: Secondary | ICD-10-CM | POA: Insufficient documentation

## 2023-10-28 DIAGNOSIS — E1122 Type 2 diabetes mellitus with diabetic chronic kidney disease: Secondary | ICD-10-CM | POA: Insufficient documentation

## 2023-10-28 DIAGNOSIS — Z7901 Long term (current) use of anticoagulants: Secondary | ICD-10-CM | POA: Diagnosis not present

## 2023-10-28 DIAGNOSIS — C50412 Malignant neoplasm of upper-outer quadrant of left female breast: Secondary | ICD-10-CM | POA: Insufficient documentation

## 2023-10-28 DIAGNOSIS — Z170 Estrogen receptor positive status [ER+]: Secondary | ICD-10-CM | POA: Insufficient documentation

## 2023-10-28 DIAGNOSIS — Z923 Personal history of irradiation: Secondary | ICD-10-CM | POA: Diagnosis not present

## 2023-10-28 DIAGNOSIS — I4819 Other persistent atrial fibrillation: Secondary | ICD-10-CM | POA: Diagnosis not present

## 2023-10-28 DIAGNOSIS — N183 Chronic kidney disease, stage 3 unspecified: Secondary | ICD-10-CM | POA: Diagnosis not present

## 2023-10-28 DIAGNOSIS — Z1732 Human epidermal growth factor receptor 2 negative status: Secondary | ICD-10-CM | POA: Diagnosis not present

## 2023-10-28 DIAGNOSIS — Z803 Family history of malignant neoplasm of breast: Secondary | ICD-10-CM | POA: Insufficient documentation

## 2023-10-28 DIAGNOSIS — I130 Hypertensive heart and chronic kidney disease with heart failure and stage 1 through stage 4 chronic kidney disease, or unspecified chronic kidney disease: Secondary | ICD-10-CM | POA: Insufficient documentation

## 2023-10-28 DIAGNOSIS — C3431 Malignant neoplasm of lower lobe, right bronchus or lung: Secondary | ICD-10-CM | POA: Diagnosis not present

## 2023-10-28 DIAGNOSIS — C7951 Secondary malignant neoplasm of bone: Secondary | ICD-10-CM | POA: Diagnosis not present

## 2023-10-28 DIAGNOSIS — I2510 Atherosclerotic heart disease of native coronary artery without angina pectoris: Secondary | ICD-10-CM | POA: Diagnosis not present

## 2023-10-28 DIAGNOSIS — Z7189 Other specified counseling: Secondary | ICD-10-CM

## 2023-10-28 DIAGNOSIS — R519 Headache, unspecified: Secondary | ICD-10-CM

## 2023-10-28 MED ORDER — BUTALBITAL-APAP-CAFFEINE 50-325-40 MG PO TABS: 1.0000 | ORAL_TABLET | Freq: Four times a day (QID) | ORAL | 1 refills | Status: DC | PRN

## 2023-10-28 NOTE — Progress Notes (Signed)
 Met with patient and her family during follow up visit with Dr. Melanee to discuss new diagnosis of small cell lung cancer. All questions answered during visit. Informed pt that will follow up by phone on Monday after she has had time to discuss all her options with family over the weekend. Contact info given to patient and instructed to call with any questions or needs. Pt and family verbalized understanding.

## 2023-10-29 ENCOUNTER — Encounter: Payer: Self-pay | Admitting: Oncology

## 2023-10-29 NOTE — Progress Notes (Signed)
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 Incidental findings, including: Aortic atherosclerosis (ICD10-I70.0), coronary artery atherosclerosis and emphysema (ICD10-J43.9). Pulmonary artery enlargement suggests pulmonary arterial hypertension. Left nephrolithiasis. Electronically Signed   By: Rockey Kilts M.D.   On: 10/05/2023 14:12     Assessment and plan- Patient is a 63 y.o. 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  I have also shown the images to them.  Patient has evidence of hypermetabolic 2 cm pleural-based right lower lobe lung nodule along with evidence of ipsilateral hilar and mediastinal adenopathy and hypermetabolism noted in the posterior aspect of the right hepatic lobe as well as left sacral metastases.  Liver biopsy was consistent with small cell lung cancer.  This constitutes stage IV disease.   Treatment will be palliative and not curative.  Treatment would consist of 4 induction cycles of carboplatin and etoposide along with Tecentriq given IV every 3 weeks followed by repeat scans.  If she has stable response of partial response she will go on to proceed with maintenance phase of treatment which would be lurbinectedin plus Tecentriq IV every 3 weeks until progression or toxicity.  Discussed acetaminophens of chemotherapy including all but not limited to nausea vomiting low blood counts risk of infections and hospitalization as well as hair loss and peripheral neuropathy.  Discussed risks and benefits of immunotherapy including all but not limited to autoimmune side effect such as colitis pneumonitis endocrinopathies hepatitis.  Carboplatin and Tecentriq will be given on day 1 and etoposide will be given on days 1 2 and 3 with growth factor support.  In a randomized trial of 403 patients with treatment-nave ES-SCLC, patients were assigned to receive carboplatin and etoposide with either atezolizumab or placebo for four cycles, followed by maintenance with either atezolizumab or placebo. At a median follow-up of 23 months, the median OS for the atezolizumab versus placebo group was 12.3 and 10.3 months (hazard ratio [HR] 0.76, 95% CI 0.60-0.95). Median PFS was also improved among those receiving atezolizumab (5.2 versus 4.3 months; HR for disease progression or death 0.77, 95% CI 0.62-0.96).   Patient would like to think about all this and then decide if she wishes to proceed with systemic treatment or not.  Patient will need port placement prior to chemotherapy and chemo teach.  Discussed results of MRI brain which does not show any evidence of intracranial metastatic disease or calvarial lesions.  Therefore etiology of her headache is unclear.  I have started her on as needed Fioricet  and we will make a referral to Dr. Buckley from neuro-oncology.  We will reach out to the patient next week to  see what she has decided.   Cancer Staging  Malignant neoplasm of upper-outer quadrant of left breast in female, estrogen receptor positive (HCC) Staging form: Breast, AJCC 8th Edition - Clinical stage from 08/24/2021: Stage IA (cT1c, cN0, cM0, G1, ER+, PR+, HER2-) - Signed by Melanee Annah BROCKS, MD on 08/24/2021 Histologic grading system: 3 grade system - Pathologic stage from 09/16/2021: Stage IA (pT1b, pN0, cM0, G2, ER+, PR+, HER2-, Oncotype DX score: 16) - Signed by Melanee Annah BROCKS, MD on 09/16/2021 Multigene prognostic tests performed: Oncotype DX Recurrence score range: Greater than or  equal to 11 Histologic grading system: 3 grade system  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 Goals of care, counseling/discussion   3. Headache, chronic daily      Dr. Annah Melanee, MD, MPH [facility] at [facility] [phone] 10/29/2023 4:15 PM

## 2023-10-31 ENCOUNTER — Encounter: Payer: Self-pay | Admitting: *Deleted

## 2023-10-31 ENCOUNTER — Telehealth: Payer: Self-pay

## 2023-10-31 DIAGNOSIS — C3431 Malignant neoplasm of lower lobe, right bronchus or lung: Secondary | ICD-10-CM

## 2023-10-31 NOTE — Telephone Encounter (Signed)
 Printed script of Fioricet  on printer.  Called pharmacy to confirm electronic script received.  Per representative script filled 10/28/23 and already picked up by patient.  No further follow up needed.

## 2023-10-31 NOTE — Progress Notes (Signed)
 Follow up phone call made to patient to question if has come to a decision regarding treatment options. Pt states that she does not want treatment but would rather focus on quality of life and enjoy her remaining time with her family. Reassurance and support provided during call. Informed pt that would like for her to see Josh in palliative care to discuss her palliative care options. Pt was in agreement and requests an in-person visit to further discuss. Informed that she will be called with her appt to see Josh and instructed to call back with any further questions or needs. Pt verbalized understanding. Nothing further needed at this time.  Dr. Melanee has been made aware of patient's decision regarding treatment.

## 2023-11-01 DIAGNOSIS — G4452 New daily persistent headache (NDPH): Secondary | ICD-10-CM | POA: Diagnosis not present

## 2023-11-01 DIAGNOSIS — C7951 Secondary malignant neoplasm of bone: Secondary | ICD-10-CM | POA: Diagnosis not present

## 2023-11-01 DIAGNOSIS — R52 Pain, unspecified: Secondary | ICD-10-CM | POA: Diagnosis not present

## 2023-11-01 DIAGNOSIS — C3490 Malignant neoplasm of unspecified part of unspecified bronchus or lung: Secondary | ICD-10-CM | POA: Diagnosis not present

## 2023-11-01 DIAGNOSIS — Z1331 Encounter for screening for depression: Secondary | ICD-10-CM | POA: Diagnosis not present

## 2023-11-01 DIAGNOSIS — R051 Acute cough: Secondary | ICD-10-CM | POA: Diagnosis not present

## 2023-11-01 DIAGNOSIS — J449 Chronic obstructive pulmonary disease, unspecified: Secondary | ICD-10-CM | POA: Diagnosis not present

## 2023-11-01 DIAGNOSIS — R0989 Other specified symptoms and signs involving the circulatory and respiratory systems: Secondary | ICD-10-CM | POA: Diagnosis not present

## 2023-11-01 DIAGNOSIS — F3170 Bipolar disorder, currently in remission, most recent episode unspecified: Secondary | ICD-10-CM | POA: Diagnosis not present

## 2023-11-01 DIAGNOSIS — F418 Other specified anxiety disorders: Secondary | ICD-10-CM | POA: Diagnosis not present

## 2023-11-01 DIAGNOSIS — C787 Secondary malignant neoplasm of liver and intrahepatic bile duct: Secondary | ICD-10-CM | POA: Diagnosis not present

## 2023-11-01 DIAGNOSIS — C3431 Malignant neoplasm of lower lobe, right bronchus or lung: Secondary | ICD-10-CM | POA: Diagnosis not present
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
## 2023-11-02 ENCOUNTER — Inpatient Hospital Stay (HOSPITAL_BASED_OUTPATIENT_CLINIC_OR_DEPARTMENT_OTHER): Admitting: Hospice and Palliative Medicine

## 2023-11-02 ENCOUNTER — Encounter: Payer: Self-pay | Admitting: Hospice and Palliative Medicine

## 2023-11-02 ENCOUNTER — Other Ambulatory Visit: Payer: Self-pay

## 2023-11-02 VITALS — BP 104/60 | HR 64 | Temp 98.2°F | Resp 18 | Ht 66.0 in | Wt 205.8 lb

## 2023-11-02 DIAGNOSIS — E1122 Type 2 diabetes mellitus with diabetic chronic kidney disease: Secondary | ICD-10-CM | POA: Diagnosis not present

## 2023-11-02 DIAGNOSIS — Z8049 Family history of malignant neoplasm of other genital organs: Secondary | ICD-10-CM | POA: Diagnosis not present

## 2023-11-02 DIAGNOSIS — I4819 Other persistent atrial fibrillation: Secondary | ICD-10-CM | POA: Diagnosis not present

## 2023-11-02 DIAGNOSIS — F17210 Nicotine dependence, cigarettes, uncomplicated: Secondary | ICD-10-CM | POA: Diagnosis not present

## 2023-11-02 DIAGNOSIS — Z170 Estrogen receptor positive status [ER+]: Secondary | ICD-10-CM | POA: Diagnosis not present

## 2023-11-02 DIAGNOSIS — Z803 Family history of malignant neoplasm of breast: Secondary | ICD-10-CM | POA: Diagnosis not present

## 2023-11-02 DIAGNOSIS — I2510 Atherosclerotic heart disease of native coronary artery without angina pectoris: Secondary | ICD-10-CM | POA: Diagnosis not present

## 2023-11-02 DIAGNOSIS — N183 Chronic kidney disease, stage 3 unspecified: Secondary | ICD-10-CM | POA: Diagnosis not present

## 2023-11-02 DIAGNOSIS — C7951 Secondary malignant neoplasm of bone: Secondary | ICD-10-CM | POA: Diagnosis not present

## 2023-11-02 DIAGNOSIS — C3431 Malignant neoplasm of lower lobe, right bronchus or lung: Secondary | ICD-10-CM | POA: Diagnosis not present

## 2023-11-02 DIAGNOSIS — I5032 Chronic diastolic (congestive) heart failure: Secondary | ICD-10-CM | POA: Diagnosis not present

## 2023-11-02 DIAGNOSIS — Z7901 Long term (current) use of anticoagulants: Secondary | ICD-10-CM | POA: Diagnosis not present

## 2023-11-02 DIAGNOSIS — Z800 Family history of malignant neoplasm of digestive organs: Secondary | ICD-10-CM | POA: Diagnosis not present

## 2023-11-02 DIAGNOSIS — C50412 Malignant neoplasm of upper-outer quadrant of left female breast: Secondary | ICD-10-CM | POA: Diagnosis not present

## 2023-11-02 DIAGNOSIS — Z515 Encounter for palliative care: Secondary | ICD-10-CM

## 2023-11-02 DIAGNOSIS — Z1721 Progesterone receptor positive status: Secondary | ICD-10-CM | POA: Diagnosis not present

## 2023-11-02 DIAGNOSIS — Z7982 Long term (current) use of aspirin: Secondary | ICD-10-CM | POA: Diagnosis not present

## 2023-11-02 DIAGNOSIS — Z79899 Other long term (current) drug therapy: Secondary | ICD-10-CM | POA: Diagnosis not present

## 2023-11-02 DIAGNOSIS — C50912 Malignant neoplasm of unspecified site of left female breast: Secondary | ICD-10-CM | POA: Diagnosis not present

## 2023-11-02 DIAGNOSIS — Z79811 Long term (current) use of aromatase inhibitors: Secondary | ICD-10-CM | POA: Diagnosis not present

## 2023-11-02 DIAGNOSIS — Z7985 Long-term (current) use of injectable non-insulin antidiabetic drugs: Secondary | ICD-10-CM | POA: Diagnosis not present

## 2023-11-02 DIAGNOSIS — Z923 Personal history of irradiation: Secondary | ICD-10-CM | POA: Diagnosis not present

## 2023-11-02 DIAGNOSIS — Z808 Family history of malignant neoplasm of other organs or systems: Secondary | ICD-10-CM | POA: Diagnosis not present

## 2023-11-02 DIAGNOSIS — I130 Hypertensive heart and chronic kidney disease with heart failure and stage 1 through stage 4 chronic kidney disease, or unspecified chronic kidney disease: Secondary | ICD-10-CM | POA: Diagnosis not present

## 2023-11-02 DIAGNOSIS — Z1732 Human epidermal growth factor receptor 2 negative status: Secondary | ICD-10-CM | POA: Diagnosis not present

## 2023-11-02 MED ORDER — PROCHLORPERAZINE MALEATE 10 MG PO TABS
10.0000 mg | ORAL_TABLET | Freq: Four times a day (QID) | ORAL | 0 refills | Status: DC | PRN
Start: 2023-11-02 — End: 2023-12-27

## 2023-11-02 NOTE — Progress Notes (Signed)
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 Percocet by PCP and that has helped.  Patient endorses anxiety and depression.  She has been on citalopram  for the past 15 years since her sobriety from crack cocaine.  She also takes trazodone  at bedtime to help her sleep.  Patient just started on diazepam by PCP.  Patient says that she is taking this twice and it has been helpful.  Patient says that she has previously completed ACP documents.  Her husband is her healthcare power of attorney.  Patient states clearly and repeatedly that she does not want to be resuscitated nor have her life prolonged artificially on machines.  She is in agreement with DNR/DNI.  I signed a DNR order for her to  take home.  PLAN: - Best supportive care - Referral to hospice - Prochlorperazine  as needed for nausea - Agree with as needed Percocet - Daily bowel regimen to prevent opioid-induced constipation - DNR/DNI - Follow-up telephone visit 3 to 4 weeks  Case and plan discussed with Dr. Melanee   Patient expressed understanding and was in agreement with this plan. She also understands that She can call the clinic at any time with any questions, concerns, or complaints.      Time Total: 30 minutes  Visit consisted of counseling and education dealing with the complex and emotionally intense issues of symptom management and palliative care in the setting of serious and potentially life-threatening illness.Greater than 50%  of this time was spent counseling and coordinating care related to the above assessment and plan.  Signed by: Fonda Mower, PhD, NP-C

## 2023-11-04 ENCOUNTER — Telehealth: Payer: Self-pay | Admitting: Internal Medicine

## 2023-11-04 NOTE — Telephone Encounter (Signed)
 Pt is starting hospice care on Tuesday, 11/08/23 she will not be making any future appts.

## 2023-11-08 DIAGNOSIS — G4733 Obstructive sleep apnea (adult) (pediatric): Secondary | ICD-10-CM | POA: Diagnosis not present

## 2023-11-08 DIAGNOSIS — I509 Heart failure, unspecified: Secondary | ICD-10-CM | POA: Diagnosis not present

## 2023-11-08 DIAGNOSIS — I2510 Atherosclerotic heart disease of native coronary artery without angina pectoris: Secondary | ICD-10-CM | POA: Diagnosis not present

## 2023-11-08 DIAGNOSIS — F32A Depression, unspecified: Secondary | ICD-10-CM | POA: Diagnosis not present

## 2023-11-08 DIAGNOSIS — I4891 Unspecified atrial fibrillation: Secondary | ICD-10-CM | POA: Diagnosis not present

## 2023-11-08 DIAGNOSIS — C787 Secondary malignant neoplasm of liver and intrahepatic bile duct: Secondary | ICD-10-CM | POA: Diagnosis not present

## 2023-11-08 DIAGNOSIS — C774 Secondary and unspecified malignant neoplasm of inguinal and lower limb lymph nodes: Secondary | ICD-10-CM | POA: Diagnosis not present

## 2023-11-08 DIAGNOSIS — J449 Chronic obstructive pulmonary disease, unspecified: Secondary | ICD-10-CM | POA: Diagnosis not present

## 2023-11-08 DIAGNOSIS — J9601 Acute respiratory failure with hypoxia: Secondary | ICD-10-CM | POA: Diagnosis not present

## 2023-11-08 DIAGNOSIS — C3431 Malignant neoplasm of lower lobe, right bronchus or lung: Secondary | ICD-10-CM | POA: Diagnosis not present

## 2023-11-08 DIAGNOSIS — K219 Gastro-esophageal reflux disease without esophagitis: Secondary | ICD-10-CM | POA: Diagnosis not present

## 2023-11-08 DIAGNOSIS — C7951 Secondary malignant neoplasm of bone: Secondary | ICD-10-CM | POA: Diagnosis not present

## 2023-11-09 DIAGNOSIS — I2510 Atherosclerotic heart disease of native coronary artery without angina pectoris: Secondary | ICD-10-CM | POA: Diagnosis not present

## 2023-11-09 DIAGNOSIS — J449 Chronic obstructive pulmonary disease, unspecified: Secondary | ICD-10-CM | POA: Diagnosis not present

## 2023-11-09 DIAGNOSIS — I4891 Unspecified atrial fibrillation: Secondary | ICD-10-CM | POA: Diagnosis not present

## 2023-11-09 DIAGNOSIS — C774 Secondary and unspecified malignant neoplasm of inguinal and lower limb lymph nodes: Secondary | ICD-10-CM | POA: Diagnosis not present

## 2023-11-09 DIAGNOSIS — F32A Depression, unspecified: Secondary | ICD-10-CM | POA: Diagnosis not present

## 2023-11-09 DIAGNOSIS — I509 Heart failure, unspecified: Secondary | ICD-10-CM | POA: Diagnosis not present

## 2023-11-09 DIAGNOSIS — J9601 Acute respiratory failure with hypoxia: Secondary | ICD-10-CM | POA: Diagnosis not present

## 2023-11-09 DIAGNOSIS — C787 Secondary malignant neoplasm of liver and intrahepatic bile duct: Secondary | ICD-10-CM | POA: Diagnosis not present

## 2023-11-09 DIAGNOSIS — K219 Gastro-esophageal reflux disease without esophagitis: Secondary | ICD-10-CM | POA: Diagnosis not present

## 2023-11-09 DIAGNOSIS — G4733 Obstructive sleep apnea (adult) (pediatric): Secondary | ICD-10-CM | POA: Diagnosis not present

## 2023-11-09 DIAGNOSIS — C7951 Secondary malignant neoplasm of bone: Secondary | ICD-10-CM | POA: Diagnosis not present

## 2023-11-10 DIAGNOSIS — I4891 Unspecified atrial fibrillation: Secondary | ICD-10-CM | POA: Diagnosis not present

## 2023-11-10 DIAGNOSIS — K219 Gastro-esophageal reflux disease without esophagitis: Secondary | ICD-10-CM | POA: Diagnosis not present

## 2023-11-10 DIAGNOSIS — I2510 Atherosclerotic heart disease of native coronary artery without angina pectoris: Secondary | ICD-10-CM | POA: Diagnosis not present

## 2023-11-10 DIAGNOSIS — J449 Chronic obstructive pulmonary disease, unspecified: Secondary | ICD-10-CM | POA: Diagnosis not present

## 2023-11-10 DIAGNOSIS — C787 Secondary malignant neoplasm of liver and intrahepatic bile duct: Secondary | ICD-10-CM | POA: Diagnosis not present

## 2023-11-10 DIAGNOSIS — J9601 Acute respiratory failure with hypoxia: Secondary | ICD-10-CM | POA: Diagnosis not present

## 2023-11-10 DIAGNOSIS — C774 Secondary and unspecified malignant neoplasm of inguinal and lower limb lymph nodes: Secondary | ICD-10-CM | POA: Diagnosis not present

## 2023-11-10 DIAGNOSIS — C7951 Secondary malignant neoplasm of bone: Secondary | ICD-10-CM | POA: Diagnosis not present

## 2023-11-10 DIAGNOSIS — G4733 Obstructive sleep apnea (adult) (pediatric): Secondary | ICD-10-CM | POA: Diagnosis not present

## 2023-11-10 DIAGNOSIS — I509 Heart failure, unspecified: Secondary | ICD-10-CM | POA: Diagnosis not present

## 2023-11-10 DIAGNOSIS — F32A Depression, unspecified: Secondary | ICD-10-CM | POA: Diagnosis not present

## 2023-11-11 DIAGNOSIS — G4733 Obstructive sleep apnea (adult) (pediatric): Secondary | ICD-10-CM | POA: Diagnosis not present

## 2023-11-11 DIAGNOSIS — C774 Secondary and unspecified malignant neoplasm of inguinal and lower limb lymph nodes: Secondary | ICD-10-CM | POA: Diagnosis not present

## 2023-11-11 DIAGNOSIS — I2510 Atherosclerotic heart disease of native coronary artery without angina pectoris: Secondary | ICD-10-CM | POA: Diagnosis not present

## 2023-11-11 DIAGNOSIS — K219 Gastro-esophageal reflux disease without esophagitis: Secondary | ICD-10-CM | POA: Diagnosis not present

## 2023-11-11 DIAGNOSIS — C7951 Secondary malignant neoplasm of bone: Secondary | ICD-10-CM | POA: Diagnosis not present

## 2023-11-11 DIAGNOSIS — I509 Heart failure, unspecified: Secondary | ICD-10-CM | POA: Diagnosis not present

## 2023-11-11 DIAGNOSIS — C787 Secondary malignant neoplasm of liver and intrahepatic bile duct: Secondary | ICD-10-CM | POA: Diagnosis not present

## 2023-11-11 DIAGNOSIS — J449 Chronic obstructive pulmonary disease, unspecified: Secondary | ICD-10-CM | POA: Diagnosis not present

## 2023-11-11 DIAGNOSIS — C3431 Malignant neoplasm of lower lobe, right bronchus or lung: Secondary | ICD-10-CM | POA: Diagnosis not present

## 2023-11-11 DIAGNOSIS — J9601 Acute respiratory failure with hypoxia: Secondary | ICD-10-CM | POA: Diagnosis not present

## 2023-11-11 DIAGNOSIS — F32A Depression, unspecified: Secondary | ICD-10-CM | POA: Diagnosis not present

## 2023-11-11 DIAGNOSIS — I4891 Unspecified atrial fibrillation: Secondary | ICD-10-CM | POA: Diagnosis not present

## 2023-11-12 DIAGNOSIS — I509 Heart failure, unspecified: Secondary | ICD-10-CM | POA: Diagnosis not present

## 2023-11-12 DIAGNOSIS — F32A Depression, unspecified: Secondary | ICD-10-CM | POA: Diagnosis not present

## 2023-11-12 DIAGNOSIS — I4891 Unspecified atrial fibrillation: Secondary | ICD-10-CM | POA: Diagnosis not present

## 2023-11-12 DIAGNOSIS — I2510 Atherosclerotic heart disease of native coronary artery without angina pectoris: Secondary | ICD-10-CM | POA: Diagnosis not present

## 2023-11-12 DIAGNOSIS — C7951 Secondary malignant neoplasm of bone: Secondary | ICD-10-CM | POA: Diagnosis not present

## 2023-11-12 DIAGNOSIS — C787 Secondary malignant neoplasm of liver and intrahepatic bile duct: Secondary | ICD-10-CM | POA: Diagnosis not present

## 2023-11-12 DIAGNOSIS — G4733 Obstructive sleep apnea (adult) (pediatric): Secondary | ICD-10-CM | POA: Diagnosis not present

## 2023-11-12 DIAGNOSIS — C774 Secondary and unspecified malignant neoplasm of inguinal and lower limb lymph nodes: Secondary | ICD-10-CM | POA: Diagnosis not present

## 2023-11-12 DIAGNOSIS — J449 Chronic obstructive pulmonary disease, unspecified: Secondary | ICD-10-CM | POA: Diagnosis not present

## 2023-11-12 DIAGNOSIS — J9601 Acute respiratory failure with hypoxia: Secondary | ICD-10-CM | POA: Diagnosis not present

## 2023-11-12 DIAGNOSIS — K219 Gastro-esophageal reflux disease without esophagitis: Secondary | ICD-10-CM | POA: Diagnosis not present

## 2023-11-13 DIAGNOSIS — G4733 Obstructive sleep apnea (adult) (pediatric): Secondary | ICD-10-CM | POA: Diagnosis not present

## 2023-11-13 DIAGNOSIS — I2510 Atherosclerotic heart disease of native coronary artery without angina pectoris: Secondary | ICD-10-CM | POA: Diagnosis not present

## 2023-11-13 DIAGNOSIS — I4891 Unspecified atrial fibrillation: Secondary | ICD-10-CM | POA: Diagnosis not present

## 2023-11-13 DIAGNOSIS — C787 Secondary malignant neoplasm of liver and intrahepatic bile duct: Secondary | ICD-10-CM | POA: Diagnosis not present

## 2023-11-13 DIAGNOSIS — C774 Secondary and unspecified malignant neoplasm of inguinal and lower limb lymph nodes: Secondary | ICD-10-CM | POA: Diagnosis not present

## 2023-11-13 DIAGNOSIS — F32A Depression, unspecified: Secondary | ICD-10-CM | POA: Diagnosis not present

## 2023-11-13 DIAGNOSIS — I509 Heart failure, unspecified: Secondary | ICD-10-CM | POA: Diagnosis not present

## 2023-11-13 DIAGNOSIS — C3431 Malignant neoplasm of lower lobe, right bronchus or lung: Secondary | ICD-10-CM | POA: Diagnosis not present

## 2023-11-13 DIAGNOSIS — J9601 Acute respiratory failure with hypoxia: Secondary | ICD-10-CM | POA: Diagnosis not present

## 2023-11-13 DIAGNOSIS — C7951 Secondary malignant neoplasm of bone: Secondary | ICD-10-CM | POA: Diagnosis not present

## 2023-11-13 DIAGNOSIS — J449 Chronic obstructive pulmonary disease, unspecified: Secondary | ICD-10-CM | POA: Diagnosis not present

## 2023-11-13 DIAGNOSIS — K219 Gastro-esophageal reflux disease without esophagitis: Secondary | ICD-10-CM | POA: Diagnosis not present

## 2023-11-14 DIAGNOSIS — C774 Secondary and unspecified malignant neoplasm of inguinal and lower limb lymph nodes: Secondary | ICD-10-CM | POA: Diagnosis not present

## 2023-11-14 DIAGNOSIS — G4733 Obstructive sleep apnea (adult) (pediatric): Secondary | ICD-10-CM | POA: Diagnosis not present

## 2023-11-14 DIAGNOSIS — I2510 Atherosclerotic heart disease of native coronary artery without angina pectoris: Secondary | ICD-10-CM | POA: Diagnosis not present

## 2023-11-14 DIAGNOSIS — J9601 Acute respiratory failure with hypoxia: Secondary | ICD-10-CM | POA: Diagnosis not present

## 2023-11-14 DIAGNOSIS — C787 Secondary malignant neoplasm of liver and intrahepatic bile duct: Secondary | ICD-10-CM | POA: Diagnosis not present

## 2023-11-14 DIAGNOSIS — J449 Chronic obstructive pulmonary disease, unspecified: Secondary | ICD-10-CM | POA: Diagnosis not present

## 2023-11-14 DIAGNOSIS — K219 Gastro-esophageal reflux disease without esophagitis: Secondary | ICD-10-CM | POA: Diagnosis not present

## 2023-11-14 DIAGNOSIS — F32A Depression, unspecified: Secondary | ICD-10-CM | POA: Diagnosis not present

## 2023-11-14 DIAGNOSIS — I4891 Unspecified atrial fibrillation: Secondary | ICD-10-CM | POA: Diagnosis not present

## 2023-11-14 DIAGNOSIS — I509 Heart failure, unspecified: Secondary | ICD-10-CM | POA: Diagnosis not present

## 2023-11-14 DIAGNOSIS — C7951 Secondary malignant neoplasm of bone: Secondary | ICD-10-CM | POA: Diagnosis not present

## 2023-11-15 DIAGNOSIS — C7951 Secondary malignant neoplasm of bone: Secondary | ICD-10-CM | POA: Diagnosis not present

## 2023-11-15 DIAGNOSIS — J9601 Acute respiratory failure with hypoxia: Secondary | ICD-10-CM | POA: Diagnosis not present

## 2023-11-15 DIAGNOSIS — J449 Chronic obstructive pulmonary disease, unspecified: Secondary | ICD-10-CM | POA: Diagnosis not present

## 2023-11-15 DIAGNOSIS — G4733 Obstructive sleep apnea (adult) (pediatric): Secondary | ICD-10-CM | POA: Diagnosis not present

## 2023-11-15 DIAGNOSIS — K219 Gastro-esophageal reflux disease without esophagitis: Secondary | ICD-10-CM | POA: Diagnosis not present

## 2023-11-15 DIAGNOSIS — C787 Secondary malignant neoplasm of liver and intrahepatic bile duct: Secondary | ICD-10-CM | POA: Diagnosis not present

## 2023-11-15 DIAGNOSIS — I509 Heart failure, unspecified: Secondary | ICD-10-CM | POA: Diagnosis not present

## 2023-11-15 DIAGNOSIS — I2510 Atherosclerotic heart disease of native coronary artery without angina pectoris: Secondary | ICD-10-CM | POA: Diagnosis not present

## 2023-11-15 DIAGNOSIS — I4891 Unspecified atrial fibrillation: Secondary | ICD-10-CM | POA: Diagnosis not present

## 2023-11-15 DIAGNOSIS — C3431 Malignant neoplasm of lower lobe, right bronchus or lung: Secondary | ICD-10-CM | POA: Diagnosis not present

## 2023-11-15 DIAGNOSIS — C774 Secondary and unspecified malignant neoplasm of inguinal and lower limb lymph nodes: Secondary | ICD-10-CM | POA: Diagnosis not present

## 2023-11-15 DIAGNOSIS — F32A Depression, unspecified: Secondary | ICD-10-CM | POA: Diagnosis not present

## 2023-11-16 DIAGNOSIS — I509 Heart failure, unspecified: Secondary | ICD-10-CM | POA: Diagnosis not present

## 2023-11-16 DIAGNOSIS — F32A Depression, unspecified: Secondary | ICD-10-CM | POA: Diagnosis not present

## 2023-11-16 DIAGNOSIS — I4891 Unspecified atrial fibrillation: Secondary | ICD-10-CM | POA: Diagnosis not present

## 2023-11-16 DIAGNOSIS — J9601 Acute respiratory failure with hypoxia: Secondary | ICD-10-CM | POA: Diagnosis not present

## 2023-11-16 DIAGNOSIS — C774 Secondary and unspecified malignant neoplasm of inguinal and lower limb lymph nodes: Secondary | ICD-10-CM | POA: Diagnosis not present

## 2023-11-16 DIAGNOSIS — G4733 Obstructive sleep apnea (adult) (pediatric): Secondary | ICD-10-CM | POA: Diagnosis not present

## 2023-11-16 DIAGNOSIS — I2510 Atherosclerotic heart disease of native coronary artery without angina pectoris: Secondary | ICD-10-CM | POA: Diagnosis not present

## 2023-11-16 DIAGNOSIS — J449 Chronic obstructive pulmonary disease, unspecified: Secondary | ICD-10-CM | POA: Diagnosis not present

## 2023-11-16 DIAGNOSIS — K219 Gastro-esophageal reflux disease without esophagitis: Secondary | ICD-10-CM | POA: Diagnosis not present

## 2023-11-16 DIAGNOSIS — C7951 Secondary malignant neoplasm of bone: Secondary | ICD-10-CM | POA: Diagnosis not present

## 2023-11-16 DIAGNOSIS — C3431 Malignant neoplasm of lower lobe, right bronchus or lung: Secondary | ICD-10-CM | POA: Diagnosis not present

## 2023-11-16 DIAGNOSIS — C787 Secondary malignant neoplasm of liver and intrahepatic bile duct: Secondary | ICD-10-CM | POA: Diagnosis not present

## 2023-11-17 DIAGNOSIS — J9601 Acute respiratory failure with hypoxia: Secondary | ICD-10-CM | POA: Diagnosis not present

## 2023-11-17 DIAGNOSIS — C787 Secondary malignant neoplasm of liver and intrahepatic bile duct: Secondary | ICD-10-CM | POA: Diagnosis not present

## 2023-11-17 DIAGNOSIS — G4733 Obstructive sleep apnea (adult) (pediatric): Secondary | ICD-10-CM | POA: Diagnosis not present

## 2023-11-17 DIAGNOSIS — C7951 Secondary malignant neoplasm of bone: Secondary | ICD-10-CM | POA: Diagnosis not present

## 2023-11-17 DIAGNOSIS — J449 Chronic obstructive pulmonary disease, unspecified: Secondary | ICD-10-CM | POA: Diagnosis not present

## 2023-11-17 DIAGNOSIS — F32A Depression, unspecified: Secondary | ICD-10-CM | POA: Diagnosis not present

## 2023-11-17 DIAGNOSIS — I2510 Atherosclerotic heart disease of native coronary artery without angina pectoris: Secondary | ICD-10-CM | POA: Diagnosis not present

## 2023-11-17 DIAGNOSIS — K219 Gastro-esophageal reflux disease without esophagitis: Secondary | ICD-10-CM | POA: Diagnosis not present

## 2023-11-17 DIAGNOSIS — I509 Heart failure, unspecified: Secondary | ICD-10-CM | POA: Diagnosis not present

## 2023-11-17 DIAGNOSIS — C774 Secondary and unspecified malignant neoplasm of inguinal and lower limb lymph nodes: Secondary | ICD-10-CM | POA: Diagnosis not present

## 2023-11-17 DIAGNOSIS — I4891 Unspecified atrial fibrillation: Secondary | ICD-10-CM | POA: Diagnosis not present

## 2023-11-18 DIAGNOSIS — G4733 Obstructive sleep apnea (adult) (pediatric): Secondary | ICD-10-CM | POA: Diagnosis not present

## 2023-11-18 DIAGNOSIS — C3431 Malignant neoplasm of lower lobe, right bronchus or lung: Secondary | ICD-10-CM | POA: Diagnosis not present

## 2023-11-18 DIAGNOSIS — C7951 Secondary malignant neoplasm of bone: Secondary | ICD-10-CM | POA: Diagnosis not present

## 2023-11-18 DIAGNOSIS — F32A Depression, unspecified: Secondary | ICD-10-CM | POA: Diagnosis not present

## 2023-11-18 DIAGNOSIS — K219 Gastro-esophageal reflux disease without esophagitis: Secondary | ICD-10-CM | POA: Diagnosis not present

## 2023-11-18 DIAGNOSIS — J9601 Acute respiratory failure with hypoxia: Secondary | ICD-10-CM | POA: Diagnosis not present

## 2023-11-18 DIAGNOSIS — J449 Chronic obstructive pulmonary disease, unspecified: Secondary | ICD-10-CM | POA: Diagnosis not present

## 2023-11-18 DIAGNOSIS — I4891 Unspecified atrial fibrillation: Secondary | ICD-10-CM | POA: Diagnosis not present

## 2023-11-18 DIAGNOSIS — C774 Secondary and unspecified malignant neoplasm of inguinal and lower limb lymph nodes: Secondary | ICD-10-CM | POA: Diagnosis not present

## 2023-11-18 DIAGNOSIS — I2510 Atherosclerotic heart disease of native coronary artery without angina pectoris: Secondary | ICD-10-CM | POA: Diagnosis not present

## 2023-11-18 DIAGNOSIS — I509 Heart failure, unspecified: Secondary | ICD-10-CM | POA: Diagnosis not present

## 2023-11-18 DIAGNOSIS — C787 Secondary malignant neoplasm of liver and intrahepatic bile duct: Secondary | ICD-10-CM | POA: Diagnosis not present

## 2023-11-19 DIAGNOSIS — C774 Secondary and unspecified malignant neoplasm of inguinal and lower limb lymph nodes: Secondary | ICD-10-CM | POA: Diagnosis not present

## 2023-11-19 DIAGNOSIS — C787 Secondary malignant neoplasm of liver and intrahepatic bile duct: Secondary | ICD-10-CM | POA: Diagnosis not present

## 2023-11-19 DIAGNOSIS — F32A Depression, unspecified: Secondary | ICD-10-CM | POA: Diagnosis not present

## 2023-11-19 DIAGNOSIS — C7951 Secondary malignant neoplasm of bone: Secondary | ICD-10-CM | POA: Diagnosis not present

## 2023-11-19 DIAGNOSIS — K219 Gastro-esophageal reflux disease without esophagitis: Secondary | ICD-10-CM | POA: Diagnosis not present

## 2023-11-19 DIAGNOSIS — G4733 Obstructive sleep apnea (adult) (pediatric): Secondary | ICD-10-CM | POA: Diagnosis not present

## 2023-11-19 DIAGNOSIS — I2510 Atherosclerotic heart disease of native coronary artery without angina pectoris: Secondary | ICD-10-CM | POA: Diagnosis not present

## 2023-11-19 DIAGNOSIS — C3431 Malignant neoplasm of lower lobe, right bronchus or lung: Secondary | ICD-10-CM | POA: Diagnosis not present

## 2023-11-19 DIAGNOSIS — J449 Chronic obstructive pulmonary disease, unspecified: Secondary | ICD-10-CM | POA: Diagnosis not present

## 2023-11-19 DIAGNOSIS — I509 Heart failure, unspecified: Secondary | ICD-10-CM | POA: Diagnosis not present

## 2023-11-19 DIAGNOSIS — I4891 Unspecified atrial fibrillation: Secondary | ICD-10-CM | POA: Diagnosis not present

## 2023-11-19 DIAGNOSIS — J9601 Acute respiratory failure with hypoxia: Secondary | ICD-10-CM | POA: Diagnosis not present

## 2023-11-20 DIAGNOSIS — C7951 Secondary malignant neoplasm of bone: Secondary | ICD-10-CM | POA: Diagnosis not present

## 2023-11-20 DIAGNOSIS — I4891 Unspecified atrial fibrillation: Secondary | ICD-10-CM | POA: Diagnosis not present

## 2023-11-20 DIAGNOSIS — J9601 Acute respiratory failure with hypoxia: Secondary | ICD-10-CM | POA: Diagnosis not present

## 2023-11-20 DIAGNOSIS — J449 Chronic obstructive pulmonary disease, unspecified: Secondary | ICD-10-CM | POA: Diagnosis not present

## 2023-11-20 DIAGNOSIS — C787 Secondary malignant neoplasm of liver and intrahepatic bile duct: Secondary | ICD-10-CM | POA: Diagnosis not present

## 2023-11-20 DIAGNOSIS — K219 Gastro-esophageal reflux disease without esophagitis: Secondary | ICD-10-CM | POA: Diagnosis not present

## 2023-11-20 DIAGNOSIS — F32A Depression, unspecified: Secondary | ICD-10-CM | POA: Diagnosis not present

## 2023-11-20 DIAGNOSIS — G4733 Obstructive sleep apnea (adult) (pediatric): Secondary | ICD-10-CM | POA: Diagnosis not present

## 2023-11-20 DIAGNOSIS — I2510 Atherosclerotic heart disease of native coronary artery without angina pectoris: Secondary | ICD-10-CM | POA: Diagnosis not present

## 2023-11-20 DIAGNOSIS — C774 Secondary and unspecified malignant neoplasm of inguinal and lower limb lymph nodes: Secondary | ICD-10-CM | POA: Diagnosis not present

## 2023-11-20 DIAGNOSIS — I509 Heart failure, unspecified: Secondary | ICD-10-CM | POA: Diagnosis not present

## 2023-11-21 DIAGNOSIS — I4891 Unspecified atrial fibrillation: Secondary | ICD-10-CM | POA: Diagnosis not present

## 2023-11-21 DIAGNOSIS — I2510 Atherosclerotic heart disease of native coronary artery without angina pectoris: Secondary | ICD-10-CM | POA: Diagnosis not present

## 2023-11-21 DIAGNOSIS — G4733 Obstructive sleep apnea (adult) (pediatric): Secondary | ICD-10-CM | POA: Diagnosis not present

## 2023-11-21 DIAGNOSIS — I509 Heart failure, unspecified: Secondary | ICD-10-CM | POA: Diagnosis not present

## 2023-11-21 DIAGNOSIS — C774 Secondary and unspecified malignant neoplasm of inguinal and lower limb lymph nodes: Secondary | ICD-10-CM | POA: Diagnosis not present

## 2023-11-21 DIAGNOSIS — C3431 Malignant neoplasm of lower lobe, right bronchus or lung: Secondary | ICD-10-CM | POA: Diagnosis not present

## 2023-11-21 DIAGNOSIS — J9601 Acute respiratory failure with hypoxia: Secondary | ICD-10-CM | POA: Diagnosis not present

## 2023-11-21 DIAGNOSIS — C7951 Secondary malignant neoplasm of bone: Secondary | ICD-10-CM | POA: Diagnosis not present

## 2023-11-21 DIAGNOSIS — J449 Chronic obstructive pulmonary disease, unspecified: Secondary | ICD-10-CM | POA: Diagnosis not present

## 2023-11-21 DIAGNOSIS — F32A Depression, unspecified: Secondary | ICD-10-CM | POA: Diagnosis not present

## 2023-11-21 DIAGNOSIS — K219 Gastro-esophageal reflux disease without esophagitis: Secondary | ICD-10-CM | POA: Diagnosis not present

## 2023-11-21 DIAGNOSIS — C787 Secondary malignant neoplasm of liver and intrahepatic bile duct: Secondary | ICD-10-CM | POA: Diagnosis not present

## 2023-11-22 DIAGNOSIS — I4891 Unspecified atrial fibrillation: Secondary | ICD-10-CM | POA: Diagnosis not present

## 2023-11-22 DIAGNOSIS — K219 Gastro-esophageal reflux disease without esophagitis: Secondary | ICD-10-CM | POA: Diagnosis not present

## 2023-11-22 DIAGNOSIS — I2510 Atherosclerotic heart disease of native coronary artery without angina pectoris: Secondary | ICD-10-CM | POA: Diagnosis not present

## 2023-11-22 DIAGNOSIS — I509 Heart failure, unspecified: Secondary | ICD-10-CM | POA: Diagnosis not present

## 2023-11-22 DIAGNOSIS — C787 Secondary malignant neoplasm of liver and intrahepatic bile duct: Secondary | ICD-10-CM | POA: Diagnosis not present

## 2023-11-22 DIAGNOSIS — C3431 Malignant neoplasm of lower lobe, right bronchus or lung: Secondary | ICD-10-CM | POA: Diagnosis not present

## 2023-11-22 DIAGNOSIS — J449 Chronic obstructive pulmonary disease, unspecified: Secondary | ICD-10-CM | POA: Diagnosis not present

## 2023-11-22 DIAGNOSIS — C774 Secondary and unspecified malignant neoplasm of inguinal and lower limb lymph nodes: Secondary | ICD-10-CM | POA: Diagnosis not present

## 2023-11-22 DIAGNOSIS — C7951 Secondary malignant neoplasm of bone: Secondary | ICD-10-CM | POA: Diagnosis not present

## 2023-11-22 DIAGNOSIS — G4733 Obstructive sleep apnea (adult) (pediatric): Secondary | ICD-10-CM | POA: Diagnosis not present

## 2023-11-22 DIAGNOSIS — J9601 Acute respiratory failure with hypoxia: Secondary | ICD-10-CM | POA: Diagnosis not present

## 2023-11-22 DIAGNOSIS — F32A Depression, unspecified: Secondary | ICD-10-CM | POA: Diagnosis not present

## 2023-11-23 ENCOUNTER — Telehealth: Admitting: Hospice and Palliative Medicine

## 2023-11-23 DIAGNOSIS — J449 Chronic obstructive pulmonary disease, unspecified: Secondary | ICD-10-CM | POA: Diagnosis not present

## 2023-11-23 DIAGNOSIS — G4733 Obstructive sleep apnea (adult) (pediatric): Secondary | ICD-10-CM | POA: Diagnosis not present

## 2023-11-23 DIAGNOSIS — I4891 Unspecified atrial fibrillation: Secondary | ICD-10-CM | POA: Diagnosis not present

## 2023-11-23 DIAGNOSIS — C774 Secondary and unspecified malignant neoplasm of inguinal and lower limb lymph nodes: Secondary | ICD-10-CM | POA: Diagnosis not present

## 2023-11-23 DIAGNOSIS — K219 Gastro-esophageal reflux disease without esophagitis: Secondary | ICD-10-CM | POA: Diagnosis not present

## 2023-11-23 DIAGNOSIS — J9601 Acute respiratory failure with hypoxia: Secondary | ICD-10-CM | POA: Diagnosis not present

## 2023-11-23 DIAGNOSIS — C7951 Secondary malignant neoplasm of bone: Secondary | ICD-10-CM | POA: Diagnosis not present

## 2023-11-23 DIAGNOSIS — I2510 Atherosclerotic heart disease of native coronary artery without angina pectoris: Secondary | ICD-10-CM | POA: Diagnosis not present

## 2023-11-23 DIAGNOSIS — C3431 Malignant neoplasm of lower lobe, right bronchus or lung: Secondary | ICD-10-CM | POA: Diagnosis not present

## 2023-11-23 DIAGNOSIS — I509 Heart failure, unspecified: Secondary | ICD-10-CM | POA: Diagnosis not present

## 2023-11-23 DIAGNOSIS — F32A Depression, unspecified: Secondary | ICD-10-CM | POA: Diagnosis not present

## 2023-11-23 DIAGNOSIS — C787 Secondary malignant neoplasm of liver and intrahepatic bile duct: Secondary | ICD-10-CM | POA: Diagnosis not present

## 2023-11-24 DIAGNOSIS — I509 Heart failure, unspecified: Secondary | ICD-10-CM | POA: Diagnosis not present

## 2023-11-24 DIAGNOSIS — J449 Chronic obstructive pulmonary disease, unspecified: Secondary | ICD-10-CM | POA: Diagnosis not present

## 2023-11-24 DIAGNOSIS — J9601 Acute respiratory failure with hypoxia: Secondary | ICD-10-CM | POA: Diagnosis not present

## 2023-11-24 DIAGNOSIS — C3431 Malignant neoplasm of lower lobe, right bronchus or lung: Secondary | ICD-10-CM | POA: Diagnosis not present

## 2023-11-24 DIAGNOSIS — I2510 Atherosclerotic heart disease of native coronary artery without angina pectoris: Secondary | ICD-10-CM | POA: Diagnosis not present

## 2023-11-24 DIAGNOSIS — G4733 Obstructive sleep apnea (adult) (pediatric): Secondary | ICD-10-CM | POA: Diagnosis not present

## 2023-11-24 DIAGNOSIS — K219 Gastro-esophageal reflux disease without esophagitis: Secondary | ICD-10-CM | POA: Diagnosis not present

## 2023-11-24 DIAGNOSIS — C774 Secondary and unspecified malignant neoplasm of inguinal and lower limb lymph nodes: Secondary | ICD-10-CM | POA: Diagnosis not present

## 2023-11-24 DIAGNOSIS — C787 Secondary malignant neoplasm of liver and intrahepatic bile duct: Secondary | ICD-10-CM | POA: Diagnosis not present

## 2023-11-24 DIAGNOSIS — F32A Depression, unspecified: Secondary | ICD-10-CM | POA: Diagnosis not present

## 2023-11-24 DIAGNOSIS — I4891 Unspecified atrial fibrillation: Secondary | ICD-10-CM | POA: Diagnosis not present

## 2023-11-24 DIAGNOSIS — C7951 Secondary malignant neoplasm of bone: Secondary | ICD-10-CM | POA: Diagnosis not present

## 2023-11-25 DIAGNOSIS — K219 Gastro-esophageal reflux disease without esophagitis: Secondary | ICD-10-CM | POA: Diagnosis not present

## 2023-11-25 DIAGNOSIS — C7951 Secondary malignant neoplasm of bone: Secondary | ICD-10-CM | POA: Diagnosis not present

## 2023-11-25 DIAGNOSIS — J449 Chronic obstructive pulmonary disease, unspecified: Secondary | ICD-10-CM | POA: Diagnosis not present

## 2023-11-25 DIAGNOSIS — C787 Secondary malignant neoplasm of liver and intrahepatic bile duct: Secondary | ICD-10-CM | POA: Diagnosis not present

## 2023-11-25 DIAGNOSIS — C3431 Malignant neoplasm of lower lobe, right bronchus or lung: Secondary | ICD-10-CM | POA: Diagnosis not present

## 2023-11-25 DIAGNOSIS — I4891 Unspecified atrial fibrillation: Secondary | ICD-10-CM | POA: Diagnosis not present

## 2023-11-25 DIAGNOSIS — I2510 Atherosclerotic heart disease of native coronary artery without angina pectoris: Secondary | ICD-10-CM | POA: Diagnosis not present

## 2023-11-25 DIAGNOSIS — I509 Heart failure, unspecified: Secondary | ICD-10-CM | POA: Diagnosis not present

## 2023-11-25 DIAGNOSIS — G4733 Obstructive sleep apnea (adult) (pediatric): Secondary | ICD-10-CM | POA: Diagnosis not present

## 2023-11-25 DIAGNOSIS — J9601 Acute respiratory failure with hypoxia: Secondary | ICD-10-CM | POA: Diagnosis not present

## 2023-11-25 DIAGNOSIS — C774 Secondary and unspecified malignant neoplasm of inguinal and lower limb lymph nodes: Secondary | ICD-10-CM | POA: Diagnosis not present

## 2023-11-25 DIAGNOSIS — F32A Depression, unspecified: Secondary | ICD-10-CM | POA: Diagnosis not present

## 2023-11-26 DIAGNOSIS — C774 Secondary and unspecified malignant neoplasm of inguinal and lower limb lymph nodes: Secondary | ICD-10-CM | POA: Diagnosis not present

## 2023-11-26 DIAGNOSIS — F32A Depression, unspecified: Secondary | ICD-10-CM | POA: Diagnosis not present

## 2023-11-26 DIAGNOSIS — I4891 Unspecified atrial fibrillation: Secondary | ICD-10-CM | POA: Diagnosis not present

## 2023-11-26 DIAGNOSIS — G4733 Obstructive sleep apnea (adult) (pediatric): Secondary | ICD-10-CM | POA: Diagnosis not present

## 2023-11-26 DIAGNOSIS — C787 Secondary malignant neoplasm of liver and intrahepatic bile duct: Secondary | ICD-10-CM | POA: Diagnosis not present

## 2023-11-26 DIAGNOSIS — C7951 Secondary malignant neoplasm of bone: Secondary | ICD-10-CM | POA: Diagnosis not present

## 2023-11-26 DIAGNOSIS — J9601 Acute respiratory failure with hypoxia: Secondary | ICD-10-CM | POA: Diagnosis not present

## 2023-11-26 DIAGNOSIS — J449 Chronic obstructive pulmonary disease, unspecified: Secondary | ICD-10-CM | POA: Diagnosis not present

## 2023-11-26 DIAGNOSIS — I2510 Atherosclerotic heart disease of native coronary artery without angina pectoris: Secondary | ICD-10-CM | POA: Diagnosis not present

## 2023-11-26 DIAGNOSIS — I509 Heart failure, unspecified: Secondary | ICD-10-CM | POA: Diagnosis not present

## 2023-11-26 DIAGNOSIS — K219 Gastro-esophageal reflux disease without esophagitis: Secondary | ICD-10-CM | POA: Diagnosis not present

## 2023-11-27 DIAGNOSIS — G4733 Obstructive sleep apnea (adult) (pediatric): Secondary | ICD-10-CM | POA: Diagnosis not present

## 2023-11-27 DIAGNOSIS — C7951 Secondary malignant neoplasm of bone: Secondary | ICD-10-CM | POA: Diagnosis not present

## 2023-11-27 DIAGNOSIS — J449 Chronic obstructive pulmonary disease, unspecified: Secondary | ICD-10-CM | POA: Diagnosis not present

## 2023-11-27 DIAGNOSIS — K219 Gastro-esophageal reflux disease without esophagitis: Secondary | ICD-10-CM | POA: Diagnosis not present

## 2023-11-27 DIAGNOSIS — C774 Secondary and unspecified malignant neoplasm of inguinal and lower limb lymph nodes: Secondary | ICD-10-CM | POA: Diagnosis not present

## 2023-11-27 DIAGNOSIS — J9601 Acute respiratory failure with hypoxia: Secondary | ICD-10-CM | POA: Diagnosis not present

## 2023-11-27 DIAGNOSIS — I4891 Unspecified atrial fibrillation: Secondary | ICD-10-CM | POA: Diagnosis not present

## 2023-11-27 DIAGNOSIS — I509 Heart failure, unspecified: Secondary | ICD-10-CM | POA: Diagnosis not present

## 2023-11-27 DIAGNOSIS — C787 Secondary malignant neoplasm of liver and intrahepatic bile duct: Secondary | ICD-10-CM | POA: Diagnosis not present

## 2023-11-27 DIAGNOSIS — I2510 Atherosclerotic heart disease of native coronary artery without angina pectoris: Secondary | ICD-10-CM | POA: Diagnosis not present

## 2023-11-27 DIAGNOSIS — F32A Depression, unspecified: Secondary | ICD-10-CM | POA: Diagnosis not present

## 2023-11-28 DIAGNOSIS — I2510 Atherosclerotic heart disease of native coronary artery without angina pectoris: Secondary | ICD-10-CM | POA: Diagnosis not present

## 2023-11-28 DIAGNOSIS — J9601 Acute respiratory failure with hypoxia: Secondary | ICD-10-CM | POA: Diagnosis not present

## 2023-11-28 DIAGNOSIS — K219 Gastro-esophageal reflux disease without esophagitis: Secondary | ICD-10-CM | POA: Diagnosis not present

## 2023-11-28 DIAGNOSIS — J449 Chronic obstructive pulmonary disease, unspecified: Secondary | ICD-10-CM | POA: Diagnosis not present

## 2023-11-28 DIAGNOSIS — I4891 Unspecified atrial fibrillation: Secondary | ICD-10-CM | POA: Diagnosis not present

## 2023-11-28 DIAGNOSIS — F32A Depression, unspecified: Secondary | ICD-10-CM | POA: Diagnosis not present

## 2023-11-28 DIAGNOSIS — I509 Heart failure, unspecified: Secondary | ICD-10-CM | POA: Diagnosis not present

## 2023-11-28 DIAGNOSIS — G4733 Obstructive sleep apnea (adult) (pediatric): Secondary | ICD-10-CM | POA: Diagnosis not present

## 2023-11-28 DIAGNOSIS — C3431 Malignant neoplasm of lower lobe, right bronchus or lung: Secondary | ICD-10-CM | POA: Diagnosis not present

## 2023-11-28 DIAGNOSIS — C7951 Secondary malignant neoplasm of bone: Secondary | ICD-10-CM | POA: Diagnosis not present

## 2023-11-28 DIAGNOSIS — C787 Secondary malignant neoplasm of liver and intrahepatic bile duct: Secondary | ICD-10-CM | POA: Diagnosis not present

## 2023-11-28 DIAGNOSIS — C774 Secondary and unspecified malignant neoplasm of inguinal and lower limb lymph nodes: Secondary | ICD-10-CM | POA: Diagnosis not present

## 2023-11-29 DIAGNOSIS — K219 Gastro-esophageal reflux disease without esophagitis: Secondary | ICD-10-CM | POA: Diagnosis not present

## 2023-11-29 DIAGNOSIS — C774 Secondary and unspecified malignant neoplasm of inguinal and lower limb lymph nodes: Secondary | ICD-10-CM | POA: Diagnosis not present

## 2023-11-29 DIAGNOSIS — J9601 Acute respiratory failure with hypoxia: Secondary | ICD-10-CM | POA: Diagnosis not present

## 2023-11-29 DIAGNOSIS — I509 Heart failure, unspecified: Secondary | ICD-10-CM | POA: Diagnosis not present

## 2023-11-29 DIAGNOSIS — I2510 Atherosclerotic heart disease of native coronary artery without angina pectoris: Secondary | ICD-10-CM | POA: Diagnosis not present

## 2023-11-29 DIAGNOSIS — C787 Secondary malignant neoplasm of liver and intrahepatic bile duct: Secondary | ICD-10-CM | POA: Diagnosis not present

## 2023-11-29 DIAGNOSIS — J449 Chronic obstructive pulmonary disease, unspecified: Secondary | ICD-10-CM | POA: Diagnosis not present

## 2023-11-29 DIAGNOSIS — I4891 Unspecified atrial fibrillation: Secondary | ICD-10-CM | POA: Diagnosis not present

## 2023-11-29 DIAGNOSIS — F32A Depression, unspecified: Secondary | ICD-10-CM | POA: Diagnosis not present

## 2023-11-29 DIAGNOSIS — G4733 Obstructive sleep apnea (adult) (pediatric): Secondary | ICD-10-CM | POA: Diagnosis not present

## 2023-11-29 DIAGNOSIS — C7951 Secondary malignant neoplasm of bone: Secondary | ICD-10-CM | POA: Diagnosis not present

## 2023-11-29 DIAGNOSIS — C3431 Malignant neoplasm of lower lobe, right bronchus or lung: Secondary | ICD-10-CM | POA: Diagnosis not present

## 2023-11-30 DIAGNOSIS — C774 Secondary and unspecified malignant neoplasm of inguinal and lower limb lymph nodes: Secondary | ICD-10-CM | POA: Diagnosis not present

## 2023-11-30 DIAGNOSIS — I509 Heart failure, unspecified: Secondary | ICD-10-CM | POA: Diagnosis not present

## 2023-11-30 DIAGNOSIS — G4733 Obstructive sleep apnea (adult) (pediatric): Secondary | ICD-10-CM | POA: Diagnosis not present

## 2023-11-30 DIAGNOSIS — C3431 Malignant neoplasm of lower lobe, right bronchus or lung: Secondary | ICD-10-CM | POA: Diagnosis not present

## 2023-11-30 DIAGNOSIS — J449 Chronic obstructive pulmonary disease, unspecified: Secondary | ICD-10-CM | POA: Diagnosis not present

## 2023-11-30 DIAGNOSIS — C787 Secondary malignant neoplasm of liver and intrahepatic bile duct: Secondary | ICD-10-CM | POA: Diagnosis not present

## 2023-11-30 DIAGNOSIS — I4891 Unspecified atrial fibrillation: Secondary | ICD-10-CM | POA: Diagnosis not present

## 2023-11-30 DIAGNOSIS — I2510 Atherosclerotic heart disease of native coronary artery without angina pectoris: Secondary | ICD-10-CM | POA: Diagnosis not present

## 2023-11-30 DIAGNOSIS — J9601 Acute respiratory failure with hypoxia: Secondary | ICD-10-CM | POA: Diagnosis not present

## 2023-11-30 DIAGNOSIS — F32A Depression, unspecified: Secondary | ICD-10-CM | POA: Diagnosis not present

## 2023-11-30 DIAGNOSIS — K219 Gastro-esophageal reflux disease without esophagitis: Secondary | ICD-10-CM | POA: Diagnosis not present

## 2023-11-30 DIAGNOSIS — C7951 Secondary malignant neoplasm of bone: Secondary | ICD-10-CM | POA: Diagnosis not present

## 2023-12-01 DIAGNOSIS — C3431 Malignant neoplasm of lower lobe, right bronchus or lung: Secondary | ICD-10-CM | POA: Diagnosis not present

## 2023-12-01 DIAGNOSIS — I509 Heart failure, unspecified: Secondary | ICD-10-CM | POA: Diagnosis not present

## 2023-12-01 DIAGNOSIS — J449 Chronic obstructive pulmonary disease, unspecified: Secondary | ICD-10-CM | POA: Diagnosis not present

## 2023-12-01 DIAGNOSIS — I2510 Atherosclerotic heart disease of native coronary artery without angina pectoris: Secondary | ICD-10-CM | POA: Diagnosis not present

## 2023-12-01 DIAGNOSIS — I4891 Unspecified atrial fibrillation: Secondary | ICD-10-CM | POA: Diagnosis not present

## 2023-12-01 DIAGNOSIS — K219 Gastro-esophageal reflux disease without esophagitis: Secondary | ICD-10-CM | POA: Diagnosis not present

## 2023-12-01 DIAGNOSIS — J9601 Acute respiratory failure with hypoxia: Secondary | ICD-10-CM | POA: Diagnosis not present

## 2023-12-01 DIAGNOSIS — C787 Secondary malignant neoplasm of liver and intrahepatic bile duct: Secondary | ICD-10-CM | POA: Diagnosis not present

## 2023-12-01 DIAGNOSIS — C774 Secondary and unspecified malignant neoplasm of inguinal and lower limb lymph nodes: Secondary | ICD-10-CM | POA: Diagnosis not present

## 2023-12-01 DIAGNOSIS — C7951 Secondary malignant neoplasm of bone: Secondary | ICD-10-CM | POA: Diagnosis not present

## 2023-12-01 DIAGNOSIS — G4733 Obstructive sleep apnea (adult) (pediatric): Secondary | ICD-10-CM | POA: Diagnosis not present

## 2023-12-01 DIAGNOSIS — F32A Depression, unspecified: Secondary | ICD-10-CM | POA: Diagnosis not present

## 2023-12-02 DIAGNOSIS — J449 Chronic obstructive pulmonary disease, unspecified: Secondary | ICD-10-CM | POA: Diagnosis not present

## 2023-12-02 DIAGNOSIS — C7951 Secondary malignant neoplasm of bone: Secondary | ICD-10-CM | POA: Diagnosis not present

## 2023-12-02 DIAGNOSIS — G4733 Obstructive sleep apnea (adult) (pediatric): Secondary | ICD-10-CM | POA: Diagnosis not present

## 2023-12-02 DIAGNOSIS — C774 Secondary and unspecified malignant neoplasm of inguinal and lower limb lymph nodes: Secondary | ICD-10-CM | POA: Diagnosis not present

## 2023-12-02 DIAGNOSIS — F32A Depression, unspecified: Secondary | ICD-10-CM | POA: Diagnosis not present

## 2023-12-02 DIAGNOSIS — C787 Secondary malignant neoplasm of liver and intrahepatic bile duct: Secondary | ICD-10-CM | POA: Diagnosis not present

## 2023-12-02 DIAGNOSIS — K219 Gastro-esophageal reflux disease without esophagitis: Secondary | ICD-10-CM | POA: Diagnosis not present

## 2023-12-02 DIAGNOSIS — C3431 Malignant neoplasm of lower lobe, right bronchus or lung: Secondary | ICD-10-CM | POA: Diagnosis not present

## 2023-12-02 DIAGNOSIS — I2510 Atherosclerotic heart disease of native coronary artery without angina pectoris: Secondary | ICD-10-CM | POA: Diagnosis not present

## 2023-12-02 DIAGNOSIS — I4891 Unspecified atrial fibrillation: Secondary | ICD-10-CM | POA: Diagnosis not present

## 2023-12-02 DIAGNOSIS — I509 Heart failure, unspecified: Secondary | ICD-10-CM | POA: Diagnosis not present

## 2023-12-02 DIAGNOSIS — J9601 Acute respiratory failure with hypoxia: Secondary | ICD-10-CM | POA: Diagnosis not present

## 2023-12-03 DIAGNOSIS — G4733 Obstructive sleep apnea (adult) (pediatric): Secondary | ICD-10-CM | POA: Diagnosis not present

## 2023-12-03 DIAGNOSIS — C787 Secondary malignant neoplasm of liver and intrahepatic bile duct: Secondary | ICD-10-CM | POA: Diagnosis not present

## 2023-12-03 DIAGNOSIS — J9601 Acute respiratory failure with hypoxia: Secondary | ICD-10-CM | POA: Diagnosis not present

## 2023-12-03 DIAGNOSIS — I509 Heart failure, unspecified: Secondary | ICD-10-CM | POA: Diagnosis not present

## 2023-12-03 DIAGNOSIS — I2510 Atherosclerotic heart disease of native coronary artery without angina pectoris: Secondary | ICD-10-CM | POA: Diagnosis not present

## 2023-12-03 DIAGNOSIS — F32A Depression, unspecified: Secondary | ICD-10-CM | POA: Diagnosis not present

## 2023-12-03 DIAGNOSIS — J449 Chronic obstructive pulmonary disease, unspecified: Secondary | ICD-10-CM | POA: Diagnosis not present

## 2023-12-03 DIAGNOSIS — I4891 Unspecified atrial fibrillation: Secondary | ICD-10-CM | POA: Diagnosis not present

## 2023-12-03 DIAGNOSIS — K219 Gastro-esophageal reflux disease without esophagitis: Secondary | ICD-10-CM | POA: Diagnosis not present

## 2023-12-03 DIAGNOSIS — C774 Secondary and unspecified malignant neoplasm of inguinal and lower limb lymph nodes: Secondary | ICD-10-CM | POA: Diagnosis not present

## 2023-12-03 DIAGNOSIS — C7951 Secondary malignant neoplasm of bone: Secondary | ICD-10-CM | POA: Diagnosis not present

## 2023-12-04 ENCOUNTER — Other Ambulatory Visit: Payer: Self-pay | Admitting: Internal Medicine

## 2023-12-04 ENCOUNTER — Other Ambulatory Visit: Payer: Self-pay | Admitting: Cardiology

## 2023-12-04 DIAGNOSIS — I509 Heart failure, unspecified: Secondary | ICD-10-CM | POA: Diagnosis not present

## 2023-12-04 DIAGNOSIS — K219 Gastro-esophageal reflux disease without esophagitis: Secondary | ICD-10-CM | POA: Diagnosis not present

## 2023-12-04 DIAGNOSIS — C787 Secondary malignant neoplasm of liver and intrahepatic bile duct: Secondary | ICD-10-CM | POA: Diagnosis not present

## 2023-12-04 DIAGNOSIS — C7951 Secondary malignant neoplasm of bone: Secondary | ICD-10-CM | POA: Diagnosis not present

## 2023-12-04 DIAGNOSIS — F32A Depression, unspecified: Secondary | ICD-10-CM | POA: Diagnosis not present

## 2023-12-04 DIAGNOSIS — C774 Secondary and unspecified malignant neoplasm of inguinal and lower limb lymph nodes: Secondary | ICD-10-CM | POA: Diagnosis not present

## 2023-12-04 DIAGNOSIS — I2510 Atherosclerotic heart disease of native coronary artery without angina pectoris: Secondary | ICD-10-CM | POA: Diagnosis not present

## 2023-12-04 DIAGNOSIS — C3431 Malignant neoplasm of lower lobe, right bronchus or lung: Secondary | ICD-10-CM | POA: Diagnosis not present

## 2023-12-04 DIAGNOSIS — J9601 Acute respiratory failure with hypoxia: Secondary | ICD-10-CM | POA: Diagnosis not present

## 2023-12-04 DIAGNOSIS — I4891 Unspecified atrial fibrillation: Secondary | ICD-10-CM | POA: Diagnosis not present

## 2023-12-04 DIAGNOSIS — G4733 Obstructive sleep apnea (adult) (pediatric): Secondary | ICD-10-CM | POA: Diagnosis not present

## 2023-12-04 DIAGNOSIS — J449 Chronic obstructive pulmonary disease, unspecified: Secondary | ICD-10-CM | POA: Diagnosis not present

## 2023-12-05 DIAGNOSIS — I509 Heart failure, unspecified: Secondary | ICD-10-CM | POA: Diagnosis not present

## 2023-12-05 DIAGNOSIS — C774 Secondary and unspecified malignant neoplasm of inguinal and lower limb lymph nodes: Secondary | ICD-10-CM | POA: Diagnosis not present

## 2023-12-05 DIAGNOSIS — J449 Chronic obstructive pulmonary disease, unspecified: Secondary | ICD-10-CM | POA: Diagnosis not present

## 2023-12-05 DIAGNOSIS — C3431 Malignant neoplasm of lower lobe, right bronchus or lung: Secondary | ICD-10-CM | POA: Diagnosis not present

## 2023-12-05 DIAGNOSIS — I4891 Unspecified atrial fibrillation: Secondary | ICD-10-CM | POA: Diagnosis not present

## 2023-12-05 DIAGNOSIS — G4733 Obstructive sleep apnea (adult) (pediatric): Secondary | ICD-10-CM | POA: Diagnosis not present

## 2023-12-05 DIAGNOSIS — K219 Gastro-esophageal reflux disease without esophagitis: Secondary | ICD-10-CM | POA: Diagnosis not present

## 2023-12-05 DIAGNOSIS — I2510 Atherosclerotic heart disease of native coronary artery without angina pectoris: Secondary | ICD-10-CM | POA: Diagnosis not present

## 2023-12-05 DIAGNOSIS — F32A Depression, unspecified: Secondary | ICD-10-CM | POA: Diagnosis not present

## 2023-12-05 DIAGNOSIS — C787 Secondary malignant neoplasm of liver and intrahepatic bile duct: Secondary | ICD-10-CM | POA: Diagnosis not present

## 2023-12-05 DIAGNOSIS — C7951 Secondary malignant neoplasm of bone: Secondary | ICD-10-CM | POA: Diagnosis not present

## 2023-12-05 DIAGNOSIS — J9601 Acute respiratory failure with hypoxia: Secondary | ICD-10-CM | POA: Diagnosis not present

## 2023-12-06 DIAGNOSIS — I509 Heart failure, unspecified: Secondary | ICD-10-CM | POA: Diagnosis not present

## 2023-12-06 DIAGNOSIS — C7951 Secondary malignant neoplasm of bone: Secondary | ICD-10-CM | POA: Diagnosis not present

## 2023-12-06 DIAGNOSIS — C774 Secondary and unspecified malignant neoplasm of inguinal and lower limb lymph nodes: Secondary | ICD-10-CM | POA: Diagnosis not present

## 2023-12-06 DIAGNOSIS — G4733 Obstructive sleep apnea (adult) (pediatric): Secondary | ICD-10-CM | POA: Diagnosis not present

## 2023-12-06 DIAGNOSIS — K219 Gastro-esophageal reflux disease without esophagitis: Secondary | ICD-10-CM | POA: Diagnosis not present

## 2023-12-06 DIAGNOSIS — J9601 Acute respiratory failure with hypoxia: Secondary | ICD-10-CM | POA: Diagnosis not present

## 2023-12-06 DIAGNOSIS — J449 Chronic obstructive pulmonary disease, unspecified: Secondary | ICD-10-CM | POA: Diagnosis not present

## 2023-12-06 DIAGNOSIS — C3431 Malignant neoplasm of lower lobe, right bronchus or lung: Secondary | ICD-10-CM | POA: Diagnosis not present

## 2023-12-06 DIAGNOSIS — C787 Secondary malignant neoplasm of liver and intrahepatic bile duct: Secondary | ICD-10-CM | POA: Diagnosis not present

## 2023-12-06 DIAGNOSIS — I4891 Unspecified atrial fibrillation: Secondary | ICD-10-CM | POA: Diagnosis not present

## 2023-12-06 DIAGNOSIS — I2510 Atherosclerotic heart disease of native coronary artery without angina pectoris: Secondary | ICD-10-CM | POA: Diagnosis not present

## 2023-12-06 DIAGNOSIS — F32A Depression, unspecified: Secondary | ICD-10-CM | POA: Diagnosis not present

## 2023-12-07 DIAGNOSIS — J9601 Acute respiratory failure with hypoxia: Secondary | ICD-10-CM | POA: Diagnosis not present

## 2023-12-07 DIAGNOSIS — C7951 Secondary malignant neoplasm of bone: Secondary | ICD-10-CM | POA: Diagnosis not present

## 2023-12-07 DIAGNOSIS — G4733 Obstructive sleep apnea (adult) (pediatric): Secondary | ICD-10-CM | POA: Diagnosis not present

## 2023-12-07 DIAGNOSIS — C3431 Malignant neoplasm of lower lobe, right bronchus or lung: Secondary | ICD-10-CM | POA: Diagnosis not present

## 2023-12-07 DIAGNOSIS — I509 Heart failure, unspecified: Secondary | ICD-10-CM | POA: Diagnosis not present

## 2023-12-07 DIAGNOSIS — I4891 Unspecified atrial fibrillation: Secondary | ICD-10-CM | POA: Diagnosis not present

## 2023-12-07 DIAGNOSIS — F32A Depression, unspecified: Secondary | ICD-10-CM | POA: Diagnosis not present

## 2023-12-07 DIAGNOSIS — C787 Secondary malignant neoplasm of liver and intrahepatic bile duct: Secondary | ICD-10-CM | POA: Diagnosis not present

## 2023-12-07 DIAGNOSIS — I2510 Atherosclerotic heart disease of native coronary artery without angina pectoris: Secondary | ICD-10-CM | POA: Diagnosis not present

## 2023-12-07 DIAGNOSIS — K219 Gastro-esophageal reflux disease without esophagitis: Secondary | ICD-10-CM | POA: Diagnosis not present

## 2023-12-07 DIAGNOSIS — C774 Secondary and unspecified malignant neoplasm of inguinal and lower limb lymph nodes: Secondary | ICD-10-CM | POA: Diagnosis not present

## 2023-12-07 DIAGNOSIS — J449 Chronic obstructive pulmonary disease, unspecified: Secondary | ICD-10-CM | POA: Diagnosis not present

## 2023-12-08 DIAGNOSIS — F32A Depression, unspecified: Secondary | ICD-10-CM | POA: Diagnosis not present

## 2023-12-08 DIAGNOSIS — G4733 Obstructive sleep apnea (adult) (pediatric): Secondary | ICD-10-CM | POA: Diagnosis not present

## 2023-12-08 DIAGNOSIS — C787 Secondary malignant neoplasm of liver and intrahepatic bile duct: Secondary | ICD-10-CM | POA: Diagnosis not present

## 2023-12-08 DIAGNOSIS — J449 Chronic obstructive pulmonary disease, unspecified: Secondary | ICD-10-CM | POA: Diagnosis not present

## 2023-12-08 DIAGNOSIS — J9601 Acute respiratory failure with hypoxia: Secondary | ICD-10-CM | POA: Diagnosis not present

## 2023-12-08 DIAGNOSIS — C774 Secondary and unspecified malignant neoplasm of inguinal and lower limb lymph nodes: Secondary | ICD-10-CM | POA: Diagnosis not present

## 2023-12-08 DIAGNOSIS — I509 Heart failure, unspecified: Secondary | ICD-10-CM | POA: Diagnosis not present

## 2023-12-08 DIAGNOSIS — I2510 Atherosclerotic heart disease of native coronary artery without angina pectoris: Secondary | ICD-10-CM | POA: Diagnosis not present

## 2023-12-08 DIAGNOSIS — C3431 Malignant neoplasm of lower lobe, right bronchus or lung: Secondary | ICD-10-CM | POA: Diagnosis not present

## 2023-12-08 DIAGNOSIS — I4891 Unspecified atrial fibrillation: Secondary | ICD-10-CM | POA: Diagnosis not present

## 2023-12-08 DIAGNOSIS — K219 Gastro-esophageal reflux disease without esophagitis: Secondary | ICD-10-CM | POA: Diagnosis not present

## 2023-12-08 DIAGNOSIS — C7951 Secondary malignant neoplasm of bone: Secondary | ICD-10-CM | POA: Diagnosis not present

## 2023-12-09 DIAGNOSIS — F32A Depression, unspecified: Secondary | ICD-10-CM | POA: Diagnosis not present

## 2023-12-09 DIAGNOSIS — C774 Secondary and unspecified malignant neoplasm of inguinal and lower limb lymph nodes: Secondary | ICD-10-CM | POA: Diagnosis not present

## 2023-12-09 DIAGNOSIS — I509 Heart failure, unspecified: Secondary | ICD-10-CM | POA: Diagnosis not present

## 2023-12-09 DIAGNOSIS — C3431 Malignant neoplasm of lower lobe, right bronchus or lung: Secondary | ICD-10-CM | POA: Diagnosis not present

## 2023-12-09 DIAGNOSIS — I2510 Atherosclerotic heart disease of native coronary artery without angina pectoris: Secondary | ICD-10-CM | POA: Diagnosis not present

## 2023-12-09 DIAGNOSIS — K219 Gastro-esophageal reflux disease without esophagitis: Secondary | ICD-10-CM | POA: Diagnosis not present

## 2023-12-09 DIAGNOSIS — J9601 Acute respiratory failure with hypoxia: Secondary | ICD-10-CM | POA: Diagnosis not present

## 2023-12-09 DIAGNOSIS — J449 Chronic obstructive pulmonary disease, unspecified: Secondary | ICD-10-CM | POA: Diagnosis not present

## 2023-12-09 DIAGNOSIS — C7951 Secondary malignant neoplasm of bone: Secondary | ICD-10-CM | POA: Diagnosis not present

## 2023-12-09 DIAGNOSIS — C787 Secondary malignant neoplasm of liver and intrahepatic bile duct: Secondary | ICD-10-CM | POA: Diagnosis not present

## 2023-12-09 DIAGNOSIS — I4891 Unspecified atrial fibrillation: Secondary | ICD-10-CM | POA: Diagnosis not present

## 2023-12-09 DIAGNOSIS — G4733 Obstructive sleep apnea (adult) (pediatric): Secondary | ICD-10-CM | POA: Diagnosis not present

## 2023-12-10 DIAGNOSIS — J449 Chronic obstructive pulmonary disease, unspecified: Secondary | ICD-10-CM | POA: Diagnosis not present

## 2023-12-10 DIAGNOSIS — K219 Gastro-esophageal reflux disease without esophagitis: Secondary | ICD-10-CM | POA: Diagnosis not present

## 2023-12-10 DIAGNOSIS — J9601 Acute respiratory failure with hypoxia: Secondary | ICD-10-CM | POA: Diagnosis not present

## 2023-12-10 DIAGNOSIS — C787 Secondary malignant neoplasm of liver and intrahepatic bile duct: Secondary | ICD-10-CM | POA: Diagnosis not present

## 2023-12-10 DIAGNOSIS — I2510 Atherosclerotic heart disease of native coronary artery without angina pectoris: Secondary | ICD-10-CM | POA: Diagnosis not present

## 2023-12-10 DIAGNOSIS — I4891 Unspecified atrial fibrillation: Secondary | ICD-10-CM | POA: Diagnosis not present

## 2023-12-10 DIAGNOSIS — C774 Secondary and unspecified malignant neoplasm of inguinal and lower limb lymph nodes: Secondary | ICD-10-CM | POA: Diagnosis not present

## 2023-12-10 DIAGNOSIS — G4733 Obstructive sleep apnea (adult) (pediatric): Secondary | ICD-10-CM | POA: Diagnosis not present

## 2023-12-10 DIAGNOSIS — C7951 Secondary malignant neoplasm of bone: Secondary | ICD-10-CM | POA: Diagnosis not present

## 2023-12-10 DIAGNOSIS — F32A Depression, unspecified: Secondary | ICD-10-CM | POA: Diagnosis not present

## 2023-12-10 DIAGNOSIS — I509 Heart failure, unspecified: Secondary | ICD-10-CM | POA: Diagnosis not present

## 2023-12-11 DIAGNOSIS — C3431 Malignant neoplasm of lower lobe, right bronchus or lung: Secondary | ICD-10-CM | POA: Diagnosis not present

## 2023-12-11 DIAGNOSIS — I509 Heart failure, unspecified: Secondary | ICD-10-CM | POA: Diagnosis not present

## 2023-12-11 DIAGNOSIS — I2510 Atherosclerotic heart disease of native coronary artery without angina pectoris: Secondary | ICD-10-CM | POA: Diagnosis not present

## 2023-12-11 DIAGNOSIS — C787 Secondary malignant neoplasm of liver and intrahepatic bile duct: Secondary | ICD-10-CM | POA: Diagnosis not present

## 2023-12-11 DIAGNOSIS — C7951 Secondary malignant neoplasm of bone: Secondary | ICD-10-CM | POA: Diagnosis not present

## 2023-12-11 DIAGNOSIS — J9601 Acute respiratory failure with hypoxia: Secondary | ICD-10-CM | POA: Diagnosis not present

## 2023-12-11 DIAGNOSIS — G4733 Obstructive sleep apnea (adult) (pediatric): Secondary | ICD-10-CM | POA: Diagnosis not present

## 2023-12-11 DIAGNOSIS — J449 Chronic obstructive pulmonary disease, unspecified: Secondary | ICD-10-CM | POA: Diagnosis not present

## 2023-12-11 DIAGNOSIS — F32A Depression, unspecified: Secondary | ICD-10-CM | POA: Diagnosis not present

## 2023-12-11 DIAGNOSIS — C774 Secondary and unspecified malignant neoplasm of inguinal and lower limb lymph nodes: Secondary | ICD-10-CM | POA: Diagnosis not present

## 2023-12-11 DIAGNOSIS — K219 Gastro-esophageal reflux disease without esophagitis: Secondary | ICD-10-CM | POA: Diagnosis not present

## 2023-12-11 DIAGNOSIS — I4891 Unspecified atrial fibrillation: Secondary | ICD-10-CM | POA: Diagnosis not present

## 2023-12-12 DIAGNOSIS — K219 Gastro-esophageal reflux disease without esophagitis: Secondary | ICD-10-CM | POA: Diagnosis not present

## 2023-12-12 DIAGNOSIS — C787 Secondary malignant neoplasm of liver and intrahepatic bile duct: Secondary | ICD-10-CM | POA: Diagnosis not present

## 2023-12-12 DIAGNOSIS — F32A Depression, unspecified: Secondary | ICD-10-CM | POA: Diagnosis not present

## 2023-12-12 DIAGNOSIS — C774 Secondary and unspecified malignant neoplasm of inguinal and lower limb lymph nodes: Secondary | ICD-10-CM | POA: Diagnosis not present

## 2023-12-12 DIAGNOSIS — I4891 Unspecified atrial fibrillation: Secondary | ICD-10-CM | POA: Diagnosis not present

## 2023-12-12 DIAGNOSIS — C3431 Malignant neoplasm of lower lobe, right bronchus or lung: Secondary | ICD-10-CM | POA: Diagnosis not present

## 2023-12-12 DIAGNOSIS — G4733 Obstructive sleep apnea (adult) (pediatric): Secondary | ICD-10-CM | POA: Diagnosis not present

## 2023-12-12 DIAGNOSIS — J449 Chronic obstructive pulmonary disease, unspecified: Secondary | ICD-10-CM | POA: Diagnosis not present

## 2023-12-12 DIAGNOSIS — I2510 Atherosclerotic heart disease of native coronary artery without angina pectoris: Secondary | ICD-10-CM | POA: Diagnosis not present

## 2023-12-12 DIAGNOSIS — J9601 Acute respiratory failure with hypoxia: Secondary | ICD-10-CM | POA: Diagnosis not present

## 2023-12-12 DIAGNOSIS — I509 Heart failure, unspecified: Secondary | ICD-10-CM | POA: Diagnosis not present

## 2023-12-12 DIAGNOSIS — C7951 Secondary malignant neoplasm of bone: Secondary | ICD-10-CM | POA: Diagnosis not present

## 2023-12-13 DIAGNOSIS — I2510 Atherosclerotic heart disease of native coronary artery without angina pectoris: Secondary | ICD-10-CM | POA: Diagnosis not present

## 2023-12-13 DIAGNOSIS — F32A Depression, unspecified: Secondary | ICD-10-CM | POA: Diagnosis not present

## 2023-12-13 DIAGNOSIS — I4891 Unspecified atrial fibrillation: Secondary | ICD-10-CM | POA: Diagnosis not present

## 2023-12-13 DIAGNOSIS — C774 Secondary and unspecified malignant neoplasm of inguinal and lower limb lymph nodes: Secondary | ICD-10-CM | POA: Diagnosis not present

## 2023-12-13 DIAGNOSIS — J449 Chronic obstructive pulmonary disease, unspecified: Secondary | ICD-10-CM | POA: Diagnosis not present

## 2023-12-13 DIAGNOSIS — J9601 Acute respiratory failure with hypoxia: Secondary | ICD-10-CM | POA: Diagnosis not present

## 2023-12-13 DIAGNOSIS — I509 Heart failure, unspecified: Secondary | ICD-10-CM | POA: Diagnosis not present

## 2023-12-13 DIAGNOSIS — C7951 Secondary malignant neoplasm of bone: Secondary | ICD-10-CM | POA: Diagnosis not present

## 2023-12-13 DIAGNOSIS — C787 Secondary malignant neoplasm of liver and intrahepatic bile duct: Secondary | ICD-10-CM | POA: Diagnosis not present

## 2023-12-13 DIAGNOSIS — K219 Gastro-esophageal reflux disease without esophagitis: Secondary | ICD-10-CM | POA: Diagnosis not present

## 2023-12-13 DIAGNOSIS — G4733 Obstructive sleep apnea (adult) (pediatric): Secondary | ICD-10-CM | POA: Diagnosis not present

## 2023-12-13 DIAGNOSIS — C3431 Malignant neoplasm of lower lobe, right bronchus or lung: Secondary | ICD-10-CM | POA: Diagnosis not present

## 2023-12-14 DIAGNOSIS — F32A Depression, unspecified: Secondary | ICD-10-CM | POA: Diagnosis not present

## 2023-12-14 DIAGNOSIS — C3431 Malignant neoplasm of lower lobe, right bronchus or lung: Secondary | ICD-10-CM | POA: Diagnosis not present

## 2023-12-14 DIAGNOSIS — I4891 Unspecified atrial fibrillation: Secondary | ICD-10-CM | POA: Diagnosis not present

## 2023-12-14 DIAGNOSIS — K219 Gastro-esophageal reflux disease without esophagitis: Secondary | ICD-10-CM | POA: Diagnosis not present

## 2023-12-14 DIAGNOSIS — C787 Secondary malignant neoplasm of liver and intrahepatic bile duct: Secondary | ICD-10-CM | POA: Diagnosis not present

## 2023-12-14 DIAGNOSIS — J449 Chronic obstructive pulmonary disease, unspecified: Secondary | ICD-10-CM | POA: Diagnosis not present

## 2023-12-14 DIAGNOSIS — C7951 Secondary malignant neoplasm of bone: Secondary | ICD-10-CM | POA: Diagnosis not present

## 2023-12-14 DIAGNOSIS — I2510 Atherosclerotic heart disease of native coronary artery without angina pectoris: Secondary | ICD-10-CM | POA: Diagnosis not present

## 2023-12-14 DIAGNOSIS — I509 Heart failure, unspecified: Secondary | ICD-10-CM | POA: Diagnosis not present

## 2023-12-14 DIAGNOSIS — C774 Secondary and unspecified malignant neoplasm of inguinal and lower limb lymph nodes: Secondary | ICD-10-CM | POA: Diagnosis not present

## 2023-12-14 DIAGNOSIS — G4733 Obstructive sleep apnea (adult) (pediatric): Secondary | ICD-10-CM | POA: Diagnosis not present

## 2023-12-14 DIAGNOSIS — J9601 Acute respiratory failure with hypoxia: Secondary | ICD-10-CM | POA: Diagnosis not present

## 2023-12-15 DIAGNOSIS — C774 Secondary and unspecified malignant neoplasm of inguinal and lower limb lymph nodes: Secondary | ICD-10-CM | POA: Diagnosis not present

## 2023-12-15 DIAGNOSIS — J449 Chronic obstructive pulmonary disease, unspecified: Secondary | ICD-10-CM | POA: Diagnosis not present

## 2023-12-15 DIAGNOSIS — F32A Depression, unspecified: Secondary | ICD-10-CM | POA: Diagnosis not present

## 2023-12-15 DIAGNOSIS — I2510 Atherosclerotic heart disease of native coronary artery without angina pectoris: Secondary | ICD-10-CM | POA: Diagnosis not present

## 2023-12-15 DIAGNOSIS — I509 Heart failure, unspecified: Secondary | ICD-10-CM | POA: Diagnosis not present

## 2023-12-15 DIAGNOSIS — K219 Gastro-esophageal reflux disease without esophagitis: Secondary | ICD-10-CM | POA: Diagnosis not present

## 2023-12-15 DIAGNOSIS — C3431 Malignant neoplasm of lower lobe, right bronchus or lung: Secondary | ICD-10-CM | POA: Diagnosis not present

## 2023-12-15 DIAGNOSIS — I4891 Unspecified atrial fibrillation: Secondary | ICD-10-CM | POA: Diagnosis not present

## 2023-12-15 DIAGNOSIS — J9601 Acute respiratory failure with hypoxia: Secondary | ICD-10-CM | POA: Diagnosis not present

## 2023-12-15 DIAGNOSIS — C7951 Secondary malignant neoplasm of bone: Secondary | ICD-10-CM | POA: Diagnosis not present

## 2023-12-15 DIAGNOSIS — G4733 Obstructive sleep apnea (adult) (pediatric): Secondary | ICD-10-CM | POA: Diagnosis not present

## 2023-12-15 DIAGNOSIS — C787 Secondary malignant neoplasm of liver and intrahepatic bile duct: Secondary | ICD-10-CM | POA: Diagnosis not present

## 2023-12-16 DIAGNOSIS — I4891 Unspecified atrial fibrillation: Secondary | ICD-10-CM | POA: Diagnosis not present

## 2023-12-16 DIAGNOSIS — F32A Depression, unspecified: Secondary | ICD-10-CM | POA: Diagnosis not present

## 2023-12-16 DIAGNOSIS — J9601 Acute respiratory failure with hypoxia: Secondary | ICD-10-CM | POA: Diagnosis not present

## 2023-12-16 DIAGNOSIS — I2510 Atherosclerotic heart disease of native coronary artery without angina pectoris: Secondary | ICD-10-CM | POA: Diagnosis not present

## 2023-12-16 DIAGNOSIS — C7951 Secondary malignant neoplasm of bone: Secondary | ICD-10-CM | POA: Diagnosis not present

## 2023-12-16 DIAGNOSIS — C774 Secondary and unspecified malignant neoplasm of inguinal and lower limb lymph nodes: Secondary | ICD-10-CM | POA: Diagnosis not present

## 2023-12-16 DIAGNOSIS — K219 Gastro-esophageal reflux disease without esophagitis: Secondary | ICD-10-CM | POA: Diagnosis not present

## 2023-12-16 DIAGNOSIS — C3431 Malignant neoplasm of lower lobe, right bronchus or lung: Secondary | ICD-10-CM | POA: Diagnosis not present

## 2023-12-16 DIAGNOSIS — G4733 Obstructive sleep apnea (adult) (pediatric): Secondary | ICD-10-CM | POA: Diagnosis not present

## 2023-12-16 DIAGNOSIS — J449 Chronic obstructive pulmonary disease, unspecified: Secondary | ICD-10-CM | POA: Diagnosis not present

## 2023-12-16 DIAGNOSIS — C787 Secondary malignant neoplasm of liver and intrahepatic bile duct: Secondary | ICD-10-CM | POA: Diagnosis not present

## 2023-12-16 DIAGNOSIS — I509 Heart failure, unspecified: Secondary | ICD-10-CM | POA: Diagnosis not present

## 2023-12-21 ENCOUNTER — Ambulatory Visit: Admit: 2023-12-21 | Payer: Self-pay | Admitting: Internal Medicine

## 2023-12-21 SURGERY — COLONOSCOPY
Anesthesia: General

## 2023-12-27 ENCOUNTER — Inpatient Hospital Stay: Admitting: Hospice and Palliative Medicine

## 2024-01-16 DEATH — deceased

## 2024-03-09 ENCOUNTER — Ambulatory Visit: Admitting: Oncology

## 2024-03-09 ENCOUNTER — Other Ambulatory Visit
